# Patient Record
Sex: Male | Born: 1950 | Race: White | Hispanic: No | Marital: Married | Smoking: Never smoker
Health system: Southern US, Community
[De-identification: ages and names within clinical notes are randomized; demographics above are authoritative.]

## PROBLEM LIST (undated history)

## (undated) DIAGNOSIS — G4700 Insomnia, unspecified: Secondary | ICD-10-CM

## (undated) DIAGNOSIS — F419 Anxiety disorder, unspecified: Secondary | ICD-10-CM

## (undated) DIAGNOSIS — F329 Major depressive disorder, single episode, unspecified: Secondary | ICD-10-CM

## (undated) DIAGNOSIS — D0359 Melanoma in situ of other part of trunk: Secondary | ICD-10-CM

## (undated) DIAGNOSIS — I10 Essential (primary) hypertension: Secondary | ICD-10-CM

## (undated) DIAGNOSIS — D099 Carcinoma in situ, unspecified: Secondary | ICD-10-CM

## (undated) DIAGNOSIS — E222 Syndrome of inappropriate secretion of antidiuretic hormone: Secondary | ICD-10-CM

## (undated) DIAGNOSIS — E871 Hypo-osmolality and hyponatremia: Secondary | ICD-10-CM

## (undated) DIAGNOSIS — F32A Depression, unspecified: Secondary | ICD-10-CM

## (undated) HISTORY — DX: Depression, unspecified: F32.A

## (undated) HISTORY — DX: Syndrome of inappropriate secretion of antidiuretic hormone: E22.2

## (undated) HISTORY — PX: MOHS SURGERY: SUR867

## (undated) HISTORY — DX: Anxiety disorder, unspecified: F41.9

## (undated) HISTORY — DX: Hypo-osmolality and hyponatremia: E87.1

## (undated) HISTORY — DX: Essential (primary) hypertension: I10

## (undated) HISTORY — DX: Carcinoma in situ, unspecified: D09.9

## (undated) HISTORY — DX: Melanoma in situ of other part of trunk: D03.59

## (undated) HISTORY — PX: SHOULDER SURGERY: SHX246

## (undated) HISTORY — DX: Major depressive disorder, single episode, unspecified: F32.9

## (undated) HISTORY — DX: Insomnia, unspecified: G47.00

---

## 2004-08-05 ENCOUNTER — Ambulatory Visit (HOSPITAL_COMMUNITY): Admission: RE | Admit: 2004-08-05 | Discharge: 2004-08-05 | Payer: Self-pay | Admitting: Gastroenterology

## 2004-08-05 ENCOUNTER — Encounter (INDEPENDENT_AMBULATORY_CARE_PROVIDER_SITE_OTHER): Payer: Self-pay | Admitting: Specialist

## 2006-09-04 ENCOUNTER — Ambulatory Visit: Payer: Self-pay | Admitting: Internal Medicine

## 2008-09-08 ENCOUNTER — Telehealth (INDEPENDENT_AMBULATORY_CARE_PROVIDER_SITE_OTHER): Payer: Self-pay | Admitting: *Deleted

## 2008-09-08 ENCOUNTER — Ambulatory Visit: Payer: Self-pay | Admitting: Internal Medicine

## 2008-09-08 DIAGNOSIS — G4700 Insomnia, unspecified: Secondary | ICD-10-CM | POA: Insufficient documentation

## 2008-10-29 ENCOUNTER — Telehealth (INDEPENDENT_AMBULATORY_CARE_PROVIDER_SITE_OTHER): Payer: Self-pay | Admitting: *Deleted

## 2008-12-14 ENCOUNTER — Telehealth (INDEPENDENT_AMBULATORY_CARE_PROVIDER_SITE_OTHER): Payer: Self-pay | Admitting: *Deleted

## 2008-12-30 ENCOUNTER — Ambulatory Visit: Payer: Self-pay | Admitting: Internal Medicine

## 2008-12-30 DIAGNOSIS — L0292 Furuncle, unspecified: Secondary | ICD-10-CM | POA: Insufficient documentation

## 2008-12-30 DIAGNOSIS — L0293 Carbuncle, unspecified: Secondary | ICD-10-CM

## 2009-01-04 ENCOUNTER — Ambulatory Visit: Payer: Self-pay | Admitting: Internal Medicine

## 2009-01-04 DIAGNOSIS — I10 Essential (primary) hypertension: Secondary | ICD-10-CM | POA: Insufficient documentation

## 2009-01-05 ENCOUNTER — Telehealth (INDEPENDENT_AMBULATORY_CARE_PROVIDER_SITE_OTHER): Payer: Self-pay | Admitting: *Deleted

## 2009-01-05 LAB — CONVERTED CEMR LAB
Basophils Absolute: 0 10*3/uL (ref 0.0–0.1)
CO2: 30 meq/L (ref 19–32)
Calcium: 9.5 mg/dL (ref 8.4–10.5)
GFR calc Af Amer: 111 mL/min
GFR calc non Af Amer: 92 mL/min
Hemoglobin: 13.3 g/dL (ref 13.0–17.0)
Lymphocytes Relative: 20.8 % (ref 12.0–46.0)
MCHC: 34.3 g/dL (ref 30.0–36.0)
Neutro Abs: 4.5 10*3/uL (ref 1.4–7.7)
RDW: 12.5 % (ref 11.5–14.6)
Sodium: 134 meq/L — ABNORMAL LOW (ref 135–145)
TSH: 1.04 microintl units/mL (ref 0.35–5.50)

## 2009-01-18 ENCOUNTER — Encounter: Payer: Self-pay | Admitting: Internal Medicine

## 2009-01-29 ENCOUNTER — Encounter (INDEPENDENT_AMBULATORY_CARE_PROVIDER_SITE_OTHER): Payer: Self-pay | Admitting: *Deleted

## 2009-01-29 ENCOUNTER — Ambulatory Visit: Payer: Self-pay | Admitting: Internal Medicine

## 2009-01-30 LAB — CONVERTED CEMR LAB
BUN: 13 mg/dL (ref 6–23)
CO2: 31 meq/L (ref 19–32)
Chloride: 96 meq/L (ref 96–112)
Cholesterol: 181 mg/dL (ref 0–200)
Glucose, Bld: 107 mg/dL — ABNORMAL HIGH (ref 70–99)
PSA: 1.13 ng/mL (ref 0.10–4.00)
Potassium: 4.9 meq/L (ref 3.5–5.1)
VLDL: 10 mg/dL (ref 0–40)

## 2009-02-01 ENCOUNTER — Encounter (INDEPENDENT_AMBULATORY_CARE_PROVIDER_SITE_OTHER): Payer: Self-pay | Admitting: *Deleted

## 2009-02-03 ENCOUNTER — Telehealth: Payer: Self-pay | Admitting: Internal Medicine

## 2009-02-08 ENCOUNTER — Encounter (INDEPENDENT_AMBULATORY_CARE_PROVIDER_SITE_OTHER): Payer: Self-pay | Admitting: *Deleted

## 2009-02-24 ENCOUNTER — Telehealth (INDEPENDENT_AMBULATORY_CARE_PROVIDER_SITE_OTHER): Payer: Self-pay | Admitting: *Deleted

## 2009-03-29 ENCOUNTER — Telehealth (INDEPENDENT_AMBULATORY_CARE_PROVIDER_SITE_OTHER): Payer: Self-pay | Admitting: *Deleted

## 2009-04-01 ENCOUNTER — Ambulatory Visit: Payer: Self-pay | Admitting: Internal Medicine

## 2009-04-01 DIAGNOSIS — F411 Generalized anxiety disorder: Secondary | ICD-10-CM | POA: Insufficient documentation

## 2009-04-27 ENCOUNTER — Ambulatory Visit: Payer: Self-pay | Admitting: Internal Medicine

## 2009-05-05 ENCOUNTER — Telehealth: Payer: Self-pay | Admitting: Internal Medicine

## 2009-06-30 ENCOUNTER — Ambulatory Visit: Payer: Self-pay | Admitting: Internal Medicine

## 2009-08-27 ENCOUNTER — Encounter: Payer: Self-pay | Admitting: Internal Medicine

## 2009-09-06 ENCOUNTER — Telehealth (INDEPENDENT_AMBULATORY_CARE_PROVIDER_SITE_OTHER): Payer: Self-pay | Admitting: *Deleted

## 2009-09-07 ENCOUNTER — Encounter: Payer: Self-pay | Admitting: Internal Medicine

## 2009-12-23 ENCOUNTER — Telehealth (INDEPENDENT_AMBULATORY_CARE_PROVIDER_SITE_OTHER): Payer: Self-pay | Admitting: *Deleted

## 2009-12-24 ENCOUNTER — Encounter (INDEPENDENT_AMBULATORY_CARE_PROVIDER_SITE_OTHER): Payer: Self-pay | Admitting: *Deleted

## 2010-01-24 ENCOUNTER — Encounter (INDEPENDENT_AMBULATORY_CARE_PROVIDER_SITE_OTHER): Payer: Self-pay | Admitting: *Deleted

## 2010-02-11 ENCOUNTER — Ambulatory Visit: Payer: Self-pay | Admitting: Internal Medicine

## 2010-02-14 LAB — CONVERTED CEMR LAB
ALT: 21 units/L (ref 0–53)
Albumin: 4.1 g/dL (ref 3.5–5.2)
Alkaline Phosphatase: 51 units/L (ref 39–117)
Basophils Relative: 0.7 % (ref 0.0–3.0)
CO2: 32 meq/L (ref 19–32)
Chloride: 98 meq/L (ref 96–112)
Eosinophils Absolute: 0.1 10*3/uL (ref 0.0–0.7)
Eosinophils Relative: 2.5 % (ref 0.0–5.0)
HDL: 71.5 mg/dL (ref >39.00)
Hemoglobin: 12.6 g/dL — ABNORMAL LOW (ref 13.0–17.0)
Lymphocytes Relative: 34.1 % (ref 12.0–46.0)
MCHC: 33.6 g/dL (ref 30.0–36.0)
MCV: 95.5 fL (ref 78.0–100.0)
Monocytes Absolute: 0.5 10*3/uL (ref 0.1–1.0)
Neutro Abs: 2.5 10*3/uL (ref 1.4–7.7)
RBC: 3.93 M/uL — ABNORMAL LOW (ref 4.22–5.81)
Sodium: 135 meq/L (ref 135–145)
TSH: 1.46 microintl units/mL (ref 0.35–5.50)
Total Protein: 6.6 g/dL (ref 6.0–8.3)

## 2010-02-15 ENCOUNTER — Ambulatory Visit: Payer: Self-pay | Admitting: Internal Medicine

## 2010-02-22 ENCOUNTER — Ambulatory Visit: Payer: Self-pay | Admitting: Internal Medicine

## 2010-02-24 LAB — CONVERTED CEMR LAB
Ferritin: 82.1 ng/mL (ref 22.0–322.0)
Iron: 92 ug/dL (ref 42–165)
Vitamin B-12: 547 pg/mL (ref 211–911)

## 2010-03-02 ENCOUNTER — Telehealth: Payer: Self-pay | Admitting: Internal Medicine

## 2010-05-06 ENCOUNTER — Telehealth (INDEPENDENT_AMBULATORY_CARE_PROVIDER_SITE_OTHER): Payer: Self-pay | Admitting: *Deleted

## 2010-07-21 ENCOUNTER — Ambulatory Visit: Payer: Self-pay | Admitting: Sports Medicine

## 2010-07-21 DIAGNOSIS — M545 Low back pain, unspecified: Secondary | ICD-10-CM | POA: Insufficient documentation

## 2010-07-21 DIAGNOSIS — M25569 Pain in unspecified knee: Secondary | ICD-10-CM | POA: Insufficient documentation

## 2010-07-21 DIAGNOSIS — M7750 Other enthesopathy of unspecified foot: Secondary | ICD-10-CM | POA: Insufficient documentation

## 2010-07-21 DIAGNOSIS — M25579 Pain in unspecified ankle and joints of unspecified foot: Secondary | ICD-10-CM | POA: Insufficient documentation

## 2010-08-18 ENCOUNTER — Ambulatory Visit: Payer: Self-pay | Admitting: Sports Medicine

## 2010-08-18 DIAGNOSIS — M4120 Other idiopathic scoliosis, site unspecified: Secondary | ICD-10-CM | POA: Insufficient documentation

## 2010-10-05 ENCOUNTER — Telehealth: Payer: Self-pay | Admitting: Internal Medicine

## 2010-11-04 ENCOUNTER — Telehealth: Payer: Self-pay | Admitting: Internal Medicine

## 2010-12-21 NOTE — Progress Notes (Signed)
Summary: Refill  Phone Note Refill Request Message from:  Fax from Pharmacy on October 05, 2010 4:11 PM  Refills Requested: Medication #1:  AMBIEN 10 MG TABS 1 at bedtime as needed [facility] - fax [phone] --- tel [phone]  Next Appointment Scheduled: none Initial call taken by: Okey Regal Spring,  October 05, 2010 4:12 PM  Follow-up for Phone Call        #30, NR Follow-up by: Marga Melnick MD,  October 06, 2010 2:26 PM    Prescriptions: AMBIEN 10 MG TABS (ZOLPIDEM TARTRATE) 1 at bedtime as needed  #30 x 0   Entered by:   Lucious Groves CMA   Authorized by:   Marga Melnick MD   Signed by:   Lucious Groves CMA on 10/06/2010   Method used:   Telephoned to ...       [facility] [phone]* (retail)       [address]       [place], [place]  [address]       Ph: [phone]       Fax: [phone]   RxID:   [id number]

## 2010-12-21 NOTE — Letter (Signed)
Summary: Primary Care Appointment Letter  [facility] [place]  [address] [place], [place] [address]   Phone: [phone]  Fax: [phone]    01/24/2010 MRN: [id number]  Bobby [name] [address] [place], [place]  [address]  Dear [name],   Your Primary Care Physician Delphos E. Paz MD has indicated that:    ___X____it is time to schedule an appointment FOR CPX AND FASTING LABS PER LAST OFFICE VISIT W/ DR. PAZ    _______you missed your appointment on______ and need to call and          reschedule.    _______you need to have lab work done.    _______you need to schedule an appointment discuss lab or test results.    _______you need to call to reschedule your appointment that is                       scheduled on _________.     Please call our office as soon as possible. Our phone number is [phone]____. Our office is open 8a-5p, Monday through Friday.     Thank you,    [facility]uler

## 2010-12-21 NOTE — Assessment & Plan Note (Signed)
Summary: NP,R KNEE,BACK PAIN,MC   Vital Signs:  Patient profile:   60 year old male BP sitting:   158 / 98  Vitals Entered By: Lillia Pauls CMA (July 21, 2010 3:17 PM)  History of Present Illness: 1. Low back pain - Threw it out about 1 year ago and since then has had pain off and on. - Has a leg length discrepancy which is corrected with a lift - Pain is in the lower back - Pain currently rated a 2/10 - Has been doing strengthening exercises  ROS: denies pain radiating down the legs, denies saddle anesthesia or loss of bowel / bladder dysfunction  2. Right knee pain: - Off and on for the past 4 months - Located in the lateral aspect of his right knee - Only happens after he wears different shoes with an arch support - Denies any current pain  ROS: denies left knee pain or medial knee pain  3. Left ankle injury - Been there for a couple of weeks - Is getting better - Doesn't remember injuring it but just noticed it swelling and bruised    Current Medications (verified): 1)  Ambien 10 Mg Tabs (Zolpidem Tartrate) .Marland Kitchen.. 1 At Bedtime As Needed 2)  Carvedilol 6.25 Mg Tabs (Carvedilol) .Marland Kitchen.. 1 By Mouth Two Times A Day  Allergies: No Known Drug Allergies  Social History: Reviewed history from 02/11/2010 and no changes required. 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  Well appearing, no acute distress Msk:  Lumbar Back:  No swelling or deformity.  Full ROM.  Non tender to palpation  Right knee:  No swelling, redness, or warmth.  Full ROM.  Good stability.  Negative McMurrays.  Left ankle:  some dependent bruising near the heel.  No swelling, redness, or warmth.  Good stability.  Miminally TTP over deltoid ligament  Feet:  Transverse arches collapsed bilaterally.  Longitudinal archers also  collapsed.    Ankle valgus bilaterally.  No definite leg length discrepancy identified.  If anything his left leg may be longer than the right  Back: right angled moderate scoliosis  Core:  Weak leg abductors bilaterally    Impression & Recommendations:  Problem # 1:  BACK PAIN, LUMBAR (ICD-724.2) Assessment New Chronic lumbosacral sprain.  Likely related to scoliosis, incorrect use of a left heal lift, and weak abductors.  Conservative management.  D/C the use of the heel lift.  Recommended strengthening exercises for abductors and lower back.  Sent pt home with temporary inserts with metatarsal pads.  Will use these as a trial to see if he would benefit from custom orthotics that corrected transverse arches.  reck 1 month  Problem # 2:  KNEE PAIN, RIGHT (ICD-719.46) Assessment: New knee strain from hip / back rotation and abnormal gait.  Will work on strengthening exercises and metatarsal pad inserts.  monitor abduction strength  Problem # 3:  ANKLE PAIN, LEFT (ICD-719.47) Assessment: New Ankle sprain.  It is improving.  Continue conservative management.  Complete Medication List: 1)  Ambien 10 Mg Tabs (Zolpidem tartrate) .Marland Kitchen.. 1 at bedtime as needed 2)  Carvedilol 6.25 Mg Tabs (Carvedilol) .Marland Kitchen.. 1 by mouth two times a day  Other Orders: Sports Insoles [phone])

## 2010-12-21 NOTE — Assessment & Plan Note (Signed)
Summary: cpx//lch   Vital Signs:  Patient profile:   60 year old male Height:      72 inches Weight:      173.8 pounds BMI:     23.66 Pulse rate:   60 / minute BP sitting:   110 / 60  Vitals Entered By: Shary Decamp (February 11, 2010 9:34 AM) CC: cpx - fasting   History of Present Illness: CPX  Preventive Screening-Counseling & Management  Caffeine-Diet-Exercise     Caffeine use/day: 1     Times/week: 4      Drug Use:  no.    Allergies: No Known Drug Allergies  Past History:  Past Medical History: Reviewed history from 04/01/2009 and no changes required. 12-2008: ELEVATED BLOOD PRESSURE WITHOUT DIAGNOSIS OF HYPERTENSION   INSOMNIA  Anxiety  Past Surgical History: Reviewed history from 01/29/2009 and no changes required. shoulder surgery 1980s --R--  Family History: Reviewed history from 09/08/2008 and no changes required. CAD - no HTN - no DM - M (late onset) stroke - M (TIA - 60 y/o) colon Ca - no prostate Ca - no  Social History: Reviewed history from 01/29/2009 and no changes required. 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 CV:  Denies chest pain or discomfort and swelling of feet. Resp:  Denies cough and shortness of breath. GI:  Denies bloody stools, nausea, and vomiting. GU:  Denies hematuria, urinary frequency, and urinary hesitancy. Psych:  Denies anxiety and depression.  Physical Exam  General:  alert, well-developed, and well-nourished.   Neck:  no masses, no thyromegaly, and normal carotid upstroke.   Lungs:  normal respiratory effort, no intercostal retractions, no accessory muscle use, and normal breath sounds.   Heart:  normal rate, regular rhythm, and no murmur.   Abdomen:  soft, non-tender, no distention, no  masses, no guarding, and no rigidity.   Rectal:  No external abnormalities noted. Normal sphincter tone. No rectal masses or tenderness. Prostate:  Prostate gland firm and smooth, no enlargement, nodularity, tenderness, mass, asymmetry or induration. Extremities:  no pretibial edema bilaterally  Psych:  Cognition and judgment appear intact. Alert and cooperative with normal attention span and concentration. not anxious appearing and not depressed appearing.     Impression & Recommendations:  Problem # 1:  HEALTH SCREENING (ICD-V70.0)  Td 2010 had a Cscope aprox 2007, Dr Ewing Schlein  ---->  repeated Cscope 08-2009 ---->  next in 2015 cont healthy life style sees dentist and dermatology routunely     Orders: Venipuncture ([address]) TLB-BMP (Basic Metabolic Panel-BMET) (80048-METABOL) TLB-CBC Platelet - w/Differential (85025-CBCD) TLB-Hepatic/Liver Function Pnl (80076-HEPATIC) TLB-Lipid Panel (80061-LIPID) TLB-TSH (Thyroid Stimulating Hormone) (84443-TSH) TLB-PSA (Prostate Specific Antigen) (84153-PSA)  Complete Medication List: 1)  Ambien 10 Mg Tabs (Zolpidem tartrate) .Marland Kitchen.. 1 at bedtime as needed 2)  Carvedilol 6.25 Mg Tabs (Carvedilol) .Marland Kitchen.. 1 by mouth two times a day  Patient Instructions: 1)  Please schedule a follow-up appointment in 1 year 2)  Check your blood pressure 2 or 3 times a month. If it is more than 140/85 consistently,please let us know    Preventive Care Screening  Prior Values:    PSA:  1.13 (01/29/2009)    Colonoscopy:  hemorrhoids, divertic, polyps (tubular adenoma) - no high grade dysplasia (08/27/2009)    Last Tetanus  Booster:  Tdap (01/29/2009)    Risk Factors:  Drug use:  no Caffeine use:  1 drinks per day    Comments:  3x/wk Exercise:  yes    Times per week:  4

## 2010-12-21 NOTE — Assessment & Plan Note (Signed)
Summary: F/U [facility]   Vital Signs:  Patient profile:   60 year old male Pulse rate:   52 / minute BP sitting:   136 / 90  (left arm) CC: f/u back pain- 50% improved   CC:  f/u back pain- 50% improved.  History of Present Illness: Patient returns for follow up of low back pain which he feels is 50% improved.   Taking 1-2 aleve per day Doing therapeutic exercises daily. note we took lift out of shoe since his leg length diff was compensated by scoliosis  using sports insoles w MT pads foot and ankle pain less knee pain less has started back some walking and would like to go back to doing some weights  brings in old orthotics for me to review  Allergies: No Known Drug Allergies  Physical Exam  General:  Well-developed,well-nourished,in no acute distress; alert,appropriate and cooperative throughout examination Msk:  gait is well balanced with lift removed  head and shoulders level pelvis show elevation of hemipelvis w balance by back curve  good knee to chest SLR no pain with these  MT pads placed into dress shoes and these make them more comfotable   Impression & Recommendations:  Problem # 1:  BACK PAIN, LUMBAR (ICD-724.2)  His updated medication list for this problem includes:    Tramadol Hcl 50 Mg Tabs (Tramadol hcl) .Marland Kitchen... 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 1 at bedtime as needed 2)  Carvedilol 6.25 Mg Tabs (Carvedilol) .Marland Kitchen.. 1 by mouth two times a day 3)  Tramadol Hcl 50 Mg Tabs (Tramadol hcl) .Marland Kitchen.. 1 by mouth  qid as needed pain back  Patient Instructions: 1)  Upper body pick 3 simple bar exercises 2)  rotations  3)  lateral dips/ bends 4)  forward dips - RT elbow to left knee and vice versa 5)  Keep up 3 to 4 exercises for lower back that feel best 6)  as you move to weights try to lift with good back position or back support 7)  Don't correct the leg length difference as it affects the scoliiosis 8)  develop a walking program with use of Metatarsal pads 9)  see me as needed Prescriptions: TRAMADOL HCL 50 MG TABS (TRAMADOL HCL) 1 by mouth qid as needed pain back  #100 x 2   Entered and Authorized by:   Enid Baas MD   Signed by:   Enid Baas MD on 08/18/2010   Method used:   Electronically to        [facility] [phone]* (retail)       [address]       [place], [place]  [address]       Ph: [phone]       Fax: [phone]   RxID:   [id number]

## 2010-12-21 NOTE — Progress Notes (Signed)
Summary: NEEDS NOTE THAT SERTRALINE WAS DISCONTINUED  Phone Note Call from Patient Call back at [phone]   Caller: Patient Summary of Call: CAME IN TO LEAVE STACIA A NOTE---NEEDS BRIEF STATEMENT FOR THE INSURANCE COMPANY (LOOKING AT A HOUSE) THAT HE IS NOT TAKING SERTRALINE HCL (SINCE INITIAL 30 DAY PRESCRIPTION)  HE SAYS IT PROVED TO BE UNNECESSARY --WILL TAKE HANDWRITTEN NOTE TO STACIA IN PLASTIC SLEEVE  PLEASE CALL HIM WHEN NOTE IS READY FOR PICKUP Initial call taken by: Jerolyn Shin,  December 23, 2009 12:57 PM  Follow-up for Phone Call        letter written - pt aware ready for pick up  Follow-up by: Shary Decamp,  December 24, 2009 9:02 AM

## 2010-12-21 NOTE — Letter (Signed)
Summary: Generic Letter  [facility] [place]  [address] [place], [place] [address]   Phone: [phone]  Fax: [phone]    12/24/2009  Bobby [name] [address] [place], [place]  [address]   To Whom It May Concern:   Patient was prescribed Sertraline 03/2009.  He only took for a 30day period.  After that, it was unnecessary for pt to continue taking the medication.  He is NOT currently on Sertraline.    Sincerely,    Willow Ora, MD  Appended Document: Generic Letter pt never picked up letter

## 2010-12-21 NOTE — Progress Notes (Signed)
Summary: REFILL FOR CARVEDILOL  Phone Note Call from Patient Call back at [facility] Phone [phone]   Caller: Patient Summary of Call: NEEDS REFILL FOR CARVEDILOL 6.25----[facility] [address]  PATIENT WILL TAKE LAST PILL TODAY!!!! Initial call taken by: Jerolyn Shin,  May 06, 2010 10:40 AM  Follow-up for Phone Call        done.................Marland KitchenFelecia Deloach CMA  May 06, 2010 1:16 PM

## 2010-12-21 NOTE — Progress Notes (Signed)
Summary: REFILL  Phone Note Refill Request Message from:  Fax from Pharmacy on March 02, 2010 11:25 AM  Refills Requested: Medication #1:  AMBIEN 10 MG TABS 1 at bedtime as needed [facility] [phone]   Method Requested: Fax to Local Pharmacy Next Appointment Scheduled: NO APPT Initial call taken by: Barb Merino,  March 02, 2010 11:25 AM  Follow-up for Phone Call        cpx 02/15/10 last filled #30 with 3 refills on 10/04/10 [facility]  March 02, 2010 11:37 AM   Additional Follow-up for Phone Call Additional follow up Details #1::        ok 30 and 6RF Additional Follow-up by: [facility] E. Paz MD,  March 02, 2010 11:55 AM    Prescriptions: AMBIEN 10 MG TABS (ZOLPIDEM TARTRATE) 1 at bedtime as needed  #30 x 6   Entered by:   Shary Decamp   Authorized by:   Nolon Rod. Paz MD   Signed by:   Shary Decamp on 03/02/2010   Method used:   Printed then faxed to ...       [facility] [phone]* (retail)       [address]       [place], [place]  [address]       Ph: [phone]       Fax: [phone]   RxID:   [id number]

## 2010-12-22 NOTE — Progress Notes (Signed)
Summary: Zolpidem refill  Phone Note Refill Request Message from:  Fax from Pharmacy on November 04, 2010 12:01 PM  Refills Requested: Medication #1:  AMBIEN 10 MG TABS 1 at bedtime as needed   Last Refilled: [facility]06/2010 [id number]  [address] [place], [place]  phone [phone], fax [phone]   qty = 30  Next Appointment Scheduled: none Initial call taken by: Jerolyn Shin,  November 04, 2010 12:04 PM  Follow-up for Phone Call        last filled 10/06/10. Army Fossa CMA  November 04, 2010 1:37 PM   Additional Follow-up for Phone Call Additional follow up Details #1::        ok 30 and 6 RF Lashanda Storlie E. Kyndell Zeiser MD  November 04, 2010 2:05 PM     Prescriptions: AMBIEN 10 MG TABS (ZOLPIDEM TARTRATE) 1 at bedtime as needed  #30 x 6   Entered by:   Army Fossa CMA   Authorized by:   Nolon Rod. Lannis Lichtenwalner MD   Signed by:   Army Fossa CMA on 11/04/2010   Method used:   Printed then faxed to ...       [facility] [phone]* (retail)       [address]       [place], [place]  [address]       Ph: [phone]       Fax: [phone]   RxID:   [id number]

## 2011-02-02 ENCOUNTER — Other Ambulatory Visit: Payer: Self-pay | Admitting: Internal Medicine

## 2011-02-02 ENCOUNTER — Encounter (INDEPENDENT_AMBULATORY_CARE_PROVIDER_SITE_OTHER): Payer: 59 | Admitting: Internal Medicine

## 2011-02-02 ENCOUNTER — Encounter: Payer: Self-pay | Admitting: Internal Medicine

## 2011-02-02 DIAGNOSIS — Z0000 Encounter for general adult medical examination without abnormal findings: Secondary | ICD-10-CM

## 2011-02-02 LAB — HEPATIC FUNCTION PANEL
ALT: 23 U/L (ref 0–53)
Total Bilirubin: 0.9 mg/dL (ref 0.3–1.2)
Total Protein: 6.5 g/dL (ref 6.0–8.3)

## 2011-02-02 LAB — CBC WITH DIFFERENTIAL/PLATELET
Basophils Relative: 0.6 % (ref 0.0–3.0)
Eosinophils Absolute: 0.2 10*3/uL (ref 0.0–0.7)
Eosinophils Relative: 3 % (ref 0.0–5.0)
Hemoglobin: 13.1 g/dL (ref 13.0–17.0)
MCHC: 35 g/dL (ref 30.0–36.0)
MCV: 93.4 fl (ref 78.0–100.0)
Monocytes Absolute: 0.7 10*3/uL (ref 0.1–1.0)
Neutro Abs: 3.6 10*3/uL (ref 1.4–7.7)
Neutrophils Relative %: 56.2 % (ref 43.0–77.0)
RBC: 4 Mil/uL — ABNORMAL LOW (ref 4.22–5.81)
WBC: 6.5 10*3/uL (ref 4.5–10.5)

## 2011-02-02 LAB — LIPID PANEL
HDL: 76.2 mg/dL (ref >39.00)
LDL Cholesterol: 87 mg/dL (ref 0–99)
Total CHOL/HDL Ratio: 2
Triglycerides: 40 mg/dL (ref 0.0–149.0)

## 2011-02-02 LAB — BASIC METABOLIC PANEL
BUN: 23 mg/dL (ref 6–23)
Calcium: 9 mg/dL (ref 8.4–10.5)
Creatinine, Ser: 1 mg/dL (ref 0.4–1.5)
GFR: 80.52 mL/min (ref >60.00)
Glucose, Bld: 89 mg/dL (ref 70–99)
Potassium: 4.7 mEq/L (ref 3.5–5.1)

## 2011-02-02 LAB — TSH: TSH: 1.55 u[IU]/mL (ref 0.35–5.50)

## 2011-02-07 NOTE — Assessment & Plan Note (Signed)
Summary: cpe/fasting/insurance ok'd early cpe/kn   Vital Signs:  Patient profile:   60 year old male Height:      72 inches Weight:      173.25 pounds BMI:     23.58 Pulse rate:   62 / minute Pulse rhythm:   regular BP sitting:   122 / 86  (left arm) Cuff size:   large  Vitals Entered By: Army Fossa CMA (February 02, 2011 10:01 AM) CC: CPX, fasting  Comments no concerns [facility] [place]    History of Present Illness:  complete physical exam doing great  has some  back pain for which he saw a sports medicine   doing  self physical therapy which helps.  Review of systems  Ambien helps well with insomnia  ambulatory blood pressures are okay Denies chest pain or shortness of breath  no nausea, vomiting, diarrhea. No blood in the stools No anxiety  or depression No dysuria or gross hematuria. No difficulty urinating  Preventive Screening-Counseling & Management  Caffeine-Diet-Exercise     Type of exercise: minimum     Times/week: 0  Current Medications (verified): 1)  Ambien 10 Mg Tabs (Zolpidem Tartrate) .Marland Kitchen.. 1 At Bedtime As Needed 2)  Carvedilol 6.25 Mg Tabs (Carvedilol) .Marland Kitchen.. 1 By Mouth Two Times A Day  Allergies (verified): 1)  ! * Tramadol  Past History:  Past Medical History: Reviewed history from 04/01/2009 and no changes required. 12-2008: ELEVATED BLOOD PRESSURE WITHOUT DIAGNOSIS OF HYPERTENSION   INSOMNIA  Anxiety  Past Surgical History: Reviewed history from 01/29/2009 and no changes required. shoulder surgery 1980s --R--  Social History: "[name]" Married 2 children mom passed away 07/01/09Occupation:  independent  tobacco--never ETOH-- socially exercise-- still active , limited by back pain diet-- healthy  Physical Exam  General:  alert, well-developed, and well-nourished.   Neck:  no masses, no thyromegaly, and normal carotid upstroke.   Lungs:  normal respiratory effort, no intercostal retractions, no accessory muscle use, and normal  breath sounds.   Heart:  normal rate, regular rhythm, and no murmur.   Abdomen:  soft, non-tender, no distention, no masses, no guarding, and no rigidity.   Rectal:  No external abnormalities noted. Normal sphincter tone. No rectal masses or tenderness. Prostate:  Prostate gland firm and smooth, no enlargement, nodularity, tenderness, mass, asymmetry or induration. Extremities:  no pretibial edema bilaterally  Psych:  Cognition and judgment appear intact. Alert and cooperative with normal attention span and concentration.  not anxious appearing and not depressed appearing.     Impression & Recommendations:  Problem # 1:  HEALTH SCREENING (ICD-V70.0)  Td 2010 had a Cscope aprox 2007, Dr Ewing Schlein  ---->  repeated Cscope 08-2009 ---->  next in 2015 cont healthy life style sees dentist and dermatology routunely    Orders: Venipuncture ([address]) TLB-BMP (Basic Metabolic Panel-BMET) (80048-METABOL) TLB-CBC Platelet - w/Differential (85025-CBCD) TLB-Lipid Panel (80061-LIPID) TLB-TSH (Thyroid Stimulating Hormone) (84443-TSH) TLB-PSA (Prostate Specific Antigen) (84153-PSA) TLB-Hepatic/Liver Function Pnl (80076-HEPATIC) Specimen Handling ([address])  Complete Medication List: 1)  Ambien 10 Mg Tabs (Zolpidem tartrate) .Marland Kitchen.. 1 at bedtime as needed 2)  Carvedilol 6.25 Mg Tabs (Carvedilol) .Marland Kitchen.. 1 by mouth two times a day Prescriptions: AMBIEN 10 MG TABS (ZOLPIDEM TARTRATE) 1 at bedtime as needed  #30 x 6   Entered and Authorized by:   Elita Quick E. Lanyiah Brix MD   Signed by:   Nolon Rod. Ameri Cahoon MD on 02/02/2011   Method used:   Print then Give to Patient  RxID:   [id number] CARVEDILOL 6.25 MG TABS (CARVEDILOL) 1 by mouth two times a day  #180 Tablet x 3   Entered and Authorized by:   Nolon Rod. Haynes Giannotti MD   Signed by:   Nolon Rod. Siera Beyersdorf MD on 02/02/2011   Method used:   Electronically to        [facility] [phone]* (retail)       [address]       [place], [place]  [address]       Ph:  [phone]       Fax: [phone]   RxID:   [id number]    Orders Added: 1)  Venipuncture [[address]] 2)  TLB-BMP (Basic Metabolic Panel-BMET) [80048-METABOL] 3)  TLB-CBC Platelet - w/Differential [85025-CBCD] 4)  TLB-Lipid Panel [80061-LIPID] 5)  TLB-TSH (Thyroid Stimulating Hormone) [84443-TSH] 6)  TLB-PSA (Prostate Specific Antigen) [84153-PSA] 7)  TLB-Hepatic/Liver Function Pnl [80076-HEPATIC] 8)  Specimen Handling [99000] 9)  Est. Patient age 46-64 [27]     Risk Factors:  Alcohol use:  no Exercise:  yes    Times per week:  0    Type:  minimum

## 2011-04-07 NOTE — Op Note (Signed)
NAME:  LORENZ, DONLEY NO.:  [id number]   MEDICAL RECORD NO.:  [id number]                   PATIENT TYPE:  AMB   LOCATION:  ENDO                                 FACILITY[facility]  [facility]   PHYSICIAN:  Petra Kuba, M.D.                 DATE OF BIRTH:  25-Nov-1950   DATE OF PROCEDURE:  08/05/2004  DATE OF DISCHARGE:                                 OPERATIVE REPORT   PROCEDURE:  Colonoscopy.   INDICATIONS:  Screening.   Consent was signed after risks, benefits, methods, options thoroughly  discussed in the office.   MEDICINES USED:  Demerol 50, Versed 6.   PROCEDURE:  Rectal inspection is pertinent for external hemorrhoids, small.  Digital exam was negative.  The video pediatric adjustable colonoscope was  inserted and easily advanced around the colon to the cecum.  This did  require abdominal pressure but no position changes.  On insertion, a rectal  small pedunculated polyp was seen as well as left-sided diverticula but no  other abnormality.  The cecum was identified by the appendiceal orifice and  the ileocecal valve.  The scope was inserted shortways into the terminal  ileum, which was normal.  Photo documentation was obtained.  The scope was  slowly withdrawn.  The prep was adequate.  There was some liquid stool that  required washing and suctioning.  On slow withdrawal through the colon, the  right side was normal.  Specifically, the cecum, ascending, and transverse  were normal.  The scope was withdrawn around the left side of the colon,  scattered diverticula were seen.  No polypoid lesions were seen until we  withdrew back to the rectum and saw the polyp that was seen on insertion,  which was snared, electrocautery applied, and polyp was suctioned through  the scope and collected into the trap.  There was a nice, white coagulum  remaining without any obvious residual polypoid tissue.  Anorectal pull-  through and retroflexion confirmed some small  hemorrhoids.  The scope was  straightened and readvanced shortways up the left side of the colon.  Air  was suctioned, the scope removed.  The patient tolerated the procedure well.  There were no obvious immediate complications.   ENDOSCOPIC DIAGNOSIS:  1.  Internal/external hemorrhoids.  2.  Left-sided diverticula.  3.  Small rectal pedunculated polyps, snared.  4.  Otherwise within normal limits to the terminal ileum.   PLAN:  Await pathology to determine future colonic screening.  Happy to see  back p.r.n.                                               Petra Kuba, M.D.    MEM/MEDQ  D:  08/05/2004  T:  08/06/2004  Job:  [id number]

## 2011-06-08 ENCOUNTER — Other Ambulatory Visit: Payer: Self-pay | Admitting: Internal Medicine

## 2011-06-08 NOTE — Telephone Encounter (Signed)
Got 30 and 3 RF 01-2011, 2 months too early

## 2011-06-12 ENCOUNTER — Other Ambulatory Visit: Payer: Self-pay | Admitting: *Deleted

## 2011-06-12 NOTE — Telephone Encounter (Signed)
Patient got 30 tablets, 6 refills on 02-02-11. It is too early for RFs

## 2011-07-06 ENCOUNTER — Ambulatory Visit (INDEPENDENT_AMBULATORY_CARE_PROVIDER_SITE_OTHER): Payer: 59 | Admitting: Family Medicine

## 2011-07-06 VITALS — BP 138/87

## 2011-07-06 DIAGNOSIS — M25569 Pain in unspecified knee: Secondary | ICD-10-CM

## 2011-07-06 NOTE — Patient Instructions (Signed)
Your exam is reassuring that you did not injury your knee joint, meniscus, ligaments.  You also don't have signs of arthritis, bursitis of your knee. Your pain on exam is likely related to the stretches you were doing for your back - you have pain in your medial calf muscle. This is treated with exercises, stretches that I showed you (calf raises with toe in, straight, toe out or on step) - hold a stretch for 20-30 seconds to get maximum effect/lengthening. Tylenol or ibuprofen as needed for pain. Heat or ice 15 minutes at a time 3-4 times a day if needed (most people use heat for muscle spasms). Calf sleeve or ACE wrap is a consideration but I don't think you need this. Your pain in this area should resolve over the next month.

## 2011-07-07 ENCOUNTER — Encounter: Payer: Self-pay | Admitting: Family Medicine

## 2011-07-08 NOTE — Progress Notes (Signed)
  Subjective:    Patient ID: Bobby [name], male    DOB: 04/30/1951, 60 y.o.   MRN: [id number]  HPI 60 yo M here for right knee pain.  Patient denies known acute injury. Runs about 1-2 miles at a time Knee pain started about 2 years ago when he bought new shoes with overpronation protection - resolved on its own though occasionally has pain in different parts of his knee. This most recent time he noticed pain in posterolateral part of knee 2 weeks ago when he reinstituted stretches for his low back - specifically a FABERs stretch seemed to bring on pain here. Evolved to now only having an occasional sharp pain medial posterior knee that will shoot through medial calf.  Lasts seconds. No catching, locking, giving out of knee.  Past Medical History  Diagnosis Date  . Hypertension   . Insomnia     No current outpatient prescriptions on file prior to visit.    Past Surgical History  Procedure Date  . Shoulder surgery 1980s    right shoulder    Allergies  Allergen Reactions  . Tramadol     REACTION: hives    History   Social History  . Marital Status: Single    Spouse Name: N/A    Number of Children: N/A  . Years of Education: N/A   Occupational History  . Not on file.   Social History Main Topics  . Smoking status: Never Smoker   . Smokeless tobacco: Not on file  . Alcohol Use: Not on file  . Drug Use: Not on file  . Sexually Active: Not on file   Other Topics Concern  . Not on file   Social History Narrative  . No narrative on file    No family history on file.  BP 138/87  Review of Systems See HPI above.    Objective:   Physical Exam Gen: NAD  R knee: No gross deformity, ecchymoses, swelling. Mild TTP medial gastroc.  No joint line, pes, post patellar facet, other TTP about knee or lower leg. FROM. Negative ant/post drawers. Negative valgus/varus testing. Negative lachmanns. Negative mcmurrays, apleys, patellar apprehension, clarkes. NV  intact distally. Able to do Calf raise with minimal pain medial gastroc.    Assessment & Plan:  1. R knee pain - Exam reassuring.  Consistent with mild calf strain/spasms.  Start exercises, stretches as directed.  Tylenol/ibuprofen as needed.  ACE wrap or calf sleeve for compression if becomes severe enough.  Not currently limiting his activities though.  F/u prn.  Discussed red flags.

## 2011-07-08 NOTE — Assessment & Plan Note (Signed)
Exam reassuring.  Consistent with mild calf strain/spasms.  Start exercises, stretches as directed.  Tylenol/ibuprofen as needed.  ACE wrap or calf sleeve for compression if becomes severe enough.  Not currently limiting his activities though.  F/u prn.  Discussed red flags.

## 2011-09-08 ENCOUNTER — Other Ambulatory Visit: Payer: Self-pay | Admitting: Internal Medicine

## 2011-09-08 MED ORDER — ZOLPIDEM TARTRATE 10 MG PO TABS: 10.0000 mg | ORAL_TABLET | Freq: Every evening | ORAL | Status: DC | PRN

## 2011-09-08 NOTE — Telephone Encounter (Signed)
Ok 30, 6 RF 

## 2011-09-08 NOTE — Telephone Encounter (Signed)
Ambien request [last refill 02/02/11 #[id number] Last OV 02/02/11]

## 2011-09-08 NOTE — Telephone Encounter (Signed)
Done

## 2011-09-13 ENCOUNTER — Other Ambulatory Visit: Payer: Self-pay

## 2011-09-13 MED ORDER — ZOLPIDEM TARTRATE 10 MG PO TABS: 10.0000 mg | ORAL_TABLET | Freq: Every evening | ORAL | Status: DC | PRN

## 2011-09-13 NOTE — Telephone Encounter (Signed)
Last filled 07/25/11. Last OV 02/02/11

## 2011-09-13 NOTE — Telephone Encounter (Signed)
Ok 30, 5 RF 

## 2011-09-14 ENCOUNTER — Telehealth: Payer: Self-pay | Admitting: *Deleted

## 2011-09-14 NOTE — Telephone Encounter (Signed)
Zolpidem request by pharmacy. Deny-duplicate 09/13/11

## 2011-09-20 MED ORDER — ZOLPIDEM TARTRATE 10 MG PO TABS: 10.0000 mg | ORAL_TABLET | Freq: Every evening | ORAL | Status: DC | PRN

## 2011-09-20 NOTE — Telephone Encounter (Signed)
Pharmacy called and message left for refill of ambien 10mg  with 5 refills [facility]

## 2012-03-06 ENCOUNTER — Encounter: Payer: Self-pay | Admitting: Internal Medicine

## 2012-03-06 ENCOUNTER — Ambulatory Visit (INDEPENDENT_AMBULATORY_CARE_PROVIDER_SITE_OTHER): Payer: 59 | Admitting: Internal Medicine

## 2012-03-06 DIAGNOSIS — Z0000 Encounter for general adult medical examination without abnormal findings: Secondary | ICD-10-CM

## 2012-03-06 DIAGNOSIS — G4700 Insomnia, unspecified: Secondary | ICD-10-CM

## 2012-03-06 DIAGNOSIS — E871 Hypo-osmolality and hyponatremia: Secondary | ICD-10-CM

## 2012-03-06 DIAGNOSIS — R030 Elevated blood-pressure reading, without diagnosis of hypertension: Secondary | ICD-10-CM

## 2012-03-06 LAB — CBC WITH DIFFERENTIAL/PLATELET
Basophils Absolute: 0 10*3/uL (ref 0.0–0.1)
Eosinophils Absolute: 0.1 10*3/uL (ref 0.0–0.7)
HCT: 37.1 % — ABNORMAL LOW (ref 39.0–52.0)
Hemoglobin: 12.7 g/dL — ABNORMAL LOW (ref 13.0–17.0)
Lymphocytes Relative: 28.6 % (ref 12.0–46.0)
Lymphs Abs: 2.1 10*3/uL (ref 0.7–4.0)
MCHC: 34.3 g/dL (ref 30.0–36.0)
Monocytes Relative: 8.8 % (ref 3.0–12.0)
Neutro Abs: 4.4 10*3/uL (ref 1.4–7.7)
Platelets: 239 10*3/uL (ref 150.0–400.0)
RDW: 13.6 % (ref 11.5–14.6)

## 2012-03-06 LAB — COMPREHENSIVE METABOLIC PANEL
ALT: 22 U/L (ref 0–53)
AST: 28 U/L (ref 0–37)
CO2: 28 mEq/L (ref 19–32)
Calcium: 8.9 mg/dL (ref 8.4–10.5)
Chloride: 96 mEq/L (ref 96–112)
Creatinine, Ser: 0.9 mg/dL (ref 0.4–1.5)
GFR: 96.79 mL/min (ref >60.00)
Sodium: 131 mEq/L — ABNORMAL LOW (ref 135–145)
Total Protein: 6.8 g/dL (ref 6.0–8.3)

## 2012-03-06 LAB — PSA: PSA: 1.57 ng/mL (ref 0.10–4.00)

## 2012-03-06 LAB — LIPID PANEL: Cholesterol: 162 mg/dL (ref 0–200)

## 2012-03-06 MED ORDER — ZOLPIDEM TARTRATE 10 MG PO TABS: 10.0000 mg | ORAL_TABLET | Freq: Every evening | ORAL | Status: DC | PRN

## 2012-03-06 MED ORDER — CARVEDILOL 6.25 MG PO TABS: 6.2500 mg | ORAL_TABLET | Freq: Two times a day (BID) | ORAL | Status: DC

## 2012-03-06 NOTE — Assessment & Plan Note (Addendum)
On Coreg,good compliance, no side effects. No change.

## 2012-03-06 NOTE — Progress Notes (Signed)
  Subjective:    Patient ID: Bobby [name], male    DOB: Nov 23, 1950, 61 y.o.   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 No blood in the stools. No anxiety- depression No dysuria, gross hematuria or difficulty urinating. Good compliance with Coreg, ambulatory BP is normal. Does take Ambien from time to time, no apparent side effects.    Objective:   Physical Exam  General:  alert, well-developed, and well-nourished.   Neck:  no thyromegaly, and normal carotid upstroke.   Lungs:  normal respiratory effort, no intercostal retractions, no accessory muscle use, and normal breath sounds.   Heart:  normal rate, regular rhythm, and no murmur.   Abdomen:  soft, non-tender, no distention, no masses, no guarding, and no rigidity.   Rectal:  No external abnormalities noted. Normal sphincter tone. No rectal masses or tenderness. Prostate:  Prostate gland firm and smooth, no enlargement, nodularity, tenderness, mass, asymmetry or induration. Extremities:  no pretibial edema bilaterally  Psych:  Cognition and judgment appear intact. Alert and cooperative with normal attention span and concentration.  not anxious appearing and not depressed appearing.       Assessment & Plan:

## 2012-03-06 NOTE — Patient Instructions (Signed)
Check the  blood pressure 2 or 3 times a month, be sure it is less than 135/85. If it is consistently higher, let me know

## 2012-03-06 NOTE — Assessment & Plan Note (Signed)
Ambien when necessary. Refill

## 2012-03-06 NOTE — Assessment & Plan Note (Addendum)
Td 2010 had a Cscope aprox 2007, Dr Ewing Schlein  ---->  repeated Cscope 08-2009 ---->  next in 2015 cont healthy life style, encouraged to remain active Labs Also he reports onychomycosis, on exam he has a small area affected in the left great toenail. Options discussed, pros and cons discussed. He will let me know if he is interested from either topical or oral medication.

## 2012-03-12 ENCOUNTER — Encounter: Payer: Self-pay | Admitting: *Deleted

## 2012-03-12 NOTE — Progress Notes (Signed)
Addended by: Edwena Felty T on: 03/12/2012 10:09 AM   Modules accepted: Orders

## 2012-03-22 ENCOUNTER — Other Ambulatory Visit: Payer: Self-pay | Admitting: Internal Medicine

## 2012-03-22 NOTE — Telephone Encounter (Signed)
Refill done. Pt needed 90 day

## 2012-08-12 ENCOUNTER — Other Ambulatory Visit: Payer: Self-pay | Admitting: Internal Medicine

## 2012-08-12 MED ORDER — ZOLPIDEM TARTRATE 10 MG PO TABS: 10.0000 mg | ORAL_TABLET | Freq: Every evening | ORAL | Status: DC | PRN

## 2012-08-12 NOTE — Telephone Encounter (Signed)
Refill done.  

## 2012-08-12 NOTE — Telephone Encounter (Signed)
refill  Zolpidem Tartrate (Tab) 10 MG Take 1 tablet (10 mg total) by mouth at bedtime as needed for sleep. --last fill 8.13.13 Last ov 4.17.13 CPE

## 2012-08-12 NOTE — Telephone Encounter (Signed)
Ok to refill 

## 2012-08-12 NOTE — Telephone Encounter (Signed)
Ok 30, 5 RF 

## 2012-09-22 ENCOUNTER — Telehealth: Payer: Self-pay | Admitting: Internal Medicine

## 2012-09-22 NOTE — Telephone Encounter (Signed)
Please arrange labs: BMP dx hyponatremia

## 2012-09-23 NOTE — Telephone Encounter (Signed)
Called 1008am 11.4.13 LM to call & sch. Labs only

## 2012-09-25 NOTE — Telephone Encounter (Signed)
Lmovm

## 2012-09-26 ENCOUNTER — Encounter: Payer: Self-pay | Admitting: Internal Medicine

## 2012-09-26 NOTE — Telephone Encounter (Signed)
Mailed letter 09/26/12.

## 2012-11-06 ENCOUNTER — Other Ambulatory Visit (INDEPENDENT_AMBULATORY_CARE_PROVIDER_SITE_OTHER): Payer: 59

## 2012-11-06 DIAGNOSIS — E871 Hypo-osmolality and hyponatremia: Secondary | ICD-10-CM

## 2012-11-06 LAB — BASIC METABOLIC PANEL
Chloride: 98 mEq/L (ref 96–112)
Creatinine, Ser: 1 mg/dL (ref 0.4–1.5)
Potassium: 4.7 mEq/L (ref 3.5–5.1)

## 2012-11-11 ENCOUNTER — Encounter: Payer: Self-pay | Admitting: *Deleted

## 2013-02-10 ENCOUNTER — Encounter: Payer: Self-pay | Admitting: Internal Medicine

## 2013-02-18 ENCOUNTER — Telehealth: Payer: Self-pay | Admitting: Internal Medicine

## 2013-02-18 NOTE — Telephone Encounter (Signed)
done

## 2013-02-18 NOTE — Telephone Encounter (Signed)
Ok to refill? Last OV 4.17.13 Pt has a future appt 4.28.14 Last filled 9.23.13

## 2013-03-14 ENCOUNTER — Encounter: Payer: Self-pay | Admitting: Lab

## 2013-03-17 ENCOUNTER — Encounter: Payer: Self-pay | Admitting: Internal Medicine

## 2013-03-17 ENCOUNTER — Ambulatory Visit (INDEPENDENT_AMBULATORY_CARE_PROVIDER_SITE_OTHER): Payer: 59 | Admitting: Internal Medicine

## 2013-03-17 VITALS — BP 138/84 | HR 62 | Temp 98.1°F | Ht 71.0 in | Wt 172.0 lb

## 2013-03-17 DIAGNOSIS — I10 Essential (primary) hypertension: Secondary | ICD-10-CM

## 2013-03-17 DIAGNOSIS — Z0000 Encounter for general adult medical examination without abnormal findings: Secondary | ICD-10-CM

## 2013-03-17 DIAGNOSIS — G4700 Insomnia, unspecified: Secondary | ICD-10-CM

## 2013-03-17 DIAGNOSIS — Z23 Encounter for immunization: Secondary | ICD-10-CM

## 2013-03-17 NOTE — Assessment & Plan Note (Signed)
On Ambien as needed 

## 2013-03-17 NOTE — Assessment & Plan Note (Signed)
Continue low-dose of coreg

## 2013-03-17 NOTE — Patient Instructions (Signed)
Check the  blood pressure 2 or 3 times a week, be sure it is between 110/60 and 140/85. If it is consistently higher or lower, let me know  

## 2013-03-17 NOTE — Assessment & Plan Note (Addendum)
Td 2010 Shingles shot discussed, will provide it today had a Cscope aprox 2007, Dr Ewing Schlein  ---->  repeated Cscope 08-2009 ---->  next in 2015 cont healthy life style, encouraged to remain active Labs  prostate slt enlarged, no sx, PSAs have been stable---> rechecking a PSA today

## 2013-03-17 NOTE — Progress Notes (Signed)
  Subjective:    Patient ID: Bobby [name], male    DOB: [birth year], 62 y.o.   MRN: [id number]  HPI Complete physical exam  Past Medical History  Diagnosis Date  . Hypertension   . Insomnia   . Anxiety    Past Surgical History  Procedure Laterality Date  . Shoulder surgery  1980s    right shoulder   History   Social History  . Marital Status: Single    Spouse Name: N/A    Number of Children: 2  . Years of Education: N/A   Occupational History  . bussines owner     Social History Main Topics  . Smoking status: Never Smoker   . Smokeless tobacco: Never Used  . Alcohol Use: Yes     Comment: socially   . Drug Use: No  . Sexually Active: Not on file   Other Topics Concern  . Not on file   Social History Narrative   "Bobby [name]", Married, 2 children   mom passed away May 01, 2008  exercise-- still active, less than before, limited by back pain   diet-- healthy            Family History  Problem Relation Age of Onset  . Hypertension Mother   . Stroke Mother   . Colon cancer Neg Hx   . Prostate cancer Neg Hx   . CAD Neg Hx       Review of Systems  Respiratory: Negative for cough and shortness of breath.   Cardiovascular: Negative for chest pain and leg swelling.  Gastrointestinal: Negative for abdominal pain and blood in stool.  Genitourinary: Negative for dysuria, hematuria and difficulty urinating.  Psychiatric/Behavioral:       Some stress but no anxiety  Good compliance with BP medication, hardly ever take his BP because readings are always normal.      Objective:   Physical Exam BP 138/84  Pulse 62  Temp(Src) 98.1 F (36.7 C) (Oral)  Ht 5\' 11"  (1.803 m)  Wt 172 lb (78.019 kg)  BMI 24 kg/m2  SpO2 99%  General -- alert, well-developed, No distress Neck --no thyromegaly , normal carotid pulse Lungs -- normal respiratory effort, no intercostal retractions, no accessory muscle use, and normal breath sounds.   Heart-- normal rate, regular rhythm,  no murmur, and no gallop.   Abdomen--soft, non-tender, no distention, no masses, no HSM, no guarding, and no rigidity.   Extremities-- no pretibial edema bilaterally Rectal-- No external abnormalities noted. Normal sphincter tone. No rectal masses or tenderness. No stools found. Prostate:  Prostate gland firm and smooth, Mild enlargement noted without nodularity or tenderness. Neurologic-- alert & oriented X3 and strength normal in all extremities. Psych-- Cognition and judgment appear intact. Alert and cooperative with normal attention span and concentration.  not anxious appearing and not depressed appearing.      Assessment & Plan:

## 2013-03-18 LAB — LIPID PANEL
Cholesterol: 164 mg/dL (ref 0–200)
LDL Cholesterol: 64 mg/dL (ref 0–99)
Triglycerides: 54 mg/dL (ref 0.0–149.0)

## 2013-03-18 LAB — URINALYSIS
Nitrite: NEGATIVE
Specific Gravity, Urine: 1.01 (ref 1.000–1.030)
Total Protein, Urine: NEGATIVE
Urine Glucose: NEGATIVE
pH: 6 (ref 5.0–8.0)

## 2013-03-18 LAB — COMPREHENSIVE METABOLIC PANEL
ALT: 20 U/L (ref 0–53)
Albumin: 4.7 g/dL (ref 3.5–5.2)
Alkaline Phosphatase: 51 U/L (ref 39–117)
CO2: 28 mEq/L (ref 19–32)
Glucose, Bld: 86 mg/dL (ref 70–99)
Potassium: 4.3 mEq/L (ref 3.5–5.1)
Sodium: 127 mEq/L — ABNORMAL LOW (ref 135–145)
Total Protein: 7 g/dL (ref 6.0–8.3)

## 2013-03-18 LAB — CBC WITH DIFFERENTIAL/PLATELET
Basophils Absolute: 0 10*3/uL (ref 0.0–0.1)
Eosinophils Absolute: 0.1 10*3/uL (ref 0.0–0.7)
Eosinophils Relative: 1.9 % (ref 0.0–5.0)
MCV: 92.5 fl (ref 78.0–100.0)
Monocytes Absolute: 0.7 10*3/uL (ref 0.1–1.0)
Neutrophils Relative %: 60.7 % (ref 43.0–77.0)
Platelets: 248 10*3/uL (ref 150.0–400.0)
RDW: 13.2 % (ref 11.5–14.6)
WBC: 6.9 10*3/uL (ref 4.5–10.5)

## 2013-03-20 DIAGNOSIS — E871 Hypo-osmolality and hyponatremia: Secondary | ICD-10-CM

## 2013-03-20 HISTORY — DX: Hypo-osmolality and hyponatremia: E87.1

## 2013-03-24 ENCOUNTER — Other Ambulatory Visit: Payer: Self-pay | Admitting: Internal Medicine

## 2013-03-24 DIAGNOSIS — E871 Hypo-osmolality and hyponatremia: Secondary | ICD-10-CM

## 2013-03-25 ENCOUNTER — Other Ambulatory Visit: Payer: Self-pay | Admitting: Internal Medicine

## 2013-03-26 NOTE — Telephone Encounter (Signed)
Refill done.  

## 2013-04-07 ENCOUNTER — Encounter: Payer: Self-pay | Admitting: Internal Medicine

## 2013-04-08 ENCOUNTER — Telehealth: Payer: Self-pay | Admitting: *Deleted

## 2013-04-08 NOTE — Telephone Encounter (Signed)
Pt wife states that Pt was given copy of labs work by Shanda Bumps and would like to know what exactly she discuss with Pt about labs. Reviewed Pt chart did not see any detail notes as to what was discuss when Pt came in to office. So I then ask Pt wife what it is that i can help her with Pt wife states that she would like toget clarification on labs and would like to know if referral had been placed and if so which provider is Pt to see. Advise Pt wife that due to HIPPA we would need to get Pt permission to disclose his health info. Pt wife states that Pt is at work and is unable to speak with me. Pt wife then states what does he have to do walk in again advise her no we just need verification that it is ok to speak with her. Informed Pt wife to have Pt give [place] a call so that we can help to resolve any confusion he may have about labs and referral. Pt wife was very upset, called ended.

## 2013-04-11 ENCOUNTER — Ambulatory Visit: Payer: 59 | Admitting: Internal Medicine

## 2013-04-12 ENCOUNTER — Emergency Department (HOSPITAL_COMMUNITY): Payer: 59

## 2013-04-12 ENCOUNTER — Encounter (HOSPITAL_COMMUNITY): Payer: Self-pay | Admitting: Emergency Medicine

## 2013-04-12 ENCOUNTER — Inpatient Hospital Stay (HOSPITAL_COMMUNITY)
Admission: EM | Admit: 2013-04-12 | Discharge: 2013-04-18 | DRG: 643 | Disposition: A | Discharge status: Home / Self Care | Payer: 59 | Attending: Internal Medicine | Admitting: Internal Medicine

## 2013-04-12 DIAGNOSIS — M545 Low back pain, unspecified: Secondary | ICD-10-CM

## 2013-04-12 DIAGNOSIS — F411 Generalized anxiety disorder: Secondary | ICD-10-CM

## 2013-04-12 DIAGNOSIS — R413 Other amnesia: Secondary | ICD-10-CM | POA: Diagnosis present

## 2013-04-12 DIAGNOSIS — M7020 Olecranon bursitis, unspecified elbow: Secondary | ICD-10-CM | POA: Diagnosis present

## 2013-04-12 DIAGNOSIS — R4182 Altered mental status, unspecified: Secondary | ICD-10-CM

## 2013-04-12 DIAGNOSIS — M4120 Other idiopathic scoliosis, site unspecified: Secondary | ICD-10-CM

## 2013-04-12 DIAGNOSIS — D72829 Elevated white blood cell count, unspecified: Secondary | ICD-10-CM

## 2013-04-12 DIAGNOSIS — G9341 Metabolic encephalopathy: Secondary | ICD-10-CM | POA: Diagnosis present

## 2013-04-12 DIAGNOSIS — I10 Essential (primary) hypertension: Secondary | ICD-10-CM

## 2013-04-12 DIAGNOSIS — IMO0002 Reserved for concepts with insufficient information to code with codable children: Secondary | ICD-10-CM | POA: Diagnosis present

## 2013-04-12 DIAGNOSIS — G4700 Insomnia, unspecified: Secondary | ICD-10-CM

## 2013-04-12 DIAGNOSIS — G3184 Mild cognitive impairment, so stated: Secondary | ICD-10-CM | POA: Diagnosis present

## 2013-04-12 DIAGNOSIS — Z0000 Encounter for general adult medical examination without abnormal findings: Secondary | ICD-10-CM

## 2013-04-12 DIAGNOSIS — E871 Hypo-osmolality and hyponatremia: Secondary | ICD-10-CM

## 2013-04-12 DIAGNOSIS — F332 Major depressive disorder, recurrent severe without psychotic features: Secondary | ICD-10-CM

## 2013-04-12 DIAGNOSIS — R410 Disorientation, unspecified: Secondary | ICD-10-CM

## 2013-04-12 DIAGNOSIS — E222 Syndrome of inappropriate secretion of antidiuretic hormone: Secondary | ICD-10-CM | POA: Diagnosis present

## 2013-04-12 DIAGNOSIS — F430 Acute stress reaction: Secondary | ICD-10-CM | POA: Diagnosis present

## 2013-04-12 DIAGNOSIS — F1010 Alcohol abuse, uncomplicated: Secondary | ICD-10-CM

## 2013-04-12 DIAGNOSIS — F29 Unspecified psychosis not due to a substance or known physiological condition: Secondary | ICD-10-CM | POA: Diagnosis present

## 2013-04-12 DIAGNOSIS — E236 Other disorders of pituitary gland: Principal | ICD-10-CM | POA: Diagnosis present

## 2013-04-12 LAB — COMPREHENSIVE METABOLIC PANEL
Alkaline Phosphatase: 57 U/L (ref 39–117)
BUN: 6 mg/dL (ref 6–23)
Calcium: 9 mg/dL (ref 8.4–10.5)
GFR calc Af Amer: >90 mL/min (ref >90)
Glucose, Bld: 111 mg/dL — ABNORMAL HIGH (ref 70–99)
Total Protein: 7 g/dL (ref 6.0–8.3)

## 2013-04-12 LAB — ETHANOL: Alcohol, Ethyl (B): 92 mg/dL — ABNORMAL HIGH (ref 0–11)

## 2013-04-12 MED ORDER — VITAMIN B-1 100 MG PO TABS
100.0000 mg | ORAL_TABLET | Freq: Once | ORAL | Status: AC
  Administered 2013-04-13: 100 mg via ORAL
  Filled 2013-04-12: qty 1

## 2013-04-12 MED ORDER — FOLIC ACID 1 MG PO TABS
1.0000 mg | ORAL_TABLET | Freq: Once | ORAL | Status: AC
  Administered 2013-04-13: 1 mg via ORAL
  Filled 2013-04-12: qty 1

## 2013-04-12 MED ORDER — SODIUM CHLORIDE 0.9 % IV BOLUS (SEPSIS)
1000.0000 mL | Freq: Once | INTRAVENOUS | Status: AC
  Administered 2013-04-13: 1000 mL via INTRAVENOUS
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
## 2013-04-13 ENCOUNTER — Inpatient Hospital Stay (HOSPITAL_COMMUNITY): Payer: 59

## 2013-04-13 DIAGNOSIS — G3184 Mild cognitive impairment, so stated: Secondary | ICD-10-CM | POA: Diagnosis present

## 2013-04-13 DIAGNOSIS — D72829 Elevated white blood cell count, unspecified: Secondary | ICD-10-CM | POA: Diagnosis present

## 2013-04-13 DIAGNOSIS — I10 Essential (primary) hypertension: Secondary | ICD-10-CM

## 2013-04-13 DIAGNOSIS — F29 Unspecified psychosis not due to a substance or known physiological condition: Secondary | ICD-10-CM

## 2013-04-13 DIAGNOSIS — R413 Other amnesia: Secondary | ICD-10-CM | POA: Diagnosis present

## 2013-04-13 DIAGNOSIS — E222 Syndrome of inappropriate secretion of antidiuretic hormone: Secondary | ICD-10-CM | POA: Diagnosis present

## 2013-04-13 DIAGNOSIS — E871 Hypo-osmolality and hyponatremia: Secondary | ICD-10-CM

## 2013-04-13 DIAGNOSIS — F1010 Alcohol abuse, uncomplicated: Secondary | ICD-10-CM | POA: Diagnosis present

## 2013-04-13 LAB — BASIC METABOLIC PANEL
CO2: 21 mEq/L (ref 19–32)
Calcium: 8.6 mg/dL (ref 8.4–10.5)
Calcium: 9.1 mg/dL (ref 8.4–10.5)
Chloride: 86 mEq/L — ABNORMAL LOW (ref 96–112)
Creatinine, Ser: 0.82 mg/dL (ref 0.50–1.35)
GFR calc Af Amer: >90 mL/min (ref >90)
GFR calc Af Amer: >90 mL/min (ref >90)
GFR calc Af Amer: >90 mL/min (ref >90)
GFR calc non Af Amer: >90 mL/min (ref >90)
GFR calc non Af Amer: 88 mL/min — ABNORMAL LOW (ref >90)
Potassium: 4.7 mEq/L (ref 3.5–5.1)
Potassium: 4.5 mEq/L (ref 3.5–5.1)
Sodium: 117 mEq/L — CL (ref 135–145)
Sodium: 120 mEq/L — ABNORMAL LOW (ref 135–145)

## 2013-04-13 LAB — RAPID URINE DRUG SCREEN, HOSP PERFORMED
Amphetamines: NOT DETECTED
Barbiturates: NOT DETECTED
Benzodiazepines: NOT DETECTED
Cocaine: NOT DETECTED
Tetrahydrocannabinol: NOT DETECTED

## 2013-04-13 LAB — CBC
HCT: 34.4 % — ABNORMAL LOW (ref 39.0–52.0)
HCT: 35.4 % — ABNORMAL LOW (ref 39.0–52.0)
Hemoglobin: 12.9 g/dL — ABNORMAL LOW (ref 13.0–17.0)
Hemoglobin: 13.3 g/dL (ref 13.0–17.0)
MCH: 31.9 pg (ref 26.0–34.0)
MCHC: 37.5 g/dL — ABNORMAL HIGH (ref 30.0–36.0)
MCV: 84.9 fL (ref 78.0–100.0)
WBC: 13.3 10*3/uL — ABNORMAL HIGH (ref 4.0–10.5)

## 2013-04-13 LAB — URINALYSIS, ROUTINE W REFLEX MICROSCOPIC
Bilirubin Urine: NEGATIVE
Glucose, UA: NEGATIVE mg/dL
Ketones, ur: NEGATIVE mg/dL
pH: 7 (ref 5.0–8.0)

## 2013-04-13 LAB — TSH: TSH: 1.001 u[IU]/mL (ref 0.350–4.500)

## 2013-04-13 LAB — SODIUM, URINE, RANDOM: Sodium, Ur: 85 mEq/L

## 2013-04-13 LAB — OSMOLALITY: Osmolality: 267 mOsm/kg — ABNORMAL LOW (ref 275–300)

## 2013-04-13 LAB — MRSA PCR SCREENING: MRSA by PCR: NEGATIVE

## 2013-04-13 MED ORDER — LORAZEPAM 2 MG/ML IJ SOLN: 1.0000 mg | Freq: Four times a day (QID) | INTRAMUSCULAR | Status: AC | PRN

## 2013-04-13 MED ORDER — LORAZEPAM 1 MG PO TABS
1.0000 mg | ORAL_TABLET | Freq: Four times a day (QID) | ORAL | Status: AC | PRN
  Administered 2013-04-14 (×2): 1 mg via ORAL
  Filled 2013-04-13 (×2): qty 1

## 2013-04-13 MED ORDER — SODIUM CHLORIDE 0.9 % IV SOLN: INTRAVENOUS | Status: DC

## 2013-04-13 MED ORDER — DOXYCYCLINE HYCLATE 100 MG PO TABS
100.0000 mg | ORAL_TABLET | Freq: Two times a day (BID) | ORAL | Status: DC
  Administered 2013-04-13 – 2013-04-14 (×4): 100 mg via ORAL
  Filled 2013-04-13 (×5): qty 1

## 2013-04-13 MED ORDER — ALUM & MAG HYDROXIDE-SIMETH 200-200-20 MG/5ML PO SUSP: 30.0000 mL | Freq: Four times a day (QID) | ORAL | Status: DC | PRN

## 2013-04-13 MED ORDER — ZOLPIDEM TARTRATE 5 MG PO TABS
10.0000 mg | ORAL_TABLET | Freq: Once | ORAL | Status: AC
  Administered 2013-04-13: 5 mg via ORAL
  Filled 2013-04-13: qty 2

## 2013-04-13 MED ORDER — SODIUM CHLORIDE 0.9 % IV SOLN
INTRAVENOUS | Status: DC
  Administered 2013-04-13: 04:00:00 via INTRAVENOUS

## 2013-04-13 MED ORDER — ADULT MULTIVITAMIN W/MINERALS CH
1.0000 | ORAL_TABLET | Freq: Every day | ORAL | Status: DC
  Administered 2013-04-13 – 2013-04-18 (×6): 1 via ORAL
  Filled 2013-04-13 (×6): qty 1

## 2013-04-13 MED ORDER — ZOLPIDEM TARTRATE 5 MG PO TABS
5.0000 mg | ORAL_TABLET | Freq: Every evening | ORAL | Status: DC | PRN
  Administered 2013-04-13 – 2013-04-15 (×3): 5 mg via ORAL
  Filled 2013-04-13 (×4): qty 1

## 2013-04-13 MED ORDER — ACETAMINOPHEN 650 MG RE SUPP: 650.0000 mg | Freq: Four times a day (QID) | RECTAL | Status: DC | PRN

## 2013-04-13 MED ORDER — OXYCODONE HCL 5 MG PO TABS: 5.0000 mg | ORAL_TABLET | ORAL | Status: DC | PRN

## 2013-04-13 MED ORDER — ACETAMINOPHEN 325 MG PO TABS
650.0000 mg | ORAL_TABLET | Freq: Four times a day (QID) | ORAL | Status: DC | PRN
  Administered 2013-04-15 – 2013-04-16 (×2): 650 mg via ORAL
  Filled 2013-04-13 (×2): qty 2

## 2013-04-13 MED ORDER — ONDANSETRON HCL 4 MG/2ML IJ SOLN: 4.0000 mg | Freq: Four times a day (QID) | INTRAMUSCULAR | Status: DC | PRN

## 2013-04-13 MED ORDER — HYDROMORPHONE HCL PF 1 MG/ML IJ SOLN: 0.5000 mg | INTRAMUSCULAR | Status: DC | PRN

## 2013-04-13 MED ORDER — FOLIC ACID 1 MG PO TABS
1.0000 mg | ORAL_TABLET | Freq: Every day | ORAL | Status: DC
  Administered 2013-04-13 – 2013-04-18 (×6): 1 mg via ORAL
  Filled 2013-04-13 (×6): qty 1

## 2013-04-13 MED ORDER — SODIUM CHLORIDE 3 % IV SOLN
INTRAVENOUS | Status: DC
  Administered 2013-04-13: 21:00:00 via INTRAVENOUS
  Filled 2013-04-13 (×2): qty 500

## 2013-04-13 MED ORDER — SODIUM CHLORIDE 0.9 % IV SOLN: Freq: Once | INTRAVENOUS | Status: DC

## 2013-04-13 MED ORDER — CARVEDILOL 6.25 MG PO TABS
6.2500 mg | ORAL_TABLET | Freq: Two times a day (BID) | ORAL | Status: DC
  Administered 2013-04-13 – 2013-04-18 (×10): 6.25 mg via ORAL
  Filled 2013-04-13 (×15): qty 1

## 2013-04-13 MED ORDER — VITAMIN B-1 100 MG PO TABS
100.0000 mg | ORAL_TABLET | Freq: Every day | ORAL | Status: DC
  Administered 2013-04-13 – 2013-04-18 (×6): 100 mg via ORAL
  Filled 2013-04-13 (×6): qty 1

## 2013-04-13 MED ORDER — ENOXAPARIN SODIUM 40 MG/0.4ML ~~LOC~~ SOLN
40.0000 mg | SUBCUTANEOUS | Status: DC
  Administered 2013-04-13 – 2013-04-17 (×5): 40 mg via SUBCUTANEOUS
  Filled 2013-04-13 (×6): qty 0.4

## 2013-04-13 MED ORDER — ONDANSETRON HCL 4 MG PO TABS: 4.0000 mg | ORAL_TABLET | Freq: Four times a day (QID) | ORAL | Status: DC | PRN

## 2013-04-13 MED ORDER — THIAMINE HCL 100 MG/ML IJ SOLN
100.0000 mg | Freq: Every day | INTRAMUSCULAR | Status: DC
  Filled 2013-04-13 (×5): qty 1

## 2013-04-13 MED ORDER — ASPIRIN EC 325 MG PO TBEC
325.0000 mg | DELAYED_RELEASE_TABLET | Freq: Four times a day (QID) | ORAL | Status: DC | PRN
  Administered 2013-04-13 (×2): 325 mg via ORAL
  Filled 2013-04-13 (×2): qty 1
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
## 2013-04-13 NOTE — Progress Notes (Signed)
Critical Value: Na 117; Dr.Singh paged and made aware, new orders received; pt made aware of fluid restriction

## 2013-04-13 NOTE — Progress Notes (Signed)
Pt received bed on 2600; Report called to RN receiving pt; pt taken to 2616 via wheelchair, pt remained in stable condition
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
## 2013-04-13 NOTE — Progress Notes (Signed)
[facility]                                                                                Patient Demographics  Bobby [name], is a 62 y.o. male, DOB - [birth year], ZOX:[id number], WJX:[id number]  Admit date - 04/12/2013  Admitting Physician Ron Parker, MD  Outpatient Primary MD for the patient is Willow Ora, MD  LOS - 1   Chief Complaint  Patient presents with  . Stress        Assessment & Plan    1.Hyponatremia I agree that this is most likely SIADH versus alcohol abuse, patient and family not forthcoming about his alcohol intake, he is had chronically low sodium for several months, last being 125 one month ago, with normal saline his sodium levels have dropped, his urine sodium is greater than 70, urine and serum osmolality are pending, for now I will put him on fluid restriction and to request nephrology to monitor.Will check chest x-ray.   2. Ongoing confusion for months I doubt this is due to Hyponatremia, TSH is stable,I question if this is due to alcohol abuse,We'll check B12 and folate,Agree with MRI MRA.   3.Leukocytosis nonspecific, could be due to left elbow infection, continue doxycycline and monitor, will check baseline chest x-ray 2 view.   4.Alcohol use question if he abuses, does not give a straight answer, continue CIWA protocol, folic acid and thiamine.     Code Status: Full  Family Communication: -Wife and Family friend  Disposition Plan: Home   Procedures CT head, MRI MRA brain   Consults  renal   DVT Prophylaxis  Lovenox   Lab Results  Component Value Date   PLT 254 04/13/2013    Medications  Scheduled Meds: . carvedilol  6.25 mg Oral BID WC  . doxycycline  100 mg Oral Q12H  . enoxaparin (LOVENOX) injection  40 mg Subcutaneous Q24H  . folic acid  1 mg Oral Daily  . multivitamin with minerals  1 tablet Oral Daily  . thiamine  100 mg Oral Daily   Or  . thiamine  100 mg Intravenous Daily   Continuous Infusions:   PRN Meds:.acetaminophen, acetaminophen, alum & mag hydroxide-simeth, aspirin EC, HYDROmorphone (DILAUDID) injection, LORazepam, LORazepam, ondansetron (ZOFRAN) IV, ondansetron, oxyCODONE, zolpidem  Antibiotics     Anti-infectives   Start     Dose/Rate Route Frequency Ordered Stop   04/13/13 0315  doxycycline (VIBRA-TABS) tablet 100 mg     100 mg Oral Every 12 hours 04/13/13 0304         Time Spent in minutes   45   SINGH,PRASHANT K M.D on 04/13/2013 at 10:51 AM  Between 7am to 7pm - Pager - [phone]  After 7pm go to [email or web address] - password [email or web address]  And look for the night coverage person covering for me after hours  [facility] Office  [phone]    Subjective:   Bobby [name] today has, No headache, No chest pain, No abdominal pain - No Nausea, No new weakness tingling or numbness, No Cough - SOB.    Objective:   Filed Vitals:   04/13/13 0008 04/13/13 0315 04/13/13 0400 04/13/13 0806  BP: 147/90  153/70 171/93 134/75  Pulse: 70 75 72 71  Temp: 97.9 F (36.6 C)  98.1 F (36.7 C) 97.8 F (36.6 C)  TempSrc: Oral  Oral Oral  Resp: 15  18 18   Height:   5\' 11"  (1.803 m)   Weight:   81.149 kg (178 lb 14.4 oz)   SpO2: 98% 100% 100% 98%    Wt Readings from Last 3 Encounters:  04/13/13 81.149 kg (178 lb 14.4 oz)  03/17/13 78.019 kg (172 lb)  03/06/12 77.111 kg (170 lb)     Intake/Output Summary (Last 24 hours) at 04/13/13 1051 Last data filed at 04/13/13 0900  Gross per 24 hour  Intake    480 ml  Output      0 ml  Net    480 ml    Exam Awake Alert, Oriented X 3, No new F.N deficits, Normal affect,Gets somewhat confused at times. Gonzalez.AT,PERRAL Supple Neck,No JVD, No cervical lymphadenopathy appriciated.  Symmetrical Chest wall movement, Good air movement bilaterally, CTAB RRR,No Gallops,Rubs or new Murmurs, No Parasternal Heave +ve B.Sounds, Abd Soft, Non tender, No organomegaly appriciated, No rebound - guarding or rigidity. No Cyanosis,  Clubbing or edema, No new Rash or bruise, Right olecranon cellulitis is stable.   Data Review   Micro Results Recent Results (from the past 240 hour(s))  MRSA PCR SCREENING     Status: None   Collection Time    04/13/13  4:22 AM      Result Value Range Status   MRSA by PCR NEGATIVE  NEGATIVE Final   Comment:            The GeneXpert MRSA Assay (FDA     approved for NASAL specimens     only), is one component of a     comprehensive MRSA colonization     surveillance program. It is not     intended to diagnose MRSA     infection nor to guide or     monitor treatment for     MRSA infections.    Radiology Reports Ct Head Wo Contrast  04/13/2013   *RADIOLOGY REPORT*  Clinical Data: Insomnia, confusion and mind racing.  CT HEAD WITHOUT CONTRAST  Technique:  Contiguous axial images were obtained from the base of the skull through the vertex without contrast.  Comparison: None.  Findings: There is no evidence of acute infarction, mass lesion, or intra- or extra-axial hemorrhage on CT.  Mild white matter hypoattenuation along the left external capsule likely reflects chronic ischemic change.  The posterior fossa, including the cerebellum, brainstem and fourth ventricle, is within normal limits.  The third and lateral ventricles are unremarkable in appearance.  The cerebral hemispheres are symmetric in appearance, with normal gray-white differentiation.  No mass effect or midline shift is seen.  There is no evidence of fracture; visualized osseous structures are unremarkable in appearance.  The visualized portions of the orbits are within normal limits.  The paranasal sinuses and mastoid air cells are well-aerated.  No significant soft tissue abnormalities are seen.  IMPRESSION:  1.  No acute intracranial pathology seen on CT. 2.  Likely mild chronic ischemic change at the left external capsule.   Original Report Authenticated By: Tonia Ghent, M.D.    CBC  Recent Labs Lab 04/12/13 2308  04/13/13 0555  WBC 14.5* 13.3*  HGB 12.9* 13.3  HCT 34.4* 35.4*  PLT 248 254  MCV 84.9 85.9  MCH 31.9 32.3  MCHC 37.5* 37.6*  RDW 12.1 12.4  Chemistries   Recent Labs Lab 04/12/13 2308 04/13/13 0555  NA 120* 119*  K 4.5 4.7  CL 82* 86*  CO2 26 20  GLUCOSE 111* 93  BUN 6 7  CREATININE 0.83 0.71  CALCIUM 9.0 8.8  AST 27  --   ALT 21  --   ALKPHOS 57  --   BILITOT 0.3  --    ------------------------------------------------------------------------------------------------------------------ estimated creatinine clearance is 99.4 ml/min (by C-G formula based on Cr of 0.71). ------------------------------------------------------------------------------------------------------------------ No results found for this basename: HGBA1C,  in the last 72 hours ------------------------------------------------------------------------------------------------------------------ No results found for this basename: CHOL, HDL, LDLCALC, TRIG, CHOLHDL, LDLDIRECT,  in the last 72 hours ------------------------------------------------------------------------------------------------------------------  Recent Labs  04/13/13 0050  TSH 1.001   ------------------------------------------------------------------------------------------------------------------ No results found for this basename: VITAMINB12, FOLATE, FERRITIN, TIBC, IRON, RETICCTPCT,  in the last 72 hours  Coagulation profile No results found for this basename: INR, PROTIME,  in the last 168 hours  No results found for this basename: DDIMER,  in the last 72 hours  Cardiac Enzymes No results found for this basename: CK, CKMB, TROPONINI, MYOGLOBIN,  in the last 168 hours ------------------------------------------------------------------------------------------------------------------ No components found with this basename: POCBNP,

## 2013-04-14 ENCOUNTER — Inpatient Hospital Stay (HOSPITAL_COMMUNITY): Payer: 59

## 2013-04-14 LAB — VITAMIN B12: Vitamin B-12: 419 pg/mL (ref 211–911)

## 2013-04-14 LAB — BASIC METABOLIC PANEL
Calcium: 8.6 mg/dL (ref 8.4–10.5)
GFR calc Af Amer: >90 mL/min (ref >90)
GFR calc non Af Amer: >90 mL/min (ref >90)
Glucose, Bld: 130 mg/dL — ABNORMAL HIGH (ref 70–99)
Potassium: 4 mEq/L (ref 3.5–5.1)
Sodium: 125 mEq/L — ABNORMAL LOW (ref 135–145)

## 2013-04-14 LAB — SODIUM: Sodium: 123 mEq/L — ABNORMAL LOW (ref 135–145)

## 2013-04-14 MED ORDER — SULFAMETHOXAZOLE-TMP DS 800-160 MG PO TABS
1.0000 | ORAL_TABLET | Freq: Three times a day (TID) | ORAL | Status: DC
  Filled 2013-04-14 (×3): qty 1

## 2013-04-14 MED ORDER — VANCOMYCIN HCL IN DEXTROSE 1-5 GM/200ML-% IV SOLN
1000.0000 mg | Freq: Two times a day (BID) | INTRAVENOUS | Status: DC
  Administered 2013-04-14 – 2013-04-17 (×6): 1000 mg via INTRAVENOUS
  Filled 2013-04-14 (×8): qty 200

## 2013-04-14 MED ORDER — FUROSEMIDE 20 MG PO TABS
20.0000 mg | ORAL_TABLET | Freq: Every day | ORAL | Status: AC
  Administered 2013-04-14: 20 mg via ORAL
  Filled 2013-04-14: qty 1

## 2013-04-14 MED ORDER — SULFAMETHOXAZOLE-TMP DS 800-160 MG PO TABS
1.0000 | ORAL_TABLET | Freq: Two times a day (BID) | ORAL | Status: DC
  Administered 2013-04-14 (×2): 1 via ORAL
  Filled 2013-04-14 (×3): qty 1

## 2013-04-14 NOTE — Progress Notes (Addendum)
[facility]                                                                                Patient Demographics  Bobby [name], is a 62 y.o. male, DOB - 1951/07/24, ZOX:[id number], WJX:[id number]  Admit date - 04/12/2013  Admitting Physician Ron Parker, MD  Outpatient Primary MD for the patient is Willow Ora, MD  LOS - 2   Chief Complaint  Patient presents with  . 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     2. Ongoing confusion for months I doubt this is due to Hyponatremia, TSH is stable,I question if this is due to alcohol abuse,We'll check B12 and folate, stable MRI MRA.    3.Leukocytosis nonspecific, could be due to left non-per site is, since he got worse on doxycycline he has been switched to Bactrim at a higher dose as suggested by ID physician Dr. Ninetta Lights, x-ray of the left elbow is stable, also input appreciated.     4.Alcohol use question if he abuses, does not give a straight answer, continue CIWA protocol, folic acid and thiamine.     Code Status: Full  Family Communication: -Wife and Family friend  Disposition Plan: Home   Procedures CT head, MRI MRA brain, left elbow x-ray   Consults  renal, orthopedics   DVT Prophylaxis  Lovenox   Lab Results  Component Value Date   PLT 254 04/13/2013    Medications  Scheduled Meds: . carvedilol  6.25 mg Oral BID WC  . enoxaparin (LOVENOX) injection  40 mg Subcutaneous Q24H  . folic acid  1 mg Oral Daily  .  multivitamin with minerals  1 tablet Oral Daily  . sulfamethoxazole-trimethoprim  1 tablet Oral Q8H  . thiamine  100 mg Oral Daily   Or  . thiamine  100 mg Intravenous Daily   Continuous Infusions:  PRN Meds:.acetaminophen, acetaminophen, alum & mag hydroxide-simeth, aspirin EC, LORazepam, LORazepam, ondansetron (ZOFRAN) IV, ondansetron, zolpidem  Antibiotics     Anti-infectives   Start     Dose/Rate Route Frequency Ordered Stop   04/14/13 1400  sulfamethoxazole-trimethoprim (BACTRIM DS) 800-160 MG per tablet 1 tablet     1 tablet Oral 3 times per day 04/14/13 1000     04/14/13 0330  sulfamethoxazole-trimethoprim (BACTRIM DS) 800-160 MG per tablet 1 tablet  Status:  Discontinued     1 tablet Oral Every 12 hours 04/14/13 0327 04/14/13 1000   04/13/13 0315  doxycycline (VIBRA-TABS) tablet 100 mg  Status:  Discontinued     100 mg Oral Every 12 hours 04/13/13 0304 04/14/13 1000       Time Spent in minutes   45  Leroy Sea M.D on 04/14/2013 at 10:01 AM  Between 7am to 7pm - Pager - [phone]  After 7pm go to [email or web address] - password [email or web address]  And look for the night coverage person covering for me after hours  [facility] Office  [phone]    Subjective:   Bobby [name] today has, No headache, No chest pain, No abdominal pain - No Nausea, No new weakness tingling or numbness, No Cough - SOB.    Objective:   Filed Vitals:   04/13/13 2108 04/14/13 0005 04/14/13 0400 04/14/13 0731  BP: 146/71 131/75 123/65 143/66  Pulse: 76 67 62 70  Temp:  99.1 F (37.3 C)  98.6 F (37 C)  TempSrc:  Oral  Oral  Resp: 23 14  18   Height:      Weight:      SpO2: 100% 100%  97%    Wt Readings from Last 3 Encounters:  04/13/13 81.149 kg (178 lb 14.4 oz)  03/17/13 78.019 kg (172 lb)  03/06/12 77.111 kg (170 lb)     Intake/Output Summary (Last 24 hours) at 04/14/13 1001 Last data filed at 04/14/13 0900  Gross per 24 hour  Intake   1080 ml  Output    900 ml   Net    180 ml    Exam Awake Alert, Oriented X 3, No new F.N deficits, Normal affect, confusion has improved since yesterday . [place].AT,PERRAL Supple Neck,No JVD, No cervical lymphadenopathy appriciated.  Symmetrical Chest wall movement, Good air movement bilaterally, CTAB RRR,No Gallops,Rubs or new Murmurs, No Parasternal Heave +ve B.Sounds, Abd Soft, Non tender, No organomegaly appriciated, No rebound - guarding or rigidity. No Cyanosis, Clubbing or edema, No new Rash or bruise, left olecranon cellulitis is stable.   Data Review   Micro Results Recent Results (from the past 240 hour(s))  MRSA PCR SCREENING     Status: None   Collection Time    04/13/13  4:22 AM      Result Value Range Status   MRSA by PCR NEGATIVE  NEGATIVE Final   Comment:            The GeneXpert MRSA Assay (FDA     approved for NASAL specimens     only), is one component of a     comprehensive MRSA colonization     surveillance program. It is not     intended to diagnose MRSA     infection nor to guide or     monitor treatment for     MRSA infections.    Radiology Reports Ct Head Wo Contrast  04/13/2013   *RADIOLOGY REPORT*  Clinical Data: Insomnia, confusion and mind racing.  CT HEAD WITHOUT CONTRAST  Technique:  Contiguous axial images were obtained from the base of the skull through the vertex without contrast.  Comparison: None.  Findings: There is no evidence of acute infarction, mass lesion, or intra- or extra-axial hemorrhage on CT.  Mild white matter hypoattenuation along the left external capsule likely reflects chronic ischemic change.  The posterior fossa, including the cerebellum, brainstem and fourth ventricle, is within normal limits.  The third and lateral ventricles are unremarkable in appearance.  The cerebral hemispheres are symmetric in appearance, with normal gray-white differentiation.  No mass effect or midline shift is seen.  There is no evidence of fracture; visualized osseous structures  are unremarkable in appearance.  The visualized portions of the orbits are within normal limits.  The paranasal sinuses and mastoid air cells are  well-aerated.  No significant soft tissue abnormalities are seen.  IMPRESSION:  1.  No acute intracranial pathology seen on CT. 2.  Likely mild chronic ischemic change at the left external capsule.   Original Report Authenticated By: Tonia Ghent, M.D.    CBC  Recent Labs Lab 04/12/13 2308 04/13/13 0555  WBC 14.5* 13.3*  HGB 12.9* 13.3  HCT 34.4* 35.4*  PLT 248 254  MCV 84.9 85.9  MCH 31.9 32.3  MCHC 37.5* 37.6*  RDW 12.1 12.4    Chemistries   Recent Labs Lab 04/12/13 2308 04/13/13 0555 04/13/13 1024 04/13/13 1619 04/14/13 0348  NA 120* 119* 117* 120* 125*  K 4.5 4.7 4.3 4.5 4.0  CL 82* 86* 85* 85* 93*  CO2 26 20 21 23 22   GLUCOSE 111* 93 127* 98 130*  BUN 6 7 10 11 11   CREATININE 0.83 0.71 0.82 0.89 0.79  CALCIUM 9.0 8.8 8.6 9.1 8.6  AST 27  --   --   --   --   ALT 21  --   --   --   --   ALKPHOS 57  --   --   --   --   BILITOT 0.3  --   --   --   --    ------------------------------------------------------------------------------------------------------------------ estimated creatinine clearance is 99.4 ml/min (by C-G formula based on Cr of 0.79). ------------------------------------------------------------------------------------------------------------------ No results found for this basename: HGBA1C,  in the last 72 hours ------------------------------------------------------------------------------------------------------------------ No results found for this basename: CHOL, HDL, LDLCALC, TRIG, CHOLHDL, LDLDIRECT,  in the last 72 hours ------------------------------------------------------------------------------------------------------------------  Recent Labs  04/13/13 0050  TSH 1.001   ------------------------------------------------------------------------------------------------------------------  Recent  Labs  04/13/13 1619  VITAMINB12 419    Coagulation profile No results found for this basename: INR, PROTIME,  in the last 168 hours  No results found for this basename: DDIMER,  in the last 72 hours  Cardiac Enzymes No results found for this basename: CK, CKMB, TROPONINI, MYOGLOBIN,  in the last 168 hours ------------------------------------------------------------------------------------------------------------------ No components found with this basename: POCBNP,

## 2013-04-14 NOTE — Progress Notes (Signed)
ANTIBIOTIC CONSULT NOTE - INITIAL  Pharmacy Consult for vancomyin Indication: bursitis  Allergies  Allergen Reactions  . Tramadol     REACTION: hives    Patient Measurements: Height: 5\' 11"  (180.3 cm) Weight: 178 lb 14.4 oz (81.149 kg) IBW/kg (Calculated) : 75.3  Vital Signs: Temp: 97.9 F (36.6 C) (05/26 1136) Temp src: Oral (05/26 1136) BP: 115/66 mmHg (05/26 1136) Pulse Rate: 68 (05/26 1136) Intake/Output from previous day: 05/25 0701 - 05/26 0700 In: 1520 [P.O.:1080; I.V.:440] Out: 900 [Urine:900] Intake/Output from this shift: Total I/O In: 520 [P.O.:480; I.V.:40] Out: -   Labs:  Recent Labs  04/12/13 2308 04/13/13 0555 04/13/13 1024 04/13/13 1619 04/14/13 0348  WBC 14.5* 13.3*  --   --   --   HGB 12.9* 13.3  --   --   --   PLT 248 254  --   --   --   CREATININE 0.83 0.71 0.82 0.89 0.79   Estimated Creatinine Clearance: 99.4 ml/min (by C-G formula based on Cr of 0.79).  Microbiology: Recent Results (from the past 720 hour(s))  MRSA PCR SCREENING     Status: None   Collection Time    04/13/13  4:22 AM      Result Value Range Status   MRSA by PCR NEGATIVE  NEGATIVE Final   Comment:            The GeneXpert MRSA Assay (FDA     approved for NASAL specimens     only), is one component of a     comprehensive MRSA colonization     surveillance program. It is not     intended to diagnose MRSA     infection nor to guide or     monitor treatment for     MRSA infections.    Medical History: Past Medical History  Diagnosis Date  . Hypertension   . Insomnia   . Anxiety    Assessment: 28 YOM who was on Bactrim PTA after accident for scrapes on arms- also had nodules drained. Was on doxy + Bactrim, but pt still complaining of pain and increased redness at the site. To change to IV vancomycin to see if this improves. SCr 0.79, CrCl ~148mL/min.  Goal of Therapy:  Vancomycin trough level 10-15 mcg/ml  Plan:  1. Start vancomycin 1000mg  IV q12 2.  Follow renal function and clinical status 3. Trough at stead state if indicated  Koraline Phillipson D. Rhenda [place], PharmD Clinical Pharmacist Pager: [phone] 04/14/2013 1:39 PM

## 2013-04-14 NOTE — Consult Note (Signed)
Reason for Consult:  Left elbow pain Referring Physician: Dr. Esaw [name] is an 62 y.o. male.  HPI:  62 y/o RHD male without significant PMH c/o left elbow pain for the last few weeks much worse Friday with swelling, redness and pain.  He went to an urgent care, and his olecranon bursa was drained.  He was started on Bactrim and reports improvement over the last few days.  He is admitted due to hyponatremia.  He denies f/c/n/v.  He says the elbow is feeling much better with decreased swelling, tenderness and redness since Friday.  He works with his elbows on a desk a lot and reports that both elbows swell some.  He's never had any surgery or injury to his elbows prior to this episode.  He's currently on doxycycline and bactrim.  Past Medical History  Diagnosis Date  . Hypertension   . Insomnia   . Anxiety     Past Surgical History  Procedure Laterality Date  . Shoulder surgery  1980s    right shoulder    Family History  Problem Relation Age of Onset  . Hypertension Mother   . Stroke Mother   . Colon cancer Neg Hx   . Prostate cancer Neg Hx   . CAD Neg Hx     Social History:  reports that he has never smoked. He has never used smokeless tobacco. He reports that  drinks alcohol. He reports that he does not use illicit drugs.  Allergies:  Allergies  Allergen Reactions  . Tramadol     REACTION: hives    Medications: I have reviewed the patient's current medications.  Results for orders placed during the hospital encounter of 04/12/13 (from the past 48 hour(s))  CBC     Status: Abnormal   Collection Time    04/12/13 11:08 PM      Result Value Range   WBC 14.5 (*) 4.0 - 10.5 K/uL   RBC 4.05 (*) 4.22 - 5.81 MIL/uL   Hemoglobin 12.9 (*) 13.0 - 17.0 g/dL   HCT 40.9 (*) 81.1 - 91.4 %   MCV 84.9  78.0 - 100.0 fL   MCH 31.9  26.0 - 34.0 pg   MCHC 37.5 (*) 30.0 - 36.0 g/dL   Comment: RULED OUT INTERFERING SUBSTANCES   RDW 12.1  11.5 - 15.5 %   Platelets 248  150  - 400 K/uL  COMPREHENSIVE METABOLIC PANEL     Status: Abnormal   Collection Time    04/12/13 11:08 PM      Result Value Range   Sodium 120 (*) 135 - 145 mEq/L   Potassium 4.5  3.5 - 5.1 mEq/L   Chloride 82 (*) 96 - 112 mEq/L   CO2 26  19 - 32 mEq/L   Glucose, Bld 111 (*) 70 - 99 mg/dL   BUN 6  6 - 23 mg/dL   Creatinine, Ser 7.82  0.50 - 1.35 mg/dL   Calcium 9.0  8.4 - 95.6 mg/dL   Total Protein 7.0  6.0 - 8.3 g/dL   Albumin 3.6  3.5 - 5.2 g/dL   AST 27  0 - 37 U/L   ALT 21  0 - 53 U/L   Alkaline Phosphatase 57  39 - 117 U/L   Total Bilirubin 0.3  0.3 - 1.2 mg/dL   GFR calc non Af Amer >90  >90 mL/min   GFR calc Af Amer >90  >90 mL/min   Comment:  The eGFR has been calculated     using the CKD EPI equation.     This calculation has not been     validated in all clinical     situations.     eGFR's persistently     <90 mL/min signify     possible Chronic Kidney Disease.  Bobby [name]     Status: Abnormal   Collection Time    04/12/13 11:08 PM      Result Value Range   Alcohol, Ethyl (B) 92 (*) 0 - 11 mg/dL   Comment:            LOWEST DETECTABLE LIMIT FOR     SERUM ALCOHOL IS 11 mg/dL     FOR MEDICAL PURPOSES ONLY  URINALYSIS, ROUTINE W REFLEX MICROSCOPIC     Status: Abnormal   Collection Time    04/12/13 11:38 PM      Result Value Range   Color, Urine YELLOW  YELLOW   APPearance CLEAR  CLEAR   Specific Gravity, Urine 1.008  1.005 - 1.030   pH 7.0  5.0 - 8.0   Glucose, UA NEGATIVE  NEGATIVE mg/dL   Hgb urine dipstick SMALL (*) NEGATIVE   Bilirubin Urine NEGATIVE  NEGATIVE   Ketones, ur NEGATIVE  NEGATIVE mg/dL   Protein, ur NEGATIVE  NEGATIVE mg/dL   Urobilinogen, UA 1.0  0.0 - 1.0 mg/dL   Nitrite NEGATIVE  NEGATIVE   Leukocytes, UA NEGATIVE  NEGATIVE  URINE MICROSCOPIC-ADD ON     Status: None   Collection Time    04/12/13 11:38 PM      Result Value Range   WBC, UA 0-2  <3 WBC/hpf   RBC / HPF 3-6  <3 RBC/hpf   Bacteria, UA RARE  RARE  URINE RAPID DRUG  SCREEN (HOSP PERFORMED)     Status: None   Collection Time    04/12/13 11:39 PM      Result Value Range   Opiates NONE DETECTED  NONE DETECTED   Cocaine NONE DETECTED  NONE DETECTED   Benzodiazepines NONE DETECTED  NONE DETECTED   Amphetamines NONE DETECTED  NONE DETECTED   Tetrahydrocannabinol NONE DETECTED  NONE DETECTED   Barbiturates NONE DETECTED  NONE DETECTED   Comment:            DRUG SCREEN FOR MEDICAL PURPOSES     ONLY.  IF CONFIRMATION IS NEEDED     FOR ANY PURPOSE, NOTIFY LAB     WITHIN 5 DAYS.                LOWEST DETECTABLE LIMITS     FOR URINE DRUG SCREEN     Drug Class       Cutoff (ng/mL)     Amphetamine      1000     Barbiturate      200     Benzodiazepine   200     Tricyclics       300     Opiates          300     Cocaine          300     THC              50  AMMONIA     Status: Abnormal   Collection Time    04/12/13 11:58 PM      Result Value Range   Ammonia <10 (*) 11 - 60 umol/L  OSMOLALITY  Status: Abnormal   Collection Time    04/13/13 12:50 AM      Result Value Range   Osmolality 267 (*) 275 - 300 mOsm/kg  TSH     Status: None   Collection Time    04/13/13 12:50 AM      Result Value Range   TSH 1.001  0.350 - 4.500 uIU/mL  CORTISOL     Status: None   Collection Time    04/13/13 12:50 AM      Result Value Range   Cortisol, Plasma 16.9     Comment: (NOTE)     AM:  4.3 - 22.4 ug/dL     PM:  3.1 - 16.1 ug/dL  MRSA PCR SCREENING     Status: None   Collection Time    04/13/13  4:22 AM      Result Value Range   MRSA by PCR NEGATIVE  NEGATIVE   Comment:            The GeneXpert MRSA Assay (FDA     approved for NASAL specimens     only), is one component of a     comprehensive MRSA colonization     surveillance program. It is not     intended to diagnose MRSA     infection nor to guide or     monitor treatment for     MRSA infections.  BASIC METABOLIC PANEL     Status: Abnormal   Collection Time    04/13/13  5:55 AM      Result  Value Range   Sodium 119 (*) 135 - 145 mEq/L   Comment: CRITICAL RESULT CALLED TO, READ BACK BY AND VERIFIED WITH:     Bobby [name] [id number] Bobby [name]   Potassium 4.7  3.5 - 5.1 mEq/L   Chloride 86 (*) 96 - 112 mEq/L   CO2 20  19 - 32 mEq/L   Glucose, Bld 93  70 - 99 mg/dL   BUN 7  6 - 23 mg/dL   Creatinine, Ser 1.19  0.50 - 1.35 mg/dL   Calcium 8.8  8.4 - 14.7 mg/dL   GFR calc non Af Amer >90  >90 mL/min   GFR calc Af Amer >90  >90 mL/min   Comment:            The eGFR has been calculated     using the CKD EPI equation.     This calculation has not been     validated in all clinical     situations.     eGFR's persistently     <90 mL/min signify     possible Chronic Kidney Disease.  CBC     Status: Abnormal   Collection Time    04/13/13  5:55 AM      Result Value Range   WBC 13.3 (*) 4.0 - 10.5 K/uL   RBC 4.12 (*) 4.22 - 5.81 MIL/uL   Hemoglobin 13.3  13.0 - 17.0 g/dL   HCT 82.9 (*) 56.2 - 13.0 %   MCV 85.9  78.0 - 100.0 fL   MCH 32.3  26.0 - 34.0 pg   MCHC 37.6 (*) 30.0 - 36.0 g/dL   Comment: RULED OUT INTERFERING SUBSTANCES   RDW 12.4  11.5 - 15.5 %   Platelets 254  150 - 400 K/uL  OSMOLALITY, URINE     Status: Abnormal   Collection Time    04/13/13  6:52 AM      Result Value  Range   Osmolality, Ur 276 (*) 390 - 1090 mOsm/kg  SODIUM, URINE, RANDOM     Status: None   Collection Time    04/13/13  6:53 AM      Result Value Range   Sodium, Ur 85    BASIC METABOLIC PANEL     Status: Abnormal   Collection Time    04/13/13 10:24 AM      Result Value Range   Sodium 117 (*) 135 - 145 mEq/L   Comment: CRITICAL RESULT CALLED TO, READ BACK BY AND VERIFIED WITH:     ARMSTRONG C,RN 1117 04/13/13 SCALES H   Potassium 4.3  3.5 - 5.1 mEq/L   Chloride 85 (*) 96 - 112 mEq/L   CO2 21  19 - 32 mEq/L   Glucose, Bld 127 (*) 70 - 99 mg/dL   BUN 10  6 - 23 mg/dL   Creatinine, Ser 5.78  0.50 - 1.35 mg/dL   Calcium 8.6  8.4 - 46.9 mg/dL   GFR calc non Af Amer >90  >90 mL/min   GFR  calc Af Amer >90  >90 mL/min   Comment:            The eGFR has been calculated     using the CKD EPI equation.     This calculation has not been     validated in all clinical     situations.     eGFR's persistently     <90 mL/min signify     possible Chronic Kidney Disease.  BASIC METABOLIC PANEL     Status: Abnormal   Collection Time    04/13/13  4:19 PM      Result Value Range   Sodium 120 (*) 135 - 145 mEq/L   Potassium 4.5  3.5 - 5.1 mEq/L   Chloride 85 (*) 96 - 112 mEq/L   CO2 23  19 - 32 mEq/L   Glucose, Bld 98  70 - 99 mg/dL   BUN 11  6 - 23 mg/dL   Creatinine, Ser 6.29  0.50 - 1.35 mg/dL   Calcium 9.1  8.4 - 52.8 mg/dL   GFR calc non Af Amer 88 (*) >90 mL/min   GFR calc Af Amer >90  >90 mL/min   Comment:            The eGFR has been calculated     using the CKD EPI equation.     This calculation has not been     validated in all clinical     situations.     eGFR's persistently     <90 mL/min signify     possible Chronic Kidney Disease.  VITAMIN B12     Status: None   Collection Time    04/13/13  4:19 PM      Result Value Range   Vitamin B-12 419  211 - 911 pg/mL  BASIC METABOLIC PANEL     Status: Abnormal   Collection Time    04/14/13  3:48 AM      Result Value Range   Sodium 125 (*) 135 - 145 mEq/L   Potassium 4.0  3.5 - 5.1 mEq/L   Chloride 93 (*) 96 - 112 mEq/L   CO2 22  19 - 32 mEq/L   Glucose, Bld 130 (*) 70 - 99 mg/dL   BUN 11  6 - 23 mg/dL   Creatinine, Ser 4.13  0.50 - 1.35 mg/dL   Calcium 8.6  8.4 - 24.4  mg/dL   GFR calc non Af Amer >90  >90 mL/min   GFR calc Af Amer >90  >90 mL/min   Comment:            The eGFR has been calculated     using the CKD EPI equation.     This calculation has not been     validated in all clinical     situations.     eGFR's persistently     <90 mL/min signify     possible Chronic Kidney Disease.    Dg Chest 2 View  04/13/2013   *RADIOLOGY REPORT*  Clinical Data: Cough, confusion  CHEST - 2 VIEW   Comparison: None.  Findings: Normal mediastinum and cardiac silhouette.  Normal pulmonary  vasculature.  No evidence of effusion, infiltrate, or pneumothorax.  No acute bony abnormality.  Internal fixation of right shoulder  IMPRESSION: No acute cardiopulmonary process.   Original Report Authenticated By: Genevive Bi, M.D.   Dg Elbow 2 Views Left  04/14/2013   *RADIOLOGY REPORT*  Clinical Data: Left-sided elbow pain and swelling, limited range of motion  LEFT ELBOW - 2 VIEW  Comparison: None.  Findings:  No displaced fracture or elbow joint effusion.  There is soft tissue swelling about the olecranon process of the elbow.  No subcutaneous emphysema radiopaque foreign body.  IMPRESSION:  Soft tissue swelling about the olecranon process of the elbow without associated displaced fracture or elbow joint effusion.   Original Report Authenticated By: Tacey Ruiz, MD   Ct Head Wo Contrast  04/13/2013   *RADIOLOGY REPORT*  Clinical Data: Insomnia, confusion and mind racing.  CT HEAD WITHOUT CONTRAST  Technique:  Contiguous axial images were obtained from the base of the skull through the vertex without contrast.  Comparison: None.  Findings: There is no evidence of acute infarction, mass lesion, or intra- or extra-axial hemorrhage on CT.  Mild white matter hypoattenuation along the left external capsule likely reflects chronic ischemic change.  The posterior fossa, including the cerebellum, brainstem and fourth ventricle, is within normal limits.  The third and lateral ventricles are unremarkable in appearance.  The cerebral hemispheres are symmetric in appearance, with normal gray-white differentiation.  No mass effect or midline shift is seen.  There is no evidence of fracture; visualized osseous structures are unremarkable in appearance.  The visualized portions of the orbits are within normal limits.  The paranasal sinuses and mastoid air cells are well-aerated.  No significant soft tissue abnormalities are  seen.  IMPRESSION:  1.  No acute intracranial pathology seen on CT. 2.  Likely mild chronic ischemic change at the left external capsule.   Original Report Authenticated By: Tonia Ghent, M.D.   Mr [facility] Wo Contrast  04/13/2013   *RADIOLOGY REPORT*  Clinical Data:  Confusion  MRI HEAD WITHOUT CONTRAST MRA HEAD WITHOUT CONTRAST  Technique:  Multiplanar, multiecho pulse sequences of the brain and surrounding structures were obtained without intravenous contrast. Angiographic images of the head were obtained using MRA technique without contrast.  Comparison:  Head CT 04/12/2013  MRI HEAD  Findings:  Diffusion imaging does not show any acute or subacute infarction.  No brainstem or cerebellar abnormality.  The cerebral hemispheres are normal except for a few punctate foci of signal in the frontal lobe white matter, not likely clinical relevance.  No mass lesion, hemorrhage, hydrocephalus or extra-axial collection. No pituitary mass.  No inflammatory sinus disease.  IMPRESSION: Normal examination for a patient of this age.  No acute finding. Few white matter foci in the frontal lobes not likely of clinical relevance.  MRA HEAD  Findings: Both internal carotid arteries are widely patent into the brain.  The anterior and middle cerebral vessels are normal without proximal stenosis, aneurysm or vascular malformation.  Both vertebral arteries are patent to the basilar.  No basilar stenosis. Posterior circulation branch vessels appear normal.  IMPRESSION: Normal intracranial MR angiography of the large and medium-sized vessels.   Original Report Authenticated By: Paulina Fusi, M.D.   Mr Brain Wo Contrast  04/13/2013   *RADIOLOGY REPORT*  Clinical Data:  Confusion  MRI HEAD WITHOUT CONTRAST MRA HEAD WITHOUT CONTRAST  Technique:  Multiplanar, multiecho pulse sequences of the brain and surrounding structures were obtained without intravenous contrast. Angiographic images of the head were obtained using MRA technique  without contrast.  Comparison:  Head CT 04/12/2013  MRI HEAD  Findings:  Diffusion imaging does not show any acute or subacute infarction.  No brainstem or cerebellar abnormality.  The cerebral hemispheres are normal except for a few punctate foci of signal in the frontal lobe white matter, not likely clinical relevance.  No mass lesion, hemorrhage, hydrocephalus or extra-axial collection. No pituitary mass.  No inflammatory sinus disease.  IMPRESSION: Normal examination for a patient of this age.  No acute finding. Few white matter foci in the frontal lobes not likely of clinical relevance.  MRA HEAD  Findings: Both internal carotid arteries are widely patent into the brain.  The anterior and middle cerebral vessels are normal without proximal stenosis, aneurysm or vascular malformation.  Both vertebral arteries are patent to the basilar.  No basilar stenosis. Posterior circulation branch vessels appear normal.  IMPRESSION: Normal intracranial MR angiography of the large and medium-sized vessels.   Original Report Authenticated By: Paulina Fusi, M.D.    ROS:  As above and o/w neg. PE:  Blood pressure 143/66, pulse 70, temperature 98.6 F (37 C), temperature source Oral, resp. rate 18, height 5\' 11"  (1.803 m), weight 81.149 kg (178 lb 14.4 oz), SpO2 97.00%. wn wd male in nad.  A and O.  Mood and affect normal.  EOMI.  Resp unlabored.  L elbow with healing incision on the olecranon.  Small amoutn of erythema aroudn the incision.  No drainage.  Skin o/w intact.  Olecranon bursa is slightly swollen.  Full ROM at the lbow.  No lymphadenopathy.  2+ radial and ulnar pulses.  5/5 strength at biceps and triceps.  Sens to LT intact in radial, median and ulnar n dist.  Forearm slightly swollen.  R elbow with slightly swollen olecranon bursa but no erythema.  Normal R elbow exam otherwise.  Assessment/Plan: L elbow olecranon bursitis and cellulitis - based on his history and PE today, I believe he's responding  appropriately to the oral abx.  I'd recommend continuing both abx for the next 10 days.  I explained the nature of olecranon bursitis in detail tot he patient and his wife  He needs to keep the pressure off his elbow and allow the bursa to settle down.  I'll plan to see him back in the office in about 10 days.    R elbow olecranon bursitis - pressure relief recommended to allow bursitis to resolve.  They understand the plan and agree.  Toni Arthurs 04/14/2013, 9:31 AM

## 2013-04-14 NOTE — Progress Notes (Signed)
[facility]  Subjective:  Awake, alert, wife at bedside.She says he has taken lots of NSAIDs PTA (ibuprofen or naproxen)--this can also cause low [Na].  On 1.2  L fluid restriction and  hypertonic saline has been d/c'd    Objective: Vital signs in last 24 hours: Blood pressure 143/66, pulse 70, temperature 98.6 F (37 C), temperature source Oral, resp. rate 18, height 5\' 11"  (1.803 m), weight 81.149 kg (178 lb 14.4 oz), SpO2 97.00%.    PHYSICAL EXAM General--as above Chest--clear Heart--no rub Abd--nontender Extr--no edema  Lab Results:   Recent Labs Lab 04/13/13 1024 04/13/13 1619 04/14/13 0348  NA 117* 120* 125*  K 4.3 4.5 4.0  CL 85* 85* 93*  CO2 21 23 22   BUN 10 11 11   CREATININE 0.82 0.89 0.79  GLUCOSE 127* 98 130*  CALCIUM 8.6 9.1 8.6     Recent Labs  04/12/13 2308 04/13/13 0555  WBC 14.5* 13.3*  HGB 12.9* 13.3  HCT 34.4* 35.4*  PLT 248 254     I have reviewed the patient's current medications.  Scheduled: . carvedilol  6.25 mg Oral BID WC  . doxycycline  100 mg Oral Q12H  . enoxaparin (LOVENOX) injection  40 mg Subcutaneous Q24H  . folic acid  1 mg Oral Daily  . multivitamin with minerals  1 tablet Oral Daily  . sulfamethoxazole-trimethoprim  1 tablet Oral Q12H  . thiamine  100 mg Oral Daily   Or  . thiamine  100 mg Intravenous Daily   Continuous: . sodium chloride (hypertonic) 40 mL/hr at 04/13/13 2040    Assessment/Plan: 1. Hyponatremia- looks euvolemic, no hx of liver / kidney / heart failure; thyroid and adrenal testing wnl. Urine osm is somewhat elevated at 261, but not  High.  CXR and CT head show no mass.  Will d/c hypertonic saline and Rx furosemide 20 mg po x1 today 2. AMS-MS better today by my observation  And according to wife.  Need to watch for dt's 3. Hx ETOH abuse-see above 4. HTN- takes coreg at home 5. Infected olecranon bursa on L--on trim-sulfa for now.       LOS: 2 days   Rosamond Andress  F 04/14/2013,8:29 AM   .labalb

## 2013-04-14 NOTE — ED Provider Notes (Signed)
Medical screening examination/treatment/procedure(s) were conducted as a shared visit with non-physician practitioner(s) and myself.  I personally evaluated the patient during the encounter  Altered metal status.  This may be secondary to hyponatremia versus or acute encephalopathy.  Thiamine, folic acid, multivitamin given in emergency department.  Patient be admitted for ongoing workup.  He'll likely need MRI scanning and ongoing evaluation after sodium repletion.   Lyanne Co, MD 04/14/13 [phone]

## 2013-04-15 ENCOUNTER — Inpatient Hospital Stay (HOSPITAL_COMMUNITY): Payer: 59

## 2013-04-15 DIAGNOSIS — F1010 Alcohol abuse, uncomplicated: Secondary | ICD-10-CM

## 2013-04-15 DIAGNOSIS — I10 Essential (primary) hypertension: Secondary | ICD-10-CM

## 2013-04-15 DIAGNOSIS — R4182 Altered mental status, unspecified: Secondary | ICD-10-CM

## 2013-04-15 LAB — BASIC METABOLIC PANEL
BUN: 14 mg/dL (ref 6–23)
CO2: 24 mEq/L (ref 19–32)
CO2: 23 mEq/L (ref 19–32)
Calcium: 8.9 mg/dL (ref 8.4–10.5)
Chloride: 85 mEq/L — ABNORMAL LOW (ref 96–112)
Creatinine, Ser: 0.79 mg/dL (ref 0.50–1.35)
Creatinine, Ser: 0.85 mg/dL (ref 0.50–1.35)
GFR calc non Af Amer: >90 mL/min (ref >90)
Glucose, Bld: 96 mg/dL (ref 70–99)
Sodium: 124 mEq/L — ABNORMAL LOW (ref 135–145)

## 2013-04-15 LAB — CBC
MCH: 31.7 pg (ref 26.0–34.0)
MCV: 86.6 fL (ref 78.0–100.0)
Platelets: 288 10*3/uL (ref 150–400)
RBC: 3.88 MIL/uL — ABNORMAL LOW (ref 4.22–5.81)
RDW: 12.4 % (ref 11.5–15.5)
WBC: 8.6 10*3/uL (ref 4.0–10.5)

## 2013-04-15 MED ORDER — FUROSEMIDE 40 MG PO TABS
40.0000 mg | ORAL_TABLET | Freq: Every day | ORAL | Status: AC
  Administered 2013-04-15: 40 mg via ORAL
  Filled 2013-04-15: qty 1

## 2013-04-15 NOTE — Consult Note (Signed)
Reason for Consult:AMS Referring Physician: Thedore Mins  CC: Confusion  HPI: Bobby [name] is an 62 y.o. male who reports that about a month ago he began to notice difficulties with his short term memory.  It is about that time the he also had a loss of business at work.  There was stress related to this.  His wife reports that he was staying at work for excessively long hours and when she would come home he would stare off into space and note be able to repeat things back to her.  Throughout this time there has been a trend that he is better in the mornings and worse later in the day.  Both the patient and the wife report that they do not feel that it was the memory problems that caused the difficulties at work.    Past Medical History  Diagnosis Date  . Hypertension   . Insomnia   . Anxiety     Past Surgical History  Procedure Laterality Date  . Shoulder surgery  1980s    right shoulder    Family History  Problem Relation Age of Onset  . Hypertension Mother   . Stroke Mother   . Colon cancer Neg Hx   . Prostate cancer Neg Hx   . CAD Neg Hx     -  Brother with psychiatric disease - ? Schizophrenia   -  Father with alcoholism  Social History:  reports that he has never smoked. He has never used smokeless tobacco. He reports that  drinks alcohol. He reports that he does not use illicit drugs.  Allergies  Allergen Reactions  . Tramadol     REACTION: hives    Medications:  I have reviewed the patient's current medications. Prior to Admission:  Prescriptions prior to admission  Medication Sig Dispense Refill  . carvedilol (COREG) 6.25 MG tablet Take 6.25 mg by mouth 2 (two) times daily with a meal.      . sulfamethoxazole-trimethoprim (BACTRIM DS) 800-160 MG per tablet Take 2 tablets by mouth 2 (two) times daily.      Marland Kitchen zolpidem (AMBIEN) 10 MG tablet Take 5 mg by mouth at bedtime as needed for sleep.        Scheduled: . carvedilol  6.25 mg Oral BID WC  . enoxaparin  (LOVENOX) injection  40 mg Subcutaneous Q24H  . folic acid  1 mg Oral Daily  . multivitamin with minerals  1 tablet Oral Daily  . thiamine  100 mg Oral Daily   Or  . thiamine  100 mg Intravenous Daily  . vancomycin  1,000 mg Intravenous Q12H    ROS: History obtained from the patient  General ROS: negative for - chills, fatigue, fever, night sweats, weight gain or weight loss Psychological ROS: memory difficulties, increased stressors Ophthalmic ROS: negative for - blurry vision, double vision, eye pain or loss of vision ENT ROS: negative for - epistaxis, nasal discharge, oral lesions, sore throat, tinnitus or vertigo Allergy and Immunology ROS: negative for - hives or itchy/watery eyes Hematological and Lymphatic ROS: negative for - bleeding problems, bruising or swollen lymph nodes Endocrine ROS: negative for - galactorrhea, hair pattern changes, polydipsia/polyuria or temperature intolerance Respiratory ROS: negative for - cough, hemoptysis, shortness of breath or wheezing Cardiovascular ROS: negative for - chest pain, dyspnea on exertion, edema or irregular heartbeat Gastrointestinal ROS: negative for - abdominal pain, diarrhea, hematemesis, nausea/vomiting or stool incontinence Genito-Urinary ROS: negative for - dysuria, hematuria, incontinence or urinary frequency/urgency Musculoskeletal  ROS: joint swelling Neurological ROS: as noted in HPI Dermatological ROS: skin lesions  Physical Examination: Blood pressure 133/81, pulse 74, temperature 97.6 F (36.4 C), temperature source Oral, resp. rate 14, height 5\' 11"  (1.803 m), weight 81.149 kg (178 lb 14.4 oz), SpO2 100.00%.  Neurologic Examination Mental Status: Alert, oriented, but notable memory deficits noted.  Speech fluent without evidence of aphasia.  Able to follow 3 step commands without difficulty. Cranial Nerves: II: Discs flat bilaterally; Visual fields grossly normal, pupils equal, round, reactive to light and  accommodation III,IV, VI: ptosis not present, extra-ocular motions intact bilaterally V,VII: smile symmetric, facial light touch sensation normal bilaterally VIII: hearing normal bilaterally IX,X: gag reflex present XI: bilateral shoulder shrug XII: midline tongue extension Motor: Right : Upper extremity   5/5    Left:     Upper extremity   5/5  Lower extremity   5/5     Lower extremity   5/5 Tone and bulk:normal tone throughout; no atrophy noted Sensory: Pinprick and light touch intact throughout, bilaterally Deep Tendon Reflexes: 2+ and symmetric with absent ankle jerks bilaterally Plantars: Right: downgoing   Left: downgoing Cerebellar: normal finger-to-nose and normal heel-to-shin test Gait: Unable to test CV: pulses palpable throughout   Laboratory Studies:   Basic Metabolic Panel:  Recent Labs Lab 04/12/13 2308 04/13/13 0555 04/13/13 1024 04/13/13 1619 04/14/13 0348 04/14/13 0925 04/14/13 1532  NA 120* 119* 117* 120* 125* 127* 123*  K 4.5 4.7 4.3 4.5 4.0  --   --   CL 82* 86* 85* 85* 93*  --   --   CO2 26 20 21 23 22   --   --   GLUCOSE 111* 93 127* 98 130*  --   --   BUN 6 7 10 11 11   --   --   CREATININE 0.83 0.71 0.82 0.89 0.79  --   --   CALCIUM 9.0 8.8 8.6 9.1 8.6  --   --     Liver Function Tests:  Recent Labs Lab 04/12/13 2308  AST 27  ALT 21  ALKPHOS 57  BILITOT 0.3  PROT 7.0  ALBUMIN 3.6   No results found for this basename: LIPASE, AMYLASE,  in the last 168 hours  Recent Labs Lab 04/12/13 2358  AMMONIA <10*    CBC:  Recent Labs Lab 04/12/13 2308 04/13/13 0555  WBC 14.5* 13.3*  HGB 12.9* 13.3  HCT 34.4* 35.4*  MCV 84.9 85.9  PLT 248 254    Cardiac Enzymes: No results found for this basename: CKTOTAL, CKMB, CKMBINDEX, TROPONINI,  in the last 168 hours  BNP: No components found with this basename: POCBNP,   CBG: No results found for this basename: GLUCAP,  in the last 168 hours  Microbiology: Results for orders placed  during the hospital encounter of 04/12/13  MRSA PCR SCREENING     Status: None   Collection Time    04/13/13  4:22 AM      Result Value Range Status   MRSA by PCR NEGATIVE  NEGATIVE Final   Comment:            The GeneXpert MRSA Assay (FDA     approved for NASAL specimens     only), is one component of a     comprehensive MRSA colonization     surveillance program. It is not     intended to diagnose MRSA     infection nor to guide or     monitor treatment  for     MRSA infections.    Coagulation Studies: No results found for this basename: LABPROT, INR,  in the last 72 hours  Urinalysis:  Recent Labs Lab 04/12/13 2338  COLORURINE YELLOW  LABSPEC 1.008  PHURINE 7.0  GLUCOSEU NEGATIVE  HGBUR SMALL*  BILIRUBINUR NEGATIVE  KETONESUR NEGATIVE  PROTEINUR NEGATIVE  UROBILINOGEN 1.0  NITRITE NEGATIVE  LEUKOCYTESUR NEGATIVE    Lipid Panel:     Component Value Date/Time   CHOL 164 03/17/2013 1500   TRIG 54.0 03/17/2013 1500   HDL 89.50 03/17/2013 1500   CHOLHDL 2 03/17/2013 1500   VLDL 10.8 03/17/2013 1500   LDLCALC 64 03/17/2013 1500    HgbA1C:  No results found for this basename: HGBA1C    Urine Drug Screen:     Component Value Date/Time   LABOPIA NONE DETECTED 04/12/2013 2339   COCAINSCRNUR NONE DETECTED 04/12/2013 2339   LABBENZ NONE DETECTED 04/12/2013 2339   AMPHETMU NONE DETECTED 04/12/2013 2339   THCU NONE DETECTED 04/12/2013 2339   LABBARB NONE DETECTED 04/12/2013 2339    Alcohol Level:  Recent Labs Lab 04/12/13 2308  ETH 92*    Imaging: Dg Chest 2 View  04/13/2013   *RADIOLOGY REPORT*  Clinical Data: Cough, confusion  CHEST - 2 VIEW  Comparison: None.  Findings: Normal mediastinum and cardiac silhouette.  Normal pulmonary  vasculature.  No evidence of effusion, infiltrate, or pneumothorax.  No acute bony abnormality.  Internal fixation of right shoulder  IMPRESSION: No acute cardiopulmonary process.   Original Report Authenticated By: Genevive Bi, M.D.    Dg Elbow 2 Views Left  04/14/2013   *RADIOLOGY REPORT*  Clinical Data: Left-sided elbow pain and swelling, limited range of motion  LEFT ELBOW - 2 VIEW  Comparison: None.  Findings:  No displaced fracture or elbow joint effusion.  There is soft tissue swelling about the olecranon process of the elbow.  No subcutaneous emphysema radiopaque foreign body.  IMPRESSION:  Soft tissue swelling about the olecranon process of the elbow without associated displaced fracture or elbow joint effusion.   Original Report Authenticated By: Tacey Ruiz, MD   Mr [facility] Head Wo Contrast  04/13/2013   *RADIOLOGY REPORT*  Clinical Data:  Confusion  MRI HEAD WITHOUT CONTRAST MRA HEAD WITHOUT CONTRAST  Technique:  Multiplanar, multiecho pulse sequences of the brain and surrounding structures were obtained without intravenous contrast. Angiographic images of the head were obtained using MRA technique without contrast.  Comparison:  Head CT 04/12/2013  MRI HEAD  Findings:  Diffusion imaging does not show any acute or subacute infarction.  No brainstem or cerebellar abnormality.  The cerebral hemispheres are normal except for a few punctate foci of signal in the frontal lobe white matter, not likely clinical relevance.  No mass lesion, hemorrhage, hydrocephalus or extra-axial collection. No pituitary mass.  No inflammatory sinus disease.  IMPRESSION: Normal examination for a patient of this age.  No acute finding. Few white matter foci in the frontal lobes not likely of clinical relevance.  MRA HEAD  Findings: Both internal carotid arteries are widely patent into the brain.  The anterior and middle cerebral vessels are normal without proximal stenosis, aneurysm or vascular malformation.  Both vertebral arteries are patent to the basilar.  No basilar stenosis. Posterior circulation branch vessels appear normal.  IMPRESSION: Normal intracranial MR angiography of the large and medium-sized vessels.   Original Report Authenticated By: Paulina Fusi, M.D.   Mr Brain Wo Contrast  04/13/2013   *  RADIOLOGY REPORT*  Clinical Data:  Confusion  MRI HEAD WITHOUT CONTRAST MRA HEAD WITHOUT CONTRAST  Technique:  Multiplanar, multiecho pulse sequences of the brain and surrounding structures were obtained without intravenous contrast. Angiographic images of the head were obtained using MRA technique without contrast.  Comparison:  Head CT 04/12/2013  MRI HEAD  Findings:  Diffusion imaging does not show any acute or subacute infarction.  No brainstem or cerebellar abnormality.  The cerebral hemispheres are normal except for a few punctate foci of signal in the frontal lobe white matter, not likely clinical relevance.  No mass lesion, hemorrhage, hydrocephalus or extra-axial collection. No pituitary mass.  No inflammatory sinus disease.  IMPRESSION: Normal examination for a patient of this age.  No acute finding. Few white matter foci in the frontal lobes not likely of clinical relevance.  MRA HEAD  Findings: Both internal carotid arteries are widely patent into the brain.  The anterior and middle cerebral vessels are normal without proximal stenosis, aneurysm or vascular malformation.  Both vertebral arteries are patent to the basilar.  No basilar stenosis. Posterior circulation branch vessels appear normal.  IMPRESSION: Normal intracranial MR angiography of the large and medium-sized vessels.   Original Report Authenticated By: Paulina Fusi, M.D.     Assessment/Plan: 62 year old male with cognitive complaints.  There are many confounding issues.  The patient has quite a few stressors.  He is flat and may be exhibiting a depression leading to cognitive issues.  Further complicating the situation though is the coexisting infection and hyponatremia which has been present for about the same amount of time as the cognitive issues and continue.  This may very well be causing a metabolic encephalopathy.  CT of the head and MRI of the brain have been reviewed and show  no significant abnormalities.  Lastly, the patient had a ETOH level of 92 at admission.  This may be contributing as well.  It would not be unreasonable to suspect that the cognitive issues are multifactorial and that all of the above issues contribute.  I discussed this with the patient and his wife at length.  Because there are likely contributing factors, such as ETOH, although I expect the patient will improve as his metabolic issues improve, his cognitive issues may lag behind the metabolic ones in improving.  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  No neurological conditions seem present that would be the etiology of the SIADH.  There has been no head trauma, no mass lesions, etc.   At this point, continuing to address the patient's metabolic issues seems most prudent.  Would also rule out other possible etiologies.  Recommendations: 1.  EEG 2.  Heavy metal screen, copper, RPR,   Thana Farr, MD [facility] [phone] 04/15/2013, 12:35 AM

## 2013-04-15 NOTE — Progress Notes (Signed)
EEG completed.

## 2013-04-15 NOTE — Progress Notes (Signed)
NEURO HOSPITALIST PROGRESS NOTE   SUBJECTIVE:                                                                                                                        Offers no neurological complains. EEG is normal. RPR and B12 also normal.   OBJECTIVE:                                                                                                                           Vital signs in last 24 hours: Temp:  [97.4 F (36.3 C)-98.3 F (36.8 C)] 98.2 F (36.8 C) (05/27 1538) Pulse Rate:  [65-75] 75 (05/27 1538) Resp:  [14-21] 21 (05/27 1538) BP: (105-154)/(65-88) 154/88 mmHg (05/27 1538) SpO2:  [98 %-100 %] 100 % (05/27 1538) Weight:  [76.5 kg (168 lb 10.4 oz)] 76.5 kg (168 lb 10.4 oz) (05/27 0859)  Intake/Output from previous day: 05/26 0701 - 05/27 0700 In: 1400 [P.O.:960; I.V.:40; IV Piggyback:400] Out: 1690 [Urine:1690] Intake/Output this shift: Total I/O In: 1280 [P.O.:1080; IV Piggyback:200] Out: -  Nutritional status: General  Past Medical History  Diagnosis Date  . Hypertension   . Insomnia   . Anxiety     Neurologic Exam:    Lab Results: Lab Results  Component Value Date/Time   CHOL 164 03/17/2013  3:00 PM   Lipid Panel No results found for this basename: CHOL, TRIG, HDL, CHOLHDL, VLDL, LDLCALC,  in the last 72 hours  Studies/Results: Dg Elbow 2 Views Left  04/14/2013   *RADIOLOGY REPORT*  Clinical Data: Left-sided elbow pain and swelling, limited range of motion  LEFT ELBOW - 2 VIEW  Comparison: None.  Findings:  No displaced fracture or elbow joint effusion.  There is soft tissue swelling about the olecranon process of the elbow.  No subcutaneous emphysema radiopaque foreign body.  IMPRESSION:  Soft tissue swelling about the olecranon process of the elbow without associated displaced fracture or elbow joint effusion.   Original Report Authenticated By: Tacey Ruiz, MD    MEDICATIONS  I have reviewed the patient's current medications.  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 Neuro-imaging unrevealing so far. Don't feel strongly to look to his spinal fluid at this moment, as there is not history of systemic malignancy to make me think about a paraneoplastic cognitive process. Clinical course and neurological manifestations not compatible with prion disease. I will suggest getting outpatient neurology follow up in order to pursue full neuropsychological testing and perhaps metabolic brain imaging.   Wyatt Portela, MD [facility] [phone]  04/15/2013, 6:30 PM

## 2013-04-15 NOTE — Progress Notes (Signed)
[facility]                                                                                Patient Demographics  Bobby [name], is a 62 y.o. male, DOB - 1951-02-15, AVW:[id number], [id number]:[id number]  Admit date - 04/12/2013  Admitting Physician Ron Parker, MD  Outpatient Primary MD for the patient is Willow Ora, MD  LOS - 3   Chief Complaint  Patient presents with  . 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 He had head CT scan, MRI of the brain which were stable, he was seen by nephrology, orthopedics and neurology. 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      1.Hyponatremia I agree that this is most likely SIADH versus alcohol abuse, patient and family not forthcoming about his alcohol intake, he is had chronically low sodium for several months, last being 125 one month ago, with normal saline his sodium levels had dropped, his urine and sodium osmolality suggests SIADH he was treated with hypertonic saline for a few hours per renal, hypertonic saline has been stopped as sodium had improved, low-dose Lasix x1 is given on 04/14/2013, note fluid restriction was removed by nephrology on 04/14/2013, sodium is slightly lower  than what it was, will defer further management to nephrology on this issue.     2. Ongoing confusion for weeks likely due to metabolic encephalopathy along with delirium during early morning and night hours - this is multifactorial due to combination of hyponatremia, personal stress, left elbow infection, alcohol consumption. I doubt this is due to Hyponatremia alone as he has had low sodium for a while, B12, TSH are stable, MRI of the brain stable, neuro has been called and they have pursued further workup including EEG, RPR, ceruplasmin level, neuro is following appreciate their input.     3.Leukocytosis nonspecific, could be due to left elbow, resolved on antibiotics.     4.Alcohol use question if he abuses, does not give a straight answer, continue CIWA protocol, folic acid and thiamine.    5. Olecranon bursitis with some cellulitis, was getting worse on Bactrim and then on doxycycline, improving on IV vancomycin, orthopedics following, x-ray of left elbow stable. Note his left elbow was drained as outpatient a few days her to admission.     Code Status: Full  Family Communication: -Wife and Family friend  Disposition Plan: Home  Procedures CT head, MRI MRA brain, left elbow x-ray, EEG ordered   Consults  renal, orthopedics, Neuro   DVT Prophylaxis  Lovenox   Lab Results  Component Value Date   PLT 288 04/15/2013    Medications  Scheduled Meds: . carvedilol  6.25 mg Oral BID WC  . enoxaparin (LOVENOX) injection  40 mg Subcutaneous Q24H  . folic acid  1 mg Oral Daily  . multivitamin with minerals  1 tablet Oral Daily  . thiamine  100 mg Oral Daily   Or  . thiamine  100 mg Intravenous Daily  . vancomycin  1,000 mg Intravenous Q12H   Continuous Infusions:  PRN Meds:.acetaminophen, acetaminophen, alum & mag hydroxide-simeth, aspirin EC, LORazepam, LORazepam, ondansetron (ZOFRAN) IV, ondansetron, zolpidem  Antibiotics     Anti-infectives   Start      Dose/Rate Route Frequency Ordered Stop   04/14/13 1400  sulfamethoxazole-trimethoprim (BACTRIM DS) 800-160 MG per tablet 1 tablet  Status:  Discontinued     1 tablet Oral 3 times per day 04/14/13 1000 04/14/13 1317   04/14/13 1400  vancomycin (VANCOCIN) IVPB 1000 mg/200 mL premix     1,000 mg 200 mL/hr over 60 Minutes Intravenous Every 12 hours 04/14/13 1339     04/14/13 0330  sulfamethoxazole-trimethoprim (BACTRIM DS) 800-160 MG per tablet 1 tablet  Status:  Discontinued     1 tablet Oral Every 12 hours 04/14/13 0327 04/14/13 1000   04/13/13 0315  doxycycline (VIBRA-TABS) tablet 100 mg  Status:  Discontinued     100 mg Oral Every 12 hours 04/13/13 0304 04/14/13 1000       Time Spent in minutes   45   Tatumn Corbridge K M.D on 04/15/2013 at 8:09 AM  Between 7am to 7pm - Pager - [phone]  After 7pm go to [email or web address] - password [email or web address]  And look for the night coverage person covering for me after hours  [facility] Office  [phone]    Subjective:   [name] today has, No headache, No chest pain, No abdominal pain - No Nausea, No new weakness tingling or numbness, No Cough - SOB.    Objective:   Filed Vitals:   04/14/13 2007 04/15/13 0116 04/15/13 0239 04/15/13 0357  BP: 133/81  105/65 134/73  Pulse: 74  70 74  Temp: 97.6 F (36.4 C) 98.1 F (36.7 C)  98.3 F (36.8 C)  TempSrc: Oral Oral  Oral  Resp: 14   17  Height:      Weight:      SpO2: 100%   100%    Wt Readings from Last 3 Encounters:  04/13/13 81.149 kg (178 lb 14.4 oz)  03/17/13 78.019 kg (172 lb)  03/06/12 77.111 kg (170 lb)     Intake/Output Summary (Last 24 hours) at 04/15/13 0809 Last data filed at 04/15/13 0700  Gross per 24 hour  Intake   1240 ml  Output   1690 ml  Net   -450 ml    Exam Awake Alert, Oriented X 3, No new F.N deficits, Normal affect, confusion has improved since yesterday . [place].AT,PERRAL Supple Neck,No JVD, No cervical lymphadenopathy appriciated.   Symmetrical Chest wall movement, Good air movement bilaterally, CTAB RRR,No Gallops,Rubs or new Murmurs, No Parasternal Heave +ve B.Sounds, Abd Soft, Non tender, No organomegaly appriciated, No rebound - guarding or rigidity. No Cyanosis, Clubbing or edema, No new Rash or bruise, left olecranon cellulitis is stable.   Data Review   Micro Results Recent Results (from the  past 240 hour(s))  MRSA PCR SCREENING     Status: None   Collection Time    04/13/13  4:22 AM      Result Value Range Status   MRSA by PCR NEGATIVE  NEGATIVE Final   Comment:            The GeneXpert MRSA Assay (FDA     approved for NASAL specimens     only), is one component of a     comprehensive MRSA colonization     surveillance program. It is not     intended to diagnose MRSA     infection nor to guide or     monitor treatment for     MRSA infections.    Radiology Reports Ct Head Wo Contrast  04/13/2013   *RADIOLOGY REPORT*  Clinical Data: Insomnia, confusion and mind racing.  CT HEAD WITHOUT CONTRAST  Technique:  Contiguous axial images were obtained from the base of the skull through the vertex without contrast.  Comparison: None.  Findings: There is no evidence of acute infarction, mass lesion, or intra- or extra-axial hemorrhage on CT.  Mild white matter hypoattenuation along the left external capsule likely reflects chronic ischemic change.  The posterior fossa, including the cerebellum, brainstem and fourth ventricle, is within normal limits.  The third and lateral ventricles are unremarkable in appearance.  The cerebral hemispheres are symmetric in appearance, with normal gray-white differentiation.  No mass effect or midline shift is seen.  There is no evidence of fracture; visualized osseous structures are unremarkable in appearance.  The visualized portions of the orbits are within normal limits.  The paranasal sinuses and mastoid air cells are well-aerated.  No significant soft tissue abnormalities are  seen.  IMPRESSION:  1.  No acute intracranial pathology seen on CT. 2.  Likely mild chronic ischemic change at the left external capsule.   Original Report Authenticated By: Tonia Ghent, M.D.    [facility]  Recent Labs Lab 04/12/13 2308 04/13/13 0555 04/15/13 0354  WBC 14.5* 13.3* 8.6  HGB 12.9* 13.3 12.3*  HCT 34.4* 35.4* 33.6*  PLT 248 254 288  MCV 84.9 85.9 86.6  MCH 31.9 32.3 31.7  MCHC 37.5* 37.6* 36.6*  RDW 12.1 12.4 12.4    Chemistries   Recent Labs Lab 04/12/13 2308 04/13/13 0555 04/13/13 1024 04/13/13 1619 04/14/13 0348 04/14/13 0925 04/14/13 1532 04/15/13 0354  NA 120* 119* 117* 120* 125* 127* 123* 124*  K 4.5 4.7 4.3 4.5 4.0  --   --  4.4  CL 82* 86* 85* 85* 93*  --   --  90*  CO2 26 20 21 23 22   --   --  24  GLUCOSE 111* 93 127* 98 130*  --   --  95  BUN 6 7 10 11 11   --   --  14  CREATININE 0.83 0.71 0.82 0.89 0.79  --   --  0.79  CALCIUM 9.0 8.8 8.6 9.1 8.6  --   --  8.9  AST 27  --   --   --   --   --   --   --   ALT 21  --   --   --   --   --   --   --   ALKPHOS 57  --   --   --   --   --   --   --   BILITOT 0.3  --   --   --   --   --   --   --    ------------------------------------------------------------------------------------------------------------------  estimated creatinine clearance is 99.4 ml/min (by C-G formula based on Cr of 0.79). ------------------------------------------------------------------------------------------------------------------ No results found for this basename: HGBA1C,  in the last 72 hours ------------------------------------------------------------------------------------------------------------------ No results found for this basename: CHOL, HDL, LDLCALC, TRIG, CHOLHDL, LDLDIRECT,  in the last 72 hours ------------------------------------------------------------------------------------------------------------------  Recent Labs  04/13/13 0050  TSH 1.001    ------------------------------------------------------------------------------------------------------------------  Recent Labs  04/13/13 1619  VITAMINB12 419    Coagulation profile No results found for this basename: INR, PROTIME,  in the last 168 hours  No results found for this basename: DDIMER,  in the last 72 hours  Cardiac Enzymes No results found for this basename: CK, CKMB, TROPONINI, MYOGLOBIN,  in the last 168 hours ------------------------------------------------------------------------------------------------------------------ No components found with this basename: POCBNP,

## 2013-04-15 NOTE — Progress Notes (Addendum)
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  He got 20 mg furosemide po yesterday Several reports of hyponatremia due to trim-sulfa (off now)   Objective: Vital signs in last 24 hours: Blood pressure 144/86, pulse 71, temperature 97.7 Bobby [name] (36.5 C), temperature source Oral, resp. rate 19, height 5\' 11"  (1.803 m), weight 81.149 kg (178 lb 14.4 oz), SpO2 98.00%.    PHYSICAL EXAM General--as above Chest--clear Heart--no rub Abd--nontender Extr--no edema  Lab Results:   Recent Labs Lab 04/13/13 1619 04/14/13 0348 04/14/13 0925 04/14/13 1532 04/15/13 0354  NA 120* 125* 127* 123* 124*  K 4.5 4.0  --   --  4.4  CL 85* 93*  --   --  90*  CO2 23 22  --   --  24  BUN 11 11  --   --  14  CREATININE 0.89 0.79  --   --  0.79  GLUCOSE 98 130*  --   --  95  CALCIUM 9.1 8.6  --   --  8.9     Recent Labs  04/13/13 0555 04/15/13 0354  WBC 13.3* 8.6  HGB 13.3 12.3*  HCT 35.4* 33.6*  PLT 254 288     I have reviewed the patient's current medications. Scheduled: . carvedilol  6.25 mg Oral BID WC  . enoxaparin (LOVENOX) injection  40 mg Subcutaneous Q24H  . folic acid  1 mg Oral Daily  . multivitamin with minerals  1 tablet Oral Daily  . thiamine  100 mg Oral Daily   Or  . thiamine  100 mg Intravenous Daily  . vancomycin  1,000 mg Intravenous Q12H    Assessment/Plan:  1. Hyponatremia- looks euvolemic, no hx of liver / kidney / heart failure; thyroid and adrenal testing wnl. Urine osm elevated but not very  high. CXR and CT head show no mass. Will rx furosemide 40 mg po x1 today 2. AMS-MS better today by my observation and according to wife. Need to watch for dt's.  Seen yesterday by Dr. Wonda Cheng 3. Hx ETOH abuse-see above 4. HTN- takes coreg at home 5. Infected olecranon bursa on L--seen by Dr. Charlane Ferretti  vanco now (off trim-sulfa).   I suspect part of the low Na is from trim-sulfa.  I've d/c'd fluid restriction because I don't think he'll follow it at home and I want to see how he does on ad lib fluids.  40 mg po furosemide today.            LOS: 3 days   Bobby Bobby [name] 04/15/2013,8:46 AM   .labalb

## 2013-04-15 NOTE — Procedures (Signed)
EEG report.  Brief clinical history: 62 years old with cognitive dysfunction and confusion.   Technique: this is a 17 channel routine scalp EEG performed at the bedside with bipolar and monopolar montages arranged in accordance to the international 10/20 system of electrode placement. One channel was dedicated to EKG recording.  The study was performed during wakefulness and drowsiness. Intermittent photic stimulation was utilized as activating procedure.  Description:In the wakeful state, the best background consisted of a medium amplitude, posterior dominant, well sustained, symmetric and reactive 9 Hz rhythm. Drowsiness demonstrated dropout of the alpha rhythm.s. Intermittent photic stimulation did induce a normal driving response.  No focal or generalized epileptiform discharges noted.  No pathologic areas of slowing seen.  EKG showed sinus rhythm.  Impression: this is a normal awake and drowsy EEG. Please, be aware that a normal EEG does not exclude the possibility of epilepsy.  Clinical correlation is advised.  Wyatt Portela, MD

## 2013-04-15 NOTE — Progress Notes (Signed)
Utilization Review Completed. 04/15/2013  

## 2013-04-15 NOTE — Progress Notes (Signed)
Subjective: Pt says left elbow is feeling better.  Started on vanc today.  Doxy and bactrim discontinued.  No n/v/f/c.   Objective: Vital signs in last 24 hours: Temp:  [97.4 F (36.3 C)-98.3 F (36.8 C)] 98.2 F (36.8 C) (05/27 1538) Pulse Rate:  [65-75] 75 (05/27 1538) Resp:  [14-21] 21 (05/27 1538) BP: (105-154)/(65-88) 154/88 mmHg (05/27 1538) SpO2:  [98 %-100 %] 100 % (05/27 1538) Weight:  [76.5 kg (168 lb 10.4 oz)] 76.5 kg (168 lb 10.4 oz) (05/27 0859)  Intake/Output from previous day: 05/26 0701 - 05/27 0700 In: 1400 [P.O.:960; I.V.:40; IV Piggyback:400] Out: 1690 [Urine:1690] Intake/Output this shift: Total I/O In: 1280 [P.O.:1080; IV Piggyback:200] Out: -    Recent Labs  04/12/13 2308 04/13/13 0555 04/15/13 0354  HGB 12.9* 13.3 12.3*    Recent Labs  04/13/13 0555 04/15/13 0354  WBC 13.3* 8.6  RBC 4.12* 3.88*  HCT 35.4* 33.6*  PLT 254 288    Recent Labs  04/15/13 0354 04/15/13 1557  NA 124* 122*  K 4.4 4.2  CL 90* 85*  CO2 24 23  BUN 14 14  CREATININE 0.79 0.85  GLUCOSE 95 96  CALCIUM 8.9 9.3   No results found for this basename: LABPT, INR,  in the last 72 hours  L elbow olecranon bursa with decreased swelling.  Still with some localized erythema.  Full ROM at left elbow.  L forearm swelling resolved.  Skin healthy and intact.  2+ radial and ulnar pulses.  Assessment/Plan: L olecranon bursitis / left elbow cellulitis - continue IV vanc and consider changing to clinda PO for discharge.  I'll check in again tomorrow.   Toni Arthurs 04/15/2013, 4:55 PM

## 2013-04-16 ENCOUNTER — Inpatient Hospital Stay (HOSPITAL_COMMUNITY): Payer: 59

## 2013-04-16 DIAGNOSIS — F322 Major depressive disorder, single episode, severe without psychotic features: Secondary | ICD-10-CM

## 2013-04-16 DIAGNOSIS — E871 Hypo-osmolality and hyponatremia: Secondary | ICD-10-CM

## 2013-04-16 DIAGNOSIS — F29 Unspecified psychosis not due to a substance or known physiological condition: Secondary | ICD-10-CM

## 2013-04-16 DIAGNOSIS — F411 Generalized anxiety disorder: Secondary | ICD-10-CM

## 2013-04-16 DIAGNOSIS — I10 Essential (primary) hypertension: Secondary | ICD-10-CM

## 2013-04-16 DIAGNOSIS — D72829 Elevated white blood cell count, unspecified: Secondary | ICD-10-CM

## 2013-04-16 DIAGNOSIS — F1010 Alcohol abuse, uncomplicated: Secondary | ICD-10-CM

## 2013-04-16 LAB — BASIC METABOLIC PANEL
CO2: 29 mEq/L (ref 19–32)
Calcium: 9.1 mg/dL (ref 8.4–10.5)
Creatinine, Ser: 0.85 mg/dL (ref 0.50–1.35)
Glucose, Bld: 105 mg/dL — ABNORMAL HIGH (ref 70–99)

## 2013-04-16 MED ORDER — LORAZEPAM 2 MG/ML IJ SOLN
INTRAMUSCULAR | Status: AC
  Administered 2013-04-16: 1 mg via INTRAVENOUS
  Filled 2013-04-16: qty 1

## 2013-04-16 MED ORDER — IOHEXOL 300 MG/ML  SOLN
25.0000 mL | INTRAMUSCULAR | Status: AC
  Administered 2013-04-16 (×2): 25 mL via ORAL

## 2013-04-16 MED ORDER — IOHEXOL 300 MG/ML  SOLN
100.0000 mL | Freq: Once | INTRAMUSCULAR | Status: AC | PRN
  Administered 2013-04-16: 80 mL via INTRAVENOUS

## 2013-04-16 MED ORDER — ESCITALOPRAM OXALATE 10 MG PO TABS
10.0000 mg | ORAL_TABLET | Freq: Every day | ORAL | Status: DC
  Administered 2013-04-16 – 2013-04-17 (×2): 10 mg via ORAL
  Filled 2013-04-16 (×5): qty 1

## 2013-04-16 MED ORDER — LORAZEPAM 2 MG/ML IJ SOLN: 1.0000 mg | Freq: Once | INTRAMUSCULAR | Status: AC

## 2013-04-16 MED ORDER — DEMECLOCYCLINE HCL 150 MG PO TABS
300.0000 mg | ORAL_TABLET | Freq: Two times a day (BID) | ORAL | Status: DC
  Administered 2013-04-16 – 2013-04-18 (×5): 300 mg via ORAL
  Filled 2013-04-16 (×7): qty 2

## 2013-04-16 MED ORDER — CLONAZEPAM 0.5 MG PO TABS
0.5000 mg | ORAL_TABLET | Freq: Two times a day (BID) | ORAL | Status: DC
  Administered 2013-04-16 – 2013-04-18 (×4): 0.5 mg via ORAL
  Filled 2013-04-16 (×4): qty 1

## 2013-04-16 MED ORDER — CLONIDINE HCL 0.1 MG PO TABS
0.1000 mg | ORAL_TABLET | Freq: Two times a day (BID) | ORAL | Status: DC
  Filled 2013-04-16 (×2): qty 1

## 2013-04-16 MED ORDER — HYDRALAZINE HCL 20 MG/ML IJ SOLN
10.0000 mg | Freq: Four times a day (QID) | INTRAMUSCULAR | Status: DC | PRN
  Administered 2013-04-16: 10 mg via INTRAVENOUS
  Filled 2013-04-16: qty 1

## 2013-04-16 NOTE — Progress Notes (Signed)
[facility]                                                                                Patient Demographics  Bobby [name], is a 62 y.o. male, DOB - 11-Nov-1951, WJX:[id number], OZH:[id number]  Admit date - 04/12/2013  Admitting Physician Ron Parker, MD  Outpatient Primary MD for the patient is Willow Ora, MD  LOS - 4   Chief Complaint  Patient presents with  . 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 He had head CT scan, MRI of the brain which were stable, he was seen by nephrology, orthopedics and neurology. 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      1.Hyponatremia I agree that this is most likely SIADH versus alcohol abuse, patient and family not forthcoming about his alcohol intake, he is had chronically low sodium for several months, last being 125 one month ago, with normal saline his sodium levels had dropped, his urine and sodium osmolality suggests SIADH he was treated with hypertonic saline for a few hours per renal, hypertonic saline has been stopped as sodium had improved, low-dose Lasix x1 is given on 04/14/2013, note fluid restriction was removed by nephrology on 04/14/2013, demeclocycline added per  Psych and CT with contrast ordered per Dr Scherrie Gerlach request for SIADH workup, will defer further management to nephrology on this issue.     2. Ongoing confusion for weeks likely due to metabolic encephalopathy along with delirium during early morning and night hours - this is multifactorial due to combination of hyponatremia, personal stress, left elbow infection, alcohol consumption. I doubt this is due to Hyponatremia alone as he has had low sodium for a while, B12, TSH, Ceruplasmin  are stable, EEG and  MRI of the brain stable, neuro has been following and recommend further workup outpt, Psych called today to R/O stress vs depression component.Marland Kitchen     3.Leukocytosis nonspecific, could be due to left elbow, resolved on antibiotics.     4.Alcohol use question if he abuses, does not give a straight answer, continue CIWA protocol, folic acid and thiamine.    5. Olecranon bursitis with some cellulitis, was getting worse on Bactrim and then on doxycycline, improving on IV vancomycin, orthopedics following, x-ray of left elbow stable. Note his left elbow was drained as outpatient a few days her to admission.    6. HTN - BP high ,  clonidine scheduled and PRN Hydralazine added, ? Early DTs vs agitation.    7.Depression per wife - psych called.     Code Status: Full  Family Communication: -Wife and Family friend  Disposition Plan: Home   Procedures CT head, MRI MRA brain, left elbow x-ray, EEG ordered, CT Abd-Pelvis   Consults  renal, orthopedics, Neuro, Psych   DVT Prophylaxis  Lovenox   Lab Results  Component Value Date   PLT 288 04/15/2013    Medications  Scheduled Meds: . carvedilol  6.25 mg Oral BID WC  . cloNIDine  0.1 mg Oral BID  . demeclocycline  300 mg Oral Q12H  . enoxaparin (LOVENOX) injection  40 mg Subcutaneous Q24H  . folic acid  1 mg Oral Daily  . LORazepam  1 mg Intravenous Once  . multivitamin with minerals  1 tablet Oral Daily  . thiamine  100 mg  Oral Daily   Or  . thiamine  100 mg Intravenous Daily  . vancomycin  1,000 mg Intravenous Q12H   Continuous Infusions:  PRN Meds:.acetaminophen, acetaminophen, alum & mag hydroxide-simeth, aspirin EC, hydrALAZINE, ondansetron (ZOFRAN) IV, ondansetron, zolpidem  Antibiotics     Anti-infectives   Start     Dose/Rate Route Frequency Ordered Stop   04/16/13 1100  demeclocycline (DECLOMYCIN) tablet 300 mg     300 mg Oral Every 12 hours 04/16/13 1018     04/14/13 1400  sulfamethoxazole-trimethoprim (BACTRIM DS) 800-160 MG per tablet 1 tablet  Status:  Discontinued     1 tablet Oral 3 times per day 04/14/13 1000 04/14/13 1317   04/14/13 1400  vancomycin (VANCOCIN) IVPB 1000 mg/200 mL premix     1,000 mg 200 mL/hr over 60 Minutes Intravenous Every 12 hours 04/14/13 1339     04/14/13 0330  sulfamethoxazole-trimethoprim (BACTRIM DS) 800-160 MG per tablet 1 tablet  Status:  Discontinued     1 tablet Oral Every 12 hours 04/14/13 0327 04/14/13 1000   04/13/13 0315  doxycycline (VIBRA-TABS) tablet 100 mg  Status:  Discontinued     100 mg Oral Every 12 hours 04/13/13 0304 04/14/13 1000       Time Spent in minutes   45   SINGH,PRASHANT K M.D on 04/16/2013 at 1:50 PM  Between 7am to 7pm - Pager - [phone]  After 7pm go to [email or web address] - password [email or web address]  And look for the night coverage person covering for me after hours  [facility] Office  [phone]    Subjective:   Bobby [name] today has, No headache, No chest pain, No abdominal pain - No Nausea, No new weakness tingling or numbness, No Cough - SOB.  Per wife confusion better, but slightly sad.  Objective:   Filed Vitals:   04/16/13 1136 04/16/13 1204 04/16/13 1209 04/16/13 1222  BP: 160/93 165/92 162/87 162/87  Pulse: 75   70  Temp: 97.9 F (36.6 C)     TempSrc: Oral     Resp: 20 12  18   Height:      Weight:      SpO2: 100%       Wt Readings from Last 3 Encounters:  04/16/13 76.4 kg (168 lb 6.9 oz)   03/17/13 78.019 kg (172 lb)  03/06/12 77.111 kg (170 lb)     Intake/Output Summary (Last 24 hours) at 04/16/13 1350 Last data filed at 04/16/13 0756  Gross per 24 hour  Intake    360 ml  Output   1525 ml  Net  -1165  ml    Exam Awake Alert, Oriented X 3, No new F.N deficits, flat affect, confusion has improved since yesterday . [place].AT,PERRAL Supple Neck,No JVD, No cervical lymphadenopathy appriciated.  Symmetrical Chest wall movement, Good air movement bilaterally, CTAB RRR,No Gallops,Rubs or new Murmurs, No Parasternal Heave +ve B.Sounds, Abd Soft, Non tender, No organomegaly appriciated, No rebound - guarding or rigidity. No Cyanosis, Clubbing or edema, No new Rash or bruise, left olecranon cellulitis is stable.   Data Review   Micro Results Recent Results (from the past 240 hour(s))  MRSA PCR SCREENING     Status: None   Collection Time    04/13/13  4:22 AM      Result Value Range Status   MRSA by PCR NEGATIVE  NEGATIVE Final   Comment:            The GeneXpert MRSA Assay (FDA     approved for NASAL specimens     only), is one component of a     comprehensive MRSA colonization     surveillance program. It is not     intended to diagnose MRSA     infection nor to guide or     monitor treatment for     MRSA infections.    Radiology Reports Ct Head Wo Contrast  04/13/2013   *RADIOLOGY REPORT*  Clinical Data: Insomnia, confusion and mind racing.  CT HEAD WITHOUT CONTRAST  Technique:  Contiguous axial images were obtained from the base of the skull through the vertex without contrast.  Comparison: None.  Findings: There is no evidence of acute infarction, mass lesion, or intra- or extra-axial hemorrhage on CT.  Mild white matter hypoattenuation along the left external capsule likely reflects chronic ischemic change.  The posterior fossa, including the cerebellum, brainstem and fourth ventricle, is within normal limits.  The third and lateral ventricles are unremarkable in  appearance.  The cerebral hemispheres are symmetric in appearance, with normal gray-white differentiation.  No mass effect or midline shift is seen.  There is no evidence of fracture; visualized osseous structures are unremarkable in appearance.  The visualized portions of the orbits are within normal limits.  The paranasal sinuses and mastoid air cells are well-aerated.  No significant soft tissue abnormalities are seen.  IMPRESSION:  1.  No acute intracranial pathology seen on CT. 2.  Likely mild chronic ischemic change at the left external capsule.   Original Report Authenticated By: Tonia Ghent, M.D.    [facility]  Recent Labs Lab 04/12/13 2308 04/13/13 0555 04/15/13 0354  WBC 14.5* 13.3* 8.6  HGB 12.9* 13.3 12.3*  HCT 34.4* 35.4* 33.6*  PLT 248 254 288  MCV 84.9 85.9 86.6  MCH 31.9 32.3 31.7  MCHC 37.5* 37.6* 36.6*  RDW 12.1 12.4 12.4    Chemistries   Recent Labs Lab 04/12/13 2308  04/13/13 1619 04/14/13 0348 04/14/13 0925 04/14/13 1532 04/15/13 0354 04/15/13 1557 04/16/13 0420  NA 120*  < > 120* 125* 127* 123* 124* 122* 123*  K 4.5  < > 4.5 4.0  --   --  4.4 4.2 4.5  CL 82*  < > 85* 93*  --   --  90* 85* 85*  CO2 26  < > 23 22  --   --  24 23 29   GLUCOSE 111*  < > 98 130*  --   --  95 96 105*  BUN 6  < > 11 11  --   --  14 14 16  CREATININE 0.83  < > 0.89 0.79  --   --  0.79 0.85 0.85  CALCIUM 9.0  < > 9.1 8.6  --   --  8.9 9.3 9.1  AST 27  --   --   --   --   --   --   --   --   ALT 21  --   --   --   --   --   --   --   --   ALKPHOS 57  --   --   --   --   --   --   --   --   BILITOT 0.3  --   --   --   --   --   --   --   --   < > = values in this interval not displayed. ------------------------------------------------------------------------------------------------------------------ estimated creatinine clearance is 93.5 ml/min (by C-G formula based on Cr of  0.85). ------------------------------------------------------------------------------------------------------------------ No results found for this basename: HGBA1C,  in the last 72 hours ------------------------------------------------------------------------------------------------------------------ No results found for this basename: CHOL, HDL, LDLCALC, TRIG, CHOLHDL, LDLDIRECT,  in the last 72 hours ------------------------------------------------------------------------------------------------------------------ No results found for this basename: TSH, T4TOTAL, FREET3, T3FREE, THYROIDAB,  in the last 72 hours ------------------------------------------------------------------------------------------------------------------  Recent Labs  04/13/13 1619  VITAMINB12 419   Lab Results  Component Value Date   TSH 1.001 04/13/2013     Coagulation profile No results found for this basename: INR, PROTIME,  in the last 168 hours  No results found for this basename: DDIMER,  in the last 72 hours  Cardiac Enzymes No results found for this basename: CK, CKMB, TROPONINI, MYOGLOBIN,  in the last 168 hours ------------------------------------------------------------------------------------------------------------------ No components found with this basename: POCBNP,

## 2013-04-16 NOTE — Progress Notes (Addendum)
1520:  Post Ativan administration and prior to Clonidine; pt BP reduced to systolic 129-146, diastolic 41-80.  Pt resting without complaints.  Nurse contacted MD; instructed nurse to not administer Clonidine as ordered and to d/c order previously written for this pt.      1424: Nurse called MD to inform that post PRN Hydralazine administration; pt remains extremely hypertensive.  BP ranging from 186/101 - 191/104.  Pt is tearful and admits to being stressed, with wife at bedside.  MD instructed nurse to order BID Clonidine with 1mg  of Ativan to be administered immediately.  Nurse comforted both wife and pt and encourage wife not to communicate issues that would cause pt stress.  Nurse will carry out orders as instructed and will continue to monitor pt.

## 2013-04-16 NOTE — Progress Notes (Signed)
Patient has F/U appt at 10 AM with Dr. Terrace Arabia 05/14/13 at [facility] [address] [place], [place] [address]--[phone]

## 2013-04-16 NOTE — Progress Notes (Signed)
Pt transported to CT with wife at bedside.  Pt was calm, oriented, comfortable prior to transport without questions or concerns.  Vital signs prior to transport are documented in epic
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
## 2013-04-16 NOTE — Progress Notes (Signed)
Subjective: No acute changes, continues to be improved compared to admission.   Exam: Filed Vitals:   04/16/13 0755  BP: 156/89  Pulse: 67  Temp: 98.4 F (36.9 C)  Resp: 18   Gen: In bed, NAD MS: Awake, Alert, able to spell world backwards. Oriented to person, year, adn place. Interactive and appropriate.  ZO:XWRU, VFF Motor: MAEW   Impression: 62 yo M with a history of hyponatremia presenting with worsening confusion and Na down to 120. He has a history of hyponatremia, but not typically that low. I suspect tha this confusion was multifactorial and with such improvement, do not feel strongly about further workup at this time. If he were to have worsening with more normal sodiums, then further workup may need to be pursued.   When asked about previous episodes, it sounds as if there was one other during a time of stress  Recommendations: 1) Follow up with neurology as outpatient.  2) Consider neuropsychiatric(psychometric) testing if symptoms persist.  3) Neurology will sign off at this time, please call with any further questions, we will arrange follow up.   Ritta Slot, MD [facility] [phone]  If 7pm- 7am, please page neurology on call at [phone].

## 2013-04-16 NOTE — Progress Notes (Signed)
Nurse called MD to inform of pt rising BP trend, systolic >160.  MD acknowledged nurse concerns regarding BP and instructed nurse to place order for Hydralazine 10mg  q 6 PRN for SBP > 150.  Nurse will place order as instructed, administer when neccessary and will continue to monitor.

## 2013-04-16 NOTE — Progress Notes (Signed)
Subjective: Pt denies any pain at the left elbow.  He says the swelling has improved as well.  No n/v/f/c.  Neuro has signed off.  Renal fxn seems to have stabilized as well.  Currently on Vanc and demeclocycline.   Objective: Vital signs in last 24 hours: Temp:  [97.7 F (36.5 C)-98.4 F (36.9 C)] 97.9 F (36.6 C) (05/28 1136) Pulse Rate:  [67-84] 70 (05/28 1222) Resp:  [12-21] 18 (05/28 1222) BP: (127-165)/(67-94) 162/87 mmHg (05/28 1222) SpO2:  [99 %-100 %] 100 % (05/28 1136) Weight:  [76.4 kg (168 lb 6.9 oz)] 76.4 kg (168 lb 6.9 oz) (05/28 0014)  Intake/Output from previous day: 05/27 0701 - 05/28 0700 In: 1640 [P.O.:1440; IV Piggyback:200] Out: 775 [Urine:775] Intake/Output this shift: Total I/O In: -  Out: 750 [Urine:750]   Recent Labs  04/15/13 0354  HGB 12.3*    Recent Labs  04/15/13 0354  WBC 8.6  RBC 3.88*  HCT 33.6*  PLT 288    Recent Labs  04/15/13 1557 04/16/13 0420  NA 122* 123*  K 4.2 4.5  CL 85* 85*  CO2 23 29  BUN 14 16  CREATININE 0.85 0.85  GLUCOSE 96 105*  CALCIUM 9.3 9.1   No results found for this basename: LABPT, INR,  in the last 72 hours  PE:  L elbow with swollen olecranon bursa.  Slight erythema at the bursa but no cellulitis at the forearm.  sensory function normal.  motor function normal at L UE.  NTTP at bursa.  No significant warmth.  No drainage.  Assessment/Plan: Continue IV vanc as an inpt.  Would consider switching to orals only for a day prior to d/c to see if he has any recurrence while on monotherapy.  Keep pressure to a left elbow to a minimum.  Encouraged to keep it propped on a pillow.  I'd keep him on oral abx for a two week course (12 more days).   I'll plan to see him back in the office in two weeks.  D/c info in epic.  I'll sign off now.  Please call if there are any questions.   [phone].   Toni Arthurs 04/16/2013, 12:58 PM

## 2013-04-16 NOTE — Progress Notes (Signed)
[facility]  Subjective:  Awake, alert, wife at bedside     Objective: Vital signs in last 24 hours: Blood pressure 156/89, pulse 67, temperature 98.4 F (36.9 C), temperature source Oral, resp. rate 18, height 5\' 11"  (1.803 m), weight 76.4 kg (168 lb 6.9 oz), SpO2 99.00%.    PHYSICAL EXAM General--as above  Chest--clear Heart--no rub Abd--nontender Extr--no edema  Lab Results:   Recent Labs Lab 04/15/13 0354 04/15/13 1557 04/16/13 0420  NA 124* 122* 123*  K 4.4 4.2 4.5  CL 90* 85* 85*  CO2 24 23 29   BUN 14 14 16   CREATININE 0.79 0.85 0.85  GLUCOSE 95 96 105*  CALCIUM 8.9 9.3 9.1     Recent Labs  04/15/13 0354  WBC 8.6  HGB 12.3*  HCT 33.6*  PLT 288     I have reviewed the patient's current medications. Scheduled: . carvedilol  6.25 mg Oral BID WC  . enoxaparin (LOVENOX) injection  40 mg Subcutaneous Q24H  . folic acid  1 mg Oral Daily  . multivitamin with minerals  1 tablet Oral Daily  . thiamine  100 mg Oral Daily   Or  . thiamine  100 mg Intravenous Daily  . vancomycin  1,000 mg Intravenous Q12H   Assessment/Plan: 1. Hyponatremia- looks euvolemic, no hx of liver / kidney / heart failure; thyroid and adrenal testing wnl. Urine osm elevated but not very high. CXR and CT head show no mass. Will begin demeclocycline 300 BID.  I'd also suggests CT abd and pelvis with contrast + PSA 2. AMS-MS better today by my observation and according to wife. Need to watch for dt's. Seen by neuro-they plan outpatiernt f/u 3. Hx ETOH abuse-see above 4. HTN- takes coreg at home and here 5. Infected olecranon bursa on L--seen by Dr. Charlane Ferretti vanco now (off trim-sulfa).     .    LOS: 4 days   Mackenzi Krogh F 04/16/2013,10:07 AM   .labalb

## 2013-04-17 LAB — HEAVY METALS, BLOOD: Arsenic: <3 mcg/L (ref <23)

## 2013-04-17 LAB — BASIC METABOLIC PANEL
CO2: 29 mEq/L (ref 19–32)
Calcium: 9.1 mg/dL (ref 8.4–10.5)
Creatinine, Ser: 0.75 mg/dL (ref 0.50–1.35)
Glucose, Bld: 107 mg/dL — ABNORMAL HIGH (ref 70–99)

## 2013-04-17 MED ORDER — CLINDAMYCIN HCL 300 MG PO CAPS
300.0000 mg | ORAL_CAPSULE | Freq: Four times a day (QID) | ORAL | Status: DC
  Administered 2013-04-17 – 2013-04-18 (×6): 300 mg via ORAL
  Filled 2013-04-17 (×9): qty 1

## 2013-04-17 NOTE — Clinical Social Work Psych Note (Signed)
Psych CSW received consult.  Psych CSW gave pt outpatient resources and reviewed with pt per psychiatry.  Pt acknowledged understanding.  Psych CSW signing off.  Please re-consult as necessary.  Vickii Penna, LCSWA [phone]  Clinical Social Work

## 2013-04-17 NOTE — Progress Notes (Signed)
[facility]                                                                                Patient Demographics  Bobby [name], is a 62 y.o. male, DOB - 1951/01/25, RUE:[id number], JYN:[id number]  Admit date - 04/12/2013  Admitting Physician Ron Parker, MD  Outpatient Primary MD for the patient is Willow Ora, MD  LOS - 5   Chief Complaint  Patient presents with  . 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 He had head CT scan, MRI of the brain which were stable, he was seen by nephrology, orthopedics and neurology. 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 Also psych has been called upon wife's request as is some element of work-related stress and depression involved.      1.Hyponatremia I agree that this is most likely SIADH versus alcohol abuse, patient and family not forthcoming about his alcohol intake, he is had chronically low sodium for several months, last being 125 one month ago, with normal saline his sodium levels had dropped, his urine and sodium osmolality suggests SIADH he was treated with hypertonic saline for a few hours per renal, hypertonic saline has been stopped as sodium had improved, low-dose Lasix x1  is given on 04/14/2013, note fluid restriction was removed by nephrology on 04/14/2013, demeclocycline added per Psych and CT with contrast ordered per Dr Scherrie Gerlach request for SIADH workup, will defer further management to nephrology on this issue. Discussed with Dr. Caryn Section again on 04/17/2013, sodium is slightly improved, plan is to continue present care, if sodium is around 128-129 tomorrow he could be discharged.     2. Ongoing confusion for weeks likely due to metabolic encephalopathy along with delirium during early morning and night hours - this is multifactorial due to combination of hyponatremia, personal stress, left elbow infection, alcohol consumption. I doubt this is due to Hyponatremia alone as he has had low sodium for a while, B12, TSH, Ceruplasmin  are stable, EEG and  MRI of the brain stable, neuro has been following and recommend further workup outpt.     3.Leukocytosis nonspecific, could be due to left elbow, resolved on antibiotics.     4.Alcohol use question if he abuses, does not give a straight answer, continue CIWA protocol, folic acid and thiamine.    5. Olecranon bursitis with some cellulitis, was getting worse on Bactrim and  then on doxycycline, improving on IV vancomycin, orthopedics following, x-ray of left elbow stable. Note his left elbow was drained as outpatient a few days her to admission.    6. HTN - BP stable on scheduled Coreg along with when necessary meds as needed.    7.Depression per wife - psych called appreciate their input, he's been placed on Lexapro along with Klonopin.     Code Status: Full  Family Communication: -Wife and Family friend  Disposition Plan: Home   Procedures CT head, MRI MRA brain, left elbow x-ray, EEG ordered, CT Abd-Pelvis   Consults  renal, orthopedics, Neuro, Psych   DVT Prophylaxis  Lovenox   Lab Results  Component Value Date   PLT 288 04/15/2013    Medications  Scheduled Meds: . carvedilol  6.25 mg  Oral BID WC  . clindamycin  300 mg Oral Q6H  . clonazePAM  0.5 mg Oral BID  . demeclocycline  300 mg Oral Q12H  . enoxaparin (LOVENOX) injection  40 mg Subcutaneous Q24H  . escitalopram  10 mg Oral QHS  . folic acid  1 mg Oral Daily  . multivitamin with minerals  1 tablet Oral Daily  . thiamine  100 mg Oral Daily   Or  . thiamine  100 mg Intravenous Daily   Continuous Infusions:  PRN Meds:.acetaminophen, acetaminophen, alum & mag hydroxide-simeth, aspirin EC, hydrALAZINE, ondansetron (ZOFRAN) IV, ondansetron, zolpidem  Antibiotics     Anti-infectives   Start     Dose/Rate Route Frequency Ordered Stop   04/17/13 0845  clindamycin (CLEOCIN) capsule 300 mg     300 mg Oral 4 times per day 04/17/13 0817     04/16/13 1100  demeclocycline (DECLOMYCIN) tablet 300 mg     300 mg Oral Every 12 hours 04/16/13 1018     04/14/13 1400  sulfamethoxazole-trimethoprim (BACTRIM DS) 800-160 MG per tablet 1 tablet  Status:  Discontinued     1 tablet Oral 3 times per day 04/14/13 1000 04/14/13 1317   04/14/13 1400  vancomycin (VANCOCIN) IVPB 1000 mg/200 mL premix  Status:  Discontinued     1,000 mg 200 mL/hr over 60 Minutes Intravenous Every 12 hours 04/14/13 1339 04/17/13 0817   04/14/13 0330  sulfamethoxazole-trimethoprim (BACTRIM DS) 800-160 MG per tablet 1 tablet  Status:  Discontinued     1 tablet Oral Every 12 hours 04/14/13 0327 04/14/13 1000   04/13/13 0315  doxycycline (VIBRA-TABS) tablet 100 mg  Status:  Discontinued     100 mg Oral Every 12 hours 04/13/13 0304 04/14/13 1000       Time Spent in minutes   45   SINGH,PRASHANT K M.D on 04/17/2013 at 9:49 AM  Between 7am to 7pm - Pager - [phone]  After 7pm go to [email or web address] - password [email or web address]  And look for the night coverage person covering for me after hours  [facility] Office  [phone]    Subjective:   Bobby [name] today has, No headache, No chest pain, No abdominal pain - No Nausea, No new weakness  tingling or numbness, No Cough - SOB.  Per wife confusion better, but slightly sad.  Objective:   Filed Vitals:   04/16/13 1800 04/16/13 1944 04/16/13 2326 04/17/13 0350  BP: 122/54 137/72 114/66 137/73  Pulse: 88 84 77 80  Temp:  98.2 F (36.8 C) 98 F (36.7 C) 97.9 F (36.6 C)  TempSrc:  Oral Oral Axillary  Resp: 14 17 19 16   Height:  Weight:      SpO2:   98%     Wt Readings from Last 3 Encounters:  04/16/13 76.4 kg (168 lb 6.9 oz)  03/17/13 78.019 kg (172 lb)  03/06/12 77.111 kg (170 lb)     Intake/Output Summary (Last 24 hours) at 04/17/13 0949 Last data filed at 04/17/13 0002  Gross per 24 hour  Intake    240 ml  Output   2200 ml  Net  -1960 ml    Exam Awake Alert, Oriented X 3, No new F.N deficits, flat affect, confusion has improved since yesterday . [place].AT,PERRAL Supple Neck,No JVD, No cervical lymphadenopathy appriciated.  Symmetrical Chest wall movement, Good air movement bilaterally, CTAB RRR,No Gallops,Rubs or new Murmurs, No Parasternal Heave +ve B.Sounds, Abd Soft, Non tender, No organomegaly appriciated, No rebound - guarding or rigidity. No Cyanosis, Clubbing or edema, No new Rash or bruise, left olecranon cellulitis is stable.   Data Review   Micro Results Recent Results (from the past 240 hour(s))  MRSA PCR SCREENING     Status: None   Collection Time    04/13/13  4:22 AM      Result Value Range Status   MRSA by PCR NEGATIVE  NEGATIVE Final   Comment:            The GeneXpert MRSA Assay (FDA     approved for NASAL specimens     only), is one component of a     comprehensive MRSA colonization     surveillance program. It is not     intended to diagnose MRSA     infection nor to guide or     monitor treatment for     MRSA infections.    Radiology Reports Ct Head Wo Contrast  04/13/2013   *RADIOLOGY REPORT*  Clinical Data: Insomnia, confusion and mind racing.  CT HEAD WITHOUT CONTRAST  Technique:  Contiguous axial images were  obtained from the base of the skull through the vertex without contrast.  Comparison: None.  Findings: There is no evidence of acute infarction, mass lesion, or intra- or extra-axial hemorrhage on CT.  Mild white matter hypoattenuation along the left external capsule likely reflects chronic ischemic change.  The posterior fossa, including the cerebellum, brainstem and fourth ventricle, is within normal limits.  The third and lateral ventricles are unremarkable in appearance.  The cerebral hemispheres are symmetric in appearance, with normal gray-white differentiation.  No mass effect or midline shift is seen.  There is no evidence of fracture; visualized osseous structures are unremarkable in appearance.  The visualized portions of the orbits are within normal limits.  The paranasal sinuses and mastoid air cells are well-aerated.  No significant soft tissue abnormalities are seen.  IMPRESSION:  1.  No acute intracranial pathology seen on CT. 2.  Likely mild chronic ischemic change at the left external capsule.   Original Report Authenticated By: Tonia Ghent, M.D.    [facility]  Recent Labs Lab 04/12/13 2308 04/13/13 0555 04/15/13 0354  WBC 14.5* 13.3* 8.6  HGB 12.9* 13.3 12.3*  HCT 34.4* 35.4* 33.6*  PLT 248 254 288  MCV 84.9 85.9 86.6  MCH 31.9 32.3 31.7  MCHC 37.5* 37.6* 36.6*  RDW 12.1 12.4 12.4    Chemistries   Recent Labs Lab 04/12/13 2308  04/14/13 0348  04/14/13 1532 04/15/13 0354 04/15/13 1557 04/16/13 0420 04/17/13 0415  NA 120*  < > 125*  < > 123* 124* 122* 123* 125*  K 4.5  < >  4.0  --   --  4.4 4.2 4.5 4.2  CL 82*  < > 93*  --   --  90* 85* 85* 87*  CO2 26  < > 22  --   --  24 23 29 29   GLUCOSE 111*  < > 130*  --   --  95 96 105* 107*  BUN 6  < > 11  --   --  14 14 16 14   CREATININE 0.83  < > 0.79  --   --  0.79 0.85 0.85 0.75  CALCIUM 9.0  < > 8.6  --   --  8.9 9.3 9.1 9.1  AST 27  --   --   --   --   --   --   --   --   ALT 21  --   --   --   --   --   --   --   --    ALKPHOS 57  --   --   --   --   --   --   --   --   BILITOT 0.3  --   --   --   --   --   --   --   --   < > = values in this interval not displayed. ------------------------------------------------------------------------------------------------------------------ estimated creatinine clearance is 99.4 ml/min (by C-G formula based on Cr of 0.75). ------------------------------------------------------------------------------------------------------------------ No results found for this basename: HGBA1C,  in the last 72 hours ------------------------------------------------------------------------------------------------------------------ No results found for this basename: CHOL, HDL, LDLCALC, TRIG, CHOLHDL, LDLDIRECT,  in the last 72 hours ------------------------------------------------------------------------------------------------------------------ No results found for this basename: TSH, T4TOTAL, FREET3, T3FREE, THYROIDAB,  in the last 72 hours ------------------------------------------------------------------------------------------------------------------ No results found for this basename: VITAMINB12, FOLATE, FERRITIN, TIBC, IRON, RETICCTPCT,  in the last 72 hours Lab Results  Component Value Date   TSH 1.001 04/13/2013     Coagulation profile No results found for this basename: INR, PROTIME,  in the last 168 hours  No results found for this basename: DDIMER,  in the last 72 hours  Cardiac Enzymes No results found for this basename: CK, CKMB, TROPONINI, MYOGLOBIN,  in the last 168 hours ------------------------------------------------------------------------------------------------------------------ No components found with this basename: POCBNP,

## 2013-04-17 NOTE — Progress Notes (Signed)
Subjective: Pt denies any pain at the left elbow.  No f/c/n/v.  Psych and renal notes reviewed.  Per Dr. Scherrie Gerlach note, the tetracycline currently prescribed is for the patient's sodium rather than to treat his cellulitis.  Objective: Vital signs in last 24 hours: Temp:  [97.9 F (36.6 C)-98.4 F (36.9 C)] 97.9 F (36.6 C) (05/29 0350) Pulse Rate:  [68-88] 80 (05/29 0350) Resp:  [12-21] 16 (05/29 0350) BP: (114-191)/(41-104) 137/73 mmHg (05/29 0350) SpO2:  [96 %-100 %] 98 % (05/28 2326)  Intake/Output from previous day: 05/28 0701 - 05/29 0700 In: 240 [P.O.:240] Out: 2950 [Urine:2950] Intake/Output this shift:     Recent Labs  04/15/13 0354  HGB 12.3*    Recent Labs  04/15/13 0354  WBC 8.6  RBC 3.88*  HCT 33.6*  PLT 288    Recent Labs  04/16/13 0420 04/17/13 0415  NA 123* 125*  K 4.5 4.2  CL 85* 87*  CO2 29 29  BUN 16 14  CREATININE 0.85 0.75  GLUCOSE 105* 107*  CALCIUM 9.1 9.1   No results found for this basename: LABPT, INR,  in the last 72 hours  PE:  L elbow without evident cellulitis.  Full ROM.  5/5 strength at biceps and triceps.  Fluctuance at the olecranon is stable and non tender.  No drainage.  Assessment/Plan: L olecranon bursitis / forearm cellulitis - consider adding clindamycin orally for discharge and d/c vanc.  Avoid pressure on the elbow.  F/u with me in the office.   Toni Arthurs 04/17/2013, 10:21 AM

## 2013-04-17 NOTE — Consult Note (Signed)
Reason for Consult: depression and medication management Referring Physician: [name] is an 62 y.o. male.  HPI: Patient was seen for psychiatric followup. Patient's friend was at bedside and reportedly patient to go wife went outside to fix the car. Patient has responded posture due to the medication without adverse effects. Patient reported the medication was helping him to calm himself down and has a better mood and affect. Patient is willing to participate chemical dependency intensive outpatient program when he was medically stable and released from the hospital. Reportedly psychiatry social services provided outpatient resources required for him.  Mental Status Examination: Patient appeared as per his stated age, awake, alert and oriented x4. He has good eye contact and decreased psychomotor activity. Patient has less anxious and worried mood and his affect was bright and full. He has normal rate, rhythm, and  Low volume of speech. His thought process is linear and goal directed. Patient has denied suicidal, homicidal ideations, intentions or plans. Patient has no evidence of auditory or visual hallucinations, delusions, and paranoia. Patient has fair  to poor insight judgment and impulse control.  Past Medical History  Diagnosis Date  . Hypertension   . Insomnia   . Anxiety     Past Surgical History  Procedure Laterality Date  . Shoulder surgery  1980s    right shoulder    Family History  Problem Relation Age of Onset  . Hypertension Mother   . Stroke Mother   . Colon cancer Neg Hx   . Prostate cancer Neg Hx   . CAD Neg Hx     Social History:  reports that he has never smoked. He has never used smokeless tobacco. He reports that  drinks alcohol. He reports that he does not use illicit drugs.  Allergies:  Allergies  Allergen Reactions  . Tramadol     REACTION: hives    Medications: I have reviewed the patient's current medications.  Results for  orders placed during the hospital encounter of 04/12/13 (from the past 48 hour(s))  BASIC METABOLIC PANEL     Status: Abnormal   Collection Time    04/16/13  4:20 AM      Result Value Range   Sodium 123 (*) 135 - 145 mEq/L   Potassium 4.5  3.5 - 5.1 mEq/L   Chloride 85 (*) 96 - 112 mEq/L   CO2 29  19 - 32 mEq/L   Glucose, Bld 105 (*) 70 - 99 mg/dL   BUN 16  6 - 23 mg/dL   Creatinine, Ser 6.21  0.50 - 1.35 mg/dL   Calcium 9.1  8.4 - 30.8 mg/dL   GFR calc non Af Amer >90  >90 mL/min   GFR calc Af Amer >90  >90 mL/min   Comment:            The eGFR has been calculated     using the CKD EPI equation.     This calculation has not been     validated in all clinical     situations.     eGFR's persistently     <90 mL/min signify     possible Chronic Kidney Disease.  PSA     Status: None   Collection Time    04/16/13 11:00 AM      Result Value Range   PSA 1.12  <=4.00 ng/mL   Comment: (NOTE)     Test Methodology: ECLIA PSA (Electrochemiluminescence Immunoassay)     For PSA  values from 2.5-4.0, particularly in younger men <60 years     old, the AUA and NCCN suggest testing for % Free PSA (3515) and     evaluation of the rate of increase in PSA (PSA velocity).  BASIC METABOLIC PANEL     Status: Abnormal   Collection Time    04/17/13  4:15 AM      Result Value Range   Sodium 125 (*) 135 - 145 mEq/L   Potassium 4.2  3.5 - 5.1 mEq/L   Chloride 87 (*) 96 - 112 mEq/L   CO2 29  19 - 32 mEq/L   Glucose, Bld 107 (*) 70 - 99 mg/dL   BUN 14  6 - 23 mg/dL   Creatinine, Ser 1.61  0.50 - 1.35 mg/dL   Calcium 9.1  8.4 - 09.6 mg/dL   GFR calc non Af Amer >90  >90 mL/min   GFR calc Af Amer >90  >90 mL/min   Comment:            The eGFR has been calculated     using the CKD EPI equation.     This calculation has not been     validated in all clinical     situations.     eGFR's persistently     <90 mL/min signify     possible Chronic Kidney Disease.    Ct Abdomen Pelvis W  Contrast  04/16/2013   *RADIOLOGY REPORT*  Clinical Data: Hyponatremia.  CT ABDOMEN AND PELVIS WITH CONTRAST  Technique:  Multidetector CT imaging of the abdomen and pelvis was performed following the standard protocol during bolus administration of intravenous contrast.  Contrast: 80mL OMNIPAQUE IOHEXOL 300 MG/ML  SOLN  Comparison: None.  Findings: There are a multitude of cysts present throughout the liver.  Most of these are sub centimeter in diameter and are difficult to fully characterize.  There is a more dominant cyst near the gallbladder fossa measuring 4.5 cm and demonstrating benign characteristics.  No definite solid masses are identified in the liver.  There is no evidence of biliary ductal dilatation.  The gallbladder is relatively contracted.  The pancreas, spleen, adrenal glands and kidneys are within normal limits.  No enlarged lymph nodes are seen.   Calcified atherosclerotic plaque of the abdominal aorta and iliac arteries present without evidence of aneurysmal disease.  Bowel loops are unremarkable by CT and show no evidence of inflammation, obstruction or obvious lesion.  Diverticulosis present of the sigmoid colon.  No free fluid or abnormal fluid collections are identified.  No hernias are seen.  The bladder is distended and unremarkable in appearance.  Degenerative changes are present in the lumbar spine with vacuum disc at L4-5 and L5-S1. There is some associated endplate sclerosis at the L5-S1 level.  No focal bony lesions are identified.  IMPRESSION:  1.  No evidence of occult malignancy. 2.  Multitude of small cyst throughout the liver with one dominant 4.5 cm cyst and multiple sub centimeter cysts throughout all segments.  These are likely benign based on appearance with some of the smaller cystic area is too small to characterize by current CT.   Original Report Authenticated By: Irish Lack, M.D.    Positive for anorexia, anxiety, bad mood, depression and excessive alcohol  consumption Blood pressure 139/80, pulse 75, temperature 97.9 F (36.6 C), temperature source Oral, resp. rate 16, height 5\' 11"  (1.803 m), weight 168 lb 6.9 oz (76.4 kg), SpO2 100.00%.   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 Patient does not meet criteria for acute psychiatric hospitalization 2. Patient will be referred to the chemical dependency intensive outpatient program 3. Continue Lexapro 10 mg daily at bedtime 4. Continue clonazepam 0 .5 mg twice daily for anxiety 5. Referred to the psych social worker for discussion regarding chemical dependency intensive outpatient programs 6. Appreciate psychiatric consultation and follow as needed  Gavrielle Streck,JANARDHAHA R. 04/17/2013, 4:49 PM

## 2013-04-17 NOTE — Progress Notes (Signed)
[facility]  Subjective:  Awake, alert, wife says mental status is 95-98% of usual Psych and ortho have left recommendations No GI problems with first doses of demeclocycline for low [Na] CT abd and pelvis--no mass ("multitude of cysts throughout the liver....no evidence of occult malignancy." PSA -wnl   Objective: Vital signs in last 24 hours: Blood pressure 137/73, pulse 80, temperature 97.9 F (36.6 C), temperature source Axillary, resp. rate 16, height 5\' 11"  (1.803 m), weight 76.4 kg (168 lb 6.9 oz), SpO2 98.00%.    PHYSICAL EXAM General--as above  Chest--clear  Heart--no rub  Abd--nontender  Extr--no edema   Lab Results:   Recent Labs Lab 04/15/13 1557 04/16/13 0420 04/17/13 0415  NA 122* 123* 125*  K 4.2 4.5 4.2  CL 85* 85* 87*  CO2 23 29 29   BUN 14 16 14   CREATININE 0.85 0.85 0.75  GLUCOSE 96 105* 107*  CALCIUM 9.3 9.1 9.1     Recent Labs  04/15/13 0354  WBC 8.6  HGB 12.3*  HCT 33.6*  PLT 288     I have reviewed the patient's current medications. Scheduled: . carvedilol  6.25 mg Oral BID WC  . clonazePAM  0.5 mg Oral BID  . demeclocycline  300 mg Oral Q12H  . enoxaparin (LOVENOX) injection  40 mg Subcutaneous Q24H  . escitalopram  10 mg Oral QHS  . folic acid  1 mg Oral Daily  . multivitamin with minerals  1 tablet Oral Daily  . thiamine  100 mg Oral Daily   Or  . thiamine  100 mg Intravenous Daily  . vancomycin  1,000 mg Intravenous Q12H     Assessment/Plan: 1.  Hyponatremia- looks euvolemic, no hx of liver / kidney / heart failure; thyroid and adrenal testing wnl. Urine osm was 276 when serum  [Na] was 119. CXR, CT abd and pelvis, and CT head show no mass.On demeclocycline 300 BID. Ok with me to transfer out of ICU.  If [Na] higher tomorrow ok for d/c and f/u with Dr. Drue Novel 2. AMS-MS better today by my observation and according to wife. Need to watch for dt's. Seen by psych and neuro-they plan outpatiernt f/u 3. Hx ETOH  abuse-see above 4. HTN- takes coreg at home and here 5. Infected olecranon bursa on L--seen by Dr. Charlane Ferretti vanco--he suggests switch tp po antibiotics (demeclocycline is for low [Na]).         LOS: 5 days   Bobby [name] F 04/17/2013,8:07 AM   .labalb

## 2013-04-17 NOTE — Progress Notes (Signed)
Pt transferred from 2600 with confusion/hyponatremia. Pt alert/oriented X3, ambulatory, independent, no pain. Regular diet. Non tele. Oriented to room and call bell system. Left elbow bursitis (open to air), no other skin issues. Will continue to monitor. [place], [employer]

## 2013-04-18 LAB — BASIC METABOLIC PANEL
BUN: 19 mg/dL (ref 6–23)
CO2: 27 mEq/L (ref 19–32)
GFR calc non Af Amer: >90 mL/min (ref >90)
Glucose, Bld: 103 mg/dL — ABNORMAL HIGH (ref 70–99)
Potassium: 4.4 mEq/L (ref 3.5–5.1)

## 2013-04-18 MED ORDER — DEMECLOCYCLINE HCL 150 MG PO TABS: 300.0000 mg | ORAL_TABLET | Freq: Two times a day (BID) | ORAL | Status: DC

## 2013-04-18 MED ORDER — CLINDAMYCIN HCL 300 MG PO CAPS: 300.0000 mg | ORAL_CAPSULE | Freq: Four times a day (QID) | ORAL | Status: DC

## 2013-04-18 MED ORDER — ESCITALOPRAM OXALATE 10 MG PO TABS: 10.0000 mg | ORAL_TABLET | Freq: Every day | ORAL | Status: DC

## 2013-04-18 MED ORDER — ADULT MULTIVITAMIN W/MINERALS CH: 1.0000 | ORAL_TABLET | Freq: Every day | ORAL | Status: AC

## 2013-04-18 MED ORDER — CLONAZEPAM 0.5 MG PO TABS: 0.5000 mg | ORAL_TABLET | Freq: Two times a day (BID) | ORAL | Status: DC

## 2013-04-18 MED ORDER — FOLIC ACID 1 MG PO TABS: 1.0000 mg | ORAL_TABLET | Freq: Every day | ORAL | Status: DC

## 2013-04-18 MED ORDER — THIAMINE HCL 100 MG PO TABS: 100.0000 mg | ORAL_TABLET | Freq: Every day | ORAL | Status: DC

## 2013-04-18 NOTE — Discharge Summary (Signed)
Physician Discharge Summary  Bobby [name]:[id number] DOB: 11-May-1951 DOA: 04/12/2013  PCP: Willow Ora, MD  Admit date: 04/12/2013 Discharge date: 04/18/2013  Time spent: Greater than 30 minutes  Recommendations for Outpatient Follow-up:  1. Dr. Willow Ora, PCP in 5 days with repeat labs (BMP) 2. Dr. Levert Feinstein, Neurology on 05/14/13 at 10 am 3. Dr. Toni Arthurs, Orthopedics in 2 weeks. 4. Dr. Stan Head, GI for evaluation and follow up of Liver cysts. 5. Dr. Zetta Bills, Nephrology as needed for low sodium.  Discharge Diagnoses:  Principal Problem:   Hyponatremia Active Problems:   Essential hypertension, benign   Confusion   Leukocytosis   Alcohol abuse, daily use   Discharge Condition: Improved & Stable  Diet recommendation: Heart Healthy.  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  He has history of low sodium chronically but worse at 127 in April 2014. In the ED, he had Na 120. He had scraped his arms, developed fluid filled blisters on his right elbow which were drained at an Urgent Care Center and he had been placed on Bactrim.  Hospital Course:  1. 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 However the most recent one prior to this admission was 127. 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 His sodium level dropped after normal saline. Nephrology was consulted. He has no history of liver/kidney/heart failure. He also took lots of NSAID's prior to admission and Bactrim which could also cause low sodium.  Patient was moved to Step Down unit and  treated briefly with hypertonic saline.  Once his sodium improved to 125, hypertonic saline was DCed. He was given couple of low dose Lasix. Urine Osm was not very high. CXR & CT Head showed no mass.  He was then started on PO Demeclocycline. CT Abdomen showed no mass & PSA was normal. Nephrology recommended that if sodium continued to improve at OP FU in 1 week, reduce Demeclocycline to 150mg  bid for a week, then 150mg  daily and then stop. Sodium was 128 at discharge 2. Confusion/Cognitive impairment: This was probably multifactorial- due to metabolic encephalopathy from hyponatremia, infection, alcohol abuse and possible psychiatric. CT & MRI head showed no significant abnormalities. B12, RPR, TSH, Ceruplasmin are stable, EEG was normal. Neurology consulted. He was treated for underlying causes and on day of discharge, according to his wife, his confusion had resolved. He might have some cognitive impairment which could be related to alcohol abuse. Neurology did not think that spinal tap was indicated at this time. They did however recommended OP Neurology FU to pursue full neuropsychological testing and perhaps metabolic brain imaging. 3. Leukocytosis nonspecific, could be due to left elbow, resolved on antibiotics.  4. Alcohol Abuse: Evasive regarding extent of use. No overt DT's in hospital. Counseled regarding abstinence- he & spouse verbalized understanding.   5. Left Olecranon bursitis with some cellulitis:  was getting worse on Bactrim and then on doxycycline. He was started on IV vancomycin.  Orthopedics was consulted. X-ray of left elbow stable. Of note his left elbow was drained as  outpatient. He was then switched to PO Clindamycin to complete total 2 week course of Abx. He is to follow up with Orthopedics in 2 weeks. 6. HTN - BP stable on scheduled Coreg.  7. Major Depressive disorder and Anxiety disorder: Psychiatry was consulted. He did not meet criteria for acute Psychiatric hospitalization. He  was referred to [facility]OP. Lexapro & Clonazepam were started based on Psychiatrist recommendation.   Procedures:  None  Consultations:  Orthopedics  Psychiatry  Nephrology  Neurology  Discharge Exam:  Complaints: Denies any specific complaints. Just anxious of returning home and work.  Filed Vitals:   04/17/13 1632 04/17/13 1921 04/18/13 0500 04/18/13 0504  BP: 139/80 121/76  137/79  Pulse:  78  84  Temp: 97.9 F (36.6 C) 98.8 F (37.1 C)  98.6 F (37 C)  TempSrc: Oral Oral  Oral  Resp:  16  18  Height:  6' (1.829 m)    Weight:  73 kg (160 lb 15 oz) 73.029 kg (161 lb)   SpO2:  100%  100%     General exam: Comfortable. Sitting in bed talking to spouse and friend.  Respiratory system: Clear. No increased work of breathing.  Cardiovascular system: S1 and S2 heard, RRR. No JVD, murmurs or pedal edema.  Gastrointestinal system: Abdomen is nondistended, soft and nontender. Normal bowel sounds heard.  Central nervous system: Alert and oriented. No focal neurological deficits.  Extremities: Symmetric 5 x 5 power. Left elbow shows mildly swollen and erythematous olecranon bursa but no warmth or tenderness. Cellulitis of rest or forearm has resolved.  Psychiatry: does not appear anxious. Affect pleasant and appropriate.    Discharge Instructions      Discharge Orders   Future Appointments Provider Department Dept Phone   05/14/2013 10:30 AM Levert Feinstein, MD [facility] [phone]   Future Orders Complete By Expires     Call MD for:  redness, tenderness, or signs of infection (pain, swelling, redness, odor or green/yellow discharge around incision site)  As directed     Call MD for:  severe uncontrolled pain  As directed     Call MD for:  temperature >100.4  As directed     Call MD for:  As directed     Comments:      Confusion.    Diet - low sodium heart healthy  As directed     Increase activity slowly  As directed         Medication List     STOP taking these medications       sulfamethoxazole-trimethoprim 800-160 MG per tablet  Commonly known as:  BACTRIM DS      TAKE these medications       carvedilol 6.25 MG tablet  Commonly known as:  COREG  Take 6.25 mg by mouth 2 (two) times daily with a meal.     clindamycin 300 MG capsule  Commonly known as:  CLEOCIN  Take 1 capsule (300 mg total) by mouth every 6 (six) hours.     clonazePAM 0.5 MG tablet  Commonly known as:  KLONOPIN  Take 1 tablet (0.5 mg total) by mouth 2 (two) times daily.     demeclocycline 150 MG tablet  Commonly known as:  DECLOMYCIN  Take 2 tablets (300 mg total) by mouth every 12 (twelve) hours.     escitalopram 10 MG tablet  Commonly known as:  LEXAPRO  Take 1 tablet (10 mg total) by mouth at bedtime.     folic  acid 1 MG tablet  Commonly known as:  FOLVITE  Take 1 tablet (1 mg total) by mouth daily.     multivitamin with minerals Tabs  Take 1 tablet by mouth daily.     thiamine 100 MG tablet  Take 1 tablet (100 mg total) by mouth daily.     zolpidem 10 MG tablet  Commonly known as:  AMBIEN  Take 5 mg by mouth at bedtime as needed for sleep.       Follow-up Information   Follow up with Levert Feinstein, MD On 05/14/2013. (at 10 AM)    Contact information:   [address] [place] [address] [phone]       Follow up with Toni Arthurs, MD. Schedule an appointment as soon as possible for a visit in 2 weeks.   Contact information:   [address] [place] [address] [phone]       Follow up with Dagoberto Ligas., MD. Schedule an appointment as soon as possible for a visit in 1 week. (Low sodium)    Contact information:   [address] [facility] [place] [address] [phone]       Follow up with Stan Head, MD. Schedule an appointment as soon as possible for a visit in 1 week. (Liver cyst)    Contact information:   [address] [place] [address] [phone]        Follow up with Willow Ora, MD. Schedule an appointment as soon as possible for a visit in 5 days. (To be seen with repeat labs (BMP))    Contact information:   [address] [facility] [address] [place] [address] [phone]        The results of significant diagnostics from this hospitalization (including imaging, microbiology, ancillary and laboratory) are listed below for reference.    Significant Diagnostic Studies: Dg Chest 2 View  04/13/2013   *RADIOLOGY REPORT*  Clinical Data: Cough, confusion  CHEST - 2 VIEW  Comparison: None.  Findings: Normal mediastinum and cardiac silhouette.  Normal pulmonary  vasculature.  No evidence of effusion, infiltrate, or pneumothorax.  No acute bony abnormality.  Internal fixation of right shoulder  IMPRESSION: No acute cardiopulmonary process.   Original Report Authenticated By: Genevive Bi, M.D.   Dg Elbow 2 Views Left  04/14/2013   *RADIOLOGY REPORT*  Clinical Data: Left-sided elbow pain and swelling, limited range of motion  LEFT ELBOW - 2 VIEW  Comparison: None.  Findings:  No displaced fracture or elbow joint effusion.  There is soft tissue swelling about the olecranon process of the elbow.  No subcutaneous emphysema radiopaque foreign body.  IMPRESSION:  Soft tissue swelling about the olecranon process of the elbow without associated displaced fracture or elbow joint effusion.   Original Report Authenticated By: Tacey Ruiz, MD   Ct Head Wo Contrast  04/13/2013   *RADIOLOGY REPORT*  Clinical Data: Insomnia, confusion and mind racing.  CT HEAD WITHOUT CONTRAST  Technique:  Contiguous axial images were obtained from the base of the skull through the vertex without contrast.  Comparison: None.  Findings: There is no evidence of acute infarction, mass lesion, or intra- or extra-axial hemorrhage on CT.  Mild white matter hypoattenuation along the left external capsule likely reflects chronic ischemic change.  The posterior fossa, including  the cerebellum, brainstem and fourth ventricle, is within normal limits.  The third and lateral ventricles are unremarkable in appearance.  The cerebral hemispheres are  symmetric in appearance, with normal gray-white differentiation.  No mass effect or midline shift is seen.  There is no evidence of fracture; visualized osseous structures are unremarkable in appearance.  The visualized portions of the orbits are within normal limits.  The paranasal sinuses and mastoid air cells are well-aerated.  No significant soft tissue abnormalities are seen.  IMPRESSION:  1.  No acute intracranial pathology seen on CT. 2.  Likely mild chronic ischemic change at the left external capsule.   Original Report Authenticated By: Tonia Ghent, M.D.   Mr [facility] Wo Contrast  04/13/2013   *RADIOLOGY REPORT*  Clinical Data:  Confusion  MRI HEAD WITHOUT CONTRAST MRA HEAD WITHOUT CONTRAST  Technique:  Multiplanar, multiecho pulse sequences of the brain and surrounding structures were obtained without intravenous contrast. Angiographic images of the head were obtained using MRA technique without contrast.  Comparison:  Head CT 04/12/2013  MRI HEAD  Findings:  Diffusion imaging does not show any acute or subacute infarction.  No brainstem or cerebellar abnormality.  The cerebral hemispheres are normal except for a few punctate foci of signal in the frontal lobe white matter, not likely clinical relevance.  No mass lesion, hemorrhage, hydrocephalus or extra-axial collection. No pituitary mass.  No inflammatory sinus disease.  IMPRESSION: Normal examination for a patient of this age.  No acute finding. Few white matter foci in the frontal lobes not likely of clinical relevance.  MRA HEAD  Findings: Both internal carotid arteries are widely patent into the brain.  The anterior and middle cerebral vessels are normal without proximal stenosis, aneurysm or vascular malformation.  Both vertebral arteries are patent to the basilar.  No basilar  stenosis. Posterior circulation branch vessels appear normal.  IMPRESSION: Normal intracranial MR angiography of the large and medium-sized vessels.   Original Report Authenticated By: Paulina Fusi, M.D.   Mr Brain Wo Contrast  04/13/2013   *RADIOLOGY REPORT*  Clinical Data:  Confusion  MRI HEAD WITHOUT CONTRAST MRA HEAD WITHOUT CONTRAST  Technique:  Multiplanar, multiecho pulse sequences of the brain and surrounding structures were obtained without intravenous contrast. Angiographic images of the head were obtained using MRA technique without contrast.  Comparison:  Head CT 04/12/2013  MRI HEAD  Findings:  Diffusion imaging does not show any acute or subacute infarction.  No brainstem or cerebellar abnormality.  The cerebral hemispheres are normal except for a few punctate foci of signal in the frontal lobe white matter, not likely clinical relevance.  No mass lesion, hemorrhage, hydrocephalus or extra-axial collection. No pituitary mass.  No inflammatory sinus disease.  IMPRESSION: Normal examination for a patient of this age.  No acute finding. Few white matter foci in the frontal lobes not likely of clinical relevance.  MRA HEAD  Findings: Both internal carotid arteries are widely patent into the brain.  The anterior and middle cerebral vessels are normal without proximal stenosis, aneurysm or vascular malformation.  Both vertebral arteries are patent to the basilar.  No basilar stenosis. Posterior circulation branch vessels appear normal.  IMPRESSION: Normal intracranial MR angiography of the large and medium-sized vessels.   Original Report Authenticated By: Paulina Fusi, M.D.   Ct Abdomen Pelvis W Contrast  04/16/2013   *RADIOLOGY REPORT*  Clinical Data: Hyponatremia.  CT ABDOMEN AND PELVIS WITH CONTRAST  Technique:  Multidetector CT imaging of the abdomen and pelvis was performed following the standard protocol during bolus administration of intravenous contrast.  Contrast: 80mL OMNIPAQUE IOHEXOL 300  MG/ML  SOLN  Comparison: None.  Findings: There are a multitude of cysts present throughout the liver.  Most of these are sub centimeter in diameter and are difficult to fully characterize.  There is a more dominant cyst near the gallbladder fossa measuring 4.5 cm and demonstrating benign characteristics.  No definite solid masses are identified in the liver.  There is no evidence of biliary ductal dilatation.  The gallbladder is relatively contracted.  The pancreas, spleen, adrenal glands and kidneys are within normal limits.  No enlarged lymph nodes are seen.   Calcified atherosclerotic plaque of the abdominal aorta and iliac arteries present without evidence of aneurysmal disease.  Bowel loops are unremarkable by CT and show no evidence of inflammation, obstruction or obvious lesion.  Diverticulosis present of the sigmoid colon.  No free fluid or abnormal fluid collections are identified.  No hernias are seen.  The bladder is distended and unremarkable in appearance.  Degenerative changes are present in the lumbar spine with vacuum disc at L4-5 and L5-S1. There is some associated endplate sclerosis at the L5-S1 level.  No focal bony lesions are identified.  IMPRESSION:  1.  No evidence of occult malignancy. 2.  Multitude of small cyst throughout the liver with one dominant 4.5 cm cyst and multiple sub centimeter cysts throughout all segments.  These are likely benign based on appearance with some of the smaller cystic area is too small to characterize by current CT.   Original Report Authenticated By: Irish Lack, M.D.    Microbiology: Recent Results (from the past 240 hour(s))  MRSA PCR SCREENING     Status: None   Collection Time    04/13/13  4:22 AM      Result Value Range Status   MRSA by PCR NEGATIVE  NEGATIVE Final   Comment:            The GeneXpert MRSA Assay (FDA     approved for NASAL specimens     only), is one component of a     comprehensive MRSA colonization     surveillance  program. It is not     intended to diagnose MRSA     infection nor to guide or     monitor treatment for     MRSA infections.     Labs: Basic Metabolic Panel:  Recent Labs Lab 04/15/13 0354 04/15/13 1557 04/16/13 0420 04/17/13 0415 04/18/13 0430  NA 124* 122* 123* 125* 128*  K 4.4 4.2 4.5 4.2 4.4  CL 90* 85* 85* 87* 91*  CO2 24 23 29 29 27   GLUCOSE 95 96 105* 107* 103*  BUN 14 14 16 14 19   CREATININE 0.79 0.85 0.85 0.75 0.78  CALCIUM 8.9 9.3 9.1 9.1 9.2   Liver Function Tests:  Recent Labs Lab 04/12/13 2308  AST 27  ALT 21  ALKPHOS 57  BILITOT 0.3  PROT 7.0  ALBUMIN 3.6   No results found for this basename: LIPASE, AMYLASE,  in the last 168 hours  Recent Labs Lab 04/12/13 2358  AMMONIA <10*   CBC:  Recent Labs Lab 04/12/13 2308 04/13/13 0555 04/15/13 0354  WBC 14.5* 13.3* 8.6  HGB 12.9* 13.3 12.3*  HCT 34.4* 35.4* 33.6*  MCV 84.9 85.9 86.6  PLT 248 254 288   Cardiac Enzymes: No results found for this basename: CKTOTAL, CKMB, CKMBINDEX, TROPONINI,  in the last 168 hours BNP: BNP (last 3 results) No results found for this basename: PROBNP,  in the last 8760 hours CBG: No results found for this  basename: GLUCAP,  in the last 168 hours  Additional labs:  Ceruloplasmin: 36  B 12: 419  Serum Osm: 267  TSH: 1.001  Plasma Cortisol: 16.9  PSA: 1.12  RPR: Non Reactive.  Urine Na: 85.  Urine Osm: 276  Arsenic < 3, Lead < 2, Mercury 6  UDS: Negative.  Blood Alcohol Level: 92   Signed:  Allan Bacigalupi  [facility] 04/18/2013, 12:51 PM

## 2013-04-18 NOTE — Progress Notes (Signed)
NURSING PROGRESS NOTE  Bobby [name] [id number] Discharge Data: 04/18/2013 3:45 PM Attending Provider: Elease Etienne, MD JYN:WGNF Drue Novel, MD     [name] to be D/C'd Home   per MD order.    All IV's discontinued with no bleeding noted.  All belongings returned to patient for patient to take home.   Last Vital Signs:  Blood pressure 137/79, pulse 84, temperature 98.6 F (37 C), temperature source Oral, resp. rate 18, height 6' (1.829 m), weight 73.029 kg (161 lb), SpO2 100.00%.  Discharge Medication List   Medication List    STOP taking these medications       sulfamethoxazole-trimethoprim 800-160 MG per tablet  Commonly known as:  BACTRIM DS      TAKE these medications       carvedilol 6.25 MG tablet  Commonly known as:  COREG  Take 6.25 mg by mouth 2 (two) times daily with a meal.     clindamycin 300 MG capsule  Commonly known as:  CLEOCIN  Take 1 capsule (300 mg total) by mouth every 6 (six) hours.     clonazePAM 0.5 MG tablet  Commonly known as:  KLONOPIN  Take 1 tablet (0.5 mg total) by mouth 2 (two) times daily.     demeclocycline 150 MG tablet  Commonly known as:  DECLOMYCIN  Take 2 tablets (300 mg total) by mouth every 12 (twelve) hours.     escitalopram 10 MG tablet  Commonly known as:  LEXAPRO  Take 1 tablet (10 mg total) by mouth at bedtime.     folic acid 1 MG tablet  Commonly known as:  FOLVITE  Take 1 tablet (1 mg total) by mouth daily.     multivitamin with minerals Tabs  Take 1 tablet by mouth daily.     thiamine 100 MG tablet  Take 1 tablet (100 mg total) by mouth daily.     zolpidem 10 MG tablet  Commonly known as:  AMBIEN  Take 5 mg by mouth at bedtime as needed for sleep.        Madelin Rear, MSN, RN, [employer]

## 2013-04-18 NOTE — Clinical Social Work Psych Assess (Signed)
Clinical Social Work Department CLINICAL SOCIAL WORK PSYCHIATRY SERVICE LINE ASSESSMENT 04/18/2013  Patient:  Bobby [name]  Account:  [id number]  Admit Date:  04/12/2013  Clinical Social Worker:  Read Drivers  Date/Time:  04/18/2013 02:30 PM Referred by:  Physician  Date referred:  04/18/2013 Reason for Referral  Substance Abuse  Behavioral Health Issues   Presenting Symptoms/Problems (In the person's/family's own words):   Pt is requesting treatment/ resources for outpatient ETOH abuse as well as psychiatry referrals.   Abuse/Neglect/Trauma History (check all that apply)  Denies history   Abuse/Neglect/Trauma Comments:   none reported or charted   Psychiatric History (check all that apply)  Denies history   Psychiatric medications:  zolpidem (AMBIEN) tablet 5 mg, 5 mg, Oral, QHS PRN, Ron Parker, MD, 5 mg at 04/15/13 2301; clonazePAM (KLONOPIN) tablet 0.5 mg, 0.5 mg, Oral, BID   Current Mental Health Hospitalizations/Previous Mental Health History:   none reported or charted   Current provider:   none reported or charted   Place and Date:   none reported or charted   Current Medications:   Current facility-administered medications:acetaminophen (TYLENOL) suppository 650 mg, 650 mg, Rectal, Q6H PRN, Ron Parker, MD;  acetaminophen (TYLENOL) tablet 650 mg, 650 mg, Oral, Q6H PRN, Ron Parker, MD, 650 mg at 04/16/13 0407;  alum & mag hydroxide-simeth (MAALOX/MYLANTA) 200-200-20 MG/5ML suspension 30 mL, 30 mL, Oral, Q6H PRN, Ron Parker, MD  aspirin EC tablet 325 mg, 325 mg, Oral, Q6H PRN, Calvert Cantor, MD, 325 mg at 04/13/13 2118;  carvedilol (COREG) tablet 6.25 mg, 6.25 mg, Oral, BID WC, Ron Parker, MD, 6.25 mg at 04/18/13 1035;  clindamycin (CLEOCIN) capsule 300 mg, 300 mg, Oral, Q6H, Leroy Sea, MD, 300 mg at 04/18/13 1230;  clonazePAM (KLONOPIN) tablet 0.5 mg, 0.5 mg, Oral, BID, Nehemiah Settle, MD, 0.5 mg at  04/18/13 1036  demeclocycline (DECLOMYCIN) tablet 300 mg, 300 mg, Oral, Q12H, Zada Girt, MD, 300 mg at 04/18/13 1036; enoxaparin (LOVENOX) injection 40 mg, 40 mg, Subcutaneous, Q24H, Ron Parker, MD, 40 mg at 04/17/13 1025; escitalopram (LEXAPRO) tablet 10 mg, 10 mg, Oral, QHS, Nehemiah Settle, MD, 10 mg at 04/17/13 2312; folic acid (FOLVITE) tablet 1 mg, 1 mg, Oral, Daily, Ron Parker, MD, 1 mg at 04/18/13 1036  hydrALAZINE (APRESOLINE) injection 10 mg, 10 mg, Intravenous, Q6H PRN, Leroy Sea, MD, 10 mg at 04/16/13 1222;  multivitamin with minerals tablet 1 tablet, 1 tablet, Oral, Daily, Ron Parker, MD, 1 tablet at 04/18/13 1036;  ondansetron (ZOFRAN) injection 4 mg, 4 mg, Intravenous, Q6H PRN, Ron Parker, MD;  ondansetron (ZOFRAN) tablet 4 mg, 4 mg, Oral, Q6H PRN, Ron Parker, MD  thiamine (VITAMIN B-1) tablet 100 mg, 100 mg, Oral, Daily, Ron Parker, MD, 100 mg at 04/18/13 1036;  zolpidem (AMBIEN) tablet 5 mg, 5 mg, Oral, QHS PRN, Ron Parker, MD, 5 mg at 04/15/13 2301   Previous Impatient Admission/Date/Reason:   Emotional Health / Current Symptoms    Suicide/Self Harm  None reported   Suicide attempt in the past:   none reported or charted   Other harmful behavior:   none reported or charted   Psychotic/Dissociative Symptoms  None reported   Other Psychotic/Dissociative Symptoms:   none reported or charted    Attention/Behavioral Symptoms  Within Normal Limits   Other Attention / Behavioral Symptoms:   none reported or charted    Cognitive Impairment  Orientation - Time  Orientation - Place  Orientation - Self  Orientation - Situation  Poor/Impaired Decision-Making   Other Cognitive Impairment:   none reported or noted    Mood and Adjustment  Mood Congruent    Stress, Anxiety, Trauma, Any Recent Loss/Stressor  None reported   Anxiety (frequency):   none reported or charted   Phobia  (specify):   none reported or charted   Compulsive behavior (specify):   none reported or charted   Obsessive behavior (specify):   none reported or charted   Other:   none reported or charted   Substance Abuse/Use  None   SBIRT completed (please refer for detailed history):  N  Self-reported substance use:   none reported or charted  UDS - nothing detected   Urinary Drug Screen Completed:  Y Alcohol level:   BAL 92 upon d/c    Environmental/Housing/Living Arrangement  Stable housing   Who is in the home:   wife, Bobby [name]   Emergency contact:  Bobby [name], wife, [phone]   Financial  Stable Income  Private Insurance   Patient's Strengths and Goals (patient's own words):   Pt is compliant with medical advice and recommendations. pt has supportive wife and private insurance.  Pt acknowledges that he needs help with ETOH abuse.   Clinical Social Worker's Interpretive Summary:   Psych CSW assessed pt at bedside.  Pt wife, Bobby [name] sitting on side of bed.  Psych CSW obtained verbal consent from pt to speak in front of wife.  Pt and pt wife very cooperative and paticipatory during assessment. Pt reports that he has issues with drinking.  He drinks daily and wants to stop. He reports his father being an alcoholic and he not wanting the same path for his life.  Psych CSW provided Chemical Dependency/ETOH dependency/tx outpatient referrals.  Psych CSW not only handed the referrals to the pt, but reviewed them intently.  Psych CSW also advised pt to go online or to call his insurance company [facility]) to obtain a psychiatric physician listing ensuring the psychiatrist he chose to see was in network with his insurance company.  No other psych needs identified.   Disposition:  Psych Clinical Social Worker signing off  Vickii Penna, [place] [phone]  Clinical Social Work

## 2013-04-18 NOTE — Evaluation (Signed)
Physical Therapy Evaluation Patient Details Name: Bobby [name] MRN: [id number] DOB: 1951-02-03 Today's Date: 04/18/2013 Time: 5784-6962 PT Time Calculation (min): 12 min  PT Assessment / Plan / Recommendation Clinical Impression  Pt is I with all mobility with transfers, gait, and stairs.  Reviewed back exercises with pt who has history of back pain.    PT Assessment  Patent does not need any further PT services    Follow Up Recommendations  No PT follow up    Does the patient have the potential to tolerate intense rehabilitation      Barriers to Discharge        Equipment Recommendations  None recommended by PT    Recommendations for Other Services     Frequency      Precautions / Restrictions     Pertinent Vitals/Pain No pain      Mobility  Ambulation/Gait Ambulation/Gait Assistance: 7: Independent Assistive device: None Ambulation/Gait Assistance Details: Challenged gait with head turns and head up and down with no LOB. Demonstrated good balance in room in tight spaces. Gait Pattern: Within Functional Limits Stairs: Yes Stairs Assistance: 6: Modified independent (Device/Increase time) Stair Management Technique: No rails Number of Stairs: 12    Exercises     PT Diagnosis:    PT Problem List:   PT Treatment Interventions:     PT Goals    Visit Information  Last PT Received On: 04/18/13 Assistance Needed: +1    Subjective Data  Subjective: "My back hurts a little bit, cause I haven't been doing my back exercises the last few days."  Wife present.  Pt dressed in street clothes. Patient Stated Goal: Go home   Prior Functioning  Home Living Lives With: Spouse Type of Home: House Home Layout: Two level Prior Function Level of Independence: Independent Able to Take Stairs?: Yes Vocation: Full time employment Communication Communication: No difficulties    Cognition  Cognition Arousal/Alertness: Awake/alert Behavior During Therapy: WFL for  tasks assessed/performed Overall Cognitive Status: Within Functional Limits for tasks assessed    Extremity/Trunk Assessment Right Lower Extremity Assessment RLE ROM/Strength/Tone: WFL for tasks assessed Left Lower Extremity Assessment LLE ROM/Strength/Tone: WFL for tasks assessed   Balance Balance Balance Assessed: Yes Dynamic Standing Balance Dynamic Standing - Balance Support: No upper extremity supported Dynamic Standing - Level of Assistance: 7: Independent  End of Session PT - End of Session Activity Tolerance: Patient tolerated treatment well Patient left: with family/visitor present Nurse Communication: Mobility status  GP     Bobby [name] 04/18/2013, 11:50 AM

## 2013-04-18 NOTE — Progress Notes (Signed)
[name]  Subjective:  Awake, alert, wonders whether he can go home   Objective: Vital signs in last 24 hours: Blood pressure 137/79, pulse 84, temperature 98.6 F (37 C), temperature source Oral, resp. rate 18, height 6' (1.829 m), weight 73.029 kg (161 lb), SpO2 100.00%.    PHYSICAL EXAM General--as above  Chest--clear  Heart--no rub  Abd--nontender  Extr--no edema   Lab Results:   Recent Labs Lab 04/16/13 0420 04/17/13 0415 04/18/13 0430  NA 123* 125* 128*  K 4.5 4.2 4.4  CL 85* 87* 91*  CO2 29 29 27   BUN 16 14 19   CREATININE 0.85 0.75 0.78  GLUCOSE 105* 107* 103*  CALCIUM 9.1 9.1 9.2    No results found for this basename: WBC, HGB, HCT, PLT,  in the last 72 hours   I have reviewed the patient's current medications. Scheduled: . carvedilol  6.25 mg Oral BID WC  . clindamycin  300 mg Oral Q6H  . clonazePAM  0.5 mg Oral BID  . demeclocycline  300 mg Oral Q12H  . enoxaparin (LOVENOX) injection  40 mg Subcutaneous Q24H  . escitalopram  10 mg Oral QHS  . folic acid  1 mg Oral Daily  . multivitamin with minerals  1 tablet Oral Daily  . thiamine  100 mg Oral Daily    Assessment/Plan: 1. Hyponatremia- looks euvolemic, no hx of liver / kidney / heart failure; thyroid and adrenal testing wnl. Urine osm was 276 when serum [Na] was 119. CXR, CT abd and pelvis, and CT head show no mass.On demeclocycline 300 BID. Na getting better--Ok with me to d/c today.  He should  f/u with Dr. Drue Novel next week. If Na better next week cut demeclocycline to 150 BID, in another week to 150/d and in another week ok to d/c.  I told him to stop drinking alcohol.  Wife says they may get bicycles as substitute! 2. AMS-MS better today by my observation and according to wife.No dt's. Seen by psych and neuro-they plan outpatient f/u 3. Hx ETOH abuse-see above 4. HTN- takes coreg at home and here 5. Infected olecranon bursa on L--seen by Dr. Charlane Ferretti vanco--he suggests  switch tp po antibiotics (demeclocycline is for low [Na]).     LOS: 6 days   [name] F 04/18/2013,1:20 PM   .labalb

## 2013-04-18 NOTE — Care Management Note (Signed)
    Page 1 of 1   04/18/2013     12:24:44 PM   CARE MANAGEMENT NOTE 04/18/2013  Patient:  Bobby [name], Bobby [name]   Account Number:  [id number]  Date Initiated:  04/15/2013  Documentation initiated by:  Alvira Philips Assessment:   62 yr-old male adm with dx of hyponatremia; lives with spouse     Action/Plan:   pt eval- pt has not pt needs   Anticipated DC Date:  04/18/2013   Anticipated DC Plan:  HOME/SELF CARE      DC Planning Services  CM consult      Choice offered to / List presented to:             Status of service:  Completed, signed off Medicare Important Message given?   (If response is "NO", the following Medicare IM given date fields will be blank) Date Medicare IM given:   Date Additional Medicare IM given:    Discharge Disposition:  HOME/SELF CARE  Per UR Regulation:  Reviewed for med. necessity/level of care/duration of stay  If discussed at Long Length of Stay Meetings, dates discussed:    Comments:  PCP:  Dr Willow Ora  04/18/13 12:16 Letha Cape RN, BSN [phone] patient lives with spouse, pta indep.  Per physical therapy patient has no pt needs.  Patient has medication coverage and transportation at discharge.  Physch CSW gave patient resources for outpt follow up per her note.

## 2013-04-22 ENCOUNTER — Telehealth: Payer: Self-pay | Admitting: Internal Medicine

## 2013-04-23 ENCOUNTER — Encounter: Payer: Self-pay | Admitting: Internal Medicine

## 2013-04-23 ENCOUNTER — Telehealth: Payer: Self-pay | Admitting: *Deleted

## 2013-04-23 ENCOUNTER — Ambulatory Visit (INDEPENDENT_AMBULATORY_CARE_PROVIDER_SITE_OTHER): Payer: 59 | Admitting: Internal Medicine

## 2013-04-23 VITALS — BP 126/82 | HR 69 | Wt 165.0 lb

## 2013-04-23 DIAGNOSIS — F29 Unspecified psychosis not due to a substance or known physiological condition: Secondary | ICD-10-CM

## 2013-04-23 DIAGNOSIS — R410 Disorientation, unspecified: Secondary | ICD-10-CM

## 2013-04-23 DIAGNOSIS — F411 Generalized anxiety disorder: Secondary | ICD-10-CM

## 2013-04-23 DIAGNOSIS — M7022 Olecranon bursitis, left elbow: Secondary | ICD-10-CM

## 2013-04-23 DIAGNOSIS — M7020 Olecranon bursitis, unspecified elbow: Secondary | ICD-10-CM | POA: Insufficient documentation

## 2013-04-23 DIAGNOSIS — F1010 Alcohol abuse, uncomplicated: Secondary | ICD-10-CM

## 2013-04-23 DIAGNOSIS — E871 Hypo-osmolality and hyponatremia: Secondary | ICD-10-CM

## 2013-04-23 MED ORDER — DEMECLOCYCLINE HCL 150 MG PO TABS: 300.0000 mg | ORAL_TABLET | Freq: Two times a day (BID) | ORAL | Status: DC

## 2013-04-23 MED ORDER — CLONAZEPAM 0.5 MG PO TABS: 0.2500 mg | ORAL_TABLET | Freq: Two times a day (BID) | ORAL | Status: DC | PRN

## 2013-04-23 NOTE — Assessment & Plan Note (Signed)
  Mental status changes, Hospital workup negative, wife questions the need for a followup w/  neurology, they were told by the last neurologist that no followup was needed. They will cancel the outpatient followup.

## 2013-04-23 NOTE — Assessment & Plan Note (Signed)
Very vague  on his description of alcohol intake. Recommend complete abstinence

## 2013-04-23 NOTE — Telephone Encounter (Signed)
Ok to drink to thirst

## 2013-04-23 NOTE — Telephone Encounter (Signed)
After pt & his wife finished with the OV today they had another question. They wanted to know if the pt should be on any fluid restrictions due to his sodium. Please advise.

## 2013-04-23 NOTE — Patient Instructions (Addendum)
Next visit in one month Please see a counselor; you can make an appointment with Raynelle Fanning

## 2013-04-23 NOTE — Assessment & Plan Note (Signed)
Hyponatremia, Check a BMP. Sodium was significantly low in the hospital, will refer to nephrology ( Dr Caryn Section) for outpatient checkup. Further advise w/ results. RF demeclocycline

## 2013-04-23 NOTE — Assessment & Plan Note (Signed)
Anxiety-depression  Better on Lexapro 10 mg, declined increase dose. Taking clonazepam half tablet twice a day very seldom. He uses Ambien at night. Wife is concerned about clonazepam, I explained that there is indeed a potential for abuse but is ok to take a low dose as needed, new prescription provided. Also recommend to see a counselor. See instructions.

## 2013-04-23 NOTE — Progress Notes (Signed)
  Subjective:    Patient ID: Bobby [name], male    DOB: 16-Oct-1951, 62 y.o.   MRN: [id number]  HPI Hospital followup, here with his wife, admitted from 04/12/2013 to 04/18/2013. She was seen with the following problems: Severe hyponatremia, nephrology was consulted, workup included a r/o malignancy with a chest x-ray, PSA, CT head and  abdomen. All x-rays were okay. Etiology  was not completely clear but possibly due to alcohol intake, bactrim use and SIADH. Was discharged on demeclocycline  Mental status changes, felt to be multifactorial including hyponatremia, stress, alcohol and infection.  Depression, psychiatry was consulted. ++ stress , work related  Alcohol use, apparently is drinking more than what he is letting us know, no overt DTs .  He had L olecranon bursitis prior to admission, was taking Bactrim, the consulted  orthopedic surgery, on abx x 2 weeks     Past Medical History  Diagnosis Date  . Hypertension   . Insomnia   . Anxiety and depression   . Hyponatremia 03-2013    admited    Past Surgical History  Procedure Laterality Date  . Shoulder surgery  1980s    right shoulder   History   Social History  . Marital Status: Single    Spouse Name: N/A    Number of Children: 2  . Years of Education: N/A   Occupational History  . bussines owner     Social History Main Topics  . Smoking status: Never Smoker   . Smokeless tobacco: Never Used  . Alcohol Use: Yes     Comment: socially , abuse? heavy?  . Drug Use: No  . Sexually Active: Not on file   Other Topics Concern  . Not on file   Social History Narrative   "[name]", Married, 2 children   mom passed away 24-May-2008                   Review of Systems Since he left the hospital, he is doing well. Mental status is basically back to normal according to the wife. Depression anxiety improve with Lexapro and sporadic use of clonazepam. Not suicidal. Has completely stopped alcohol since he left the  hospital, I asked him specifically how much was drinking, his response was vague " I' drink more often when I'm stressed". He's been  taken antibiotics for olecranon bursitis, he gradually improved but is not better in the last 4 days.    Objective:   Physical Exam BP 126/82  Pulse 69  Wt 165 lb (74.844 kg)  BMI 22.37 kg/m2  SpO2 97%  General -- alert, well-developed, nad .    Lungs -- normal respiratory effort, no intercostal retractions, no accessory muscle use, and normal breath sounds.   Heart-- normal rate, regular rhythm, no murmur, and no gallop.   Extremities-- no pretibial edema bilaterally. L olecranon swollen, no tender-warm, slt fluctuant, no d/c noted.  Neurologic-- alert & oriented X3 and strength normal in all extremities. Psych-- Cognition and judgment appear intact. Alert and cooperative with normal attention span and concentration.  not anxious appearing and not depressed appearing.       Assessment & Plan:

## 2013-04-23 NOTE — Assessment & Plan Note (Signed)
Olecranon  Bursitis, refer  to orthopedic surgery Dr Dagoberto Ligas . Cont po abx

## 2013-04-24 LAB — BASIC METABOLIC PANEL
CO2: 26 mEq/L (ref 19–32)
Chloride: 92 mEq/L — ABNORMAL LOW (ref 96–112)
Creatinine, Ser: 0.9 mg/dL (ref 0.4–1.5)
Sodium: 126 mEq/L — ABNORMAL LOW (ref 135–145)

## 2013-04-24 NOTE — Telephone Encounter (Signed)
Discussed with pt's wife. 

## 2013-04-29 ENCOUNTER — Telehealth: Payer: Self-pay | Admitting: Internal Medicine

## 2013-04-29 NOTE — Telephone Encounter (Signed)
Patient needs clear ence for left olecranon bursitis surgery, discussed anesthesia, they recommend Na+ to be at least 128.  Advise patient: Come back to the office today for a BMP, if sodium Is still, we'll ask Dr. Caryn Section on further advise

## 2013-04-29 NOTE — Telephone Encounter (Signed)
lmovm for pt to return call.  

## 2013-04-30 NOTE — Telephone Encounter (Signed)
lmovm for pt to return call.  

## 2013-05-01 ENCOUNTER — Other Ambulatory Visit (INDEPENDENT_AMBULATORY_CARE_PROVIDER_SITE_OTHER): Payer: 59

## 2013-05-01 ENCOUNTER — Telehealth: Payer: Self-pay | Admitting: *Deleted

## 2013-05-01 DIAGNOSIS — Z789 Other specified health status: Secondary | ICD-10-CM

## 2013-05-01 DIAGNOSIS — IMO0001 Reserved for inherently not codable concepts without codable children: Secondary | ICD-10-CM

## 2013-05-01 LAB — BASIC METABOLIC PANEL
BUN: 17 mg/dL (ref 6–23)
CO2: 29 mEq/L (ref 19–32)
Chloride: 95 mEq/L — ABNORMAL LOW (ref 96–112)
GFR: 103.42 mL/min (ref >60.00)
Glucose, Bld: 77 mg/dL (ref 70–99)
Potassium: 4.6 mEq/L (ref 3.5–5.1)

## 2013-05-01 NOTE — Telephone Encounter (Signed)
Patient here for lab appt and daughter provided office with copy of [facility]-POA paperwork.

## 2013-05-02 NOTE — Telephone Encounter (Signed)
Pt came in 6.12.14 to have labs completed.

## 2013-05-05 ENCOUNTER — Other Ambulatory Visit: Payer: 59

## 2013-05-12 ENCOUNTER — Telehealth: Payer: Self-pay | Admitting: *Deleted

## 2013-05-12 ENCOUNTER — Other Ambulatory Visit (INDEPENDENT_AMBULATORY_CARE_PROVIDER_SITE_OTHER): Payer: 59

## 2013-05-12 DIAGNOSIS — E870 Hyperosmolality and hypernatremia: Secondary | ICD-10-CM

## 2013-05-12 DIAGNOSIS — E871 Hypo-osmolality and hyponatremia: Secondary | ICD-10-CM

## 2013-05-12 LAB — BASIC METABOLIC PANEL
BUN: 18 mg/dL (ref 6–23)
CO2: 27 mEq/L (ref 19–32)
Calcium: 8.8 mg/dL (ref 8.4–10.5)
Chloride: 98 mEq/L (ref 96–112)
Creatinine, Ser: 1 mg/dL (ref 0.4–1.5)
Glucose, Bld: 97 mg/dL (ref 70–99)

## 2013-05-12 MED ORDER — ESCITALOPRAM OXALATE 20 MG PO TABS: 10.0000 mg | ORAL_TABLET | Freq: Every day | ORAL | Status: DC

## 2013-05-12 MED ORDER — THIAMINE HCL 100 MG PO TABS: 100.0000 mg | ORAL_TABLET | Freq: Every day | ORAL | Status: DC

## 2013-05-12 MED ORDER — FOLIC ACID 1 MG PO TABS: 1.0000 mg | ORAL_TABLET | Freq: Every day | ORAL | Status: DC

## 2013-05-12 NOTE — Telephone Encounter (Signed)
Refill request for lexapro 20mg  1/2 tab daily, folic acid 1mg  tab daily, thiamine 100mg  1 tab daily.  Refill done.

## 2013-05-14 ENCOUNTER — Ambulatory Visit: Payer: Self-pay | Admitting: Neurology

## 2013-05-14 ENCOUNTER — Telehealth: Payer: Self-pay | Admitting: Internal Medicine

## 2013-05-14 NOTE — Telephone Encounter (Signed)
Discussed results with pt.

## 2013-05-14 NOTE — Telephone Encounter (Signed)
Patient is calling back about his lab results.

## 2013-05-15 ENCOUNTER — Other Ambulatory Visit: Payer: Self-pay | Admitting: Internal Medicine

## 2013-05-15 NOTE — Telephone Encounter (Signed)
Ok to refill? Last OV 6.4.14

## 2013-05-16 NOTE — Telephone Encounter (Signed)
Spoke to pharmacy & pt still has refills left on file. Refill request sent in error.  

## 2013-05-26 ENCOUNTER — Encounter: Payer: Self-pay | Admitting: Internal Medicine

## 2013-05-26 ENCOUNTER — Ambulatory Visit (INDEPENDENT_AMBULATORY_CARE_PROVIDER_SITE_OTHER): Payer: 59 | Admitting: Internal Medicine

## 2013-05-26 VITALS — BP 135/88 | HR 66 | Temp 97.6°F | Wt 168.6 lb

## 2013-05-26 DIAGNOSIS — F411 Generalized anxiety disorder: Secondary | ICD-10-CM

## 2013-05-26 DIAGNOSIS — M7022 Olecranon bursitis, left elbow: Secondary | ICD-10-CM

## 2013-05-26 DIAGNOSIS — E871 Hypo-osmolality and hyponatremia: Secondary | ICD-10-CM

## 2013-05-26 DIAGNOSIS — I10 Essential (primary) hypertension: Secondary | ICD-10-CM

## 2013-05-26 DIAGNOSIS — F1010 Alcohol abuse, uncomplicated: Secondary | ICD-10-CM

## 2013-05-26 DIAGNOSIS — M7020 Olecranon bursitis, unspecified elbow: Secondary | ICD-10-CM

## 2013-05-26 MED ORDER — CLONAZEPAM 0.5 MG PO TABS: 0.2500 mg | ORAL_TABLET | Freq: Three times a day (TID) | ORAL | Status: DC | PRN

## 2013-05-26 MED ORDER — DEMECLOCYCLINE HCL 150 MG PO TABS: 300.0000 mg | ORAL_TABLET | Freq: Two times a day (BID) | ORAL | Status: DC

## 2013-05-26 NOTE — Progress Notes (Signed)
  Subjective:    Patient ID: Bobby [name], male    DOB: [birth year], 62 y.o.   MRN: [id number]  HPI ROV, here w/ his wife Anxiety, still having stress and work, on Lexapro and clonazepam  half tablet 3 times a day. Alcohol intake: Has not drink any alcohol since the last office visit except for 2 glasses of wine. Hyponatremia, nephrology has not seen the patient yet. Bursitis, saw orthopedic surgery, he was doing better, was recommended to followup when necessary. Surgery was not done.  Past Medical History  Diagnosis Date  . Hypertension   . Insomnia   . Anxiety and depression   . Hyponatremia 03-2013    admited    Past Surgical History  Procedure Laterality Date  . Shoulder surgery  1980s    right shoulder    Review of Systems Good medication compliance with all meds. No mental status changes. Ambulatory BPs 120/70.     Objective:   Physical Exam  General -- alert, well-developed, NAD .   Lungs -- normal respiratory effort, no intercostal retractions, no accessory muscle use, and normal breath sounds.   Heart-- normal rate, regular rhythm, no murmur, and no gallop.   Extremities--left elbow, previously seen bursitis Has significantly decreased in size and is not read, tender, warm Extremities-- no pretibial edema bilaterally  Psych-- Cognition and judgment appear intact. Alert and cooperative with normal attention span and concentration.  not anxious appearing and not depressed appearing.        Assessment & Plan:

## 2013-05-26 NOTE — Assessment & Plan Note (Signed)
Still under a lot of stress, on Lexapro 10 Milligrams daily and clonazepam half tablet 3 times a day. At this point, symptoms are relatively well controlled, wife reports he is handling the stress better. Plan: no change

## 2013-05-26 NOTE — Assessment & Plan Note (Signed)
Saw orthopedic surgery, was doing better, surgery was not performed. At this point he's improved, recommend to call if symptoms resurface.

## 2013-05-26 NOTE — Assessment & Plan Note (Signed)
Well-controlled, no change 

## 2013-05-26 NOTE — Patient Instructions (Addendum)
Next visit in 2 months  

## 2013-05-26 NOTE — Assessment & Plan Note (Signed)
Has not drink any alcohol for the last month except for 2 glasses of wine

## 2013-05-26 NOTE — Assessment & Plan Note (Signed)
Slightly better but not resolved. Labs Nephrology eval pending. RF meds

## 2013-05-27 LAB — BASIC METABOLIC PANEL
BUN: 15 mg/dL (ref 6–23)
Calcium: 9.2 mg/dL (ref 8.4–10.5)
Creatinine, Ser: 0.9 mg/dL (ref 0.4–1.5)
GFR: 88 mL/min (ref >60.00)
Glucose, Bld: 112 mg/dL — ABNORMAL HIGH (ref 70–99)

## 2013-05-28 ENCOUNTER — Telehealth: Payer: Self-pay | Admitting: *Deleted

## 2013-05-28 DIAGNOSIS — E871 Hypo-osmolality and hyponatremia: Secondary | ICD-10-CM

## 2013-05-28 NOTE — Telephone Encounter (Signed)
Discussed with patient, lab orders entered. Pt. To call office to schedule appointment in 4 weeks for lab work.

## 2013-05-28 NOTE — Telephone Encounter (Signed)
Message copied by Shirlee More I on Wed May 28, 2013  8:33 AM ------      Message from: PAZ, Elita Quick E      Created: Tue May 27, 2013  2:09 PM       Advise patient, sodium is a stable at 131 .      Continue with same medications and come back in 4 weeks for repeat a BMP DX hyponatremia. ------

## 2013-06-05 NOTE — Telephone Encounter (Signed)
Error

## 2013-06-10 ENCOUNTER — Other Ambulatory Visit: Payer: 59

## 2013-06-19 ENCOUNTER — Other Ambulatory Visit (INDEPENDENT_AMBULATORY_CARE_PROVIDER_SITE_OTHER): Payer: 59

## 2013-06-19 DIAGNOSIS — E871 Hypo-osmolality and hyponatremia: Secondary | ICD-10-CM

## 2013-06-19 LAB — BASIC METABOLIC PANEL
Calcium: 8.9 mg/dL (ref 8.4–10.5)
Creatinine, Ser: 1 mg/dL (ref 0.4–1.5)
GFR: 80.84 mL/min (ref >60.00)
Sodium: 134 mEq/L — ABNORMAL LOW (ref 135–145)

## 2013-06-23 ENCOUNTER — Telehealth: Payer: Self-pay | Admitting: *Deleted

## 2013-06-23 DIAGNOSIS — E871 Hypo-osmolality and hyponatremia: Secondary | ICD-10-CM

## 2013-06-23 NOTE — Telephone Encounter (Signed)
Message copied by Shirlee More I on Mon Jun 23, 2013  9:59 AM ------      Message from: Willow Ora E      Created: Thu Jun 19, 2013  3:04 PM       Advise patient:      Sodium increased to 134, almost normal.      Plan:       Continue with same meds      Check a BMP in one month (unless he sees nephrology, then nephrology to decide what to do next) ------

## 2013-06-23 NOTE — Telephone Encounter (Signed)
Discussed with patient, verbalized understanding. Orders placed and apt. Scheduled.

## 2013-07-01 ENCOUNTER — Telehealth: Payer: Self-pay | Admitting: *Deleted

## 2013-07-01 MED ORDER — CLONAZEPAM 0.5 MG PO TABS: 0.2500 mg | ORAL_TABLET | Freq: Three times a day (TID) | ORAL | Status: DC | PRN

## 2013-07-01 NOTE — Telephone Encounter (Signed)
Okay #60 and one refill 

## 2013-07-01 NOTE — Telephone Encounter (Signed)
Patient is requesting refill on Klonopin. Date of last OV was 05/26/13 and last refill was also 05/26/13 for #60 no additional refills. Agreement in place and pt is low risk on UDS. Okay to refill?

## 2013-07-02 NOTE — Telephone Encounter (Signed)
rx faxed to pharmacy

## 2013-07-07 ENCOUNTER — Ambulatory Visit: Payer: 59 | Admitting: Internal Medicine

## 2013-07-15 ENCOUNTER — Ambulatory Visit: Payer: 59 | Admitting: Nurse Practitioner

## 2013-07-16 ENCOUNTER — Telehealth: Payer: Self-pay | Admitting: *Deleted

## 2013-07-16 DIAGNOSIS — E871 Hypo-osmolality and hyponatremia: Secondary | ICD-10-CM

## 2013-07-17 NOTE — Telephone Encounter (Signed)
erorr

## 2013-07-22 ENCOUNTER — Encounter: Payer: Self-pay | Admitting: Internal Medicine

## 2013-07-22 ENCOUNTER — Telehealth: Payer: Self-pay | Admitting: *Deleted

## 2013-07-22 ENCOUNTER — Telehealth: Payer: Self-pay | Admitting: Internal Medicine

## 2013-07-22 DIAGNOSIS — E871 Hypo-osmolality and hyponatremia: Secondary | ICD-10-CM

## 2013-07-22 NOTE — Telephone Encounter (Signed)
Ok with me, thanks.

## 2013-07-22 NOTE — Telephone Encounter (Signed)
Patients wife came to the office with multiple complaints, including attempting to contact our office and was "on hold for 52 minutes before actually speaking with a real person." She also received a letter with a no show fee of $50.00, which she is also asking be resolved as they have never missed an appt.  I advised that this would be waived.   Patients wife also asked that we do additional labs requested by the nephrologist (serum and urine osmolality), orders have been entered. Patient has lab appt on 07/31/13, will discuss results at OV with Dr.Paz on 08/01/13

## 2013-07-22 NOTE — Telephone Encounter (Signed)
Follow up to Sandra's documentation 07/22/2013.  Emailed request to [employer] to remove the $50 No Show fee from 04/11/13.

## 2013-07-28 ENCOUNTER — Other Ambulatory Visit: Payer: 59

## 2013-07-31 ENCOUNTER — Other Ambulatory Visit (INDEPENDENT_AMBULATORY_CARE_PROVIDER_SITE_OTHER): Payer: BC Managed Care – PPO

## 2013-07-31 DIAGNOSIS — E871 Hypo-osmolality and hyponatremia: Secondary | ICD-10-CM

## 2013-07-31 LAB — BASIC METABOLIC PANEL
BUN: 16 mg/dL (ref 6–23)
Chloride: 97 mEq/L (ref 96–112)
Creatinine, Ser: 0.9 mg/dL (ref 0.4–1.5)
GFR: 95.07 mL/min (ref >60.00)
Potassium: 4.5 mEq/L (ref 3.5–5.1)

## 2013-08-01 ENCOUNTER — Ambulatory Visit (INDEPENDENT_AMBULATORY_CARE_PROVIDER_SITE_OTHER): Payer: BC Managed Care – PPO | Admitting: Internal Medicine

## 2013-08-01 ENCOUNTER — Encounter: Payer: Self-pay | Admitting: Internal Medicine

## 2013-08-01 VITALS — BP 136/79 | HR 77 | Temp 98.3°F | Wt 176.4 lb

## 2013-08-01 DIAGNOSIS — F411 Generalized anxiety disorder: Secondary | ICD-10-CM

## 2013-08-01 DIAGNOSIS — E871 Hypo-osmolality and hyponatremia: Secondary | ICD-10-CM

## 2013-08-01 NOTE — Progress Notes (Signed)
  Subjective:    Patient ID: LAVONNE CASS, male    DOB: 1951-10-22, 62 y.o.   MRN: [id number]  HPI Here to discuss BMP.  Past Medical History  Diagnosis Date  . Hypertension   . Insomnia   . Anxiety and depression   . Hyponatremia 03-2013    admited    Past Surgical History  Procedure Laterality Date  . Shoulder surgery  1980s    right shoulder    Review of Systems In general feeling well, anxiety is well controlled. Since the last time he was here he had a "flareup" of elbow inflammation this time on the right side, he self started antibiotics and the inflamation went away quickly    Objective:   Physical Exam  General -- alert, well-developed, NAD.  Psych-- Cognition and judgment appear intact. Alert and cooperative with normal attention span and concentration. not anxious appearing and not depressed appearing.       Assessment & Plan:

## 2013-08-01 NOTE — Assessment & Plan Note (Addendum)
Labs : sodium 132. Have a followup with nephrology in few days. Recommend to stick with no excessive salt intake  and fluid restriction. Will do labs here before next nephrology visit for the pt's convenience.  Today , I spent more than 15 min with the patient, >50% of the time counseling

## 2013-08-01 NOTE — Patient Instructions (Signed)
Please come and get your labs before the next time you see your kidney doctor Followup with me in 4-5 months

## 2013-08-01 NOTE — Assessment & Plan Note (Addendum)
Symptoms well-controlled, he takes clonazepam as needed and Lexapro. UDS  low risk 02/2013. Wife concerned about Lexapro and hyponatremia, hyponatremia started before he started SSRIs, most SSRIs will cause some degree of hyponatremia. At this time I recommend to continue with SSRIs (benefit>>risk) Patient in agreement, will come back in 3 months for reassessment.

## 2013-08-02 ENCOUNTER — Encounter: Payer: Self-pay | Admitting: Internal Medicine

## 2013-08-04 ENCOUNTER — Telehealth: Payer: Self-pay | Admitting: *Deleted

## 2013-08-04 NOTE — Telephone Encounter (Signed)
Pt notified. Labs faxed to pts nephrologist. [facility]

## 2013-08-04 NOTE — Telephone Encounter (Signed)
Message copied by Eustace Quail on Mon Aug 04, 2013  9:06 AM ------      Message from: Willow Ora E      Created: Fri Aug 01, 2013 10:57 AM       Please fax results to the patient's nephrologist.      Also let patient know his sodium is still low ------

## 2013-08-16 ENCOUNTER — Other Ambulatory Visit: Payer: Self-pay | Admitting: Internal Medicine

## 2013-08-18 ENCOUNTER — Telehealth: Payer: Self-pay | Admitting: *Deleted

## 2013-08-18 DIAGNOSIS — G4700 Insomnia, unspecified: Secondary | ICD-10-CM

## 2013-08-18 MED ORDER — ZOLPIDEM TARTRATE 10 MG PO TABS: ORAL_TABLET | ORAL | Status: DC

## 2013-08-18 NOTE — Telephone Encounter (Signed)
Medication Refill: zolpidem (AMBIEN) 10 MG tablet Last OV: 08/01/2013 Last Refill: 05/15/13 (2 refills issued) Next UDS: 09/16/2013  Please advise.

## 2013-08-18 NOTE — Telephone Encounter (Signed)
Med refilled per Dr. Drue Novel

## 2013-08-18 NOTE — Telephone Encounter (Signed)
Ok 30 and 4 RF 

## 2013-09-10 ENCOUNTER — Other Ambulatory Visit (INDEPENDENT_AMBULATORY_CARE_PROVIDER_SITE_OTHER): Payer: BC Managed Care – PPO

## 2013-09-10 DIAGNOSIS — E871 Hypo-osmolality and hyponatremia: Secondary | ICD-10-CM

## 2013-09-10 LAB — BASIC METABOLIC PANEL
CO2: 29 mEq/L (ref 19–32)
Calcium: 8.8 mg/dL (ref 8.4–10.5)
GFR: 85.76 mL/min (ref >60.00)
Glucose, Bld: 87 mg/dL (ref 70–99)
Potassium: 5.1 mEq/L (ref 3.5–5.1)
Sodium: 134 mEq/L — ABNORMAL LOW (ref 135–145)

## 2013-10-21 ENCOUNTER — Telehealth: Payer: Self-pay | Admitting: *Deleted

## 2013-10-21 ENCOUNTER — Other Ambulatory Visit: Payer: Self-pay | Admitting: Internal Medicine

## 2013-10-21 MED ORDER — CLONAZEPAM 0.5 MG PO TABS: 0.2500 mg | ORAL_TABLET | Freq: Three times a day (TID) | ORAL | Status: DC | PRN

## 2013-10-21 NOTE — Telephone Encounter (Signed)
RF done Ask pt to get a UDS

## 2013-10-21 NOTE — Telephone Encounter (Signed)
Clonazepam 0.5 mg Take 0.5 tablets  by mouth 3 (three) times daily as needed for anxiety. Last refill: 07/01/13 #60, 1 refill Last OV: 08/01/13 UDS due, low risk

## 2013-10-22 ENCOUNTER — Telehealth: Payer: Self-pay | Admitting: *Deleted

## 2013-10-22 NOTE — Telephone Encounter (Signed)
error 

## 2013-11-05 ENCOUNTER — Telehealth: Payer: Self-pay | Admitting: Internal Medicine

## 2013-11-05 MED ORDER — ESCITALOPRAM OXALATE 10 MG PO TABS: 10.0000 mg | ORAL_TABLET | Freq: Every day | ORAL | Status: DC

## 2013-11-05 NOTE — Telephone Encounter (Signed)
rx refilled per protocol. DJR  

## 2013-11-05 NOTE — Telephone Encounter (Signed)
Patient wife called and requested a refill for escitalopram (LEXAPRO) 10 MG tablet  Pharmacy [id number] - [place], [place] - [address]

## 2013-11-07 ENCOUNTER — Other Ambulatory Visit: Payer: Self-pay | Admitting: Internal Medicine

## 2013-11-07 NOTE — Telephone Encounter (Signed)
rx refilled per protocol. DJr

## 2013-11-18 ENCOUNTER — Telehealth: Payer: Self-pay | Admitting: *Deleted

## 2013-11-18 ENCOUNTER — Other Ambulatory Visit: Payer: BC Managed Care – PPO

## 2013-11-18 ENCOUNTER — Other Ambulatory Visit: Payer: Self-pay | Admitting: Internal Medicine

## 2013-11-18 DIAGNOSIS — E871 Hypo-osmolality and hyponatremia: Secondary | ICD-10-CM

## 2013-11-18 NOTE — Telephone Encounter (Signed)
Reviewed patients chart, future orders for bmp already placed by Dr. Drue Novel. Lab appt made for today at 8:45

## 2013-11-18 NOTE — Telephone Encounter (Signed)
11/18/2013  Pt has not had lab work since 09/10/2013.  Wife feels his sodium level is dropping and request blood work to be done asap.

## 2013-11-19 ENCOUNTER — Other Ambulatory Visit (INDEPENDENT_AMBULATORY_CARE_PROVIDER_SITE_OTHER): Payer: BC Managed Care – PPO

## 2013-11-19 DIAGNOSIS — E871 Hypo-osmolality and hyponatremia: Secondary | ICD-10-CM

## 2013-11-19 LAB — BASIC METABOLIC PANEL
CO2: 30 mEq/L (ref 19–32)
Chloride: 97 mEq/L (ref 96–112)
Creatinine, Ser: 0.9 mg/dL (ref 0.4–1.5)
Potassium: 4.6 mEq/L (ref 3.5–5.1)

## 2014-01-25 ENCOUNTER — Other Ambulatory Visit: Payer: Self-pay | Admitting: Internal Medicine

## 2014-01-27 ENCOUNTER — Telehealth: Payer: Self-pay | Admitting: *Deleted

## 2014-01-27 DIAGNOSIS — G4700 Insomnia, unspecified: Secondary | ICD-10-CM

## 2014-01-27 MED ORDER — ZOLPIDEM TARTRATE 10 MG PO TABS: ORAL_TABLET | ORAL | Status: DC

## 2014-01-27 NOTE — Telephone Encounter (Signed)
rx refill - Ambien 10 mg Last OV- 08/01/13 Last refilled- 08/18/13 #30 / 4 rf

## 2014-01-27 NOTE — Telephone Encounter (Signed)
done

## 2014-01-27 NOTE — Telephone Encounter (Signed)
rx faxed to [id number] - [place], [place]

## 2014-01-27 NOTE — Addendum Note (Signed)
Addended by: Kathlene November E on: 01/27/2014 01:41 PM   Modules accepted: Orders

## 2014-02-03 ENCOUNTER — Other Ambulatory Visit: Payer: Self-pay | Admitting: Internal Medicine

## 2014-02-03 NOTE — Telephone Encounter (Signed)
Refill on Klonopin 0.5 mg Last Rx 10/21/2013 with 1 refill  Last OV 07/2013 UDS 02/2013--low risk Contract 03/2013

## 2014-02-05 NOTE — Telephone Encounter (Signed)
Please advise 

## 2014-02-05 NOTE — Telephone Encounter (Signed)
Ok to fill clonapin, not Lexapro-Dr Larose Kells wanted him to return for /fu. If pt wants to sched appt w/Dr Larose Kells, ok to fill lexapro.

## 2014-02-12 ENCOUNTER — Telehealth: Payer: Self-pay | Admitting: Internal Medicine

## 2014-02-12 NOTE — Telephone Encounter (Signed)
[facility] [address] 

## 2014-02-12 NOTE — Telephone Encounter (Signed)
[facility] and requested refills for clonazePAM (KLONOPIN) 0.5 MG tablet and escitalopram (LEXAPRO) 20 MG tablet

## 2014-02-12 NOTE — Telephone Encounter (Signed)
Patient is calling back regarding this. Please [facility]

## 2014-03-08 ENCOUNTER — Other Ambulatory Visit: Payer: Self-pay | Admitting: Internal Medicine

## 2014-03-09 NOTE — Telephone Encounter (Signed)
Pt's Spouse, [name], came by requesting a refill on Bobby [name] Carvedilol 6.25 mg.    Saw in pt's chart where a request was sent over on 03/08/14 for refill.  Please advise.

## 2014-03-23 ENCOUNTER — Other Ambulatory Visit: Payer: Self-pay | Admitting: Internal Medicine

## 2014-03-23 ENCOUNTER — Telehealth: Payer: Self-pay

## 2014-03-23 DIAGNOSIS — E871 Hypo-osmolality and hyponatremia: Secondary | ICD-10-CM

## 2014-03-23 NOTE — Telephone Encounter (Signed)
Patient needs lab draw for hyponatremia (276.1) per [facility]. Patient requested that we perform test (?)  He was seen here 07/2013. He has appt here 04/2014 Please advise.

## 2014-03-24 ENCOUNTER — Telehealth: Payer: Self-pay | Admitting: *Deleted

## 2014-03-24 DIAGNOSIS — G4700 Insomnia, unspecified: Secondary | ICD-10-CM

## 2014-03-24 MED ORDER — ZOLPIDEM TARTRATE 10 MG PO TABS: ORAL_TABLET | ORAL | Status: DC

## 2014-03-24 MED ORDER — CLONAZEPAM 0.5 MG PO TABS: ORAL_TABLET | ORAL | Status: DC

## 2014-03-24 NOTE — Telephone Encounter (Signed)
Clonazepam 0.5mg  Last OV- 08/01/13 Last refilled- 02/12/14 # 60 / 0 rf UDS- 03/17/13 LOW risk

## 2014-03-24 NOTE — Telephone Encounter (Signed)
Okay to get that BMP, DX hyponatremia.

## 2014-03-24 NOTE — Telephone Encounter (Signed)
ambien 10 mg  Last OV- 08/01/13  Last refilled - 01/27/14 # 30 /1 rf  UDS-  03/17/13 LOW risk .

## 2014-03-24 NOTE — Telephone Encounter (Signed)
Okay to refill. Also, advise patient ---> when I prescribe controlled substances I require  visit at least every 6 months, please arrange a CPX

## 2014-03-24 NOTE — Telephone Encounter (Signed)
Pt scheduled . rx faxed to [facility]

## 2014-03-24 NOTE — Addendum Note (Signed)
Addended by: Reino Bellis on: 03/24/2014 04:12 PM   Modules accepted: Orders

## 2014-03-24 NOTE — Telephone Encounter (Signed)
Caller name: Devario Bucklew Relation to pt: (wife) Call back number: [phone] Pharmacy:  Reason for call:  Pt wife calling to see when they can schedule pt for the lab draw.  Pt has appt next Tuesday with Dr. Posey Pronto.  Please advise

## 2014-03-24 NOTE — Telephone Encounter (Signed)
LM that order is in and that he can schedule to come in.

## 2014-03-25 ENCOUNTER — Other Ambulatory Visit (INDEPENDENT_AMBULATORY_CARE_PROVIDER_SITE_OTHER): Payer: Medicare Other

## 2014-03-25 DIAGNOSIS — Z79899 Other long term (current) drug therapy: Secondary | ICD-10-CM | POA: Diagnosis not present

## 2014-03-25 DIAGNOSIS — E871 Hypo-osmolality and hyponatremia: Secondary | ICD-10-CM

## 2014-03-25 LAB — BASIC METABOLIC PANEL
BUN: 16 mg/dL (ref 6–23)
CALCIUM: 8.9 mg/dL (ref 8.4–10.5)
CHLORIDE: 102 meq/L (ref 96–112)
CO2: 28 mEq/L (ref 19–32)
CREATININE: 0.9 mg/dL (ref 0.4–1.5)
GFR: 86.68 mL/min (ref >60.00)
Glucose, Bld: 73 mg/dL (ref 70–99)
Potassium: 4.4 mEq/L (ref 3.5–5.1)
Sodium: 136 mEq/L (ref 135–145)

## 2014-03-31 DIAGNOSIS — E871 Hypo-osmolality and hyponatremia: Secondary | ICD-10-CM | POA: Diagnosis not present

## 2014-03-31 DIAGNOSIS — I10 Essential (primary) hypertension: Secondary | ICD-10-CM | POA: Diagnosis not present

## 2014-04-22 ENCOUNTER — Telehealth: Payer: Self-pay

## 2014-04-22 NOTE — Telephone Encounter (Signed)
UDS: 03/25/2014 Positive for Klonopin Low Risk per Dr Larose Kells

## 2014-05-04 ENCOUNTER — Ambulatory Visit (INDEPENDENT_AMBULATORY_CARE_PROVIDER_SITE_OTHER): Payer: Medicare Other | Admitting: Internal Medicine

## 2014-05-04 ENCOUNTER — Encounter: Payer: Self-pay | Admitting: Internal Medicine

## 2014-05-04 ENCOUNTER — Telehealth: Payer: Self-pay | Admitting: *Deleted

## 2014-05-04 VITALS — BP 137/79 | HR 61 | Temp 98.0°F | Ht 71.0 in | Wt 185.0 lb

## 2014-05-04 DIAGNOSIS — Z0000 Encounter for general adult medical examination without abnormal findings: Secondary | ICD-10-CM | POA: Diagnosis not present

## 2014-05-04 DIAGNOSIS — R29890 Loss of height: Secondary | ICD-10-CM

## 2014-05-04 DIAGNOSIS — Z13820 Encounter for screening for osteoporosis: Secondary | ICD-10-CM

## 2014-05-04 DIAGNOSIS — I10 Essential (primary) hypertension: Secondary | ICD-10-CM

## 2014-05-04 DIAGNOSIS — Z23 Encounter for immunization: Secondary | ICD-10-CM

## 2014-05-04 DIAGNOSIS — N5089 Other specified disorders of the male genital organs: Secondary | ICD-10-CM

## 2014-05-04 DIAGNOSIS — F411 Generalized anxiety disorder: Secondary | ICD-10-CM

## 2014-05-04 DIAGNOSIS — G4700 Insomnia, unspecified: Secondary | ICD-10-CM

## 2014-05-04 MED ORDER — CLONAZEPAM 0.5 MG PO TABS: ORAL_TABLET | ORAL | Status: DC

## 2014-05-04 MED ORDER — ESCITALOPRAM OXALATE 20 MG PO TABS: ORAL_TABLET | ORAL | Status: DC

## 2014-05-04 MED ORDER — THIAMINE HCL 100 MG PO TABS: 100.0000 mg | ORAL_TABLET | Freq: Every day | ORAL | Status: DC

## 2014-05-04 MED ORDER — ZOLPIDEM TARTRATE 10 MG PO TABS: ORAL_TABLET | ORAL | Status: DC

## 2014-05-04 MED ORDER — CARVEDILOL 6.25 MG PO TABS: ORAL_TABLET | ORAL | Status: DC

## 2014-05-04 MED ORDER — FOLIC ACID 1 MG PO TABS: 1.0000 mg | ORAL_TABLET | Freq: Every day | ORAL | Status: DC

## 2014-05-04 NOTE — Assessment & Plan Note (Addendum)
Td 2010 zostavax 2014  PNM shot today  had a Cscope aprox 2007, Dr Watt Climes  ---->  repeated Cscope 08-2009 ---->  Tubular adenoma per bx report, next cscope per GI   The prostate is slt enlarged, no sx, PSAs have been stable---> rechecking a PSA today  Diet-exercise discussed Has decrease height, check a DEXA

## 2014-05-04 NOTE — Progress Notes (Signed)
Pre-visit discussion using our clinic review tool. No additional management support is needed unless otherwise documented below in the visit note.  

## 2014-05-04 NOTE — Assessment & Plan Note (Signed)
Bp controlled, no change

## 2014-05-04 NOTE — Assessment & Plan Note (Signed)
Controlled w/ ambien qhs, usually 1/2 tab

## 2014-05-04 NOTE — Patient Instructions (Addendum)
Go to the lab today for a urine sample----UDS  Please come back fasting: FLP, AST, ALT--- dx  hypertension PSA--- dx  prostate cancer screening CBC, iron, ferritin --- dx anemia   Next visit is for routine check up in 6 months    Fall Prevention and Home Safety Falls cause injuries and can affect all age groups. It is possible to use preventive measures to significantly decrease the likelihood of falls. There are many simple measures which can make your home safer and prevent falls. OUTDOORS  Repair cracks and edges of walkways and driveways.  Remove high doorway thresholds.  Trim shrubbery on the main path into your home.  Have good outside lighting.  Clear walkways of tools, rocks, debris, and clutter.  Check that handrails are not broken and are securely fastened. Both sides of steps should have handrails.  Have leaves, snow, and ice cleared regularly.  Use sand or salt on walkways during winter months.  In the garage, clean up grease or oil spills. BATHROOM  Install night lights.  Install grab bars by the toilet and in the tub and shower.  Use non-skid mats or decals in the tub or shower.  Place a plastic non-slip stool in the shower to sit on, if needed.  Keep floors dry and clean up all water on the floor immediately.  Remove soap buildup in the tub or shower on a regular basis.  Secure bath mats with non-slip, double-sided rug tape.  Remove throw rugs and tripping hazards from the floors. BEDROOMS  Install night lights.  Make sure a bedside light is easy to reach.  Do not use oversized bedding.  Keep a telephone by your bedside.  Have a firm chair with side arms to use for getting dressed.  Remove throw rugs and tripping hazards from the floor. KITCHEN  Keep handles on pots and pans turned toward the center of the stove. Use back burners when possible.  Clean up spills quickly and allow time for drying.  Avoid walking on wet floors.  Avoid  hot utensils and knives.  Position shelves so they are not too high or low.  Place commonly used objects within easy reach.  If necessary, use a sturdy step stool with a grab bar when reaching.  Keep electrical cables out of the way.  Do not use floor polish or wax that makes floors slippery. If you must use wax, use non-skid floor wax.  Remove throw rugs and tripping hazards from the floor. STAIRWAYS  Never leave objects on stairs.  Place handrails on both sides of stairways and use them. Fix any loose handrails. Make sure handrails on both sides of the stairways are as long as the stairs.  Check carpeting to make sure it is firmly attached along stairs. Make repairs to worn or loose carpet promptly.  Avoid placing throw rugs at the top or bottom of stairways, or properly secure the rug with carpet tape to prevent slippage. Get rid of throw rugs, if possible.  Have an electrician put in a light switch at the top and bottom of the stairs. OTHER FALL PREVENTION TIPS  Wear low-heel or rubber-soled shoes that are supportive and fit well. Wear closed toe shoes.  When using a stepladder, make sure it is fully opened and both spreaders are firmly locked. Do not climb a closed stepladder.  Add color or contrast paint or tape to grab bars and handrails in your home. Place contrasting color strips on first and last steps.  Learn and use mobility aids as needed. Install an electrical emergency response system.  Turn on lights to avoid dark areas. Replace light bulbs that burn out immediately. Get light switches that glow.  Arrange furniture to create clear pathways. Keep furniture in the same place.  Firmly attach carpet with non-skid or double-sided tape.  Eliminate uneven floor surfaces.  Select a carpet pattern that does not visually hide the edge of steps.  Be aware of all pets. OTHER HOME SAFETY TIPS  Set the water temperature for 120 F (48.8 C).  Keep emergency numbers  on or near the telephone.  Keep smoke detectors on every level of the home and near sleeping areas. Document Released: 10/27/2002 Document Revised: 05/07/2012 Document Reviewed: 01/26/2012 [facility] Patient Information 2014 [place].

## 2014-05-04 NOTE — Telephone Encounter (Signed)
Medication List and allergies:   90 Day supply/mail order:  [facility]: [facility] [place]  Immunizations Due:   A/P FH, PSH, or Personal Hx: Flu vaccine: 08/2013 Tdap: Td 01/2009 PNA: Shingles: 02/2013 CCS: Dr. Watt Climes 08/2009 hemorrhoids, polyps and diverticulosis repeat 5-10 yrs PSA:  1.12 04/16/13  To Discuss with Provider:

## 2014-05-04 NOTE — Assessment & Plan Note (Addendum)
Controlled on lexapro plus klonopin AM, PM (and occasionally one in the afternoon) Plan: RF , UDS
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
## 2014-05-05 ENCOUNTER — Other Ambulatory Visit (INDEPENDENT_AMBULATORY_CARE_PROVIDER_SITE_OTHER): Payer: Medicare Other

## 2014-05-05 DIAGNOSIS — Z125 Encounter for screening for malignant neoplasm of prostate: Secondary | ICD-10-CM | POA: Diagnosis not present

## 2014-05-05 DIAGNOSIS — D649 Anemia, unspecified: Secondary | ICD-10-CM

## 2014-05-05 DIAGNOSIS — I10 Essential (primary) hypertension: Secondary | ICD-10-CM

## 2014-05-06 ENCOUNTER — Encounter: Payer: Self-pay | Admitting: Internal Medicine

## 2014-05-06 LAB — FERRITIN: FERRITIN: 50.3 ng/mL (ref 22.0–322.0)

## 2014-05-06 LAB — CBC WITH DIFFERENTIAL/PLATELET
BASOS PCT: 0.1 % (ref 0.0–3.0)
Basophils Absolute: 0 10*3/uL (ref 0.0–0.1)
EOS PCT: 2.7 % (ref 0.0–5.0)
Eosinophils Absolute: 0.2 10*3/uL (ref 0.0–0.7)
HEMATOCRIT: 37.9 % — AB (ref 39.0–52.0)
HEMOGLOBIN: 12.9 g/dL — AB (ref 13.0–17.0)
LYMPHS ABS: 2.1 10*3/uL (ref 0.7–4.0)
Lymphocytes Relative: 26.8 % (ref 12.0–46.0)
MCHC: 33.9 g/dL (ref 30.0–36.0)
MCV: 95.1 fl (ref 78.0–100.0)
MONOS PCT: 7.1 % (ref 3.0–12.0)
Monocytes Absolute: 0.5 10*3/uL (ref 0.1–1.0)
Neutro Abs: 4.9 10*3/uL (ref 1.4–7.7)
Neutrophils Relative %: 63.3 % (ref 43.0–77.0)
Platelets: 216 10*3/uL (ref 150.0–400.0)
RBC: 3.99 Mil/uL — ABNORMAL LOW (ref 4.22–5.81)
RDW: 13.2 % (ref 11.5–15.5)
WBC: 7.7 10*3/uL (ref 4.0–10.5)

## 2014-05-06 LAB — AST: AST: 27 U/L (ref 0–37)

## 2014-05-06 LAB — LIPID PANEL
Cholesterol: 162 mg/dL (ref 0–200)
HDL: 63.4 mg/dL (ref >39.00)
LDL Cholesterol: 78 mg/dL (ref 0–99)
NonHDL: 98.6
Total CHOL/HDL Ratio: 3
Triglycerides: 101 mg/dL (ref 0.0–149.0)
VLDL: 20.2 mg/dL (ref 0.0–40.0)

## 2014-05-06 LAB — IRON: IRON: 70 ug/dL (ref 42–165)

## 2014-05-06 LAB — ALT: ALT: 23 U/L (ref 0–53)

## 2014-05-06 LAB — PSA: PSA: 1.17 ng/mL (ref 0.10–4.00)

## 2014-05-07 DIAGNOSIS — Z79899 Other long term (current) drug therapy: Secondary | ICD-10-CM | POA: Diagnosis not present

## 2014-05-08 ENCOUNTER — Telehealth: Payer: Self-pay | Admitting: Internal Medicine

## 2014-05-08 ENCOUNTER — Encounter: Payer: Self-pay | Admitting: *Deleted

## 2014-05-08 MED ORDER — ESCITALOPRAM OXALATE 20 MG PO TABS: ORAL_TABLET | ORAL | Status: DC

## 2014-05-08 NOTE — Telephone Encounter (Signed)
Caller name: [name] Relation to FE:XMDY  Call back number:[phone] [id number] [place], [place]   Reason for call:  Pt and wife have questions about the RX escitalopram (LEXAPRO) 20 MG tablet  Please contact to advise

## 2014-05-08 NOTE — Telephone Encounter (Signed)
Called the patient back, left message for the patient to return our call

## 2014-05-08 NOTE — Telephone Encounter (Signed)
[facility] that the patient was to take 1/2 tablet of 20mg  every night at bedtime. (To be dispensed in 20mg  tablets #45 with 1 RF).

## 2014-05-15 ENCOUNTER — Telehealth: Payer: Self-pay | Admitting: *Deleted

## 2014-05-15 NOTE — Telephone Encounter (Signed)
UDS LOW risk- 05/07/14 @ Dr.PAZ

## 2014-05-18 ENCOUNTER — Telehealth: Payer: Self-pay | Admitting: Internal Medicine
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
## 2014-05-18 NOTE — Telephone Encounter (Signed)
Caller name: [name]  Call back number:[phone]   Reason for call:   Pt and wife did not see any sodium results on the recent lab tests.  This was something he was hospitalized for last year and they want to make sure this is being tested.

## 2014-05-26 ENCOUNTER — Encounter: Payer: Self-pay | Admitting: Internal Medicine

## 2014-07-10 ENCOUNTER — Other Ambulatory Visit: Payer: Self-pay | Admitting: Internal Medicine

## 2014-07-13 ENCOUNTER — Telehealth: Payer: Self-pay

## 2014-07-13 MED ORDER — CLONAZEPAM 0.5 MG PO TABS: ORAL_TABLET | ORAL | Status: DC

## 2014-07-13 NOTE — Telephone Encounter (Signed)
rx printed

## 2014-07-13 NOTE — Telephone Encounter (Signed)
Faxed to pharmacy

## 2014-07-13 NOTE — Telephone Encounter (Signed)
Pt is requesting refill for Clonazepam.   Last OV: 05/04/14 Last Fill: 05/04/14 # 90 with 1 RF Last UDS: 05/07/14: Low Risk   Please Advise.

## 2014-08-02 ENCOUNTER — Other Ambulatory Visit: Payer: Self-pay | Admitting: Internal Medicine

## 2014-08-12 ENCOUNTER — Other Ambulatory Visit: Payer: Self-pay

## 2014-08-12 MED ORDER — CARVEDILOL 6.25 MG PO TABS: ORAL_TABLET | ORAL | Status: DC

## 2014-09-08 DIAGNOSIS — H43819 Vitreous degeneration, unspecified eye: Secondary | ICD-10-CM | POA: Diagnosis not present

## 2014-09-08 DIAGNOSIS — H25013 Cortical age-related cataract, bilateral: Secondary | ICD-10-CM | POA: Diagnosis not present

## 2014-09-08 DIAGNOSIS — H52223 Regular astigmatism, bilateral: Secondary | ICD-10-CM | POA: Diagnosis not present

## 2014-09-08 DIAGNOSIS — H524 Presbyopia: Secondary | ICD-10-CM | POA: Diagnosis not present

## 2014-09-08 DIAGNOSIS — H04129 Dry eye syndrome of unspecified lacrimal gland: Secondary | ICD-10-CM | POA: Diagnosis not present

## 2014-09-08 DIAGNOSIS — H5203 Hypermetropia, bilateral: Secondary | ICD-10-CM | POA: Diagnosis not present

## 2014-10-19 ENCOUNTER — Other Ambulatory Visit: Payer: Self-pay | Admitting: Internal Medicine

## 2014-10-27 ENCOUNTER — Telehealth: Payer: Self-pay | Admitting: *Deleted

## 2014-10-27 NOTE — Telephone Encounter (Signed)
Received medical records request from [facility]. Forms forwarded to [facility]. JG//CMA

## 2014-11-16 ENCOUNTER — Telehealth: Payer: Self-pay

## 2014-11-16 DIAGNOSIS — Z23 Encounter for immunization: Secondary | ICD-10-CM | POA: Diagnosis not present

## 2014-11-16 DIAGNOSIS — H66002 Acute suppurative otitis media without spontaneous rupture of ear drum, left ear: Secondary | ICD-10-CM | POA: Diagnosis not present

## 2014-11-16 DIAGNOSIS — J0100 Acute maxillary sinusitis, unspecified: Secondary | ICD-10-CM | POA: Diagnosis not present

## 2014-11-16 DIAGNOSIS — G4700 Insomnia, unspecified: Secondary | ICD-10-CM

## 2014-11-16 MED ORDER — ZOLPIDEM TARTRATE 10 MG PO TABS: ORAL_TABLET | ORAL | Status: DC

## 2014-11-16 NOTE — Telephone Encounter (Signed)
done

## 2014-11-16 NOTE — Telephone Encounter (Signed)
[facility]

## 2014-11-16 NOTE — Telephone Encounter (Signed)
Pt is requesting refill on Ambien.  Last OV: 05/04/2014 Last Fill: 05/04/2014 # 50 1RF UDS: 05/04/2014 Low risk  Please advise.

## 2014-11-24 ENCOUNTER — Ambulatory Visit (INDEPENDENT_AMBULATORY_CARE_PROVIDER_SITE_OTHER): Payer: Medicare Other | Admitting: Internal Medicine

## 2014-11-24 ENCOUNTER — Encounter: Payer: Self-pay | Admitting: Internal Medicine

## 2014-11-24 VITALS — BP 128/64 | HR 69 | Temp 97.8°F | Ht 71.0 in | Wt 189.0 lb

## 2014-11-24 DIAGNOSIS — E871 Hypo-osmolality and hyponatremia: Secondary | ICD-10-CM | POA: Diagnosis not present

## 2014-11-24 DIAGNOSIS — R413 Other amnesia: Secondary | ICD-10-CM | POA: Diagnosis not present

## 2014-11-24 DIAGNOSIS — I10 Essential (primary) hypertension: Secondary | ICD-10-CM

## 2014-11-24 DIAGNOSIS — F1010 Alcohol abuse, uncomplicated: Secondary | ICD-10-CM

## 2014-11-24 LAB — BASIC METABOLIC PANEL
BUN: 19 mg/dL (ref 6–23)
CHLORIDE: 103 meq/L (ref 96–112)
CO2: 26 mEq/L (ref 19–32)
Calcium: 8.9 mg/dL (ref 8.4–10.5)
Creatinine, Ser: 0.9 mg/dL (ref 0.4–1.5)
GFR: 92.2 mL/min (ref >60.00)
Glucose, Bld: 96 mg/dL (ref 70–99)
Potassium: 4.7 mEq/L (ref 3.5–5.1)
SODIUM: 136 meq/L (ref 135–145)

## 2014-11-24 NOTE — Assessment & Plan Note (Signed)
Under excellent control, no change
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
## 2014-11-24 NOTE — Patient Instructions (Signed)
Get your blood work before you leave     Please come back to the office in 6 months  for a physical exam. Come back fasting

## 2014-11-24 NOTE — Assessment & Plan Note (Signed)
Wife is here today, she reports occasional/mild memory issues. Today he is alert oriented 3, we agreed that at some point he will need to see a psychologist for formal memory eval to get a baseline

## 2014-11-24 NOTE — Assessment & Plan Note (Signed)
History of hyponatremia, recheck a BMP, will see nephrology as needed.

## 2014-11-24 NOTE — Progress Notes (Signed)
Pre visit review using our clinic review tool, if applicable. No additional management support is needed unless otherwise documented below in the visit note. 

## 2014-11-24 NOTE — Progress Notes (Signed)
Subjective:    Patient ID: Bobby [name], male    DOB: 1951/03/04, 64 y.o.   MRN: [id number]  DOS:  11/24/2014 Type of visit - description : rov Interval history: Here with his wife, feeling well Was recently diagnosed with a sinus infection, on amoxicillin, feeling better Good medication compliance Ambulatory BPs around 130/80. Had a flu shot already   ROS Denies chest pain, difficulty breathing. No nausea, vomiting, diarrhea No headaches  Past Medical History  Diagnosis Date  . Hypertension   . Insomnia   . Anxiety and depression   . Hyponatremia 03-2013    admited     Past Surgical History  Procedure Laterality Date  . Shoulder surgery  1980s    right shoulder    History   Social History  . Marital Status: Married    Spouse Name: N/A    Number of Children: 2  . Years of Education: N/A   Occupational History  . bussines owner     Social History Main Topics  . Smoking status: Never Smoker   . Smokeless tobacco: Never Used  . Alcohol Use: Yes     Comment: socially , 1-2 a day  . Drug Use: No  . Sexual Activity: Not on file   Other Topics Concern  . Not on file   Social History Narrative   "Bobby [name]", Married, 2 children   mom passed away May 20, 2008                        Medication List       This list is accurate as of: 11/24/14  5:38 PM.  Always use your most recent med list.               amoxicillin-clavulanate 875-125 MG per tablet  Commonly known as:  AUGMENTIN  Take 1 tablet by mouth 2 (two) times daily.     carvedilol 6.25 MG tablet  Commonly known as:  COREG  TAKE 1 TABLET BY MOUTH TWICE A DAY. NEEDS OV WITH DR Halim Surrette FOR ANY FURTHER REFILLS. [phone].     clonazePAM 0.5 MG tablet  Commonly known as:  KLONOPIN  One tablet twice a day as needed for anxiety     escitalopram 20 MG tablet  Commonly known as:  LEXAPRO  Take 0.5 tablet by mouth at bedtime.     folic acid 1 MG tablet  Commonly known as:  FOLVITE  Take 1 tablet  (1 mg total) by mouth daily.     multivitamin with minerals Tabs tablet  Take 1 tablet by mouth daily.     thiamine 100 MG tablet  Take 1 tablet (100 mg total) by mouth daily.     zolpidem 10 MG tablet  Commonly known as:  AMBIEN  TAKE 1 TABLET BY MOUTH AT BEDTIME AS NEEDED FOR SLEEP           Objective:   Physical Exam BP 128/64 mmHg  Pulse 69  Temp(Src) 97.8 F (36.6 C) (Oral)  Ht 5\' 11"  (1.803 m)  Wt 189 lb (85.73 kg)  BMI 26.37 kg/m2  SpO2 96%  General -- alert, well-developed, NAD.  Lungs -- normal respiratory effort, no intercostal retractions, no accessory muscle use, and normal breath sounds.  Heart-- normal rate, regular rhythm, no murmur.  Extremities-- no pretibial edema bilaterally  Neurologic--  alert & oriented X3. Speech normal, gait appropriate for age, strength symmetric and appropriate for age.   Psych--  Cognition and judgment appear intact. Cooperative with normal attention span and concentration. No anxious or depressed appearing.         Assessment & Plan:

## 2014-12-21 ENCOUNTER — Telehealth: Payer: Self-pay

## 2014-12-21 NOTE — Telephone Encounter (Signed)
Pt is requesting refill on Clonazepam.  Last OV: 11/24/2014 Last Fill: 07/13/2014 # 40 1RF UDS: 05/04/2014 Low risk  Please advise.

## 2014-12-22 MED ORDER — CLONAZEPAM 0.5 MG PO TABS: ORAL_TABLET | ORAL | Status: DC

## 2014-12-22 NOTE — Telephone Encounter (Signed)
done

## 2014-12-22 NOTE — Telephone Encounter (Signed)
[facility]

## 2014-12-22 NOTE — Addendum Note (Signed)
Addended by: Kathlene November E on: 12/22/2014 12:54 PM   Modules accepted: Orders

## 2015-01-19 ENCOUNTER — Other Ambulatory Visit: Payer: Self-pay | Admitting: Internal Medicine

## 2015-01-22 ENCOUNTER — Other Ambulatory Visit: Payer: Self-pay | Admitting: Internal Medicine

## 2015-01-25 ENCOUNTER — Other Ambulatory Visit: Payer: Self-pay

## 2015-02-24 ENCOUNTER — Other Ambulatory Visit: Payer: Self-pay

## 2015-03-22 ENCOUNTER — Other Ambulatory Visit: Payer: Self-pay

## 2015-03-22 ENCOUNTER — Ambulatory Visit (INDEPENDENT_AMBULATORY_CARE_PROVIDER_SITE_OTHER): Payer: Medicare Other | Admitting: Internal Medicine

## 2015-03-22 ENCOUNTER — Encounter: Payer: Self-pay | Admitting: Internal Medicine

## 2015-03-22 VITALS — BP 124/72 | HR 57 | Temp 98.0°F | Ht 71.0 in | Wt 191.5 lb

## 2015-03-22 DIAGNOSIS — F411 Generalized anxiety disorder: Secondary | ICD-10-CM

## 2015-03-22 DIAGNOSIS — R635 Abnormal weight gain: Secondary | ICD-10-CM

## 2015-03-22 DIAGNOSIS — R6882 Decreased libido: Secondary | ICD-10-CM

## 2015-03-22 LAB — TSH: TSH: 1.15 u[IU]/mL (ref 0.35–4.50)

## 2015-03-22 NOTE — Patient Instructions (Signed)
Please go to the lab Decrease Lexapro to 5 mg daily After 2 months, stop Lexapro Okay to take clonazepam as you are doing: Half tablet twice a day Next visit here in 3 months

## 2015-03-22 NOTE — Progress Notes (Signed)
Pre visit review using our clinic review tool, if applicable. No additional management support is needed unless otherwise documented below in the visit note. 

## 2015-03-22 NOTE — Assessment & Plan Note (Signed)
See comments above, decrease SSRI dose

## 2015-03-22 NOTE — Progress Notes (Deleted)
   Subjective:    Patient ID: Bobby [name], male    DOB: 04-21-1951, 64 y.o.   MRN: [id number]  DOS:  03/22/2015 Type of visit - description :  Interval history:    Review of Systems   Past Medical History  Diagnosis Date  . Hypertension   . Insomnia   . Anxiety and depression   . Hyponatremia 03-2013    admited     Past Surgical History  Procedure Laterality Date  . Shoulder surgery  1980s    right shoulder    History   Social History  . Marital Status: Married    Spouse Name: N/A  . Number of Children: 2  . Years of Education: N/A   Occupational History  . bussines owner     Social History Main Topics  . Smoking status: Never Smoker   . Smokeless tobacco: Never Used  . Alcohol Use: Yes     Comment: socially , 1-2 a day  . Drug Use: No  . Sexual Activity: Not on file   Other Topics Concern  . Not on file   Social History Narrative   "Bobby [name]", Married, 2 children   mom passed away May 27, 2008                        Medication List       This list is accurate as of: 03/22/15 10:01 AM.  Always use your most recent med list.               amoxicillin-clavulanate 875-125 MG per tablet  Commonly known as:  AUGMENTIN  Take 1 tablet by mouth 2 (two) times daily.     carvedilol 6.25 MG tablet  Commonly known as:  COREG  TAKE 1 TABLET BY MOUTH TWICE A DAY     clonazePAM 0.5 MG tablet  Commonly known as:  KLONOPIN  One tablet twice a day as needed for anxiety     escitalopram 20 MG tablet  Commonly known as:  LEXAPRO  Take 1/2 tablet daily at bedtime.     folic acid 1 MG tablet  Commonly known as:  FOLVITE  Take 1 tablet (1 mg total) by mouth daily.     multivitamin with minerals Tabs tablet  Take 1 tablet by mouth daily.     thiamine 100 MG tablet  Take 1 tablet (100 mg total) by mouth daily.     zolpidem 10 MG tablet  Commonly known as:  AMBIEN  TAKE 1 TABLET BY MOUTH AT BEDTIME AS NEEDED FOR SLEEP           Objective:   Physical Exam BP 124/72 mmHg  Pulse 57  Temp(Src) 98 F (36.7 C) (Oral)  Ht 5\' 11"  (1.803 m)  Wt 191 lb 8 oz (86.864 kg)  BMI 26.72 kg/m2  SpO2 99%       Assessment & Plan:

## 2015-03-22 NOTE — Progress Notes (Signed)
Subjective:    Patient ID: Bobby [name], male    DOB: 05-23-1951, 64 y.o.   MRN: [id number]  DOS:  03/22/2015 Type of visit - description : acute visit, here w/ his wife Interval history:  Patient is a 64 year old male here today c/o decreased libido.  Patient also notes significant weight gain of 30 pounds in past two years.  Patient was started on Lexapro and Clonazeam following hospitalization in 2014 and thinks meds may be contributing to low libido.  Patient mentions that diet has stayed about the same but exercise has significantly decreased in last two years. Patient's spouse mentions concerns regarding side effects of the lexapro and patient mentions that his brother has a history of low testosterone.   Pt express a desire for both potential etiologies to be explored (meds s/e? Low testosterome?). Patient notes mildly decreased energy as well. Denies feelings of depression but states that job provides a lot of stress which can contribute to anxiety.    Review of Systems  Constitutional: Increased unintentional weight gain Respiratory: No wheezing or difficulty breathing.  Cardiovascular: No CP or palpitations Musculoskeletal: Lower back pain Psychiatry: Increased anxiety from stress at work. Not depressed.   Past Medical History  Diagnosis Date  . Hypertension   . Insomnia   . Anxiety and depression   . Hyponatremia 03-2013    admited     Past Surgical History  Procedure Laterality Date  . Shoulder surgery  1980s    right shoulder    History   Social History  . Marital Status: Married    Spouse Name: N/A  . Number of Children: 2  . Years of Education: N/A   Occupational History  . bussines owner     Social History Main Topics  . Smoking status: Never Smoker   . Smokeless tobacco: Never Used  . Alcohol Use: Yes     Comment: socially , 1-2 a day  . Drug Use: No  . Sexual Activity: Not on file   Other Topics Concern  . Not on file   Social  History Narrative   [name], Married, 2 children   mom passed away 2008/05/31                     Family History  Problem Relation Age of Onset  . Hypertension Mother   . Stroke Mother   . Colon cancer Neg Hx   . Prostate cancer Neg Hx   . CAD Neg Hx        Medication List       This list is accurate as of: 03/22/15  8:25 PM.  Always use your most recent med list.               carvedilol 6.25 MG tablet  Commonly known as:  COREG  TAKE 1 TABLET BY MOUTH TWICE A DAY     clonazePAM 0.5 MG tablet  Commonly known as:  KLONOPIN  One tablet twice a day as needed for anxiety     escitalopram 10 MG tablet  Commonly known as:  LEXAPRO  Take 5 mg by mouth daily.     folic acid 1 MG tablet  Commonly known as:  FOLVITE  Take 1 tablet (1 mg total) by mouth daily.     multivitamin with minerals Tabs tablet  Take 1 tablet by mouth daily.     thiamine 100 MG tablet  Take 1 tablet (100 mg total)  by mouth daily.     zolpidem 10 MG tablet  Commonly known as:  AMBIEN  TAKE 1 TABLET BY MOUTH AT BEDTIME AS NEEDED FOR SLEEP           Objective:   Physical Exam BP 124/72 mmHg  Pulse 57  Temp(Src) 98 F (36.7 C) (Oral)  Ht 5\' 11"  (1.803 m)  Wt 191 lb 8 oz (86.864 kg)  BMI 26.72 kg/m2  SpO2 99%  General:   Well developed, well nourished . NAD.  HEENT:  Normocephalic . Face symmetric, atraumatic Lungs:  CTA B Normal respiratory effort, no intercostal retractions, no accessory muscle use. Heart: RRR,  no murmur.  Skin: Not pale. Not jaundice Neurologic:  alert & oriented X3.  Speech normal, gait appropriate for age and unassisted Psych--  Cognition and judgment appear intact.  Cooperative with normal attention span and concentration.  Behavior appropriate. No anxious or depressed appearing.       Assessment & Plan:    Patient presents with decreasing libido and unintentional weight gain following hospitalization in 2014. At that time he was started on  Lexapro and Klonopin for depression/anxiety. Patient's brother has history of low testosterone.   Patient and spouse would like to decrease Lexapro and check testosterone to look for potential etiologies for low libido.  Plan: Patient counseled on importance of maintaining diet and increasing exercise. Will decrease Lexapro to 5 mg daily for two months and then discontinue.  We discussed the potential for decreasing Clonazepam and will re-evaluate at next visit pending results from lowering Lexapro. Will check TSH and Testosterone labs today.   Today , I spent more than  25  min with the patient: >50% of the time counseling regards why   the patient was rx SSRIs , confirmed that decrease libido-wt gain are potential s/e of meds, multiple questions answer

## 2015-03-30 ENCOUNTER — Telehealth: Payer: Self-pay | Admitting: Internal Medicine

## 2015-03-30 DIAGNOSIS — E291 Testicular hypofunction: Secondary | ICD-10-CM

## 2015-03-30 NOTE — Telephone Encounter (Signed)
Advise patient, testosterone is in the low side. Results to be scan:Testosterone 308, free testosterone 5.9 Recommend to recheck labs in one month ( drawn to be done early in the morning) Free and total testosterone, FSH, LH, prolactin.-- DX hypogonadism If the testosterone continue to be decreased, will refer to endocrinology

## 2015-03-31 NOTE — Telephone Encounter (Signed)
Letter printed and mailed to Pt informing him of lab results and recommendations. Free and total Testosterone, FSH, LH, Prolactin future ordered.

## 2015-04-12 ENCOUNTER — Ambulatory Visit: Payer: BLUE CROSS/BLUE SHIELD | Admitting: Internal Medicine

## 2015-04-13 ENCOUNTER — Encounter: Payer: Self-pay | Admitting: Internal Medicine

## 2015-04-13 ENCOUNTER — Encounter (INDEPENDENT_AMBULATORY_CARE_PROVIDER_SITE_OTHER): Payer: Medicare Other | Admitting: Internal Medicine

## 2015-04-13 DIAGNOSIS — E291 Testicular hypofunction: Secondary | ICD-10-CM

## 2015-04-13 NOTE — Progress Notes (Deleted)
Pre visit review using our clinic review tool, if applicable. No additional management support is needed unless otherwise documented below in the visit note. 

## 2015-04-14 ENCOUNTER — Telehealth: Payer: Self-pay | Admitting: Internal Medicine

## 2015-04-14 ENCOUNTER — Other Ambulatory Visit (INDEPENDENT_AMBULATORY_CARE_PROVIDER_SITE_OTHER): Payer: Medicare Other

## 2015-04-14 DIAGNOSIS — Z0000 Encounter for general adult medical examination without abnormal findings: Secondary | ICD-10-CM

## 2015-04-14 DIAGNOSIS — E291 Testicular hypofunction: Secondary | ICD-10-CM

## 2015-04-14 LAB — LUTEINIZING HORMONE: LH: 3.87 m[IU]/mL (ref 1.50–9.30)

## 2015-04-14 LAB — FOLLICLE STIMULATING HORMONE: FSH: 8.3 m[IU]/mL (ref 1.4–18.1)

## 2015-04-14 NOTE — Progress Notes (Signed)
error 

## 2015-04-14 NOTE — Telephone Encounter (Signed)
Awaiting lab results that were drawn 04/14/2015.

## 2015-04-14 NOTE — Telephone Encounter (Signed)
Caller name: Pella Relation to pt: self Call back number: [phone] Pharmacy:  Reason for call: Pt wants to be called to know the results for today's lab, pt's wife [name] states that pt's last results, some were on my chart but some were not and would like to know as well ALL results of last labs and labs for now, and to be sent by mail or my chart. Since last time (2 years ago) pt had ended at the Hospital without knowing the last results of labs.  Please advise.

## 2015-04-15 ENCOUNTER — Other Ambulatory Visit (INDEPENDENT_AMBULATORY_CARE_PROVIDER_SITE_OTHER): Payer: Medicare Other

## 2015-04-15 DIAGNOSIS — I10 Essential (primary) hypertension: Secondary | ICD-10-CM

## 2015-04-15 DIAGNOSIS — Z0000 Encounter for general adult medical examination without abnormal findings: Secondary | ICD-10-CM | POA: Diagnosis not present

## 2015-04-15 LAB — COMPREHENSIVE METABOLIC PANEL
ALBUMIN: 4.2 g/dL (ref 3.5–5.2)
ALT: 25 U/L (ref 0–53)
AST: 28 U/L (ref 0–37)
Alkaline Phosphatase: 66 U/L (ref 39–117)
BUN: 20 mg/dL (ref 6–23)
CO2: 25 meq/L (ref 19–32)
CREATININE: 0.9 mg/dL (ref 0.40–1.50)
Calcium: 9.1 mg/dL (ref 8.4–10.5)
Chloride: 101 mEq/L (ref 96–112)
GFR: 89.73 mL/min (ref >60.00)
Glucose, Bld: 93 mg/dL (ref 70–99)
Potassium: 4.6 mEq/L (ref 3.5–5.1)
SODIUM: 133 meq/L — AB (ref 135–145)
Total Bilirubin: 0.5 mg/dL (ref 0.2–1.2)
Total Protein: 6.7 g/dL (ref 6.0–8.3)

## 2015-04-15 LAB — CBC
HEMATOCRIT: 38.7 % — AB (ref 39.0–52.0)
HEMOGLOBIN: 13.3 g/dL (ref 13.0–17.0)
MCHC: 34.3 g/dL (ref 30.0–36.0)
MCV: 96.2 fl (ref 78.0–100.0)
Platelets: 189 10*3/uL (ref 150.0–400.0)
RBC: 4.02 Mil/uL — ABNORMAL LOW (ref 4.22–5.81)
RDW: 13.3 % (ref 11.5–15.5)
WBC: 6.8 10*3/uL (ref 4.0–10.5)

## 2015-04-15 LAB — PROLACTIN: PROLACTIN: 7.8 ng/mL (ref 2.1–17.1)

## 2015-04-15 NOTE — Addendum Note (Signed)
Addended by: Peggyann Shoals on: 04/15/2015 09:22 AM   Modules accepted: Orders

## 2015-04-16 ENCOUNTER — Other Ambulatory Visit: Payer: Medicare Other

## 2015-04-16 DIAGNOSIS — Z0000 Encounter for general adult medical examination without abnormal findings: Secondary | ICD-10-CM

## 2015-04-20 ENCOUNTER — Other Ambulatory Visit (INDEPENDENT_AMBULATORY_CARE_PROVIDER_SITE_OTHER): Payer: Medicare Other

## 2015-04-20 DIAGNOSIS — I10 Essential (primary) hypertension: Secondary | ICD-10-CM | POA: Diagnosis not present

## 2015-04-20 DIAGNOSIS — R937 Abnormal findings on diagnostic imaging of other parts of musculoskeletal system: Secondary | ICD-10-CM

## 2015-04-20 LAB — LIPID PANEL
CHOLESTEROL: 172 mg/dL (ref 0–200)
HDL: 64.2 mg/dL (ref >39.00)
LDL Cholesterol: 86 mg/dL (ref 0–99)
NonHDL: 107.8
Total CHOL/HDL Ratio: 3
Triglycerides: 111 mg/dL (ref 0.0–149.0)
VLDL: 22.2 mg/dL (ref 0.0–40.0)

## 2015-04-21 ENCOUNTER — Telehealth: Payer: Self-pay | Admitting: Internal Medicine

## 2015-04-21 DIAGNOSIS — R7989 Other specified abnormal findings of blood chemistry: Secondary | ICD-10-CM

## 2015-04-21 NOTE — Telephone Encounter (Signed)
LMOM on home number informing Pt to return call regarding lab results.

## 2015-04-21 NOTE — Telephone Encounter (Signed)
CMP showed a sodium of 133. CBC is okay. Testosterone 307 slightly low(received results via fax, will scan the results) FSH, LH and prolactin normal. Advise patient,  testosterone slightly low, recommend to recheck in few months or arrange a endocrinology referral. Sodium only slightly low, recheck on return to the office in 3 months

## 2015-04-21 NOTE — Telephone Encounter (Signed)
LMOM at home number for Pt to return call regarding lab results.

## 2015-04-22 ENCOUNTER — Telehealth: Payer: Self-pay | Admitting: Internal Medicine

## 2015-04-22 NOTE — Telephone Encounter (Signed)
Spoke with Pt, informed him of lab results. Informed him of low sodium level at 133, informed him that Dr. Larose Kells would recheck in several months. Informed him Testosterone is still low at 307, but informed him FSH, LH and prolactin is normal. Informed him we can either monitor testosterone over the next several months or place a referral to endocrinology. Pt would like a referral to endocrinology. Informed him that Endo would call and schedule appt at his convenience. Also, informed Pt that I believe lab results are on MyChart for him to view. Pt stated he believes that they are. Pt verbalized understanding of lab results.

## 2015-04-22 NOTE — Telephone Encounter (Signed)
Pre Visit letter sent  °

## 2015-04-22 NOTE — Telephone Encounter (Signed)
Pt returning your call. Best # [phone].

## 2015-04-30 NOTE — Telephone Encounter (Signed)
Addendum, I receive later on a fax with free a testosterone of 6.8 which is in the low side of normal

## 2015-05-04 ENCOUNTER — Other Ambulatory Visit: Payer: Self-pay | Admitting: Internal Medicine

## 2015-05-04 NOTE — Telephone Encounter (Signed)
Rx faxed to [facility].  

## 2015-05-04 NOTE — Telephone Encounter (Signed)
Ok 90 and 2

## 2015-05-04 NOTE — Telephone Encounter (Signed)
Pt is requesting refill on Clonazepam.  Last OV: 03/22/2015 Last Fill: 12/22/2014 #90 1RF UDS: 05/07/2014 Low risk  Please advise.

## 2015-05-04 NOTE — Telephone Encounter (Signed)
Rx printed, awaiting MD signature.  

## 2015-05-06 ENCOUNTER — Telehealth: Payer: Self-pay | Admitting: Internal Medicine

## 2015-05-06 NOTE — Telephone Encounter (Signed)
Rx printed, signed by MD and faxed to [facility].

## 2015-05-06 NOTE — Telephone Encounter (Signed)
Ok 90 and 1

## 2015-05-06 NOTE — Telephone Encounter (Signed)
Pt is requesting refill on Ambien.  Last OV: 03/22/2015  Last Fill: 11/16/2014 #90 1RF   Please advise.

## 2015-05-10 NOTE — Telephone Encounter (Signed)
Patient states that we are going to have to call Medicare and [place].  He says that insurance states that this needs to be coded as medically necessary. [place] [phone]

## 2015-05-12 ENCOUNTER — Telehealth: Payer: Self-pay

## 2015-05-12 NOTE — Telephone Encounter (Signed)
Left message for pt to return the call.

## 2015-05-13 ENCOUNTER — Ambulatory Visit (HOSPITAL_BASED_OUTPATIENT_CLINIC_OR_DEPARTMENT_OTHER)
Admission: RE | Admit: 2015-05-13 | Discharge: 2015-05-13 | Disposition: A | Discharge status: Home / Self Care | Payer: Medicare Other | Source: Ambulatory Visit | Attending: Internal Medicine | Admitting: Internal Medicine

## 2015-05-13 ENCOUNTER — Ambulatory Visit (INDEPENDENT_AMBULATORY_CARE_PROVIDER_SITE_OTHER): Payer: Medicare Other | Admitting: Internal Medicine

## 2015-05-13 ENCOUNTER — Encounter: Payer: Self-pay | Admitting: Internal Medicine

## 2015-05-13 DIAGNOSIS — Z13820 Encounter for screening for osteoporosis: Secondary | ICD-10-CM

## 2015-05-13 DIAGNOSIS — Z0000 Encounter for general adult medical examination without abnormal findings: Secondary | ICD-10-CM | POA: Diagnosis not present

## 2015-05-13 DIAGNOSIS — I10 Essential (primary) hypertension: Secondary | ICD-10-CM

## 2015-05-13 DIAGNOSIS — M8589 Other specified disorders of bone density and structure, multiple sites: Secondary | ICD-10-CM | POA: Diagnosis not present

## 2015-05-13 DIAGNOSIS — F1010 Alcohol abuse, uncomplicated: Secondary | ICD-10-CM

## 2015-05-13 DIAGNOSIS — Z23 Encounter for immunization: Secondary | ICD-10-CM

## 2015-05-13 DIAGNOSIS — M810 Age-related osteoporosis without current pathological fracture: Secondary | ICD-10-CM | POA: Diagnosis not present

## 2015-05-13 DIAGNOSIS — E291 Testicular hypofunction: Secondary | ICD-10-CM | POA: Diagnosis not present

## 2015-05-13 DIAGNOSIS — F411 Generalized anxiety disorder: Secondary | ICD-10-CM | POA: Diagnosis not present

## 2015-05-13 MED ORDER — THIAMINE HCL 100 MG PO TABS: 100.0000 mg | ORAL_TABLET | Freq: Every day | ORAL | Status: DC

## 2015-05-13 MED ORDER — ESCITALOPRAM OXALATE 10 MG PO TABS: 5.0000 mg | ORAL_TABLET | Freq: Every day | ORAL | Status: DC

## 2015-05-13 MED ORDER — CARVEDILOL 6.25 MG PO TABS: 6.2500 mg | ORAL_TABLET | Freq: Two times a day (BID) | ORAL | Status: DC

## 2015-05-13 MED ORDER — FOLIC ACID 1 MG PO TABS: 1.0000 mg | ORAL_TABLET | Freq: Every day | ORAL | Status: DC

## 2015-05-13 NOTE — Telephone Encounter (Signed)
Left msg to pt to contact ins/[employer] for medical necessity form.

## 2015-05-13 NOTE — Assessment & Plan Note (Addendum)
Reason BMP satisfactory, continue carvedilol EKG today sinus bradycardia, unable to upload the old EKG.

## 2015-05-13 NOTE — Progress Notes (Signed)
Subjective:    Patient ID: Bobby [name], male    DOB: September 16, 1951, 64 y.o.   MRN: [id number]  DOS:  05/13/2015 Type of visit - description :   Here for Medicare AWV: 1. Risk factors based on Past M, S, F history: reviewed 2. Physical Activities:  Bike riding   3. Depression/mood: neg screening, on meds    4. Hearing:  No problemss noted or reported   5. ADL's:  Works FT, independent   6. Fall Risk: had a fall while fishing, hit his knee , discussed prevention 7. home Safety: does feel safe at home   8. Height, weight, & visual acuity: see VS, sees eye doctor q year 9. Counseling: provided 10. Labs ordered based on risk factors: if needed   11. Referral Coordination: if needed 12. Care Plan, see assessment and plan  , written instructions provided  13. Cognitive Assessment: cognition and motor skills wnl 14. Care team updated ; to see endocrinology soon 15. End of life care discussed , has a healthcare POA  In addition, today we discussed the following: Hypertension, on carvedilol, reports no side effects Anxiety, on a reduced dose of Lexapro, symptoms well-controlled Recently diagnosed with hypogonadism, endocrinology visit pending Insomnia well-controlled    Review of Systems Constitutional: No fever. No chills. Unable to lose weight despite exercising regularly. No unusual sweats  HEENT: No dental problems, no ear discharge, no facial swelling, no voice changes. No eye discharge, no eye  redness , no  intolerance to light   Respiratory: No wheezing , no  difficulty breathing. No cough , no mucus production  Cardiovascular: No CP, no leg swelling , no  Palpitations  GI: no nausea, no vomiting, no diarrhea , no  abdominal pain.  No blood in the stools. No dysphagia, no odynophagia    Endocrine: No polyphagia, no polyuria , no polydipsia  GU: No dysuria, gross hematuria, difficulty urinating. No urinary urgency, no frequency.  Musculoskeletal:  Slipped and fell  while fishing, injured  the knee, still hurting but improving  Skin: No change in the color of the skin, palor , no  Rash  Allergic, immunologic: No environmental allergies , no  food allergies  Neurological: No dizziness no  syncope. No headaches. No diplopia, no slurred, no slurred speech, no motor deficits, no facial  Numbness  Hematological: No enlarged lymph nodes, no easy bruising , no unusual bleedings  Psychiatry: No suicidal ideas, no hallucinations, no beavior problems, no confusion.  No unusual/severe anxiety, no depression    Past Medical History  Diagnosis Date  . Hypertension   . Insomnia   . Anxiety and depression   . Hyponatremia 03-2013    admited     Past Surgical History  Procedure Laterality Date  . Shoulder surgery  1980s    right shoulder    History   Social History  . Marital Status: Married    Spouse Name: N/A  . Number of Children: 2  . Years of Education: N/A   Occupational History  . bussines owner     Social History Main Topics  . Smoking status: Never Smoker   . Smokeless tobacco: Never Used  . Alcohol Use: 0.0 oz/week    0 Standard drinks or equivalent per week     Comment: socially , 2 a day  . Drug Use: No  . Sexual Activity: Not on file   Other Topics Concern  . Not on file   Social History Narrative   "  Bobby [name]", Married, 2 children   mom passed away 05-23-2008                     Family History  Problem Relation Age of Onset  . Hypertension Mother   . Stroke Mother   . Colon cancer Neg Hx   . Prostate cancer Neg Hx   . CAD Neg Hx       Medication List       This list is accurate as of: 05/13/15  2:44 PM.  Always use your most recent med list.               carvedilol 6.25 MG tablet  Commonly known as:  COREG  Take 1 tablet (6.25 mg total) by mouth 2 (two) times daily.     clonazePAM 0.5 MG tablet  Commonly known as:  KLONOPIN  Take 1 tablet (0.5 mg total) by mouth 2 (two) times daily as needed for  anxiety.     escitalopram 10 MG tablet  Commonly known as:  LEXAPRO  Take 0.5 tablets (5 mg total) by mouth daily.     folic acid 1 MG tablet  Commonly known as:  FOLVITE  Take 1 tablet (1 mg total) by mouth daily.     multivitamin with minerals Tabs tablet  Take 1 tablet by mouth daily.     thiamine 100 MG tablet  Take 1 tablet (100 mg total) by mouth daily.     zolpidem 10 MG tablet  Commonly known as:  AMBIEN  Take 1 tablet (10 mg total) by mouth at bedtime as needed for sleep.           Objective:   Physical Exam BP 120/78 mmHg  Pulse 65  Temp(Src) 97.7 F (36.5 C) (Oral)  Ht 5\' 11"  (1.803 m)  Wt 190 lb 8 oz (86.41 kg)  BMI 26.58 kg/m2  SpO2 98% General:   Well developed, well nourished . NAD.  HEENT:  Normocephalic . Face symmetric, atraumatic. No thyromegaly, carotid pulses normal Lungs:  CTA B Normal respiratory effort, no intercostal retractions, no accessory muscle use. Heart: RRR,  no murmur.  no pretibial edema bilaterally  Abdomen:  Not distended, soft, non-tender. No rebound or rigidity. No mass,organomegaly Skin: Not pale. Not jaundice Neurologic:  alert & oriented X3.  Speech normal, gait appropriate for age and unassisted Psych--  Cognition and judgment appear intact.  Cooperative with normal attention span and concentration.  Behavior appropriate. No anxious or depressed appearing.       Assessment & Plan:   order DEX

## 2015-05-13 NOTE — Assessment & Plan Note (Addendum)
Td 2010 zostavax 2014  PNM shot --2015 prevnar-- today  had a Cscope aprox 2007, Dr Watt Climes  ---->  repeated Cscope 08-2009 ---->  Tubular adenoma per bx report------>  refer to GI   H/o slightly enlarged prostate, last DRE 2015, last PSA 2015 within normal, recheck next year   Diet-exercise discussed  DEXA was rec but never done

## 2015-05-13 NOTE — Telephone Encounter (Signed)
[employer] states that form was faxed this morning to [phone].

## 2015-05-13 NOTE — Assessment & Plan Note (Signed)
Slightly decreased free testosterone, complaining of low libido. Reports his brother has a history of low testosterone. Endocrinology eval pending

## 2015-05-13 NOTE — Telephone Encounter (Signed)
Will be on the look out for paperwork

## 2015-05-13 NOTE — Addendum Note (Signed)
Addended by: Kathlene November E on: 05/13/2015 02:48 PM   Modules accepted: Level of Service, SmartSet

## 2015-05-13 NOTE — Telephone Encounter (Signed)
Paperwork placed in Dr. Ethel Rana red folder for completion.

## 2015-05-13 NOTE — Assessment & Plan Note (Signed)
See note from last month, we are decreasing Lexapro dose to see if that helps with low libido, currently taking Lexapro 5 mg, libido has not changed. In the meantime, we found that he has a slightly decreased testosterone, endocrinology assessment pending. Plan: Continue Lexapro 5 mg, stop Lexapro next month

## 2015-05-13 NOTE — Telephone Encounter (Signed)
Pt will need to call [employer], Medicare and/or [employer] and have them fax [place] a form. They should have a form that Dr. Larose Kells can complete for medical necessity.

## 2015-05-13 NOTE — Assessment & Plan Note (Signed)
Reports two  glasses of wine daily

## 2015-05-13 NOTE — Telephone Encounter (Signed)
Unable to reach pre visit.  

## 2015-05-13 NOTE — Telephone Encounter (Signed)
Received paperwork from [employer].

## 2015-05-13 NOTE — Patient Instructions (Signed)
Get your blood work before you leave   Stop by the first floor and get the XR   Calorie counting:   MYFITNESSPAL     Fall Prevention and Home Safety Falls cause injuries and can affect all age groups. It is possible to use preventive measures to significantly decrease the likelihood of falls. There are many simple measures which can make your home safer and prevent falls. OUTDOORS  Repair cracks and edges of walkways and driveways.  Remove high doorway thresholds.  Trim shrubbery on the main path into your home.  Have good outside lighting.  Clear walkways of tools, rocks, debris, and clutter.  Check that handrails are not broken and are securely fastened. Both sides of steps should have handrails.  Have leaves, snow, and ice cleared regularly.  Use sand or salt on walkways during winter months.  In the garage, clean up grease or oil spills. BATHROOM  Install night lights.  Install grab bars by the toilet and in the tub and shower.  Use non-skid mats or decals in the tub or shower.  Place a plastic non-slip stool in the shower to sit on, if needed.  Keep floors dry and clean up all water on the floor immediately.  Remove soap buildup in the tub or shower on a regular basis.  Secure bath mats with non-slip, double-sided rug tape.  Remove throw rugs and tripping hazards from the floors. BEDROOMS  Install night lights.  Make sure a bedside light is easy to reach.  Do not use oversized bedding.  Keep a telephone by your bedside.  Have a firm chair with side arms to use for getting dressed.  Remove throw rugs and tripping hazards from the floor. KITCHEN  Keep handles on pots and pans turned toward the center of the stove. Use back burners when possible.  Clean up spills quickly and allow time for drying.  Avoid walking on wet floors.  Avoid hot utensils and knives.  Position shelves so they are not too high or low.  Place commonly used objects within  easy reach.  If necessary, use a sturdy step stool with a grab bar when reaching.  Keep electrical cables out of the way.  Do not use floor polish or wax that makes floors slippery. If you must use wax, use non-skid floor wax.  Remove throw rugs and tripping hazards from the floor. STAIRWAYS  Never leave objects on stairs.  Place handrails on both sides of stairways and use them. Fix any loose handrails. Make sure handrails on both sides of the stairways are as long as the stairs.  Check carpeting to make sure it is firmly attached along stairs. Make repairs to worn or loose carpet promptly.  Avoid placing throw rugs at the top or bottom of stairways, or properly secure the rug with carpet tape to prevent slippage. Get rid of throw rugs, if possible.  Have an electrician put in a light switch at the top and bottom of the stairs. OTHER FALL PREVENTION TIPS  Wear low-heel or rubber-soled shoes that are supportive and fit well. Wear closed toe shoes.  When using a stepladder, make sure it is fully opened and both spreaders are firmly locked. Do not climb a closed stepladder.  Add color or contrast paint or tape to grab bars and handrails in your home. Place contrasting color strips on first and last steps.  Learn and use mobility aids as needed. Install an electrical emergency response system.  Turn on lights  to avoid dark areas. Replace light bulbs that burn out immediately. Get light switches that glow.  Arrange furniture to create clear pathways. Keep furniture in the same place.  Firmly attach carpet with non-skid or double-sided tape.  Eliminate uneven floor surfaces.  Select a carpet pattern that does not visually hide the edge of steps.  Be aware of all pets. OTHER HOME SAFETY TIPS  Set the water temperature for 120 F (48.8 C).  Keep emergency numbers on or near the telephone.  Keep smoke detectors on every level of the home and near sleeping areas. Document  Released: 10/27/2002 Document Revised: 05/07/2012 Document Reviewed: 01/26/2012 [facility] Patient Information 2015 [place], [place]. This information is not intended to replace advice given to you by your health care provider. Make sure you discuss any questions you have with your health care provider.   Preventive Care for Adults Ages 64 and over  Blood pressure check.** / Every 1 to 2 years.  Lipid and cholesterol check.**/ Every 5 years beginning at age 57.  Lung cancer screening. / Every year if you are aged 11-80 years and have a 30-pack-year history of smoking and currently smoke or have quit within the past 15 years. Yearly screening is stopped once you have quit smoking for at least 15 years or develop a health problem that would prevent you from having lung cancer treatment.  Fecal occult blood test (FOBT) of stool. / Every year beginning at age 86 and continuing until age 22. You may not have to do this test if you get a colonoscopy every 10 years.  Flexible sigmoidoscopy** or colonoscopy.** / Every 5 years for a flexible sigmoidoscopy or every 10 years for a colonoscopy beginning at age 7 and continuing until age 60.  Hepatitis C blood test.** / For all people born from [birth year] through 1965 and any individual with known risks for hepatitis C.  Abdominal aortic aneurysm (AAA) screening.** / A one-time screening for ages 6 to 74 years who are current or former smokers.  Skin self-exam. / Monthly.  Influenza vaccine. / Every year.  Tetanus, diphtheria, and acellular pertussis (Tdap/Td) vaccine.** / 1 dose of Td every 10 years.  Varicella vaccine.** / Consult your health care provider.  Zoster vaccine.** / 1 dose for adults aged 61 years or older.  Pneumococcal 13-valent conjugate (PCV13) vaccine.** / Consult your health care provider.  Pneumococcal polysaccharide (PPSV23) vaccine.** / 1 dose for all adults aged 25 years and older.  Meningococcal vaccine.** / Consult your  health care provider.  Hepatitis A vaccine.** / Consult your health care provider.  Hepatitis B vaccine.** / Consult your health care provider.  Haemophilus influenzae type b (Hib) vaccine.** / Consult your health care provider. **Family history and personal history of risk and conditions may change your health care provider's recommendations. Document Released: 01/02/2002 Document Revised: 11/11/2013 Document Reviewed: 04/03/2011 [facility] Patient Information 2015 [place], [place]. This information is not intended to replace advice given to you by your health care provider. Make sure you discuss any questions you have with your health care provider.

## 2015-05-13 NOTE — Progress Notes (Signed)
Pre visit review using our clinic review tool, if applicable. No additional management support is needed unless otherwise documented below in the visit note. 

## 2015-05-13 NOTE — Telephone Encounter (Signed)
Pt wants to make sure that we've called to get medical necessity for his Ambien to be covered. Please notify pt [phone].

## 2015-05-14 ENCOUNTER — Telehealth: Payer: Self-pay | Admitting: *Deleted

## 2015-05-14 NOTE — Telephone Encounter (Signed)
Received completed form from Dr. Larose Kells, faxed back to [place] at [phone]. Received fax confirmation on 05/14/2015 at 0953. Form sent for scanning into Pt's chart.

## 2015-05-14 NOTE — Telephone Encounter (Signed)
Quantity limit PA initiated for Ambien. Awaiting determination.

## 2015-05-17 NOTE — Telephone Encounter (Signed)
PA approved.

## 2015-05-26 ENCOUNTER — Ambulatory Visit (INDEPENDENT_AMBULATORY_CARE_PROVIDER_SITE_OTHER): Payer: Medicare Other | Admitting: Endocrinology

## 2015-05-26 ENCOUNTER — Encounter: Payer: Self-pay | Admitting: Endocrinology

## 2015-05-26 VITALS — BP 130/74 | HR 66 | Temp 98.0°F | Resp 16 | Ht 71.0 in | Wt 192.4 lb

## 2015-05-26 DIAGNOSIS — R6882 Decreased libido: Secondary | ICD-10-CM

## 2015-05-26 DIAGNOSIS — E291 Testicular hypofunction: Secondary | ICD-10-CM | POA: Diagnosis not present

## 2015-05-26 MED ORDER — TESTOSTERONE 20.25 MG/ACT (1.62%) TD GEL: TRANSDERMAL | Status: DC

## 2015-05-26 NOTE — Progress Notes (Signed)
Patient ID: BERTIL BRICKEY, male   DOB: 03-01-1951, 64 y.o.   MRN: [id number]          Chief complaint: Low testosterone  History of Present Illness  For the last year or so he has had significantly decreased sex drive. His wife thinks that he may have some decreased motivation also but does not complain of fatigue, lack of stamina or mood disturbance Since his brother who is 6 years younger was diagnosed with hypogonadism he was interested in getting his testosterone checked also and this was done in 5/16 by his PCP    There is no history of the following: Hot flushes, sweats, breast enlargement, long term steroid use, history of testicular injury mumps in childhood. No history of osteopenia or low impact fracture  Prior lab results show baselinetestosterone level of  No results found for: TESTOSTERONE  Prolactin level: 7.8 LH level: 3.9       Medication List       This list is accurate as of: 05/26/15  9:28 PM.  Always use your most recent med list.               carvedilol 6.25 MG tablet  Commonly known as:  COREG  Take 1 tablet (6.25 mg total) by mouth 2 (two) times daily.     clonazePAM 0.5 MG tablet  Commonly known as:  KLONOPIN  Take 1 tablet (0.5 mg total) by mouth 2 (two) times daily as needed for anxiety.     escitalopram 10 MG tablet  Commonly known as:  LEXAPRO  Take 0.5 tablets (5 mg total) by mouth daily.     folic acid 1 MG tablet  Commonly known as:  FOLVITE  Take 1 tablet (1 mg total) by mouth daily.     multivitamin with minerals Tabs tablet  Take 1 tablet by mouth daily.     Testosterone 20.25 MG/ACT (1.62%) Gel  Commonly known as:  ANDROGEL PUMP  1 pump on each shoulder daily     thiamine 100 MG tablet  Take 1 tablet (100 mg total) by mouth daily.     zolpidem 10 MG tablet  Commonly known as:  AMBIEN  Take 1 tablet (10 mg total) by mouth at bedtime as needed for sleep.        Allergies:  Allergies  Allergen  Reactions  . Tramadol     REACTION: hives    Past Medical History  Diagnosis Date  . Hypertension   . Insomnia   . Anxiety and depression   . Hyponatremia 03-2013    admited     Past Surgical History  Procedure Laterality Date  . Shoulder surgery  1980s    right shoulder    Family History  Problem Relation Age of Onset  . Hypertension Mother   . Stroke Mother   . Diabetes Mother   . Colon cancer Neg Hx   . Prostate cancer Neg Hx   . CAD Neg Hx     Social History:  reports that he has never smoked. He has never used smokeless tobacco. He reports that he drinks alcohol. He reports that he does not use illicit drugs.  Review of Systems  Eyes: Negative for blurred vision.  Neurological: Negative for dizziness and headaches.  Endo/Heme/Allergies: Negative for polydipsia.  Psychiatric/Behavioral: Negative for depression.       Was started on Lexapro 2 years ago when he was admitted for hyponatremia, does not think he was significantly depressed and  his Celexa is being tapered off, he feels stable with his mood   He has had a weight gain over the last 2 years of about  20 lbs but this is relatively stable now  Wt Readings from Last 3 Encounters:  05/26/15 192 lb 6.4 oz (87.272 kg)  05/13/15 190 lb 8 oz (86.41 kg)  03/22/15 191 lb 8 oz (86.864 kg)    No history of unusual headaches  No history of blurred vision including peripheral vision History of hypertension: Mild and treated with low-dose Coreg No history of abnormal fasting glucose or diabetes  He does not complain of daytime somnolence although he does have history of some snoring and his wife thinks that at times at night he may be either gasping very transiently with breathing Has not had any known sleep apnea Takes Ambien for occasional early insomnia  No history of thyroid disease: Thyroid levels have been normal  Lab Results  Component Value Date   TSH 1.15 03/22/2015     General Examination:   BP  130/74 mmHg  Pulse 66  Temp(Src) 98 F (36.7 C)  Resp 16  Ht 5\' 11"  (1.803 m)  Wt 192 lb 6.4 oz (87.272 kg)  BMI 26.85 kg/m2  SpO2 94%  GENERAL APPEARANCE averagely built and nourished,  mild abdominal obesity SKIN:normal, no rash or pigmentation.  HEENT:Oral mucosa normal. Normal oropharyngeal opening.  EYES:normal external appearance of eyes, Fundus exam shows normal optic discs  NECK:no lymphadenopathy, no thyromegaly.  CHEST: Gynecomastia absent LUNGS:clear to auscultation bilaterally, no wheezes, rhonchi, rales.   HEART:normal S1 And S2, no S3, S4, murmur or click.  ABDOMEN:no hepatosplenomegaly, no masses palpated, soft and not tender.   MALE GENITOURINARY:left testicle 3-3.5 cm and right testicle 3 cm.Both testicles are relatively soft and phallus normal  MUSCULOSKELETALNo enlargement or deformity of joints.  EXTREMITIES:no clubbing, no edema.  NEUROLOGIC EXAM: Biceps reflexes normal bilaterally.   Assessment/ Plan:   Hypogonadotropic hypogonadism  Although he is minimally symptomatic with only complaint of decreased libido he does have decreased testosterone levels; the second level of free testosterone is low normal compared to the first. Exam shows mild atrophic change in the testicles With LH level being normal and prolactin also in the normal range he likely has hypogonadotropic hypogonadism with no other evidence of pituitary gland disease  He will be given a trial of testosterone supplementation to see if his symptoms improve significantly Although he does not complain of fatigue he does have a history of depression and some decrease in motivation which may also improve  Discussed various options for testosterone supplementation including transdermal gel or liquid preparations, Androderm patch, testosterone injections and clomiphene.  Discussed pros and cons of various treatments For long-term  treatment a topical gel would be the most ideal treatment and recommended according to practice guidelines  He is agreeable to trying AndroGel 1.62% and this will be prescribed with one pump on each arm daily Discussed in detail the application process, timing of application, safety, effects and monitoring of treatment with periodic testosterone levels.  Brochure and co-pay card for AndroGel given  He will have repeat testosterone level in 6 weeks and follow-up for clinical evaluation also  Sigismund Cross 05/26/2015, 9:28 PM

## 2015-05-28 ENCOUNTER — Telehealth: Payer: Self-pay | Admitting: Endocrinology

## 2015-05-28 NOTE — Telephone Encounter (Signed)
Pt needs PA on androgel # [phone] Please make this expedited with them

## 2015-05-28 NOTE — Telephone Encounter (Signed)
PA sent today

## 2015-06-01 ENCOUNTER — Other Ambulatory Visit: Payer: Self-pay | Admitting: *Deleted

## 2015-06-01 MED ORDER — TESTOSTERONE 30 MG/ACT TD SOLN: TRANSDERMAL | Status: DC

## 2015-06-24 ENCOUNTER — Telehealth: Payer: Self-pay | Admitting: Endocrinology

## 2015-06-24 NOTE — Telephone Encounter (Signed)
Please give them the status of the androgel rx PA

## 2015-06-24 NOTE — Telephone Encounter (Signed)
We have sent several prescriptions to try and get them approved, all were denied.  I did call the patients insurance company today who said that this patient does not have an rx drug coverage plan and no matter what we send in it will not be approved.  Message left on patients vm informing him of this.

## 2015-06-25 ENCOUNTER — Telehealth: Payer: Self-pay | Admitting: Endocrinology

## 2015-06-25 NOTE — Telephone Encounter (Signed)
[employer] is the prescription coverage RX BIN: [id number]; ID # [id number]

## 2015-09-10 DIAGNOSIS — Z23 Encounter for immunization: Secondary | ICD-10-CM | POA: Diagnosis not present

## 2015-09-28 ENCOUNTER — Ambulatory Visit (INDEPENDENT_AMBULATORY_CARE_PROVIDER_SITE_OTHER): Payer: Medicare Other | Admitting: Internal Medicine

## 2015-09-28 ENCOUNTER — Encounter: Payer: Self-pay | Admitting: Internal Medicine

## 2015-09-28 VITALS — BP 124/72 | HR 65 | Temp 98.2°F | Ht 71.0 in | Wt 191.0 lb

## 2015-09-28 DIAGNOSIS — Z09 Encounter for follow-up examination after completed treatment for conditions other than malignant neoplasm: Secondary | ICD-10-CM | POA: Insufficient documentation

## 2015-09-28 DIAGNOSIS — J069 Acute upper respiratory infection, unspecified: Secondary | ICD-10-CM | POA: Diagnosis not present

## 2015-09-28 NOTE — Progress Notes (Signed)
Pre visit review using our clinic review tool, if applicable. No additional management support is needed unless otherwise documented below in the visit note. 

## 2015-09-28 NOTE — Assessment & Plan Note (Signed)
URI, mild bronchitis?. Recommend conservative treatment, see instructions. If not better in 2 or 3 days patient will call, I will recommend antibiotics as he has mild chest congestion.

## 2015-09-28 NOTE — Progress Notes (Signed)
Subjective:    Patient ID: Bobby [name], male    DOB: 03-07-1951, 64 y.o.   MRN: [id number]  DOS:  09/28/2015 Type of visit - description : acute Interval history: Sx started around 4 days ago when he started to feel tired. 3 days ago developed dry cough and chest congestion. Taking DayQuil and NyQuil with good response of his symptoms. Patient is concerned because his coworkers have the same symptoms and wife diagnosed with pneumonia 2 weeks ago, she is feeling better now.   Review of Systems Subjective fever and chills with the onset of symptoms only. No chest pain or difficulty breathing No nausea, vomiting, diarrhea. Mild myalgias the first day of symptoms. No sputum production. + RN,  not uncommon for this time  of the year, thinks is allergies  Past Medical History  Diagnosis Date  . Hypertension   . Insomnia   . Anxiety and depression   . Hyponatremia 03-2013    admited     Past Surgical History  Procedure Laterality Date  . Shoulder surgery  1980s    right shoulder    Social History   Social History  . Marital Status: Married    Spouse Name: N/A  . Number of Children: 2  . Years of Education: N/A   Occupational History  . bussines owner     Social History Main Topics  . Smoking status: Never Smoker   . Smokeless tobacco: Never Used  . Alcohol Use: 0.0 oz/week    0 Standard drinks or equivalent per week     Comment: socially , 2 a day  . Drug Use: No  . Sexual Activity: Not on file   Other Topics Concern  . Not on file   Social History Narrative   "Bobby [name]", Married, 2 children   mom passed away 2008-05-07                        Medication List       This list is accurate as of: 09/28/15  5:53 PM.  Always use your most recent med list.               carvedilol 6.25 MG tablet  Commonly known as:  COREG  Take 1 tablet (6.25 mg total) by mouth 2 (two) times daily.     clonazePAM 0.5 MG tablet  Commonly known as:  KLONOPIN  Take 1  tablet (0.5 mg total) by mouth 2 (two) times daily as needed for anxiety.     escitalopram 10 MG tablet  Commonly known as:  LEXAPRO  Take 0.5 tablets (5 mg total) by mouth daily.     folic acid 1 MG tablet  Commonly known as:  FOLVITE  Take 1 tablet (1 mg total) by mouth daily.     multivitamin with minerals Tabs tablet  Take 1 tablet by mouth daily.     Testosterone 30 MG/ACT Soln  Commonly known as:  AXIRON  Use on application under each arm daily     thiamine 100 MG tablet  Take 1 tablet (100 mg total) by mouth daily.     zolpidem 10 MG tablet  Commonly known as:  AMBIEN  Take 1 tablet (10 mg total) by mouth at bedtime as needed for sleep.           Objective:   Physical Exam BP 124/72 mmHg  Pulse 65  Temp(Src) 98.2 F (36.8 C) (Oral)  Ht 5'  11" (1.803 m)  Wt 191 lb (86.637 kg)  BMI 26.65 kg/m2  SpO2 95% General:   Well developed, well nourished . NAD.  HEENT:  Normocephalic . Face symmetric, atraumatic. TMs normal. Nose is slightly congested, sinuses no TTP. Throat symmetric. Lungs:  CTA B except for very few rhonchi that clear with cough. Normal respiratory effort, no intercostal retractions, no accessory muscle use. Heart: RRR,  no murmur.  No pretibial edema bilaterally  Skin: Not pale. Not jaundice Neurologic:  alert & oriented X3.  Speech normal, gait appropriate for age and unassisted Psych--  Cognition and judgment appear intact.  Cooperative with normal attention span and concentration.  Behavior appropriate. No anxious or depressed appearing.      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 Insomnia Hyponatremia DX 2014, admitted  Plan: URI, mild bronchitis?. Recommend conservative treatment, see instructions. If not better in 2 or 3 days patient will call, I will recommend antibiotics as he has mild chest  congestion.

## 2015-09-28 NOTE — Patient Instructions (Signed)
Rest, fluids , tylenol  For cough: Take DayQuil or NyQuil as needed until better  For nasal congestion Use OTC   Flonase : 2 nasal sprays on each side of the nose daily until you feel better   Call if not gradually better over the next  few days    Call anytime if the symptoms are severe

## 2015-09-29 DIAGNOSIS — R05 Cough: Secondary | ICD-10-CM | POA: Diagnosis not present

## 2015-09-29 DIAGNOSIS — J0140 Acute pansinusitis, unspecified: Secondary | ICD-10-CM | POA: Diagnosis not present

## 2015-10-01 DIAGNOSIS — Z86010 Personal history of colonic polyps: Secondary | ICD-10-CM | POA: Diagnosis not present

## 2015-10-01 DIAGNOSIS — K5730 Diverticulosis of large intestine without perforation or abscess without bleeding: Secondary | ICD-10-CM | POA: Diagnosis not present

## 2015-10-01 DIAGNOSIS — Z09 Encounter for follow-up examination after completed treatment for conditions other than malignant neoplasm: Secondary | ICD-10-CM | POA: Diagnosis not present

## 2015-10-01 LAB — HM COLONOSCOPY

## 2015-10-04 ENCOUNTER — Encounter: Payer: Self-pay | Admitting: Internal Medicine

## 2015-10-04 ENCOUNTER — Ambulatory Visit (INDEPENDENT_AMBULATORY_CARE_PROVIDER_SITE_OTHER): Payer: Medicare Other | Admitting: Internal Medicine

## 2015-10-04 VITALS — BP 128/78 | HR 66 | Temp 97.5°F | Ht 71.0 in | Wt 193.2 lb

## 2015-10-04 DIAGNOSIS — R05 Cough: Secondary | ICD-10-CM | POA: Diagnosis not present

## 2015-10-04 DIAGNOSIS — Z09 Encounter for follow-up examination after completed treatment for conditions other than malignant neoplasm: Secondary | ICD-10-CM | POA: Diagnosis not present

## 2015-10-04 DIAGNOSIS — R059 Cough, unspecified: Secondary | ICD-10-CM

## 2015-10-04 MED ORDER — AZITHROMYCIN 250 MG PO TABS: ORAL_TABLET | ORAL | Status: DC

## 2015-10-04 MED ORDER — HYDROCODONE-HOMATROPINE 5-1.5 MG/5ML PO SYRP
5.0000 mL | ORAL_SOLUTION | Freq: Every evening | ORAL | Status: DC | PRN
Start: 2015-10-04 — End: 2015-11-11

## 2015-10-04 NOTE — Progress Notes (Signed)
Subjective:    Patient ID: Bobby [name], male    DOB: 12/02/1950, 64 y.o.   MRN: [id number]  DOS:  10/04/2015 Type of visit - description : Acute visit Interval history: After I saw him last few days ago, he did not get better, went to urgent care, prescribed Augmentin. Despite the treatment, he continue with cough, worse at night, mostly dry. He continue taking OTCs. Incidentally, had a colonoscopy 3 days ago, reportedly normal   Review of Systems  Denies fever chills No chest pain or difficulty breathing No wheezing Past Medical History  Diagnosis Date  . Hypertension   . Insomnia   . Anxiety and depression   . Hyponatremia 03-2013    admited     Past Surgical History  Procedure Laterality Date  . Shoulder surgery  1980s    right shoulder    Social History   Social History  . Marital Status: Married    Spouse Name: N/A  . Number of Children: 2  . Years of Education: N/A   Occupational History  . bussines owner     Social History Main Topics  . Smoking status: Never Smoker   . Smokeless tobacco: Never Used  . Alcohol Use: 0.0 oz/week    0 Standard drinks or equivalent per week     Comment: socially , 2 a day  . Drug Use: No  . Sexual Activity: Not on file   Other Topics Concern  . Not on file   Social History Narrative   [name], Married, 2 children   mom passed away 05/11/08                        Medication List       This list is accurate as of: 10/04/15  9:00 PM.  Always use your most recent med list.               azithromycin 250 MG tablet  Commonly known as:  ZITHROMAX Z-PAK  2 tabs a day the first day, then 1 tab a day x 4 days     carvedilol 6.25 MG tablet  Commonly known as:  COREG  Take 1 tablet (6.25 mg total) by mouth 2 (two) times daily.     clonazePAM 0.5 MG tablet  Commonly known as:  KLONOPIN  Take 1 tablet (0.5 mg total) by mouth 2 (two) times daily as needed for anxiety.     escitalopram 10 MG tablet    Commonly known as:  LEXAPRO  Take 0.5 tablets (5 mg total) by mouth daily.     folic acid 1 MG tablet  Commonly known as:  FOLVITE  Take 1 tablet (1 mg total) by mouth daily.     HYDROcodone-homatropine 5-1.5 MG/5ML syrup  Commonly known as:  HYCODAN  Take 5 mLs by mouth at bedtime as needed for cough.     multivitamin with minerals Tabs tablet  Take 1 tablet by mouth daily.     Testosterone 30 MG/ACT Soln  Commonly known as:  AXIRON  Use on application under each arm daily     thiamine 100 MG tablet  Take 1 tablet (100 mg total) by mouth daily.     zolpidem 10 MG tablet  Commonly known as:  AMBIEN  Take 1 tablet (10 mg total) by mouth at bedtime as needed for sleep.           Objective:   Physical Exam BP  128/78 mmHg  Pulse 66  Temp(Src) 97.5 F (36.4 C) (Oral)  Ht 5\' 11"  (1.803 m)  Wt 193 lb 4 oz (87.658 kg)  BMI 26.96 kg/m2  SpO2 96% General:   Well developed, well nourished . NAD.  HEENT:  Normocephalic . Face symmetric, atraumatic. TMs normal, throat no red, nose congested, sinuses not TTP Lungs:  CTA B few rhonchi with cough otherwise normal  Normal respiratory effort, no intercostal retractions, no accessory muscle use. Heart: RRR,  no murmur.  No pretibial edema bilaterally  Skin: Not pale. Not jaundice Neurologic:  alert & oriented X3.  Speech normal, gait appropriate for age and unassisted Psych--  Cognition and judgment appear intact.  Cooperative with normal attention span and concentration.  Behavior appropriate. No anxious or depressed appearing.      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 Insomnia Hyponatremia DX 2014, admitted  Plan Cough: Persisting cough, atypical bronchitis? Discontinue augmentin, start a Z-Pak, symptom control  w/ Mucinex, Flonase and hydrocodone. (Allergic to tramadol but  can take  codeine without problems):

## 2015-10-04 NOTE — Patient Instructions (Signed)
Rest, fluids , tylenol  For cough: Take Mucinex DM twice a day as needed until better  For nasal congestion Use OTC Nasocort or Flonase : 2 nasal sprays on each side of the nose daily until you feel better  For persisting cough at night, take hydrocodone.  Stop your current antibiotic and start taking Zithromax   Call if not gradually better over the next  10 days  Call anytime if the symptoms are severe  '

## 2015-10-04 NOTE — Assessment & Plan Note (Signed)
Cough: Persisting cough, atypical bronchitis? Discontinue augmentin, start a Z-Pak, symptom control  w/ Mucinex, Flonase and hydrocodone. (Allergic to tramadol but  can take codeine without problems):

## 2015-10-04 NOTE — Progress Notes (Signed)
Pre visit review using our clinic review tool, if applicable. No additional management support is needed unless otherwise documented below in the visit note. 

## 2015-10-06 ENCOUNTER — Other Ambulatory Visit: Payer: Self-pay | Admitting: Internal Medicine

## 2015-10-06 NOTE — Telephone Encounter (Signed)
#  60 and 2 refills

## 2015-10-06 NOTE — Telephone Encounter (Signed)
Rx faxed to [facility].  

## 2015-10-06 NOTE — Telephone Encounter (Signed)
Pt is requesting refill on Clonazepam.  Last OV: 10/04/2015 Last Fill: 05/04/2015 #90 and 2RF Pt can take 1 tablet bid PRN UDS: 05/07/2014 Low risk  Needs UDS and contract at next OV.  Please advise.

## 2015-10-06 NOTE — Telephone Encounter (Signed)
Rx printed, awaiting MD signature.  

## 2015-10-12 ENCOUNTER — Encounter: Payer: Self-pay | Admitting: Internal Medicine

## 2015-10-25 ENCOUNTER — Telehealth: Payer: Self-pay | Admitting: Endocrinology

## 2015-10-25 ENCOUNTER — Other Ambulatory Visit: Payer: Self-pay | Admitting: *Deleted

## 2015-10-25 DIAGNOSIS — E291 Testicular hypofunction: Secondary | ICD-10-CM

## 2015-10-25 NOTE — Telephone Encounter (Signed)
Patient is coming to our office 12.12.16 and would need lab orders put in [facility] for lab appointment at [place]   Please notify F[place] when complete so she can call the patient    Thank you

## 2015-10-25 NOTE — Telephone Encounter (Signed)
Total testosterone

## 2015-10-25 NOTE — Telephone Encounter (Signed)
Ordered

## 2015-10-25 NOTE — Telephone Encounter (Signed)
Please advise on what labs are needed?

## 2015-10-26 ENCOUNTER — Other Ambulatory Visit (INDEPENDENT_AMBULATORY_CARE_PROVIDER_SITE_OTHER): Payer: Medicare Other

## 2015-10-26 DIAGNOSIS — E291 Testicular hypofunction: Secondary | ICD-10-CM | POA: Diagnosis not present

## 2015-10-27 LAB — TESTOSTERONE: Testosterone: 492 ng/dL (ref 300–890)

## 2015-11-01 ENCOUNTER — Encounter: Payer: Self-pay | Admitting: Endocrinology

## 2015-11-01 ENCOUNTER — Ambulatory Visit (INDEPENDENT_AMBULATORY_CARE_PROVIDER_SITE_OTHER): Payer: Medicare Other | Admitting: Endocrinology

## 2015-11-01 VITALS — BP 128/74 | HR 71 | Temp 98.3°F | Resp 14 | Ht 71.0 in | Wt 191.8 lb

## 2015-11-01 DIAGNOSIS — E291 Testicular hypofunction: Secondary | ICD-10-CM

## 2015-11-01 NOTE — Patient Instructions (Signed)
Same doses

## 2015-11-01 NOTE — Progress Notes (Signed)
Patient ID: Bobby [name], male   DOB: November 09, 1951, 64 y.o.   MRN: [id number]          Chief complaint: Low testosterone, follow-up  History of Present Illness  The patient was found to have hypogonadism when he was having symptoms of decreased libido, decreased motivation but no significant fatigue.  He was also concerned because of family History of hypogonadism and his brother The exam showed left testicle 3-3.5 cm and right testicle 3 cm.Both testicles are relatively soft   Prior lab results show baselinetestosterone level of 307, done twice in 5/16 Free testosterone was slightly low on the first test but low normal at 6.9 on the second test  He was given the trial of testosterone supplementation starting in 7/16 with Axiron which is the preferred drug on his plan He is applying one pump under each arm daily but has not been seen in follow-up since then  Currently he says he feels less tired and also his libido as well as motivation level is improved  Lab Results  Component Value Date   TESTOSTERONE 492 10/26/2015    Previous labs: Prolactin level: 7.8 LH level: 3.9       Medication List       This list is accurate as of: 11/01/15 11:59 PM.  Always use your most recent med list.               carvedilol 6.25 MG tablet  Commonly known as:  COREG  Take 1 tablet (6.25 mg total) by mouth 2 (two) times daily.     clonazePAM 0.5 MG tablet  Commonly known as:  KLONOPIN  Take 1 tablet (0.5 mg total) by mouth 2 (two) times daily as needed for anxiety.     escitalopram 10 MG tablet  Commonly known as:  LEXAPRO  Take 0.5 tablets (5 mg total) by mouth daily.     folic acid 1 MG tablet  Commonly known as:  FOLVITE  Take 1 tablet (1 mg total) by mouth daily.     HYDROcodone-homatropine 5-1.5 MG/5ML syrup  Commonly known as:  HYCODAN  Take 5 mLs by mouth at bedtime as needed for cough.     multivitamin with minerals Tabs tablet  Take 1 tablet by mouth  daily.     Testosterone 30 MG/ACT Soln  Commonly known as:  AXIRON  Use on application under each arm daily     thiamine 100 MG tablet  Take 1 tablet (100 mg total) by mouth daily.     zolpidem 10 MG tablet  Commonly known as:  AMBIEN  Take 1 tablet (10 mg total) by mouth at bedtime as needed for sleep.        Allergies:  Allergies  Allergen Reactions  . Tramadol     REACTION: hives    Past Medical History  Diagnosis Date  . Hypertension   . Insomnia   . Anxiety and depression   . Hyponatremia 03-2013    admited     Past Surgical History  Procedure Laterality Date  . Shoulder surgery  1980s    right shoulder    Family History  Problem Relation Age of Onset  . Hypertension Mother   . Stroke Mother   . Diabetes Mother   . Colon cancer Neg Hx   . Prostate cancer Neg Hx   . CAD Neg Hx     Social History:  reports that he has never smoked. He has never used smokeless tobacco.  He reports that he drinks alcohol. He reports that he does not use illicit drugs.  Review of Systems   He has had a weight gain over the last 2 years of about  20 lbs but this is relatively stable now  Wt Readings from Last 3 Encounters:  11/01/15 191 lb 12.8 oz (87 kg)  10/04/15 193 lb 4 oz (87.658 kg)  09/28/15 191 lb (86.637 kg)    General Examination:   BP 128/74 mmHg  Pulse 71  Temp(Src) 98.3 F (36.8 C)  Resp 14  Ht 5\' 11"  (1.803 m)  Wt 191 lb 12.8 oz (87 kg)  BMI 26.76 kg/m2  SpO2 96%   Assessment/ Plan:   Hypogonadotropic hypogonadism  Although he had only mild symptoms at diagnosis he had relatively low levels of testosterone Subjectively he feels improved with more motivation and energy as well as improved libido with testosterone supplementation and his level is 490 with using Axiron, one pump under each arm daily He has no side effects and his last hemoglobin was normal   He wants to continue supplementation Discussed that he will need to be periodically  monitored by his PCP for prostatic status Follow-up in 6 months Also given the option of continuing follow-up with PCP  [facility] 11/02/2015, 9:21 AM

## 2015-11-10 ENCOUNTER — Other Ambulatory Visit: Payer: Self-pay | Admitting: Internal Medicine

## 2015-11-11 ENCOUNTER — Ambulatory Visit (INDEPENDENT_AMBULATORY_CARE_PROVIDER_SITE_OTHER): Payer: Medicare Other | Admitting: Internal Medicine

## 2015-11-11 ENCOUNTER — Telehealth: Payer: Self-pay | Admitting: Internal Medicine

## 2015-11-11 ENCOUNTER — Encounter: Payer: Self-pay | Admitting: Internal Medicine

## 2015-11-11 ENCOUNTER — Ambulatory Visit (HOSPITAL_BASED_OUTPATIENT_CLINIC_OR_DEPARTMENT_OTHER)
Admission: RE | Admit: 2015-11-11 | Discharge: 2015-11-11 | Disposition: A | Discharge status: Home / Self Care | Payer: Medicare Other | Source: Ambulatory Visit | Attending: Internal Medicine | Admitting: Internal Medicine

## 2015-11-11 VITALS — BP 128/80 | HR 71 | Temp 97.7°F | Ht 72.0 in | Wt 191.1 lb

## 2015-11-11 DIAGNOSIS — R059 Cough, unspecified: Secondary | ICD-10-CM

## 2015-11-11 DIAGNOSIS — R05 Cough: Secondary | ICD-10-CM

## 2015-11-11 DIAGNOSIS — J189 Pneumonia, unspecified organism: Secondary | ICD-10-CM | POA: Insufficient documentation

## 2015-11-11 DIAGNOSIS — Z09 Encounter for follow-up examination after completed treatment for conditions other than malignant neoplasm: Secondary | ICD-10-CM

## 2015-11-11 MED ORDER — MOXIFLOXACIN HCL 400 MG PO TABS: 400.0000 mg | ORAL_TABLET | Freq: Every day | ORAL | Status: DC

## 2015-11-11 MED ORDER — ZOLPIDEM TARTRATE 10 MG PO TABS: 10.0000 mg | ORAL_TABLET | Freq: Every evening | ORAL | Status: DC | PRN

## 2015-11-11 MED ORDER — CLONAZEPAM 0.5 MG PO TABS: 0.5000 mg | ORAL_TABLET | Freq: Two times a day (BID) | ORAL | Status: DC | PRN

## 2015-11-11 MED ORDER — BUDESONIDE-FORMOTEROL FUMARATE 160-4.5 MCG/ACT IN AERO: 2.0000 | INHALATION_SPRAY | Freq: Two times a day (BID) | RESPIRATORY_TRACT | Status: DC

## 2015-11-11 MED ORDER — THIAMINE HCL 100 MG PO TABS: 100.0000 mg | ORAL_TABLET | Freq: Every day | ORAL | Status: DC

## 2015-11-11 MED ORDER — PANTOPRAZOLE SODIUM 40 MG PO TBEC: 40.0000 mg | DELAYED_RELEASE_TABLET | Freq: Every day | ORAL | Status: DC

## 2015-11-11 MED ORDER — CARVEDILOL 6.25 MG PO TABS: 6.2500 mg | ORAL_TABLET | Freq: Two times a day (BID) | ORAL | Status: DC

## 2015-11-11 MED ORDER — FOLIC ACID 1 MG PO TABS: 1.0000 mg | ORAL_TABLET | Freq: Every day | ORAL | Status: AC

## 2015-11-11 MED ORDER — DOXYCYCLINE HYCLATE 100 MG PO TABS: 100.0000 mg | ORAL_TABLET | Freq: Two times a day (BID) | ORAL | Status: DC

## 2015-11-11 NOTE — Patient Instructions (Signed)
Before you leave the office:   [place] Schedule a routine office visit or check up to be done in  2 months  No  fasting  We can also    After you leave the office:  Stop by the first floor and get the XR    Pantoprazole 1 tab a day before breakfast  symbicort 2 puffs twice a day

## 2015-11-11 NOTE — Telephone Encounter (Signed)
Spoke with the patient wife, multiple questions answered

## 2015-11-11 NOTE — Telephone Encounter (Signed)
Call the pharmacy, cancel Avelox, I Sent a Prescription for Doxycycline

## 2015-11-11 NOTE — Telephone Encounter (Signed)
Called pharmacy canceled Avelox.

## 2015-11-11 NOTE — Telephone Encounter (Signed)
Pt wife called back after pt spoke to Dr. Larose Kells about having pneumonia. She has concerns/questions and would like to talk with Dr. Larose Kells. Ph# [phone].

## 2015-11-11 NOTE — Telephone Encounter (Signed)
Spoke with the patient wife, alternatives are:  Avelox out of pocket, levofloxacin (will need a BMP) or doxycycline. She elected doxycycline. Will send a prescription

## 2015-11-11 NOTE — Telephone Encounter (Signed)
Levofloxacin is suggested by pharmacy

## 2015-11-11 NOTE — Telephone Encounter (Signed)
Caller name: Self   Can be reached: [phone]  Pharmacy:  [id number] - [place], [place] [address] [phone] (Phone) [phone] (Fax)         Reason for call: Need a different rx called in for AVELOX) 400 MG tablet because insurance will not pay for this one.

## 2015-11-11 NOTE — Progress Notes (Signed)
Pre visit review using our clinic review tool, if applicable. No additional management support is needed unless otherwise documented below in the visit note. 

## 2015-11-11 NOTE — Progress Notes (Signed)
Subjective:    Patient ID: Bobby [name], male    DOB: Aug 05, 1951, 64 y.o.   MRN: [id number]  DOS:  11/11/2015 Type of visit - description : Follow-up, here with his wife Interval history:  Anxiety depression: off  Lexapro, feeling well Hypogonadism: On HRT, slightly more energetic. Cough, ongoing problem. Minimal sputum production   Review of Systems  No fever, chills When asked, admits to wheezing. No postnasal dripping or classic heartburn  Past Medical History  Diagnosis Date  . Hypertension   . Insomnia   . Anxiety and depression   . Hyponatremia 03-2013    admited     Past Surgical History  Procedure Laterality Date  . Shoulder surgery  1980s    right shoulder    Social History   Social History  . Marital Status: Married    Spouse Name: N/A  . Number of Children: 2  . Years of Education: N/A   Occupational History  . bussines owner     Social History Main Topics  . Smoking status: Never Smoker   . Smokeless tobacco: Never Used  . Alcohol Use: 0.0 oz/week    0 Standard drinks or equivalent per week     Comment: socially , 2 a day  . Drug Use: No  . Sexual Activity: Not on file   Other Topics Concern  . Not on file   Social History Narrative   "Bobby [name]", Married, 2 children   mom passed away May 16, 2008                        Medication List       This list is accurate as of: 11/11/15 11:59 PM.  Always use your most recent med list.               budesonide-formoterol 160-4.5 MCG/ACT inhaler  Commonly known as:  SYMBICORT  Inhale 2 puffs into the lungs 2 (two) times daily.     carvedilol 6.25 MG tablet  Commonly known as:  COREG  Take 1 tablet (6.25 mg total) by mouth 2 (two) times daily.     clonazePAM 0.5 MG tablet  Commonly known as:  KLONOPIN  Take 1 tablet (0.5 mg total) by mouth 2 (two) times daily as needed for anxiety.     doxycycline 100 MG tablet  Commonly known as:  VIBRA-TABS  Take 1 tablet (100 mg total) by  mouth 2 (two) times daily.     folic acid 1 MG tablet  Commonly known as:  FOLVITE  Take 1 tablet (1 mg total) by mouth daily.     multivitamin with minerals Tabs tablet  Take 1 tablet by mouth daily.     pantoprazole 40 MG tablet  Commonly known as:  PROTONIX  Take 1 tablet (40 mg total) by mouth daily.     Testosterone 30 MG/ACT Soln  Commonly known as:  AXIRON  Use on application under each arm daily     thiamine 100 MG tablet  Take 1 tablet (100 mg total) by mouth daily.     zolpidem 10 MG tablet  Commonly known as:  AMBIEN  Take 1 tablet (10 mg total) by mouth at bedtime as needed for sleep.           Objective:   Physical Exam BP 128/80 mmHg  Pulse 71  Temp(Src) 97.7 F (36.5 C) (Oral)  Ht 6' (1.829 m)  Wt 191 lb 2 oz [id number]  kg)  BMI 25.92 kg/m2  SpO2 97% General:   Well developed, well nourished . NAD.  HEENT:  Normocephalic . Face symmetric, atraumatic Lungs:  B rhonchi that clear with cough, mild wheezing bilaterally Normal respiratory effort, no intercostal retractions, no accessory muscle use. Heart: RRR,  no murmur.  No pretibial edema bilaterally  Skin: Not pale. Not jaundice Neurologic:  alert & oriented X3.  Speech normal, gait appropriate for age and unassisted Psych--  Cognition and judgment appear intact.  Cooperative with normal attention span and concentration.  Behavior appropriate. No anxious or depressed appearing.      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 Insomnia Hyponatremia DX 2014, admitted  PLAN: Cough (likely reactive airway disease):  Having cough for several weeks, after episode of bronchitis, S/P Z-Pak, Augmentin. Now  wheezing. Likely has reactive airway disease, no history of asthma- tobacco abuse.  Plan: Chest x-ray, Symbicort, pantoprazole as GERD may be a trigger. Reasons behind my  recommendation discuss with the patient and his wife, multiple questions asked and answered to the best of my ability; I'm not sure if she got completely satisfied with my response (" I don't know how you can tell he has asthma in 1 minute", I explained that I think he has reactive airway disease, I'm not ready to say he has asthma), pt declined a pulmonary referral. Addendum: Chest x-ray show pneumonia, recommend to add Avelox otherwise plan is the same  I discussed the finding with the patient and his wife extensively. In addition, due to insurance constraints, had to change avelox to doxycycline for 10 days. See phone notes. Anxiety depression: Off Lexapro, well-controlled Hypogonadism: On HRT, recommend to see Dr. Dwyane Dee Insomnia: Refill medicines, they seem to be working well. RTC 2 months to assess releasing  Today, I spent more than 42   min with the patient: >50% of the time counseling regards reactive airway disease, new diagnosis of pneumonia, alternatives to Avelox and  coordinating his care

## 2015-11-11 NOTE — Telephone Encounter (Signed)
Requesting:  Zolpidem Contract  None UDS  None Last OV   10/04/2015 Last Refill   #90 with 1 refills on 05/06/2015  Please Advise

## 2015-11-12 NOTE — Assessment & Plan Note (Signed)
Cough (likely reactive airway disease):  Having cough for several weeks, after episode of bronchitis, S/P Z-Pak, Augmentin. Now  wheezing. Likely has reactive airway disease, no history of asthma- tobacco abuse.  Plan: Chest x-ray, Symbicort, pantoprazole as GERD may be a trigger. Reasons behind my recommendation discuss with the patient and his wife, multiple questions asked and answered to the best of my ability; I'm not sure if she got completely satisfied with my response (" I don't know how you can tell he has asthma in 1 minute", I explained that I think he has reactive airway disease, I'm not ready to say he has asthma), pt declined a pulmonary referral. Addendum: Chest x-ray show pneumonia, recommend to add Avelox otherwise plan is the same  I discussed the finding with the patient and his wife extensively. In addition, due to insurance constraints, had to change avelox to doxycycline for 10 days. See phone notes. Anxiety depression: Off Lexapro, well-controlled Hypogonadism: On HRT, recommend to see Dr. Dwyane Dee Insomnia: Refill medicines, they seem to be working well. RTC 2 months to assess releasing

## 2015-11-21 DIAGNOSIS — E222 Syndrome of inappropriate secretion of antidiuretic hormone: Secondary | ICD-10-CM

## 2015-11-21 HISTORY — DX: Syndrome of inappropriate secretion of antidiuretic hormone: E22.2

## 2015-12-07 ENCOUNTER — Encounter: Payer: Self-pay | Admitting: Internal Medicine

## 2015-12-07 ENCOUNTER — Ambulatory Visit (HOSPITAL_BASED_OUTPATIENT_CLINIC_OR_DEPARTMENT_OTHER)
Admission: RE | Admit: 2015-12-07 | Discharge: 2015-12-07 | Disposition: A | Discharge status: Home / Self Care | Payer: Medicare Other | Source: Ambulatory Visit | Attending: Internal Medicine | Admitting: Internal Medicine

## 2015-12-07 ENCOUNTER — Ambulatory Visit (INDEPENDENT_AMBULATORY_CARE_PROVIDER_SITE_OTHER): Payer: Medicare Other | Admitting: Internal Medicine

## 2015-12-07 VITALS — BP 128/76 | HR 59 | Temp 97.7°F | Ht 72.0 in | Wt 185.1 lb

## 2015-12-07 DIAGNOSIS — J189 Pneumonia, unspecified organism: Secondary | ICD-10-CM | POA: Insufficient documentation

## 2015-12-07 DIAGNOSIS — R05 Cough: Secondary | ICD-10-CM

## 2015-12-07 DIAGNOSIS — R062 Wheezing: Secondary | ICD-10-CM | POA: Diagnosis not present

## 2015-12-07 DIAGNOSIS — R059 Cough, unspecified: Secondary | ICD-10-CM

## 2015-12-07 LAB — BASIC METABOLIC PANEL
BUN: 14 mg/dL (ref 6–23)
CALCIUM: 8.8 mg/dL (ref 8.4–10.5)
CO2: 28 mEq/L (ref 19–32)
Chloride: 94 mEq/L — ABNORMAL LOW (ref 96–112)
Creatinine, Ser: 0.89 mg/dL (ref 0.40–1.50)
GFR: 90.71 mL/min (ref >60.00)
Glucose, Bld: 91 mg/dL (ref 70–99)
Potassium: 4.5 mEq/L (ref 3.5–5.1)
Sodium: 128 mEq/L — ABNORMAL LOW (ref 135–145)

## 2015-12-07 LAB — CBC WITH DIFFERENTIAL/PLATELET
BASOS PCT: 0.3 % (ref 0.0–3.0)
Basophils Absolute: 0 10*3/uL (ref 0.0–0.1)
EOS ABS: 0.2 10*3/uL (ref 0.0–0.7)
Eosinophils Relative: 2 % (ref 0.0–5.0)
HEMATOCRIT: 42.3 % (ref 39.0–52.0)
Hemoglobin: 14.3 g/dL (ref 13.0–17.0)
LYMPHS ABS: 3.2 10*3/uL (ref 0.7–4.0)
LYMPHS PCT: 39.3 % (ref 12.0–46.0)
MCHC: 33.7 g/dL (ref 30.0–36.0)
MCV: 93.6 fl (ref 78.0–100.0)
Monocytes Absolute: 0.7 10*3/uL (ref 0.1–1.0)
Monocytes Relative: 8.9 % (ref 3.0–12.0)
Neutro Abs: 4.1 10*3/uL (ref 1.4–7.7)
Neutrophils Relative %: 49.5 % (ref 43.0–77.0)
PLATELETS: 238 10*3/uL (ref 150.0–400.0)
RBC: 4.53 Mil/uL (ref 4.22–5.81)
RDW: 13.9 % (ref 11.5–15.5)
WBC: 8.2 10*3/uL (ref 4.0–10.5)

## 2015-12-07 MED ORDER — ALBUTEROL SULFATE HFA 108 (90 BASE) MCG/ACT IN AERS: 2.0000 | INHALATION_SPRAY | Freq: Four times a day (QID) | RESPIRATORY_TRACT | Status: DC | PRN

## 2015-12-07 MED ORDER — PREDNISONE 10 MG PO TABS: ORAL_TABLET | ORAL | Status: DC

## 2015-12-07 MED ORDER — BUDESONIDE-FORMOTEROL FUMARATE 160-4.5 MCG/ACT IN AERO: 2.0000 | INHALATION_SPRAY | Freq: Two times a day (BID) | RESPIRATORY_TRACT | Status: DC

## 2015-12-07 NOTE — Progress Notes (Signed)
Pre visit review using our clinic review tool, if applicable. No additional management support is needed unless otherwise documented below in the visit note. 

## 2015-12-07 NOTE — Patient Instructions (Signed)
BEFORE YOU LEAVE THE OFFICE: GO TO THE LAB  Get the blood work    GO TO THE FRONT DESK schedule a routine office visit or check up to be done in  3 months       AFTER YOU LEAVE THE OFFICE: Stop by the first floor and get the XR   Continue with Symbicort Use albuterol as needed for cough and wheezing Prednisone for a few days

## 2015-12-07 NOTE — Progress Notes (Signed)
Subjective:    Patient ID: Bobby [name], male    DOB: 03/17/1951, 65 y.o.   MRN: [id number]  DOS:  12/07/2015 Type of visit - description : Acute, not feeling better Interval history: Was treated for pneumonia last month, finish antibiotics, he felt 90% better but in the last few days has return with occasional clear/white sputum. Also he hears large airway congestion, not necessarily wheezing.   Review of Systems Denies fever chills Mild sinus congestion for 2 days. No difficulty breathing No GERD symptoms No hemoptysis.  Past Medical History  Diagnosis Date  . Hypertension   . Insomnia   . Anxiety and depression   . Hyponatremia 03-2013    admited     Past Surgical History  Procedure Laterality Date  . Shoulder surgery  1980s    right shoulder    Social History   Social History  . Marital Status: Married    Spouse Name: N/A  . Number of Children: 2  . Years of Education: N/A   Occupational History  . bussines owner     Social History Main Topics  . Smoking status: Never Smoker   . Smokeless tobacco: Never Used  . Alcohol Use: 0.0 oz/week    0 Standard drinks or equivalent per week     Comment: socially , 2 a day  . Drug Use: No  . Sexual Activity: Not on file   Other Topics Concern  . Not on file   Social History Narrative   "Bobby [name]", Married, 2 children   mom passed away 04/25/08                        Medication List       This list is accurate as of: 12/07/15 11:59 PM.  Always use your most recent med list.               albuterol 108 (90 Base) MCG/ACT inhaler  Commonly known as:  VENTOLIN HFA  Inhale 2 puffs into the lungs every 6 (six) hours as needed for wheezing or shortness of breath.     budesonide-formoterol 160-4.5 MCG/ACT inhaler  Commonly known as:  SYMBICORT  Inhale 2 puffs into the lungs 2 (two) times daily.     carvedilol 6.25 MG tablet  Commonly known as:  COREG  Take 1 tablet (6.25 mg total) by mouth 2 (two)  times daily.     clonazePAM 0.5 MG tablet  Commonly known as:  KLONOPIN  Take 1 tablet (0.5 mg total) by mouth 2 (two) times daily as needed for anxiety.     folic acid 1 MG tablet  Commonly known as:  FOLVITE  Take 1 tablet (1 mg total) by mouth daily.     multivitamin with minerals Tabs tablet  Take 1 tablet by mouth daily.     pantoprazole 40 MG tablet  Commonly known as:  PROTONIX  Take 1 tablet (40 mg total) by mouth daily.     predniSONE 10 MG tablet  Commonly known as:  DELTASONE  4 tablets x 2 days, 3 tabs x 2 days, 2 tabs x 2 days, 1 tab x 2 days     Testosterone 30 MG/ACT Soln  Commonly known as:  AXIRON  Use on application under each arm daily     thiamine 100 MG tablet  Take 1 tablet (100 mg total) by mouth daily.     zolpidem 10 MG tablet  Commonly known  as:  AMBIEN  Take 1 tablet (10 mg total) by mouth at bedtime as needed for sleep.           Objective:   Physical Exam BP 128/76 mmHg  Pulse 59  Temp(Src) 97.7 F (36.5 C) (Oral)  Ht 6' (1.829 m)  Wt 185 lb 2 oz (83.972 kg)  BMI 25.10 kg/m2  SpO2 95% General:   Well developed, well nourished . NAD.  HEENT:  Normocephalic . Face symmetric, atraumatic. TMs normal, nose not congested, sinuses not TTP Lungs:  Rhonchi bilaterally, worse with cough, prolonged expiratory time. Normal respiratory effort, no intercostal retractions, no accessory muscle use. Heart: RRR,  no murmur.  No pretibial edema bilaterally  Skin: Not pale. Not jaundice Neurologic:  alert & oriented X3.  Speech normal, gait appropriate for age and unassisted Psych--  Cognition and judgment appear intact.  Cooperative with normal attention span and concentration.  Behavior appropriate. No anxious or depressed appearing.      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 Insomnia Hyponatremia DX 2014,  admitted  PLAN: Community-acquired pneumonia: Status post antibiotics. Still coughing. Check a chest x-ray, CBC, CMP. Cough, reactive airway disease:   no h/o asthma but ongoing cough, + large airway congestion and b-spasm by history. Plan: rx steroids, cont symbicort, add alb (as rescue for persistent cough), refer to pulmonary RTC  3 months  multiple questions answer

## 2015-12-24 ENCOUNTER — Telehealth: Payer: Self-pay | Admitting: Internal Medicine

## 2015-12-24 DIAGNOSIS — E871 Hypo-osmolality and hyponatremia: Secondary | ICD-10-CM

## 2015-12-24 NOTE — Telephone Encounter (Signed)
Notes Recorded by Colon Branch, MD on 12/07/2015 at 5:18 PM Releasing results to [place] with comments: Bill, your sodium is slightly low and needs to be recheck in few months. The blood count is normal. Overall good results.

## 2015-12-24 NOTE — Telephone Encounter (Signed)
Pt wife is very concerned and scared about recent labs and states she needs a call urgently.
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
## 2015-12-24 NOTE — Telephone Encounter (Addendum)
Wife very concern about husband's sodium level.  Would like sodium level recheck before his next scheduled appt.    Plan per  Dr. Larose Kells: BMP next week Refer to Nephrology ASAP  Plan discussed with Wife.  Wife stated understanding and agreed with plan.  Lab appt scheduled.  Future lab ordered.  Nephrology refer placed and discussed with referral coordinator.

## 2015-12-28 ENCOUNTER — Ambulatory Visit (INDEPENDENT_AMBULATORY_CARE_PROVIDER_SITE_OTHER): Payer: Medicare Other | Admitting: Internal Medicine

## 2015-12-28 ENCOUNTER — Encounter: Payer: Self-pay | Admitting: Internal Medicine

## 2015-12-28 VITALS — BP 140/82 | HR 78 | Ht 72.0 in | Wt 189.0 lb

## 2015-12-28 DIAGNOSIS — J189 Pneumonia, unspecified organism: Secondary | ICD-10-CM | POA: Diagnosis not present

## 2015-12-28 DIAGNOSIS — R05 Cough: Secondary | ICD-10-CM

## 2015-12-28 DIAGNOSIS — R059 Cough, unspecified: Secondary | ICD-10-CM

## 2015-12-28 NOTE — Progress Notes (Signed)
Subjective:     Patient ID: Bobby [name], male   DOB: 1951-07-21      MRN: [id number]  HPI  40 yowm never smoker acutely ill with cough mid Dec 2016  which persisted after exp to wife with flu like symptoms then dx'd pneumonia and better p rx with abx starting with amox/ zpak then doxy still coughing then so rx with Prednisone and resolved referred to pulmonary clinic 12/28/2015 by Dr Larose Kells.   12/28/2015 1st  Pulmonary office visit/ Cornelia Walraven   Chief Complaint  Patient presents with  . Pulmonary Consult    Referred by Dr. Larose Kells. Pt had PNA 11/11/15- txed with Doxy. His breathing is now back to normal baseline. He has not been bothered by cough in the past 2 wks.   Not limited by breathing from desired activities    No obvious day to day or daytime variability or assoc chronic cough or cp or chest tightness, subjective wheeze or overt sinus or hb symptoms. No unusual exp hx or h/o childhood pna/ asthma or knowledge of premature birth.  Sleeping ok without nocturnal  or early am exacerbation  of respiratory  c/o's or need for noct saba. Also denies any obvious fluctuation of symptoms with weather or environmental changes or other aggravating or alleviating factors except as outlined above   Current Medications, Allergies, Complete Past Medical History, Past Surgical History, Family History, and Social History were reviewed in [employer] record.  ROS  The following are not active complaints unless bolded sore throat, dysphagia, dental problems, itching, sneezing,  nasal congestion or excess/ purulent secretions, ear ache,   fever, chills, sweats, unintended wt loss, classically pleuritic or exertional cp, hemoptysis,  orthopnea pnd or leg swelling, presyncope, palpitations, abdominal pain, anorexia, nausea, vomiting, diarrhea  or change in bowel or bladder habits, change in stools or urine, dysuria,hematuria,  rash, arthralgias, visual complaints, headache, numbness,  weakness or ataxia or problems with walking or coordination,  change in mood/affect or memory.           Review of Systems     Objective:   Physical Exam    amb wm nad  Wt Readings from Last 3 Encounters:  12/28/15 189 lb (85.73 kg)  12/07/15 185 lb 2 oz (83.972 kg)  11/11/15 191 lb 2 oz (86.694 kg)    Vital signs reviewed   HEENT: nl dentition, turbinates, and oropharynx. Nl external ear canals without cough reflex   NECK :  without JVD/Nodes/TM/ nl carotid upstrokes bilaterally   LUNGS: no acc muscle use,  Nl contour chest which is clear to A and P bilaterally without cough on insp or exp maneuvers   CV:  RRR  no s3 or murmur or increase in P2, no edema   ABD:  soft and nontender with nl inspiratory excursion in the supine position. No bruits or organomegaly, bowel sounds nl  MS:  Nl gait/ ext warm without deformities, calf tenderness, cyanosis or clubbing No obvious joint restrictions   SKIN: warm and dry without lesions    NEURO:  alert, approp, nl sensorium with  no motor deficits     I personally reviewed images and agree with radiology impression as follows:  CXR:  12/06/14 Significant interval improvement of previously described right lower lobe pneumonia with minimal linear opacities persisting.  Assessment:

## 2015-12-28 NOTE — Patient Instructions (Signed)
In the event the cough returns I would try Try prilosec otc 20mg   Take 30-60 min before first meal of the day and Pepcid ac (famotidine) 20 mg one @  bedtime until cough is completely gone for at least a week without the need for cough suppression  Return if cough recurs

## 2015-12-29 ENCOUNTER — Other Ambulatory Visit (INDEPENDENT_AMBULATORY_CARE_PROVIDER_SITE_OTHER): Payer: Medicare Other

## 2015-12-29 DIAGNOSIS — E871 Hypo-osmolality and hyponatremia: Secondary | ICD-10-CM | POA: Diagnosis not present

## 2015-12-29 LAB — BASIC METABOLIC PANEL
BUN: 16 mg/dL (ref 6–23)
CO2: 26 meq/L (ref 19–32)
Calcium: 8.9 mg/dL (ref 8.4–10.5)
Chloride: 103 mEq/L (ref 96–112)
Creatinine, Ser: 0.94 mg/dL (ref 0.40–1.50)
GFR: 85.15 mL/min (ref >60.00)
Glucose, Bld: 88 mg/dL (ref 70–99)
Potassium: 4.3 mEq/L (ref 3.5–5.1)
Sodium: 138 mEq/L (ref 135–145)

## 2016-01-01 ENCOUNTER — Encounter: Payer: Self-pay | Admitting: Internal Medicine

## 2016-01-01 DIAGNOSIS — J189 Pneumonia, unspecified organism: Secondary | ICD-10-CM | POA: Insufficient documentation

## 2016-01-01 DIAGNOSIS — R05 Cough: Secondary | ICD-10-CM | POA: Insufficient documentation

## 2016-01-01 DIAGNOSIS — R059 Cough, unspecified: Secondary | ICD-10-CM | POA: Insufficient documentation

## 2016-01-01 NOTE — Assessment & Plan Note (Signed)
Resolved on cxr 12/07/15 >  In this never smoker there is no need for dedicated f/u for this problem

## 2016-01-01 NOTE — Assessment & Plan Note (Addendum)
He is most concerned re what he can do to keep the cough from coming back.  Of course we need to see him if he gets another pna but I assured him that was [place]; however, what is almost certain is that he will pick up viral uri's over his life and I did give him general guidlelines on how to treat them with otcs including acid suppression should the cough become violent/ refractory to otcs or chronic = over 3 weeks  Explained the natural history of uri and why it's necessary in patients at risk to treat GERD aggressively - at least  short term -   to reduce risk of evolving cyclical cough initially  triggered by epithelial injury and a heightened sensitivty to the effects of any upper airway irritants,  most importantly acid - related - then perpetuated by epithelial injury related to the cough itself as the upper airway collapses on itself.  That is, the more sensitive the epithelium becomes once it is damaged by the virus, the more the ensuing irritability> the more the cough, the more the secondary reflux (especially in those prone to reflux) the more the irritation of the sensitive mucosa and so on in a  Classic cyclical pattern.    Total time devoted to counseling  = 25/15m review case with pt/ discussion of options/alternatives/ personally creating in presence of pt  then going over specific  Instructions directly with the pt including how to use all of the meds but in particular covering each new medication in detail (see avs)      Pulmonary f/u can be prn

## 2016-01-03 DIAGNOSIS — L570 Actinic keratosis: Secondary | ICD-10-CM | POA: Diagnosis not present

## 2016-01-03 DIAGNOSIS — L218 Other seborrheic dermatitis: Secondary | ICD-10-CM | POA: Diagnosis not present

## 2016-01-03 DIAGNOSIS — L812 Freckles: Secondary | ICD-10-CM | POA: Diagnosis not present

## 2016-01-03 DIAGNOSIS — Z85828 Personal history of other malignant neoplasm of skin: Secondary | ICD-10-CM | POA: Diagnosis not present

## 2016-01-03 DIAGNOSIS — L821 Other seborrheic keratosis: Secondary | ICD-10-CM | POA: Diagnosis not present

## 2016-01-11 ENCOUNTER — Ambulatory Visit: Payer: Medicare Other | Admitting: Internal Medicine

## 2016-01-12 DIAGNOSIS — I10 Essential (primary) hypertension: Secondary | ICD-10-CM | POA: Diagnosis not present

## 2016-01-12 DIAGNOSIS — E871 Hypo-osmolality and hyponatremia: Secondary | ICD-10-CM | POA: Diagnosis not present

## 2016-01-12 LAB — BASIC METABOLIC PANEL
BUN: 20 mg/dL (ref 4–21)
Creatinine: 1 mg/dL (ref 0.6–1.3)
Glucose: 97 mg/dL
Potassium: 5.2 mmol/L (ref 3.4–5.3)
SODIUM: 137 mmol/L (ref 137–147)

## 2016-01-13 ENCOUNTER — Ambulatory Visit: Payer: BLUE CROSS/BLUE SHIELD | Admitting: Internal Medicine

## 2016-01-17 ENCOUNTER — Encounter: Payer: Self-pay | Admitting: Internal Medicine

## 2016-01-18 ENCOUNTER — Other Ambulatory Visit: Payer: Self-pay | Admitting: *Deleted

## 2016-01-18 MED ORDER — TESTOSTERONE 30 MG/ACT TD SOLN: TRANSDERMAL | Status: DC

## 2016-03-08 ENCOUNTER — Ambulatory Visit (INDEPENDENT_AMBULATORY_CARE_PROVIDER_SITE_OTHER): Payer: Medicare Other | Admitting: Internal Medicine

## 2016-03-08 ENCOUNTER — Encounter: Payer: Self-pay | Admitting: Internal Medicine

## 2016-03-08 VITALS — BP 120/84 | HR 55 | Temp 98.0°F | Ht 72.0 in | Wt 187.4 lb

## 2016-03-08 DIAGNOSIS — Z09 Encounter for follow-up examination after completed treatment for conditions other than malignant neoplasm: Secondary | ICD-10-CM

## 2016-03-08 DIAGNOSIS — Z125 Encounter for screening for malignant neoplasm of prostate: Secondary | ICD-10-CM

## 2016-03-08 DIAGNOSIS — E871 Hypo-osmolality and hyponatremia: Secondary | ICD-10-CM | POA: Diagnosis not present

## 2016-03-08 DIAGNOSIS — I10 Essential (primary) hypertension: Secondary | ICD-10-CM

## 2016-03-08 DIAGNOSIS — F411 Generalized anxiety disorder: Secondary | ICD-10-CM | POA: Diagnosis not present

## 2016-03-08 DIAGNOSIS — M4120 Other idiopathic scoliosis, site unspecified: Secondary | ICD-10-CM

## 2016-03-08 DIAGNOSIS — Z79899 Other long term (current) drug therapy: Secondary | ICD-10-CM | POA: Diagnosis not present

## 2016-03-08 LAB — PSA, MEDICARE: PSA: 2.34 ng/mL (ref 0.10–4.00)

## 2016-03-08 NOTE — Progress Notes (Signed)
Subjective:    Patient ID: Bobby [name], male    DOB: [birth year], 65 y.o.   MRN: [id number]  DOS:  03/08/2016 Type of visit - description : Routine office visit Interval history: Pneumonia, had a chest x-ray, much improved. Had cough, saw pulmonary, note reviewed. Doing better Hyponatremia: Note from renal reviewed. Anxiety depression and insomnia: Continue with a lot of stress at work, currently on Ambien and clonazepam. Due for contract and UDS Hypogonadism: Saw endocrinology few months ago, note reviewed. Reports good compliance   Review of Systems Denies chest pain or difficulty breathing No nausea, vomiting, diarrhea. Libido back to normal according to the patient. No dysuria, gross hematuria difficulty urinating    Past Medical History  Diagnosis Date  . Hypertension   . Insomnia   . Anxiety and depression   . Hyponatremia 03-2013    ongoing, seeing Dr. Posey Pronto  . 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 Posey Pronto    Past Surgical History  Procedure Laterality Date  . Shoulder surgery  1980s    right shoulder    Social History   Social History  . Marital Status: Married    Spouse Name: N/A  . Number of Children: 2  . Years of Education: N/A   Occupational History  . bussines owner     Social History Main Topics  . Smoking status: Never [name]   . Smokeless tobacco: Never Used  . Alcohol Use: 0.0 oz/week    0 Standard drinks or equivalent per week     Comment: socially , 2 a day  . Drug Use: No  . Sexual Activity: Not on file   Other Topics Concern  . Not on file   Social History Narrative   [name], Married, 2 children   mom passed away 05-14-2008                        Medication List       This list is accurate as of: 03/08/16  6:00 PM.  Always use your most recent med list.               carvedilol 6.25 MG tablet  Commonly known as:  COREG  Take 1 tablet (6.25 mg total) by mouth 2 (two) times daily.     clonazePAM 0.5 MG tablet  Commonly known as:  KLONOPIN  Take 1 tablet (0.5 mg total) by mouth 2 (two) times daily as needed for anxiety.     folic acid 1 MG tablet  Commonly known as:  FOLVITE  Take 1 tablet (1 mg total) by mouth daily.     multivitamin with minerals Tabs tablet  Take 1 tablet by mouth daily.     Testosterone 30 MG/ACT Soln  Commonly known as:  AXIRON  Use one application under each arm daily     thiamine 100 MG tablet  Take 1 tablet (100 mg total) by mouth daily.     zolpidem 10 MG tablet  Commonly known as:  AMBIEN  Take 1 tablet (10 mg total) by mouth at bedtime as needed for sleep.           Objective:   Physical Exam BP 120/84 mmHg  Pulse 55  Temp(Src) 98 F (36.7 C) (Oral)  Ht 6' (1.829 m)  Wt 187 lb 6.4 oz (85.004 kg)  BMI 25.41 kg/m2  SpO2 98% General:   Well developed, well nourished . NAD.  HEENT:  Normocephalic . Face symmetric, atraumatic Lungs:  CTA B Normal respiratory effort, no intercostal retractions, no accessory muscle use. Heart: RRR,  no murmur.  No pretibial edema bilaterally  Rectal:  External abnormalities: none. Normal sphincter tone. No rectal masses or tenderness.  No stools found Prostate: Prostate gland firm and smooth, slight enlargement?, no  nodularity, tenderness, mass, asymmetry or induration.  Skin: Not pale. Not jaundice Neurologic:  alert & oriented X3.  Speech normal, gait appropriate for age and unassisted Psych--  Cognition and judgment appear intact.  Cooperative with normal attention span and concentration.  Behavior appropriate. No anxious or depressed appearing.      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 Hyponatremia onset 2014, admitted. 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 Melvyn Novas, currently asymptomatic HTN: Well-controlled Anxiety depression insomnia: Continue with the stress at work, counseled. Check a UDS and contract. Hypogonadism: On HRT, DRE slt increased prostate size?, check a PSA. Recommend to see endocrinology routinely.next OV schedule for 04/2016.  Decreased libido: At some point that was a problem, better apparently from HRT? hyponatremia: Since the last office visit, Na+ normalized saw renal, diagnosis SIADH, was recommended to moderate fluid intake and watch for symptoms such as mental status changes or gait disorder --->  recommendations were reviewed with the patient today RTC around 7 or 8 , 2017, CPX fasting

## 2016-03-08 NOTE — Patient Instructions (Addendum)
GO TO THE LAB :      Get the blood work     GO TO THE FRONT DESK Schedule your next appointment for a  yearly checkup by July-August  2017,  Fasting

## 2016-03-08 NOTE — Progress Notes (Signed)
Pre visit review using our clinic review tool, if applicable. No additional management support is needed unless otherwise documented below in the visit note. 

## 2016-03-08 NOTE — Assessment & Plan Note (Signed)
Community-acquired pneumonia: F/u CXR improved, no further eval needed Cough: Saw Dr. Melvyn Novas, currently asymptomatic HTN: Well-controlled Anxiety depression insomnia: Continue with the stress at work, counseled. Check a UDS and contract. Hypogonadism: On HRT, DRE slt increased prostate size?, check a PSA. Recommend to see endocrinology routinely.next OV schedule for 04/2016.  Decreased libido: At some point that was a problem, better apparently from HRT? hyponatremia: Since the last office visit, Na+ normalized saw renal, diagnosis SIADH, was recommended to moderate fluid intake and watch for symptoms such as mental status changes or gait disorder --->  recommendations were reviewed with the patient today RTC around 7 or 8 , 2017, CPX fasting

## 2016-05-01 ENCOUNTER — Ambulatory Visit (INDEPENDENT_AMBULATORY_CARE_PROVIDER_SITE_OTHER): Payer: Medicare Other | Admitting: Endocrinology

## 2016-05-01 ENCOUNTER — Encounter: Payer: Self-pay | Admitting: Endocrinology

## 2016-05-01 VITALS — BP 138/84 | HR 65 | Ht 72.0 in | Wt 188.0 lb

## 2016-05-01 DIAGNOSIS — E291 Testicular hypofunction: Secondary | ICD-10-CM

## 2016-05-01 DIAGNOSIS — R5383 Other fatigue: Secondary | ICD-10-CM | POA: Diagnosis not present

## 2016-05-01 LAB — CBC
HCT: 40.7 % (ref 39.0–52.0)
HEMOGLOBIN: 13.7 g/dL (ref 13.0–17.0)
MCHC: 33.5 g/dL (ref 30.0–36.0)
MCV: 94.5 fl (ref 78.0–100.0)
Platelets: 249 10*3/uL (ref 150.0–400.0)
RBC: 4.31 Mil/uL (ref 4.22–5.81)
RDW: 13 % (ref 11.5–15.5)
WBC: 8.1 10*3/uL (ref 4.0–10.5)

## 2016-05-01 LAB — TESTOSTERONE: Testosterone: 514.76 ng/dL (ref 300.00–890.00)

## 2016-05-01 NOTE — Progress Notes (Signed)
Patient ID: Bobby [name], male   DOB: 10-08-1951, 65 y.o.   MRN: [id number]          Chief complaint: Low testosterone, follow-up  History of Present Illness  The patient was found to have hypogonadism when he was having symptoms of decreased libido, decreased motivation but no significant fatigue.  He was also concerned because of family History of hypogonadism and his brother The exam showed left testicle 3-3.5 cm and right testicle 3 cm.Both testicles are relatively soft   Prior lab results show baselinetestosterone level of 307, done twice in 5/16 Free testosterone was slightly low on the first test but low normal at 6.9 on the second test  He was given the trial of testosterone supplementation starting in 7/16 with Axiron which is the preferred drug on his plan He is applying one pump under each arm   Now he says that he forgets his Axiron about every other day as he is in a hurry in the morning He has taken it regularly the last 3 days  At present he says he feels fairly good with his energy level, motivation and libido   Lab Results  Component Value Date   TESTOSTERONE 492 10/26/2015    Previous labs: Prolactin level: 7.8 LH level: 3.9       Medication List       This list is accurate as of: 05/01/16  9:00 AM.  Always use your most recent med list.               carvedilol 6.25 MG tablet  Commonly known as:  COREG  Take 1 tablet (6.25 mg total) by mouth 2 (two) times daily.     clonazePAM 0.5 MG tablet  Commonly known as:  KLONOPIN  Take 1 tablet (0.5 mg total) by mouth 2 (two) times daily as needed for anxiety.     folic acid 1 MG tablet  Commonly known as:  FOLVITE  Take 1 tablet (1 mg total) by mouth daily.     multivitamin with minerals Tabs tablet  Take 1 tablet by mouth daily.     Testosterone 30 MG/ACT Soln  Commonly known as:  AXIRON  Use one application under each arm daily     thiamine 100 MG tablet  Take 1 tablet (100 mg  total) by mouth daily.     zolpidem 10 MG tablet  Commonly known as:  AMBIEN  Take 1 tablet (10 mg total) by mouth at bedtime as needed for sleep.        Allergies:  Allergies  Allergen Reactions  . Tramadol     REACTION: hives    Past Medical History  Diagnosis Date  . Hypertension   . Insomnia   . Anxiety and depression   . Hyponatremia 03-2013    ongoing, seeing Dr. Posey Pronto  . SIADH (syndrome of inappropriate ADH production) ([place]) 2017    Dr. Posey Pronto    Past Surgical History  Procedure Laterality Date  . Shoulder surgery  1980s    right shoulder    Family History  Problem Relation Age of Onset  . Hypertension Mother   . Stroke Mother   . Diabetes Mother   . Colon cancer Neg Hx   . Prostate cancer Neg Hx   . CAD Neg Hx   . Emphysema Mother     smoked    Social History:  reports that he has never smoked. He has never used smokeless tobacco. He reports that  he drinks alcohol. He reports that he does not use illicit drugs.  ROS He has had a weight gain over the last 2 years of about  20 lbs but this is stable now Does not really exercise  Wt Readings from Last 3 Encounters:  05/01/16 188 lb (85.276 kg)  03/08/16 187 lb 6.4 oz (85.004 kg)  12/28/15 189 lb (85.73 kg)   PSA slightly higher on his last measurement, to be repeated in 6 months  General Examination:   BP 138/84 mmHg  Pulse 65  Ht 6' (1.829 m)  Wt 188 lb (85.276 kg)  BMI 25.49 kg/m2  SpO2 97%   Assessment/ Plan:   Hypogonadotropic hypogonadism , mild  Subjectively he feels  Fairly good again with more motivation and energy as well as improved libido with testosterone supplementation   This is surprising since he is now not is compliant with his therapy and may forget his dosage   Recently has taken his  Axiron at least for 3 days in a row and will need to check his testosterone level to see where do levels are  He desires to continue supplementation   Will check his CBC again  PSA being  monitored by PCP  Also given the option of continuing follow-up with PCP  [facility] 05/01/2016, 9:00 AM

## 2016-05-01 NOTE — Progress Notes (Signed)
Quick Note:  Please let patient know that the testosterone level is good. If he is able to take the Axiron gel every morning after his shower regularly he can reduce the dose to 1 pump instead of 2, may follow up with PCP ______

## 2016-05-14 ENCOUNTER — Other Ambulatory Visit: Payer: Self-pay | Admitting: Internal Medicine

## 2016-05-15 NOTE — Telephone Encounter (Signed)
Pt is requesting refill on Ambien.  Last OV: 03/08/2016 Last Fill: 11/11/2015 #90 and 1RF UDS: Not needed  Please advise.

## 2016-05-15 NOTE — Telephone Encounter (Signed)
Rx printed, awaiting MD signature.  

## 2016-05-15 NOTE — Telephone Encounter (Signed)
Rx faxed to [facility].  

## 2016-05-15 NOTE — Telephone Encounter (Signed)
Okay #90 and 1 refill 

## 2016-05-17 ENCOUNTER — Encounter: Payer: Self-pay | Admitting: Internal Medicine

## 2016-05-23 ENCOUNTER — Other Ambulatory Visit: Payer: Self-pay | Admitting: Internal Medicine

## 2016-06-21 ENCOUNTER — Ambulatory Visit (INDEPENDENT_AMBULATORY_CARE_PROVIDER_SITE_OTHER): Payer: Medicare Other | Admitting: Internal Medicine

## 2016-06-21 ENCOUNTER — Encounter: Payer: Self-pay | Admitting: Internal Medicine

## 2016-06-21 VITALS — BP 122/62 | HR 69 | Temp 98.2°F | Resp 12 | Ht 72.0 in | Wt 188.5 lb

## 2016-06-21 DIAGNOSIS — R972 Elevated prostate specific antigen [PSA]: Secondary | ICD-10-CM | POA: Diagnosis not present

## 2016-06-21 DIAGNOSIS — Z0000 Encounter for general adult medical examination without abnormal findings: Secondary | ICD-10-CM

## 2016-06-21 DIAGNOSIS — E871 Hypo-osmolality and hyponatremia: Secondary | ICD-10-CM

## 2016-06-21 DIAGNOSIS — G4700 Insomnia, unspecified: Secondary | ICD-10-CM | POA: Diagnosis not present

## 2016-06-21 DIAGNOSIS — I10 Essential (primary) hypertension: Secondary | ICD-10-CM | POA: Diagnosis not present

## 2016-06-21 DIAGNOSIS — F411 Generalized anxiety disorder: Secondary | ICD-10-CM | POA: Diagnosis not present

## 2016-06-21 NOTE — Progress Notes (Signed)
Subjective:    Patient ID: Bobby [name], male    DOB: Nov 24, 1950, 65 y.o.   MRN: [id number]  DOS:  06/21/2016 Type of visit - description :  Interval history:  Here for Medicare AWV: 1. Risk factors based on Past M, S, F history: reviewed 2. Physical Activities:  Bike riding  x 2-3/week 3. Depression/mood: neg screening, on meds    4. Hearing:  No problemss noted or reported   5. ADL's:  Works FT, independent , less stress this year 6. Fall Risk: no recent falls, discussed prevention 7. home Safety: does feel safe at home   8. Height, weight, & visual acuity: see VS, sees eye doctor q year 9. Counseling: provided 10. Labs ordered based on risk factors: if needed   11. Referral Coordination: if needed 12. Care Plan, see assessment and plan  , written instructions provided  13. Cognitive Assessment: cognition and motor skills wnl 14. Care team updated  15. End of life care discussed , has a healthcare POA   In addition, today we discussed the following: HTN: Good med compliance, no apparent side effects. Anxiety depression and insomnia: Currently well controlled Hyponatremia: We are rechecking labs today, was recommended to follow up once yearly with renal. Hypogonadism: Good compliance with HRT   Review of Systems Constitutional: No fever. No chills. No unexplained wt changes. No unusual sweats  HEENT: No dental problems, no ear discharge, no facial swelling, no voice changes. No eye discharge, no eye  redness , no  intolerance to light   Respiratory: No wheezing , no  difficulty breathing. No cough , no mucus production  Cardiovascular: No CP, no leg swelling , no  Palpitations  GI: no nausea, no vomiting, no diarrhea , no  abdominal pain.  No blood in the stools. No dysphagia, no odynophagia    Endocrine: No polyphagia, no polyuria , no polydipsia  GU: No dysuria, gross hematuria, difficulty urinating. No urinary urgency, no frequency.  Musculoskeletal: No  joint swellings or unusual aches or pains  Skin: No change in the color of the skin, palor , no  Rash  Allergic, immunologic: No environmental allergies , no  food allergies  Neurological: No dizziness no  syncope. No headaches. No diplopia, no slurred, no slurred speech, no motor deficits, no facial  Numbness  Hematological: No enlarged lymph nodes, no easy bruising , no unusual bleedings  Psychiatry: No suicidal ideas, no hallucinations, no beavior problems, no confusion.  No unusual/severe anxiety, no depression   Past Medical History:  Diagnosis Date  . Anxiety and depression   . Hypertension   . Hyponatremia 03-2013   ongoing, seeing Dr. Posey [name]  . Insomnia   . SIADH (syndrome of inappropriate ADH production) ([place]) 2017   Dr. Posey [name]    Past Surgical History:  Procedure Laterality Date  . SHOULDER SURGERY  1980s   right shoulder    Social History   Social History  . Marital status: Married    Spouse name: N/A  . Number of children: 2  . 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 Smoking status: Never Smoker  . Smokeless tobacco: Never Used  . Alcohol use 0.0 oz/week     Comment: socially , 2 to 3 drinks  a day  . Drug use: No  . Sexual activity: Not on file   Other Topics Concern  . Not on file  Social History Narrative   "Bobby [name]", Married,    2 children   mom passed away 2008/04/23                     Family History  Problem Relation Age of Onset  . Hypertension Mother   . Stroke Mother   . Diabetes Mother   . Emphysema Mother     smoked  . Colon cancer Neg Hx   . Prostate cancer Neg Hx   . CAD Neg Hx        Medication List       Accurate as of 06/21/16  5:37 PM. Always use your most recent med list.          carvedilol 6.25 MG tablet Commonly known as:  COREG Take 1 tablet (6.25 mg total) by mouth 2 (two) times daily.   clonazePAM 0.5 MG tablet Commonly known as:   KLONOPIN Take 1 tablet (0.5 mg total) by mouth 2 (two) times daily as needed for anxiety.   folic acid 1 MG tablet Commonly known as:  FOLVITE Take 1 tablet (1 mg total) by mouth daily.   multivitamin with minerals Tabs tablet Take 1 tablet by mouth daily.   Testosterone 30 MG/ACT Soln Commonly known as:  AXIRON Use one application under each arm daily   thiamine 100 MG tablet Take 1 tablet (100 mg total) by mouth daily.   zolpidem 10 MG tablet Commonly known as:  AMBIEN Take 1 tablet (10 mg total) by mouth at bedtime as needed for sleep.          Objective:   Physical Exam BP 122/62 (BP Location: Left Arm, Patient Position: Sitting, Cuff Size: Normal)   Pulse 69   Temp 98.2 F (36.8 C) (Oral)   Resp 12   Ht 6' (1.829 m)   Wt 188 lb 8 oz (85.5 kg)   SpO2 97%   BMI 25.57 kg/m   General:   Well developed, well nourished . NAD.  Neck: No  thyromegaly  HEENT:  Normocephalic . Face symmetric, atraumatic Lungs:  CTA B Normal respiratory effort, no intercostal retractions, no accessory muscle use. Heart: RRR,  no murmur.  No pretibial edema bilaterally  Abdomen:  Not distended, soft, non-tender. No rebound or rigidity.   Skin: Exposed areas without rash. Not pale. Not jaundice Neurologic:  alert & oriented X3.  Speech normal, gait appropriate for age and unassisted Strength symmetric and appropriate for age.  Psych: Cognition and judgment appear intact.  Cooperative with normal attention span and concentration.  Behavior appropriate. No anxious or depressed appearing.    Assessment & Plan:   Assessment> HTN Anxiety, depression, insomnia : used lexapro, d/c (decreased libido) Daily etoh use  Hypogonadotrophic Hypogonadism -- saw Dr Bobby [name] 05-2015, rx HRT Memory problems? Hyponatremia onset 2014, admitted. 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 Daily alcohol use:  Rec not to exceed 2 drinks daily (if he must). Hypogonadism: Dr Bobby [name] gave him the option to follow-up with PCP, I recommend to continue seeing Dr. Dwyane [name] Hyponatremia: Checking BMP today, next visit with renal 12/2016. Recommend to call and set an appointment. Cough: Resolved RTC 6-8 months

## 2016-06-21 NOTE — Assessment & Plan Note (Addendum)
Td 2010;  zostavax 2014 ;  PNM shot --2015;  prevnar--2016   had a Cscope aprox 2007, Dr Watt Climes  ---->  repeated Cscope 08-2009 ---->  Tubular adenoma per bx report------> colonoscopy 09-2015, no polyps, 5 years   DRE 02-2016 shows slightly increased prostate size, PSA velocity was slightly elevated. Recheck a PSA  Diet-exercise discussed  DEXA was rec before  but never done

## 2016-06-21 NOTE — Assessment & Plan Note (Signed)
HTN: Continue carvedilol, check a BMP Anxiety  depression and insomnia: Currently only on clonazepam/Ambien, seems to be doing well. Daily alcohol use:  Rec not to exceed 2 drinks daily (if he must). Hypogonadism: Dr Dwyane Dee gave him the option to follow-up with PCP, I recommend to continue seeing Dr. Dwyane Dee Hyponatremia: Checking BMP today, next visit with renal 12/2016. Recommend to call and set an appointment. Cough: Resolved RTC 6-8 months

## 2016-06-21 NOTE — Patient Instructions (Signed)
Get your blood work before you leave    Please call Dr. Dwyane Dee -endocrinology- for a follow-up regards your testosterone   You are due to see the nephrologist for your decreased sodium by 2- 2018. Please make an appointment  Next visit with me 6-8 months.  Fall Prevention and Home Safety Falls cause injuries and can affect all age groups. It is possible to use preventive measures to significantly decrease the likelihood of falls. There are many simple measures which can make your home safer and prevent falls. OUTDOORS  Repair cracks and edges of walkways and driveways.  Remove high doorway thresholds.  Trim shrubbery on the main path into your home.  Have good outside lighting.  Clear walkways of tools, rocks, debris, and clutter.  Check that handrails are not broken and are securely fastened. Both sides of steps should have handrails.  Have leaves, snow, and ice cleared regularly.  Use sand or salt on walkways during winter months.  In the garage, clean up grease or oil spills. BATHROOM  Install night lights.  Install grab bars by the toilet and in the tub and shower.  Use non-skid mats or decals in the tub or shower.  Place a plastic non-slip stool in the shower to sit on, if needed.  Keep floors dry and clean up all water on the floor immediately.  Remove soap buildup in the tub or shower on a regular basis.  Secure bath mats with non-slip, double-sided rug tape.  Remove throw rugs and tripping hazards from the floors. BEDROOMS  Install night lights.  Make sure a bedside light is easy to reach.  Do not use oversized bedding.  Keep a telephone by your bedside.  Have a firm chair with side arms to use for getting dressed.  Remove throw rugs and tripping hazards from the floor. KITCHEN  Keep handles on pots and pans turned toward the center of the stove. Use back burners when possible.  Clean up spills quickly and allow time for drying.  Avoid walking  on wet floors.  Avoid hot utensils and knives.  Position shelves so they are not too high or low.  Place commonly used objects within easy reach.  If necessary, use a sturdy step stool with a grab bar when reaching.  Keep electrical cables out of the way.  Do not use floor polish or wax that makes floors slippery. If you must use wax, use non-skid floor wax.  Remove throw rugs and tripping hazards from the floor. STAIRWAYS  Never leave objects on stairs.  Place handrails on both sides of stairways and use them. Fix any loose handrails. Make sure handrails on both sides of the stairways are as long as the stairs.  Check carpeting to make sure it is firmly attached along stairs. Make repairs to worn or loose carpet promptly.  Avoid placing throw rugs at the top or bottom of stairways, or properly secure the rug with carpet tape to prevent slippage. Get rid of throw rugs, if possible.  Have an electrician put in a light switch at the top and bottom of the stairs. OTHER FALL PREVENTION TIPS  Wear low-heel or rubber-soled shoes that are supportive and fit well. Wear closed toe shoes.  When using a stepladder, make sure it is fully opened and both spreaders are firmly locked. Do not climb a closed stepladder.  Add color or contrast paint or tape to grab bars and handrails in your home. Place contrasting color strips on first and  last steps.  Learn and use mobility aids as needed. Install an electrical emergency response system.  Turn on lights to avoid dark areas. Replace light bulbs that burn out immediately. Get light switches that glow.  Arrange furniture to create clear pathways. Keep furniture in the same place.  Firmly attach carpet with non-skid or double-sided tape.  Eliminate uneven floor surfaces.  Select a carpet pattern that does not visually hide the edge of steps.  Be aware of all pets. OTHER HOME SAFETY TIPS  Set the water temperature for 120 F (48.8  C).  Keep emergency numbers on or near the telephone.  Keep smoke detectors on every level of the home and near sleeping areas. Document Released: 10/27/2002 Document Revised: 05/07/2012 Document Reviewed: 01/26/2012 [facility] Patient Information 2015 [place], [place]. This information is not intended to replace advice given to you by your health care provider. Make sure you discuss any questions you have with your health care provider.   Preventive Care for Adults Ages 43 and over  Blood pressure check.** / Every 1 to 2 years.  Lipid and cholesterol check.**/ Every 5 years beginning at age 57.  Lung cancer screening. / Every year if you are aged 71-80 years and have a 30-pack-year history of smoking and currently smoke or have quit within the past 15 years. Yearly screening is stopped once you have quit smoking for at least 15 years or develop a health problem that would prevent you from having lung cancer treatment.  Fecal occult blood test (FOBT) of stool. / Every year beginning at age 80 and continuing until age 64. You may not have to do this test if you get a colonoscopy every 10 years.  Flexible sigmoidoscopy** or colonoscopy.** / Every 5 years for a flexible sigmoidoscopy or every 10 years for a colonoscopy beginning at age 53 and continuing until age 65.  Hepatitis C blood test.** / For all people born from [birth year] through 1967 and any individual with known risks for hepatitis C.  Abdominal aortic aneurysm (AAA) screening.** / A one-time screening for ages 103 to 47 years who are current or former smokers.  Skin self-exam. / Monthly.  Influenza vaccine. / Every year.  Tetanus, diphtheria, and acellular pertussis (Tdap/Td) vaccine.** / 1 dose of Td every 10 years.  Varicella vaccine.** / Consult your health care provider.  Zoster vaccine.** / 1 dose for adults aged 19 years or older.  Pneumococcal 13-valent conjugate (PCV13) vaccine.** / Consult your health care  provider.  Pneumococcal polysaccharide (PPSV23) vaccine.** / 1 dose for all adults aged 64 years and older.  Meningococcal vaccine.** / Consult your health care provider.  Hepatitis A vaccine.** / Consult your health care provider.  Hepatitis B vaccine.** / Consult your health care provider.  Haemophilus influenzae type b (Hib) vaccine.** / Consult your health care provider. **Family history and personal history of risk and conditions may change your health care provider's recommendations. Document Released: 01/02/2002 Document Revised: 11/11/2013 Document Reviewed: 04/03/2011 [facility] Patient Information 2015 [place], [place]. This information is not intended to replace advice given to you by your health care provider. Make sure you discuss any questions you have with your health care provider.

## 2016-06-22 LAB — BASIC METABOLIC PANEL
BUN: 16 mg/dL (ref 6–23)
CHLORIDE: 97 meq/L (ref 96–112)
CO2: 28 meq/L (ref 19–32)
Calcium: 9.3 mg/dL (ref 8.4–10.5)
Creatinine, Ser: 1.05 mg/dL (ref 0.40–1.50)
GFR: 74.84 mL/min (ref >60.00)
Glucose, Bld: 87 mg/dL (ref 70–99)
POTASSIUM: 4.4 meq/L (ref 3.5–5.1)
Sodium: 133 mEq/L — ABNORMAL LOW (ref 135–145)

## 2016-06-22 LAB — PSA: PSA: 2.97 ng/mL (ref 0.10–4.00)

## 2016-06-22 NOTE — Addendum Note (Signed)
Addended byDamita Dunnings D on: 06/22/2016 01:58 PM   Modules accepted: Orders

## 2016-07-12 ENCOUNTER — Telehealth: Payer: Self-pay | Admitting: Endocrinology

## 2016-07-12 NOTE — Telephone Encounter (Signed)
Pharmacy is sending over request for additional info for pt rx, he will be going out of town for 10 days and needs this med by Friday

## 2016-07-13 ENCOUNTER — Other Ambulatory Visit: Payer: Self-pay | Admitting: Internal Medicine

## 2016-07-13 NOTE — Telephone Encounter (Signed)
Rx faxed to [facility].  

## 2016-07-13 NOTE — Telephone Encounter (Signed)
Rx printed, awaiting MD signature.  

## 2016-07-13 NOTE — Telephone Encounter (Signed)
Pt is requesting refill on Clonazepam.  Last OV: 06/21/2016 Last Fill: 11/11/2015 #60 and 2RF UDS: 03/08/2016 Low risk  Please advise.

## 2016-07-13 NOTE — Telephone Encounter (Signed)
Ok 60 and 2 RF

## 2016-07-17 ENCOUNTER — Other Ambulatory Visit: Payer: Self-pay

## 2016-07-17 MED ORDER — TESTOSTERONE 30 MG/ACT TD SOLN: TRANSDERMAL | 2 refills | Status: DC

## 2016-07-17 NOTE — Telephone Encounter (Signed)
Pt is saying that [facility] is trying to let us know the refills for testosterone are ok'd by our office for [facility]

## 2016-07-17 NOTE — Telephone Encounter (Signed)
He was recommended 1 pump a day only on the Axiron prescription if he is regular with taking the medication

## 2016-08-02 ENCOUNTER — Telehealth: Payer: Self-pay | Admitting: Endocrinology

## 2016-08-02 NOTE — Telephone Encounter (Signed)
Pt pharmacy has refills but according to the pt's wife the pharmacy needs a PA for the testosterone, Insurance will not pay until this is completed  Pt's wife is asking for [place] to call the pharmacy for clarification please

## 2016-08-04 ENCOUNTER — Other Ambulatory Visit: Payer: Self-pay | Admitting: *Deleted

## 2016-08-04 MED ORDER — TESTOSTERONE 30 MG/ACT TD SOLN: TRANSDERMAL | 2 refills | Status: DC

## 2016-08-04 NOTE — Telephone Encounter (Signed)
Patient wife is calling on the status of patient PA and medication.. Patient need a refill of medication Testosterone (AXIRON) 30 MG/ACT SOLN.  Lots of confusion about this please advise.

## 2016-08-07 NOTE — Telephone Encounter (Signed)
Contacted pharmacy and was told they needed a PA for the [place], but when I tried to get it the system did not recognize the patient and there is an issue with his insurance

## 2016-09-12 ENCOUNTER — Encounter: Payer: Self-pay | Admitting: Internal Medicine

## 2016-11-06 ENCOUNTER — Ambulatory Visit (INDEPENDENT_AMBULATORY_CARE_PROVIDER_SITE_OTHER): Payer: Medicare Other | Admitting: Endocrinology

## 2016-11-06 ENCOUNTER — Encounter: Payer: Self-pay | Admitting: Endocrinology

## 2016-11-06 VITALS — BP 128/82 | HR 66 | Ht 72.0 in | Wt 192.0 lb

## 2016-11-06 DIAGNOSIS — E291 Testicular hypofunction: Secondary | ICD-10-CM | POA: Diagnosis not present

## 2016-11-06 DIAGNOSIS — Z23 Encounter for immunization: Secondary | ICD-10-CM

## 2016-11-06 LAB — TESTOSTERONE: Testosterone: 403.28 ng/dL (ref 300.00–890.00)

## 2016-11-06 NOTE — Progress Notes (Signed)
Patient ID: Bobby [name], male   DOB: Jul 03, 1951, 65 y.o.   MRN: [id number]          Chief complaint: Low testosterone, follow-up  History of Present Illness  The patient was found to have hypogonadism when he was having symptoms of decreased libido, decreased motivation but no significant fatigue.  He was also concerned because of family History of hypogonadism and his brother The exam showed left testicle 3-3.5 cm and right testicle 3 cm.Both testicles are relatively soft   Prior lab results show baselinetestosterone level of 307, done twice in 5/16 Free testosterone was slightly low on the first test but low normal at 6.9 on the second test  He has been on testosterone supplementation starting in 7/16 with Axiron which is the preferred drug on his plan He is applying one pump under each arm  With testosterone supplementation his libido, motivation and energy has been fairly good  On his last visit he was told to use the Axiron only 1 pump a day since even with not taking his medication daily his level was over 500 He has been more regular with applying this just about every day Again he feels fairly good with his energy level, motivation and libido  He did not go to get his labs done on this visit  Lab Results  Component Value Date   TESTOSTERONE 514.76 05/01/2016   TESTOSTERONE 492 10/26/2015    Previous labs: Prolactin level: 7.8 LH level: 3.9    Wt Readings from Last 3 Encounters:  11/06/16 192 lb (87.1 kg)  06/21/16 188 lb 8 oz (85.5 kg)  05/01/16 188 lb (85.3 kg)    Allergies as of 11/06/2016      Reactions   Tramadol    REACTION: hives      Medication List       Accurate as of 11/06/16  8:44 AM. Always use your most recent med list.          carvedilol 6.25 MG tablet Commonly known as:  COREG Take 1 tablet (6.25 mg total) by mouth 2 (two) times daily.   clonazePAM 0.5 MG tablet Commonly known as:  KLONOPIN Take 1 tablet (0.5 mg total)  by mouth 2 (two) times daily as needed for anxiety.   folic acid 1 MG tablet Commonly known as:  FOLVITE Take 1 tablet (1 mg total) by mouth daily.   multivitamin with minerals Tabs tablet Take 1 tablet by mouth daily.   Testosterone 30 MG/ACT Soln Commonly known as:  AXIRON Use one application under each arm daily   thiamine 100 MG tablet Take 1 tablet (100 mg total) by mouth daily.   zolpidem 10 MG tablet Commonly known as:  AMBIEN Take 1 tablet (10 mg total) by mouth at bedtime as needed for sleep.       Allergies:  Allergies  Allergen Reactions  . Tramadol     REACTION: hives    Past Medical History:  Diagnosis Date  . Anxiety and depression   . Hypertension   . Hyponatremia 03-2013   ongoing, seeing Dr. Posey Pronto  . Insomnia   . SIADH (syndrome of inappropriate ADH production) ([place]) 2017   Dr. Posey Pronto    Past Surgical History:  Procedure Laterality Date  . SHOULDER SURGERY  1980s   right shoulder    Family History  Problem Relation Age of Onset  . Hypertension Mother   . Stroke Mother   . Diabetes Mother   . Emphysema Mother  smoked  . Colon cancer Neg Hx   . Prostate cancer Neg Hx   . CAD Neg Hx     Social History:  reports that he has never smoked. He has never used smokeless tobacco. He reports that he drinks alcohol. He reports that he does not use drugs.  ROS  He has had a weight gain over the last 2 years And this is slightly higher again now   Wt Readings from Last 3 Encounters:  11/06/16 192 lb (87.1 kg)  06/21/16 188 lb 8 oz (85.5 kg)  05/01/16 188 lb (85.3 kg)   PSA Followed by PCP Last hemoglobin below 14  General Examination:   BP 128/82   Pulse 66   Ht 6' (1.829 m)   Wt 192 lb (87.1 kg)   SpO2 94%   BMI 26.04 kg/m    Assessment/ Plan:   Hypogonadotropic hypogonadism, mild He was diagnosed with 1 level of low free testosterone Subjectively he feels better with testosterone supplementation especially with more  motivation and energy as well as improved libido  He is requiring only a small dose of 1 pump daily as of Axiron Subsequent levels have been normal He is more compliant with the treatment now   PSA being monitored by PCP He will have his testosterone level checked today to confirm the doses Follow-up in 6 months  [place] Rosamond 11/06/2016, 8:44 AM

## 2016-11-06 NOTE — Progress Notes (Signed)
Please let patient know that the lab result is normal and no change needed

## 2016-11-15 ENCOUNTER — Other Ambulatory Visit: Payer: Self-pay | Admitting: Internal Medicine

## 2016-11-16 NOTE — Telephone Encounter (Signed)
Pt is requesting refill on Ambien.  Last OV: 06/21/2016 Last Fill: 05/15/2016 #90 and 1RF UDS: Not need for Ambien   Please advise.

## 2016-11-16 NOTE — Telephone Encounter (Signed)
Ok 90 and 1 RF 

## 2016-11-16 NOTE — Telephone Encounter (Signed)
Rx faxed to [facility].  

## 2016-11-16 NOTE — Telephone Encounter (Signed)
Rx printed, awaiting MD signature.  

## 2016-12-17 ENCOUNTER — Other Ambulatory Visit: Payer: Self-pay | Admitting: Internal Medicine

## 2016-12-18 NOTE — Telephone Encounter (Signed)
Ok 60 and 2 RF

## 2016-12-18 NOTE — Telephone Encounter (Signed)
Pt is requesting refill on Clonazepam.  Last OV: 06/21/2016 Last Fill: 07/13/2016 #60 and 2RF UDS: 03/08/2016 Low risk  Please advise.

## 2016-12-18 NOTE — Telephone Encounter (Signed)
Rx printed, awaiting MD signature.  

## 2016-12-18 NOTE — Telephone Encounter (Signed)
Rx faxed to [facility].  

## 2017-01-03 DIAGNOSIS — E871 Hypo-osmolality and hyponatremia: Secondary | ICD-10-CM | POA: Diagnosis not present

## 2017-01-03 DIAGNOSIS — E559 Vitamin D deficiency, unspecified: Secondary | ICD-10-CM | POA: Diagnosis not present

## 2017-01-03 LAB — VITAMIN D 25 HYDROXY (VIT D DEFICIENCY, FRACTURES): VIT D 25 HYDROXY: 33.5

## 2017-01-08 DIAGNOSIS — I10 Essential (primary) hypertension: Secondary | ICD-10-CM | POA: Diagnosis not present

## 2017-01-08 DIAGNOSIS — E559 Vitamin D deficiency, unspecified: Secondary | ICD-10-CM | POA: Diagnosis not present

## 2017-01-08 DIAGNOSIS — E871 Hypo-osmolality and hyponatremia: Secondary | ICD-10-CM | POA: Diagnosis not present

## 2017-01-10 ENCOUNTER — Encounter: Payer: Self-pay | Admitting: Internal Medicine

## 2017-01-29 ENCOUNTER — Other Ambulatory Visit: Payer: Self-pay | Admitting: Internal Medicine

## 2017-02-06 ENCOUNTER — Other Ambulatory Visit: Payer: Self-pay | Admitting: Internal Medicine

## 2017-02-06 NOTE — Telephone Encounter (Signed)
Not seen in > 1 year so rec let PCP handle this or make appt to see me or NP ASAP

## 2017-02-27 ENCOUNTER — Other Ambulatory Visit: Payer: Self-pay | Admitting: Internal Medicine

## 2017-03-07 ENCOUNTER — Other Ambulatory Visit: Payer: Self-pay | Admitting: Internal Medicine

## 2017-03-08 ENCOUNTER — Ambulatory Visit: Payer: Medicare Other | Admitting: Internal Medicine

## 2017-03-08 DIAGNOSIS — H5203 Hypermetropia, bilateral: Secondary | ICD-10-CM | POA: Diagnosis not present

## 2017-03-08 DIAGNOSIS — H52223 Regular astigmatism, bilateral: Secondary | ICD-10-CM | POA: Diagnosis not present

## 2017-03-08 DIAGNOSIS — H2513 Age-related nuclear cataract, bilateral: Secondary | ICD-10-CM | POA: Diagnosis not present

## 2017-03-08 DIAGNOSIS — H11153 Pinguecula, bilateral: Secondary | ICD-10-CM | POA: Diagnosis not present

## 2017-03-09 ENCOUNTER — Ambulatory Visit (INDEPENDENT_AMBULATORY_CARE_PROVIDER_SITE_OTHER): Payer: Medicare Other | Admitting: Internal Medicine

## 2017-03-09 ENCOUNTER — Encounter: Payer: Self-pay | Admitting: Internal Medicine

## 2017-03-09 VITALS — BP 124/78 | HR 67 | Temp 97.4°F | Resp 14 | Ht 72.0 in | Wt 193.1 lb

## 2017-03-09 DIAGNOSIS — E871 Hypo-osmolality and hyponatremia: Secondary | ICD-10-CM

## 2017-03-09 DIAGNOSIS — Z1159 Encounter for screening for other viral diseases: Secondary | ICD-10-CM

## 2017-03-09 DIAGNOSIS — E222 Syndrome of inappropriate secretion of antidiuretic hormone: Secondary | ICD-10-CM | POA: Diagnosis not present

## 2017-03-09 DIAGNOSIS — I10 Essential (primary) hypertension: Secondary | ICD-10-CM | POA: Diagnosis not present

## 2017-03-09 MED ORDER — CARVEDILOL 6.25 MG PO TABS: 6.2500 mg | ORAL_TABLET | Freq: Two times a day (BID) | ORAL | 2 refills | Status: DC

## 2017-03-09 NOTE — Progress Notes (Signed)
Subjective:    Patient ID: Bobby [name], male    DOB: 15-Aug-1951, 66 y.o.   MRN: [id number]  DOS:  03/09/2017 Type of visit - description : Routine checkup Interval history: Hypogonadism, good compliance with medications Hyponatremia, note from renal reviewed. History of anxiety, at this point stress is okay.    Review of Systems Denies chest pain or difficulty breathing No nausea, vomiting, diarrhea No headaches No recent problems with memory issues.  Past Medical History:  Diagnosis Date  . Anxiety and depression   . Hypertension   . Hyponatremia 03-2013   ongoing, seeing Dr. Posey Pronto  . Insomnia   . SIADH (syndrome of inappropriate ADH production) ([place]) 2017   Dr. Posey Pronto    Past Surgical History:  Procedure Laterality Date  . SHOULDER SURGERY  1980s   right shoulder    Social History   Social History  . Marital status: Married    Spouse name: N/A  . Number of children: 2  . 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 Smoking status: Never Smoker  . Smokeless tobacco: Never Used  . Alcohol use 0.0 oz/week     Comment: socially , 2 to 3 drinks  a day  . Drug use: No  . Sexual activity: Not on file   Other Topics Concern  . Not on file   Social History Narrative   [name], Married,    2 children   mom passed away May 20, 2008                      Allergies as of 03/09/2017      Reactions   Tramadol    REACTION: hives      Medication List       Accurate as of 03/09/17  4:30 PM. Always use your most recent med list.          carvedilol 6.25 MG tablet Commonly known as:  COREG Take 1 tablet (6.25 mg total) by mouth 2 (two) times daily with a meal.   clonazePAM 0.5 MG tablet Commonly known as:  KLONOPIN Take 1 tablet (0.5 mg total) by mouth 2 (two) times daily as needed for anxiety.   folic acid 1 MG tablet Commonly known as:  FOLVITE Take 1 tablet (1 mg total) by mouth daily.   multivitamin with minerals Tabs tablet Take 1 tablet by mouth daily.   Testosterone 30 MG/ACT Soln Commonly known as:  AXIRON Use one application under each arm daily   thiamine 100 MG tablet Take 1 tablet (100 mg total) by mouth daily.   zolpidem 10 MG tablet Commonly known as:  AMBIEN Take 1 tablet (10 mg total) by mouth at bedtime as needed for sleep.          Objective:   Physical Exam BP 124/78 (BP Location: Right Arm, Patient Position: Sitting, Cuff Size: Small)   Pulse 67   Temp 97.4 F (36.3 C) (Oral)   Resp 14   Ht 6' (1.829 m)   Wt 193 lb 2 oz (87.6 kg)   SpO2 96%   BMI 26.19 kg/m  General:   Well developed, well nourished . NAD.  HEENT:  Normocephalic . Face symmetric, atraumatic Lungs:  CTA B Normal respiratory effort, no intercostal retractions, no accessory muscle use. Heart: RRR,  no murmur.  No pretibial edema bilaterally  Skin: Not pale. Not jaundice Neurologic:  alert & oriented X3.  Speech normal, gait appropriate for age and unassisted Psych--  Cognition and judgment appear intact.  Cooperative with normal attention span and concentration.  Behavior appropriate. No anxious or depressed appearing.      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 Hyponatremia onset 2014, admitted. 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 Check a CBC Hypogonadism, on low-dose HRT, last testosterone satisfactory. Per  Endo SIADH: Last sodium checked by renal normal per patient, not really following a strict fluid restriction diet, states that he can "control" his sodium by eating more or less  salt . Check a BMP. Primary care: got a shingrex  a week ago at the pharmacy. Hep C screening today RTC 6 weeks, CPX

## 2017-03-09 NOTE — Patient Instructions (Signed)
GO TO THE LAB : Get the blood work     GO TO THE FRONT DESK Schedule your next appointment for a  physical exam in 6 months  

## 2017-03-09 NOTE — Assessment & Plan Note (Signed)
HTN: controlled, refill carvedilol. Check a CBC Hypogonadism, on low-dose HRT, last testosterone satisfactory. Per  Endo SIADH: Last sodium checked by renal normal per patient, not really following a strict fluid restriction diet, states that he can "control" his sodium by eating more or less  salt . Check a BMP. Primary care: got a shingrex  a week ago at the pharmacy. Hep C screening today RTC 6 weeks, CPX

## 2017-03-09 NOTE — Progress Notes (Signed)
Pre visit review using our clinic review tool, if applicable. No additional management support is needed unless otherwise documented below in the visit note. 

## 2017-04-04 ENCOUNTER — Telehealth: Payer: Self-pay | Admitting: *Deleted

## 2017-04-04 NOTE — Telephone Encounter (Signed)
Called patient and left message to return call to schedule AWV w/ [facility] after 06/21/17.

## 2017-04-10 ENCOUNTER — Telehealth: Payer: Self-pay | Admitting: Internal Medicine

## 2017-04-10 NOTE — Telephone Encounter (Signed)
Refills sent on 03/09/2017 #180 and 2 refills.

## 2017-04-10 NOTE — Telephone Encounter (Signed)
Relation to ZH:GDJM Call back number:[phone] Pharmacy: [id number] - [place], [place] [phone] (Phone) [phone] (Fax)    Reason for call:  Patient requesting 90 day supply carvedilol (COREG) 6.25 MG tablet. Patient states please note all medications request  90 day supply, please advise

## 2017-04-17 ENCOUNTER — Other Ambulatory Visit: Payer: Self-pay | Admitting: Internal Medicine

## 2017-04-18 NOTE — Telephone Encounter (Signed)
Rx printed, awaiting MD signature.  

## 2017-04-18 NOTE — Telephone Encounter (Signed)
Okay #60, 3 refills

## 2017-04-18 NOTE — Telephone Encounter (Signed)
Pt is requesting refill on Clonazepam.  Last OV: 03/09/2017 Last Fill: 12/18/2016 #60 and 2RF UDS: 03/08/2016 Low risk  Please advise.

## 2017-04-18 NOTE — Telephone Encounter (Signed)
Rx faxed to [facility].  

## 2017-04-26 ENCOUNTER — Other Ambulatory Visit: Payer: Self-pay | Admitting: Internal Medicine

## 2017-04-26 NOTE — Telephone Encounter (Signed)
Rx printed, awaiting MD signature.  

## 2017-04-26 NOTE — Telephone Encounter (Signed)
Pt is requesting refill on Ambien 10mg .  Last OV: 03/09/2017 Last Fill: 11/16/2016 #90 and 1RF UDS: 03/08/2016 Low risk  Please advise.

## 2017-04-26 NOTE — Telephone Encounter (Signed)
Okay #90, one refill

## 2017-04-26 NOTE — Telephone Encounter (Signed)
Rx faxed to [facility].  

## 2017-05-07 ENCOUNTER — Telehealth: Payer: Self-pay | Admitting: Endocrinology

## 2017-05-07 ENCOUNTER — Telehealth: Payer: Self-pay | Admitting: Internal Medicine

## 2017-05-07 ENCOUNTER — Ambulatory Visit: Payer: BLUE CROSS/BLUE SHIELD | Admitting: Endocrinology

## 2017-05-07 DIAGNOSIS — Z0289 Encounter for other administrative examinations: Secondary | ICD-10-CM

## 2017-05-07 NOTE — Telephone Encounter (Signed)
Patient no showed today's appt. Please advise on how to follow up. °A. No follow up necessary. °B. Follow up urgent. Contact patient immediately. °C. Follow up necessary. Contact patient and schedule visit in ___ days. °D. Follow up advised. Contact patient and schedule visit in ____weeks. ° °

## 2017-05-07 NOTE — Telephone Encounter (Signed)
Follow-up necessary, please reschedule within the next month

## 2017-05-07 NOTE — Progress Notes (Deleted)
Patient ID: Bobby [name], male   DOB: 1951/05/22, 66 y.o.   MRN: [id number]          Chief complaint: Low testosterone, follow-up  History of Present Illness  The patient was found to have hypogonadism when he was having symptoms of decreased libido, decreased motivation but no significant fatigue.  He was also concerned because of family History of hypogonadism and his brother The exam showed left testicle 3-3.5 cm and right testicle 3 cm.Both testicles are relatively soft   Prior lab results show baselinetestosterone level of 307, done twice in 5/16 Free testosterone was slightly low on the first test but low normal at 6.9 on the second test  He has been on testosterone supplementation starting in 7/16 with Axiron which is the preferred drug on his plan He is applying one pump under each arm  With testosterone supplementation his libido, motivation and energy has been fairly good  On his last visit he was told to use the Axiron only 1 pump a day since even with not taking his medication daily his level was over 500 He has been more regular with applying this just about every day Again he feels fairly good with his energy level, motivation and libido  He did not go to get his labs done on this visit  Lab Results  Component Value Date   TESTOSTERONE 403.28 11/06/2016   TESTOSTERONE 514.76 05/01/2016   TESTOSTERONE 492 10/26/2015    Previous labs: Prolactin level: 7.8 LH level: 3.9    Wt Readings from Last 3 Encounters:  03/09/17 193 lb 2 oz (87.6 kg)  11/06/16 192 lb (87.1 kg)  06/21/16 188 lb 8 oz (85.5 kg)    Allergies as of 05/07/2017      Reactions   Tramadol    REACTION: hives      Medication List       Accurate as of 05/07/17  8:04 AM. Always use your most recent med list.          carvedilol 6.25 MG tablet Commonly known as:  COREG Take 1 tablet (6.25 mg total) by mouth 2 (two) times daily with a meal.   clonazePAM 0.5 MG tablet Commonly  known as:  KLONOPIN Take 1 tablet (0.5 mg total) by mouth 2 (two) times daily as needed for anxiety.   folic acid 1 MG tablet Commonly known as:  FOLVITE Take 1 tablet (1 mg total) by mouth daily.   multivitamin with minerals Tabs tablet Take 1 tablet by mouth daily.   Testosterone 30 MG/ACT Soln Commonly known as:  AXIRON Use one application under each arm daily   thiamine 100 MG tablet Take 1 tablet (100 mg total) by mouth daily.   zolpidem 10 MG tablet Commonly known as:  AMBIEN Take 1 tablet (10 mg total) by mouth at bedtime as needed for sleep.       Allergies:  Allergies  Allergen Reactions  . Tramadol     REACTION: hives    Past Medical History:  Diagnosis Date  . Anxiety and depression   . Hypertension   . Hyponatremia 03-2013   ongoing, seeing Dr. Posey Pronto  . Insomnia   . SIADH (syndrome of inappropriate ADH production) ([place]) 2017   Dr. Posey Pronto    Past Surgical History:  Procedure Laterality Date  . SHOULDER SURGERY  1980s   right shoulder    Family History  Problem Relation Age of Onset  . Hypertension Mother   . Stroke Mother   .  Diabetes Mother   . Emphysema Mother        smoked  . Colon cancer Neg Hx   . Prostate cancer Neg Hx   . CAD Neg Hx     Social History:  reports that he has never smoked. He has never used smokeless tobacco. He reports that he drinks alcohol. He reports that he does not use drugs.  ROS  He has had a weight gain over the last 2 years And this is slightly higher again now   Wt Readings from Last 3 Encounters:  03/09/17 193 lb 2 oz (87.6 kg)  11/06/16 192 lb (87.1 kg)  06/21/16 188 lb 8 oz (85.5 kg)   PSA Followed by PCP Last hemoglobin below 14  General Examination:   There were no vitals taken for this visit.   Assessment/ Plan:   Hypogonadotropic hypogonadism, mild He was diagnosed with 1 level of low free testosterone Subjectively he feels better with testosterone supplementation especially with more  motivation and energy as well as improved libido  He is requiring only a small dose of 1 pump daily as of Axiron Subsequent levels have been normal He is more compliant with the treatment now   PSA being monitored by PCP He will have his testosterone level checked today to confirm the doses Follow-up in 6 months  Bobby [name] 05/07/2017, 8:04 AM

## 2017-05-07 NOTE — Telephone Encounter (Signed)
Patient would like to know if the no show fee can be reduced because he confused his dates for appts?  Thank you,  -LL

## 2017-05-09 ENCOUNTER — Encounter: Payer: Self-pay | Admitting: Endocrinology

## 2017-05-09 ENCOUNTER — Ambulatory Visit (INDEPENDENT_AMBULATORY_CARE_PROVIDER_SITE_OTHER): Payer: Medicare Other | Admitting: Endocrinology

## 2017-05-09 VITALS — BP 130/88 | HR 112 | Ht 72.0 in | Wt 193.0 lb

## 2017-05-09 DIAGNOSIS — E291 Testicular hypofunction: Secondary | ICD-10-CM

## 2017-05-09 LAB — CBC WITH DIFFERENTIAL/PLATELET
BASOS ABS: 0.1 10*3/uL (ref 0.0–0.1)
Basophils Relative: 0.4 % (ref 0.0–3.0)
EOS ABS: 0.2 10*3/uL (ref 0.0–0.7)
Eosinophils Relative: 1.3 % (ref 0.0–5.0)
HEMATOCRIT: 39.5 % (ref 39.0–52.0)
Hemoglobin: 13.5 g/dL (ref 13.0–17.0)
LYMPHS PCT: 23.4 % (ref 12.0–46.0)
Lymphs Abs: 3.3 10*3/uL (ref 0.7–4.0)
MCHC: 34.1 g/dL (ref 30.0–36.0)
MCV: 93.6 fl (ref 78.0–100.0)
Monocytes Absolute: 1.4 10*3/uL — ABNORMAL HIGH (ref 0.1–1.0)
Monocytes Relative: 9.8 % (ref 3.0–12.0)
NEUTROS ABS: 9.1 10*3/uL — AB (ref 1.4–7.7)
Neutrophils Relative %: 65.1 % (ref 43.0–77.0)
PLATELETS: 211 10*3/uL (ref 150.0–400.0)
RBC: 4.22 Mil/uL (ref 4.22–5.81)
RDW: 13 % (ref 11.5–15.5)
WBC: 14.1 10*3/uL — AB (ref 4.0–10.5)

## 2017-05-09 LAB — TESTOSTERONE: Testosterone: 170.98 ng/dL — ABNORMAL LOW (ref 300.00–890.00)

## 2017-05-09 NOTE — Progress Notes (Signed)
Patient ID: Bobby [name], male   DOB: 04-14-1951, 66 y.o.   MRN: [id number]          Chief complaint: Low testosterone, follow-up  History of Present Illness  The patient was found to have hypogonadism when he was having symptoms of decreased libido, decreased motivation but no significant fatigue.  He was also concerned because of family History of hypogonadism and his brother The exam showed left testicle 3-3.5 cm and right testicle 3 cm.Both testicles are relatively soft   Prior lab results show baselinetestosterone level of 307, done twice in 5/16 Free testosterone was slightly low on the first test but low normal at 6.9 on the second test  He has been on testosterone supplementation starting in 7/16 with Axiron which is the preferred drug on his plan He is applying one pump under each arm  With testosterone supplementation his libido, motivation and energy has been fairly good  On his last visit he was told to use the Axiron only 1 pump a day since even with not taking his medication daily his level was over 500  However he now states that he still uses one pump on each arm because he does not remember to do it everyday  Again he feels fairly good with his energy level, motivation and libido Even on the days he is not applying the AndroGel he feels fairly good  He did not go to get his labs done on this visit  Lab Results  Component Value Date   TESTOSTERONE 403.28 11/06/2016   TESTOSTERONE 514.76 05/01/2016   TESTOSTERONE 492 10/26/2015    Previous labs: Prolactin level: 7.8 LH level: 3.9    Wt Readings from Last 3 Encounters:  05/09/17 193 lb (87.5 kg)  03/09/17 193 lb 2 oz (87.6 kg)  11/06/16 192 lb (87.1 kg)    Allergies as of 05/09/2017      Reactions   Tramadol    REACTION: hives      Medication List       Accurate as of 05/09/17  3:53 PM. Always use your most recent med list.          carvedilol 6.25 MG tablet Commonly known as:   COREG Take 1 tablet (6.25 mg total) by mouth 2 (two) times daily with a meal.   clonazePAM 0.5 MG tablet Commonly known as:  KLONOPIN Take 1 tablet (0.5 mg total) by mouth 2 (two) times daily as needed for anxiety.   folic acid 1 MG tablet Commonly known as:  FOLVITE Take 1 tablet (1 mg total) by mouth daily.   multivitamin with minerals Tabs tablet Take 1 tablet by mouth daily.   Testosterone 30 MG/ACT Soln Commonly known as:  AXIRON Use one application under each arm daily   thiamine 100 MG tablet Take 1 tablet (100 mg total) by mouth daily.   zolpidem 10 MG tablet Commonly known as:  AMBIEN Take 1 tablet (10 mg total) by mouth at bedtime as needed for sleep.       Allergies:  Allergies  Allergen Reactions  . Tramadol     REACTION: hives    Past Medical History:  Diagnosis Date  . Anxiety and depression   . Hypertension   . Hyponatremia 03-2013   ongoing, seeing Dr. Posey Pronto  . Insomnia   . SIADH (syndrome of inappropriate ADH production) ([place]) 2017   Dr. Posey Pronto    Past Surgical History:  Procedure Laterality Date  . SHOULDER SURGERY  1980s  right shoulder    Family History  Problem Relation Age of Onset  . Hypertension Mother   . Stroke Mother   . Diabetes Mother   . Emphysema Mother        smoked  . Colon cancer Neg Hx   . Prostate cancer Neg Hx   . CAD Neg Hx     Social History:  reports that he has never smoked. He has never used smokeless tobacco. He reports that he drinks alcohol. He reports that he does not use drugs.  ROS  He has had a weight gain over the last 2 years And this is slightly higher again now   Wt Readings from Last 3 Encounters:  05/09/17 193 lb (87.5 kg)  03/09/17 193 lb 2 oz (87.6 kg)  11/06/16 192 lb (87.1 kg)   PSA Followed by PCP Last hemoglobin below 14  Lab Results  Component Value Date   PSA 2.97 06/21/2016   PSA 2.34 03/08/2016   PSA 1.17 05/05/2014     Some insomnia   General Examination:   BP  130/88   Pulse (!) 112   Ht 6' (1.829 m)   Wt 193 lb (87.5 kg)   SpO2 96%   BMI 26.18 kg/m    Assessment/ Plan:   Hypogonadotropic hypogonadism, mild He was diagnosed with A level of low free testosterone Subjectively he feels better with testosterone supplementation  with improved motivation and energy as well as improved libido  He is irregular with applying the testosterone supplement again but he says he feels fairly good even when he applies this 4 days a week He does need a follow-up testosterone level today and will discuss doses based on this Will also make sure his CBC is normal   PSA being monitored by PCP  Follow-up in 6 months  Bobby [name] 05/09/2017, 3:53 PM   ADDENDUM: Testosterone level is 170, will need to check with him if he has used the Axiron in the last few days

## 2017-05-15 NOTE — Telephone Encounter (Signed)
LM for the pt to let him know that I have sent a request for the NS fee to be removed.

## 2017-08-13 ENCOUNTER — Telehealth: Payer: Self-pay | Admitting: Internal Medicine

## 2017-08-13 NOTE — Telephone Encounter (Signed)
Relation to QQ:UIVHOYWV,XUCJARW Call back number:[phone]   Reason for call:  Spouse scheduled patient physical for 08/21/17 at 9am with PCP. Spouse states at last visit if sodium was every a concern PCP would placed orders to check, spouse requesting orders, please advise  *please advise spouse directly in need of clarity

## 2017-08-13 NOTE — Telephone Encounter (Signed)
Labs will be completed at time of CPE since appt is at 0900.

## 2017-08-21 ENCOUNTER — Ambulatory Visit (INDEPENDENT_AMBULATORY_CARE_PROVIDER_SITE_OTHER): Payer: Medicare Other | Admitting: Internal Medicine

## 2017-08-21 ENCOUNTER — Encounter: Payer: Self-pay | Admitting: Internal Medicine

## 2017-08-21 VITALS — BP 124/64 | HR 65 | Temp 98.0°F | Resp 14 | Ht 72.0 in | Wt 188.1 lb

## 2017-08-21 DIAGNOSIS — Z23 Encounter for immunization: Secondary | ICD-10-CM

## 2017-08-21 DIAGNOSIS — E875 Hyperkalemia: Secondary | ICD-10-CM

## 2017-08-21 DIAGNOSIS — I10 Essential (primary) hypertension: Secondary | ICD-10-CM

## 2017-08-21 DIAGNOSIS — E291 Testicular hypofunction: Secondary | ICD-10-CM

## 2017-08-21 DIAGNOSIS — G4700 Insomnia, unspecified: Secondary | ICD-10-CM | POA: Diagnosis not present

## 2017-08-21 DIAGNOSIS — F411 Generalized anxiety disorder: Secondary | ICD-10-CM | POA: Diagnosis not present

## 2017-08-21 DIAGNOSIS — R972 Elevated prostate specific antigen [PSA]: Secondary | ICD-10-CM

## 2017-08-21 DIAGNOSIS — R410 Disorientation, unspecified: Secondary | ICD-10-CM

## 2017-08-21 DIAGNOSIS — Z1159 Encounter for screening for other viral diseases: Secondary | ICD-10-CM | POA: Diagnosis not present

## 2017-08-21 DIAGNOSIS — Z0000 Encounter for general adult medical examination without abnormal findings: Secondary | ICD-10-CM

## 2017-08-21 DIAGNOSIS — M545 Low back pain: Secondary | ICD-10-CM | POA: Diagnosis not present

## 2017-08-21 LAB — LIPID PANEL
CHOL/HDL RATIO: 3
CHOLESTEROL: 168 mg/dL (ref 0–200)
HDL: 57.7 mg/dL (ref >39.00)
LDL Cholesterol: 95 mg/dL (ref 0–99)
NonHDL: 110.71
TRIGLYCERIDES: 81 mg/dL (ref 0.0–149.0)
VLDL: 16.2 mg/dL (ref 0.0–40.0)

## 2017-08-21 LAB — CBC WITH DIFFERENTIAL/PLATELET
BASOS ABS: 0 10*3/uL (ref 0.0–0.1)
BASOS PCT: 0.4 % (ref 0.0–3.0)
EOS ABS: 0.2 10*3/uL (ref 0.0–0.7)
Eosinophils Relative: 2.1 % (ref 0.0–5.0)
HEMATOCRIT: 40.3 % (ref 39.0–52.0)
HEMOGLOBIN: 14 g/dL (ref 13.0–17.0)
LYMPHS PCT: 36.5 % (ref 12.0–46.0)
Lymphs Abs: 3.8 10*3/uL (ref 0.7–4.0)
MCHC: 34.6 g/dL (ref 30.0–36.0)
MCV: 94.7 fl (ref 78.0–100.0)
MONOS PCT: 7.1 % (ref 3.0–12.0)
Monocytes Absolute: 0.7 10*3/uL (ref 0.1–1.0)
NEUTROS ABS: 5.7 10*3/uL (ref 1.4–7.7)
Neutrophils Relative %: 53.9 % (ref 43.0–77.0)
PLATELETS: 210 10*3/uL (ref 150.0–400.0)
RBC: 4.26 Mil/uL (ref 4.22–5.81)
RDW: 13.4 % (ref 11.5–15.5)
WBC: 10.5 10*3/uL (ref 4.0–10.5)

## 2017-08-21 LAB — COMPREHENSIVE METABOLIC PANEL
ALBUMIN: 4.3 g/dL (ref 3.5–5.2)
ALT: 21 U/L (ref 0–53)
AST: 22 U/L (ref 0–37)
Alkaline Phosphatase: 60 U/L (ref 39–117)
BILIRUBIN TOTAL: 0.7 mg/dL (ref 0.2–1.2)
BUN: 16 mg/dL (ref 6–23)
CALCIUM: 9.4 mg/dL (ref 8.4–10.5)
CO2: 30 mEq/L (ref 19–32)
CREATININE: 0.91 mg/dL (ref 0.40–1.50)
Chloride: 96 mEq/L (ref 96–112)
GFR: 87.97 mL/min (ref >60.00)
Glucose, Bld: 98 mg/dL (ref 70–99)
Potassium: 5.5 mEq/L — ABNORMAL HIGH (ref 3.5–5.1)
SODIUM: 132 meq/L — AB (ref 135–145)
Total Protein: 6.5 g/dL (ref 6.0–8.3)

## 2017-08-21 LAB — PSA: PSA: 2.33 ng/mL (ref 0.10–4.00)

## 2017-08-21 NOTE — Patient Instructions (Signed)
GO TO THE LAB : Get the blood work     GO TO THE FRONT DESK Schedule your next appointment for a  routine checkup in 8 months   Get your [facility]  #2

## 2017-08-21 NOTE — Progress Notes (Signed)
Pre visit review using our clinic review tool, if applicable. No additional management support is needed unless otherwise documented below in the visit note. 

## 2017-08-21 NOTE — Assessment & Plan Note (Signed)
-  Td 2010;  zostavax 2014 ;  PNM shot --2015;  prevnar--2016 . Flu shot today. Had shingrix #1, rec #2 at the pharmacy [facility]:  had a Cscope aprox 2007, Dr Watt Climes  ---->  repeated Cscope 08-2009 ---->  Tubular adenoma per bx report------> colonoscopy 09-2015, no polyps, 5 years -On HRT, DRE today normal, check a PSA to rule out increased PSA velocity -Hep C screening

## 2017-08-21 NOTE — Progress Notes (Signed)
Subjective:    Patient ID: Bobby [name], male    DOB: [birth year], 66 y.o.   MRN: [id number]  DOS:  08/21/2017 Type of visit - description : rov, here w/ his wife Interval history: HTN: Good compliance with medication, BP today is very good Hypogonadism: Note from endocrinology reviewed Hyponatremia: Saw nephrology 12/2016, note reviewed, felt to be stable Insomnia, anxiety: on clonazepam and Ambien, symptoms controlled. Rash, bilateral shoulders, already getting better per patient  Review of Systems Denies chest pain, difficulty breathing. No lower extremity edema Patient exercises regularly without symptoms No depression No dysuria, gross hematuria difficulty urinating  Past Medical History:  Diagnosis Date  . Anxiety and depression   . Hypertension   . Hyponatremia 03-2013   ongoing, seeing Dr. Posey [name]  . Insomnia   . SIADH (syndrome of inappropriate ADH production) ([place]) 2017   Dr. Posey [name]    Past Surgical History:  Procedure Laterality Date  . SHOULDER SURGERY  1980s   right shoulder    Social History   Social History  . Marital status: Married    Spouse name: N/A  . Number of children: 2  . 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 Smoking status: Never Smoker  . Smokeless tobacco: Never Used  . Alcohol use 0.0 oz/week     Comment: socially , 2 to 3 drinks  a day  . Drug use: No  . Sexual activity: Not on file   Other Topics Concern  . Not on file   Social History Narrative   "Bobby [name]", Married,    2 children   mom passed away 2008-06-02                      Allergies as of 08/21/2017      Reactions   Tramadol    REACTION: hives      Medication List       Accurate as of 08/21/17 11:59 PM. Always use your most recent med list.          carvedilol 6.25 MG tablet Commonly known as:  COREG Take 1 tablet (6.25 mg total) by mouth 2 (two) times daily with a meal.   clonazePAM 0.5  MG tablet Commonly known as:  KLONOPIN Take 1 tablet (0.5 mg total) by mouth 2 (two) times daily as needed for anxiety.   folic acid 1 MG tablet Commonly known as:  FOLVITE Take 1 tablet (1 mg total) by mouth daily.   multivitamin with minerals Tabs tablet Take 1 tablet by mouth daily.   Testosterone 30 MG/ACT Soln Commonly known as:  AXIRON Use one application under each arm daily   thiamine 100 MG tablet Take 1 tablet (100 mg total) by mouth daily.   zolpidem 10 MG tablet Commonly known as:  AMBIEN Take 1 tablet (10 mg total) by mouth at bedtime as needed for sleep.          Objective:   Physical Exam BP 124/64 (BP Location: Left Arm, Patient Position: Sitting, Cuff Size: Small)   Pulse 65   Temp 98 F (36.7 C) (Oral)   Resp 14   Ht 6' (1.829 m)   Wt 188 lb 2 oz (85.3 kg)   SpO2 97%   BMI 25.51 kg/m   General:   Well developed, well nourished . NAD.  Neck: No  thyromegaly  HEENT:  Normocephalic . Face  symmetric, atraumatic Lungs:  CTA B Normal respiratory effort, no intercostal retractions, no accessory muscle use. Heart: RRR,  no murmur.  No pretibial edema bilaterally  Abdomen:  Not distended, soft, non-tender. No rebound or rigidity.   Skin: Shoulders skin slightly dry, scaly  Rectal:  External abnormalities: none. Normal sphincter tone. No rectal masses or tenderness.  No stools found Prostate: Prostate gland firm and smooth, no enlargement, nodularity, tenderness, mass, asymmetry or induration.  Neurologic:  alert & oriented X3.  Speech normal, gait appropriate for age and unassisted Strength symmetric and appropriate for age.  Psych: Cognition and judgment appear intact.  Cooperative with normal attention span and concentration.  Behavior appropriate. No anxious or depressed appearing.    Assessment & Plan:   Assessment  HTN Anxiety, depression, insomnia : used lexapro, d/c (decreased libido) Daily etoh use  Hypogonadotrophic  Hypogonadism -- saw Dr Bobby [name] 05-2015, rx HRT Memory problems? Hyponatremia onset 2014, admitted. 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 Check a UDS Hypogonadism: Per Dr. Dwyane [name], checking a PSA and CBC today. Will check testosterone levels at patient request and forward to endocrinology Hyponatremia: Last seen by nephrology 12-2016, follow-up in one year.  Rash, arms: Unclear etiology, getting better per patient, recommend to see dermatology which he is due for a visit Episodic confusion: Per wife report, refer to neurology Addendum at the end of the visit today wife had a number of questions about pt's care, she seems to think that I'm not checking enough labs or ordering a stress test, states her friends get a number of panels apparently I'm not checking him enough. Also she called trying to get a emergent BMP without giving a reason, today she said that the patient was confused that day but that was not stated in the note I see; I had an open discussion with her, if she is not satisfied with the care they are getting from me , they are welcome to find another physician. She said that she just like to be able to ask questions and I think I'm answering them whenever I'm  asked, yet she does not seem satisfied with the care I provide  .  RTC 8 months.

## 2017-08-22 NOTE — Assessment & Plan Note (Addendum)
Anxiety depression insomnia: Currently only on Klonopin and Ambien, sxs controlled. Check a UDS Hypogonadism: Per Dr. Dwyane Dee, checking a PSA and CBC today. Will check testosterone levels at patient request and forward to endocrinology Hyponatremia: Last seen by nephrology 12-2016, follow-up in one year.  Rash, arms: Unclear etiology, getting better per patient, recommend to see dermatology which he is due for a visit Episodic confusion: Per wife report, refer to neurology Addendum at the end of the visit today wife had a number of questions about pt's care, she seems to think that I'm not checking enough labs or ordering a stress test, states her friends get a number of panels apparently I'm not checking him enough. Also she called trying to get a emergent BMP without giving a reason, today she said that the patient was confused that day but that was not stated in the note I see; I had an open discussion with her, if she is not satisfied with the care they are getting from me , they are welcome to find another physician. She said that she just like to be able to ask questions and I think I'm answering them whenever I'm  asked, yet she does not seem satisfied with the care I provide  .  RTC 8 months.

## 2017-08-23 NOTE — Addendum Note (Signed)
Addended byDamita Dunnings D on: 08/23/2017 07:58 AM   Modules accepted: Orders

## 2017-08-25 LAB — TESTOS,TOTAL,FREE AND SHBG (FEMALE)
FREE TESTOSTERONE: 30.3 pg/mL — AB (ref 35.0–155.0)
Sex Hormone Binding: 27 nmol/L (ref 22–77)
Testosterone, Total, LC-MS-MS: 171 ng/dL — ABNORMAL LOW (ref 250–1100)

## 2017-08-25 LAB — PAIN MGMT, PROFILE 8 W/CONF, U
6 ACETYLMORPHINE: NEGATIVE ng/mL (ref <10)
ALPHAHYDROXYALPRAZOLAM: NEGATIVE ng/mL (ref <25)
AMINOCLONAZEPAM: 133 ng/mL — AB (ref <25)
AMPHETAMINES: NEGATIVE ng/mL (ref <500)
Alcohol Metabolites: POSITIVE ng/mL — AB (ref <500)
Alphahydroxymidazolam: NEGATIVE ng/mL (ref <50)
Alphahydroxytriazolam: NEGATIVE ng/mL (ref <50)
BUPRENORPHINE, URINE: NEGATIVE ng/mL (ref <5)
Benzodiazepines: POSITIVE ng/mL — AB (ref <100)
Cocaine Metabolite: NEGATIVE ng/mL (ref <150)
Creatinine: 110.6 mg/dL
Ethyl Glucuronide (ETG): 65994 ng/mL — ABNORMAL HIGH (ref <500)
Ethyl Sulfate (ETS): 11371 ng/mL — ABNORMAL HIGH (ref <100)
HYDROXYETHYLFLURAZEPAM: NEGATIVE ng/mL (ref <50)
Lorazepam: NEGATIVE ng/mL (ref <50)
MARIJUANA METABOLITE: NEGATIVE ng/mL (ref <20)
MDMA: NEGATIVE ng/mL (ref <500)
Nordiazepam: NEGATIVE ng/mL (ref <50)
OXIDANT: NEGATIVE ug/mL (ref <200)
Opiates: NEGATIVE ng/mL (ref <100)
Oxazepam: NEGATIVE ng/mL (ref <50)
Oxycodone: NEGATIVE ng/mL (ref <100)
TEMAZEPAM: NEGATIVE ng/mL (ref <50)
pH: 8.05 (ref 4.5–9.0)

## 2017-08-25 LAB — HEPATITIS C ANTIBODY
HEP C AB: NONREACTIVE
SIGNAL TO CUT-OFF: 0.01 (ref <1.00)

## 2017-09-06 ENCOUNTER — Other Ambulatory Visit: Payer: Self-pay | Admitting: Endocrinology

## 2017-09-10 ENCOUNTER — Telehealth: Payer: Self-pay | Admitting: Internal Medicine

## 2017-09-10 NOTE — Telephone Encounter (Signed)
Advise patient, potassium was elevated, due for a BMP. DX hyperkalemia

## 2017-09-11 NOTE — Telephone Encounter (Signed)
MyChart message sent to Pt. BMP ordered on 08/23/2017.

## 2017-09-12 ENCOUNTER — Other Ambulatory Visit: Payer: Self-pay

## 2017-09-12 ENCOUNTER — Encounter: Payer: Self-pay | Admitting: Endocrinology

## 2017-09-14 ENCOUNTER — Telehealth: Payer: Self-pay | Admitting: Endocrinology

## 2017-09-14 NOTE — Telephone Encounter (Signed)
Patient almost out of medication. Please send prescription for the medication prescribed by Dr. Dwyane Dee [facility] ph# [phone]

## 2017-09-17 ENCOUNTER — Other Ambulatory Visit: Payer: Self-pay

## 2017-09-17 MED ORDER — TESTOSTERONE 30 MG/ACT TD SOLN: TRANSDERMAL | 2 refills | Status: DC

## 2017-09-17 NOTE — Telephone Encounter (Signed)
I have printed out this prescription for testosterone and placed on Dr. Jodelle Green desk to sign.

## 2017-09-25 NOTE — Telephone Encounter (Signed)
Caller Name:  Bobby [name]  Best Number: [phone]  [facility] [address]  Reason for call: Would like to change his prescription to androgel, his current prescription is going to cost $600.  Please change to androgel if possible.  He will be out of medication before Monday 10/01/17 appointment

## 2017-09-27 ENCOUNTER — Other Ambulatory Visit: Payer: Self-pay

## 2017-09-27 MED ORDER — TESTOSTERONE 30 MG/ACT TD SOLN: TRANSDERMAL | 2 refills | Status: DC

## 2017-09-27 NOTE — Telephone Encounter (Signed)
Patient stated the pharmacy haven't received his medication for the androgel, he is very upset said he has called about this 4 times. He is contemplating finding another doctor.   He is totally out, please send it in to his pharmacy

## 2017-09-28 ENCOUNTER — Other Ambulatory Visit: Payer: Self-pay | Admitting: Internal Medicine

## 2017-09-28 NOTE — Telephone Encounter (Signed)
Rx faxed to [facility].  

## 2017-09-28 NOTE — Telephone Encounter (Signed)
Pt is requesting refill on clonazepam 0.5mg .  Last OV: 08/21/2017 Last Fill: 04/18/2017 #60 and 3RF UDS: 08/21/2017 Low risk  NCCR printed; no issues noted  Please advise.

## 2017-09-28 NOTE — Telephone Encounter (Signed)
Rx printed, awaiting MD signature.  

## 2017-09-28 NOTE — Telephone Encounter (Signed)
Okay 60, 3 refills. Also, needs BMP.  Please arrange

## 2017-10-01 ENCOUNTER — Other Ambulatory Visit: Payer: Self-pay

## 2017-10-01 ENCOUNTER — Encounter: Payer: Self-pay | Admitting: Endocrinology

## 2017-10-01 ENCOUNTER — Ambulatory Visit (INDEPENDENT_AMBULATORY_CARE_PROVIDER_SITE_OTHER): Payer: Medicare Other | Admitting: Endocrinology

## 2017-10-01 VITALS — BP 136/86 | HR 64 | Ht 72.0 in | Wt 188.8 lb

## 2017-10-01 DIAGNOSIS — E291 Testicular hypofunction: Secondary | ICD-10-CM | POA: Diagnosis not present

## 2017-10-01 MED ORDER — TESTOSTERONE 30 MG/ACT TD SOLN: TRANSDERMAL | 2 refills | Status: DC

## 2017-10-01 NOTE — Progress Notes (Signed)
Patient ID: Bobby [name], male   DOB: Dec 12, 1950, 66 y.o.   MRN: [id number]          Chief complaint: Low testosterone, follow-up  History of Present Illness  The patient was found to have hypogonadism when he was having symptoms of decreased libido, decreased motivation but no significant fatigue.  He was also concerned because of family History of hypogonadism and his brother The exam showed left testicle 3-3.5 cm and right testicle 3 cm.Both testicles are relatively soft   Prior lab results show baselinetestosterone level of 307, done twice in 5/16 Free testosterone was slightly low on the first test but low normal at 6.9 on the second test  He has been on testosterone supplementation starting in 7/16 with Axiron which previously has been the preferred drug on his plan With testosterone supplementation his libido, motivation and energy has previously improved  On a previous visit he was told to use the Axiron only 1 pump a day since even with not taking his medication daily his level was over 500  However he now states that he still  He has had variable compliance with applying the Axiron, because of not taking it daily and forgetting to do it he sometimes uses one pump on each arm   He has not had the prescription filled and is having difficulty with insurance coverage and his level is quite low but not getting his medication regularly Although he is not complaining of excessive fatigue is mostly concerned about improved libido which does occur when he is taking the testosterone gel  Lab Results  Component Value Date   TESTOSTERONE 170.98 (L) 05/09/2017   TESTOSTERONE 403.28 11/06/2016   TESTOSTERONE 514.76 05/01/2016   TESTOSTERONE 492 10/26/2015    Previous labs: Prolactin level: 7.8 LH level: 3.9    Wt Readings from Last 3 Encounters:  10/01/17 188 lb 12.8 oz (85.6 kg)  08/21/17 188 lb 2 oz (85.3 kg)  05/09/17 193 lb (87.5 kg)    Allergies as of  10/01/2017      Reactions   Tramadol    REACTION: hives      Medication List        Accurate as of 10/01/17  4:27 PM. Always use your most recent med list.          carvedilol 6.25 MG tablet Commonly known as:  COREG Take 1 tablet (6.25 mg total) by mouth 2 (two) times daily with a meal.   clonazePAM 0.5 MG tablet Commonly known as:  KLONOPIN Take 1 tablet (0.5 mg total) 2 (two) times daily as needed by mouth for anxiety.   folic acid 1 MG tablet Commonly known as:  FOLVITE Take 1 tablet (1 mg total) by mouth daily.   multivitamin with minerals Tabs tablet Take 1 tablet by mouth daily.   Testosterone 30 MG/ACT Soln USE 1 PUMP UNDER EACH ARM DAILY   thiamine 100 MG tablet Take 1 tablet (100 mg total) by mouth daily.   zolpidem 10 MG tablet Commonly known as:  AMBIEN Take 1 tablet (10 mg total) by mouth at bedtime as needed for sleep.       Allergies:  Allergies  Allergen Reactions  . Tramadol     REACTION: hives    Past Medical History:  Diagnosis Date  . Anxiety and depression   . Hypertension   . Hyponatremia 03-2013   ongoing, seeing Dr. Posey Pronto  . Insomnia   . SIADH (syndrome of inappropriate ADH production) (  [place]) 2017   Dr. Posey Pronto    Past Surgical History:  Procedure Laterality Date  . SHOULDER SURGERY  1980s   right shoulder    Family History  Problem Relation Age of Onset  . Hypertension Mother   . Stroke Mother   . Diabetes Mother   . Emphysema Mother        smoked  . Colon cancer Neg Hx   . Prostate cancer Neg Hx   . CAD Neg Hx     Social History:  reports that  has never smoked. he has never used smokeless tobacco. He reports that he drinks alcohol. He reports that he does not use drugs.  ROS    PSA Followed by PCP   Lab Results  Component Value Date   PSA 2.33 08/21/2017   PSA 2.97 06/21/2016   PSA 2.34 03/08/2016     HYPERKALEMIA: His potassium is unusually high and he has not been advised on a low potassium  diet Currently not on any nonsteroidal anti-inflammatory drugs  General Examination:   BP 136/86   Pulse 64   Ht 6' (1.829 m)   Wt 188 lb 12.8 oz (85.6 kg)   SpO2 98%   BMI 25.61 kg/m    Assessment/ Plan:   Hypogonadotropic hypogonadism, mild He was diagnosed with a level of low free testosterone Subjectively he feels better when he has testosterone supplementation  with improved motivation and energy More recently without adequate testosterone supplementation he is mostly concerned about decreased libido  Partly because of his insurance difficulties with approval he has not uses testosterone and will try to get this again For now he will try one pump daily Discussed need to adjust the dose based on serum testosterone level to be done in 6 weeks  However he needs to try to apply this daily, preferably in the morning before getting dressed and allowing it to dry  HYPERKALEMIA: Given him a low potassium diet and he will need to follow-up with his PCP about this  There are no Patient Instructions on file for this visit.   [name] 10/01/2017, 4:27 PM

## 2017-10-05 ENCOUNTER — Other Ambulatory Visit (INDEPENDENT_AMBULATORY_CARE_PROVIDER_SITE_OTHER): Payer: Medicare Other

## 2017-10-05 ENCOUNTER — Telehealth: Payer: Self-pay

## 2017-10-05 DIAGNOSIS — E875 Hyperkalemia: Secondary | ICD-10-CM

## 2017-10-05 LAB — BASIC METABOLIC PANEL
BUN: 18 mg/dL (ref 6–23)
CO2: 30 mEq/L (ref 19–32)
Calcium: 9.4 mg/dL (ref 8.4–10.5)
Chloride: 97 mEq/L (ref 96–112)
Creatinine, Ser: 0.96 mg/dL (ref 0.40–1.50)
GFR: 82.67 mL/min (ref >60.00)
Glucose, Bld: 95 mg/dL (ref 70–99)
POTASSIUM: 4.8 meq/L (ref 3.5–5.1)
SODIUM: 135 meq/L (ref 135–145)

## 2017-10-05 NOTE — Telephone Encounter (Signed)
Called patient and let him know that I have contacted the pharmacy and they will contact him when ready.

## 2017-10-05 NOTE — Telephone Encounter (Signed)
Patient called today and is requesting to be put on the injectable testosterone because he can not afford the gel- needs patient education on how to do injection- [facility] [address]

## 2017-10-05 NOTE — Telephone Encounter (Signed)
I would prefer that the due to the custom formulated testosterone preparation from [facility]..  They can do the equivalent amount of AndroGel one pump daily.  May need to call them to order prescription

## 2017-10-05 NOTE — Telephone Encounter (Signed)
Called patient and let him know that I am going to reach out to [facility] and see if they can do the equivalent to Androgel and see what the cost will be for him. One of [place] will call him and advise.

## 2017-10-05 NOTE — Telephone Encounter (Signed)
I called Bobby [name] at [phone] and spoke to a man, the pharmacist, and he stated that he would do the  50mg /ml using AT Gel daily. May need to bump up to 100mg  if we need to.

## 2017-10-17 ENCOUNTER — Telehealth: Payer: Self-pay | Admitting: Endocrinology

## 2017-10-17 ENCOUNTER — Telehealth: Payer: Self-pay

## 2017-10-17 NOTE — Telephone Encounter (Signed)
Pt states his medication is working and it is cheaper. He would like to discuss further with Lattie Haw. Please return his call to him mobile number.

## 2017-10-17 NOTE — Telephone Encounter (Signed)
Left vm letting patient know that he has been approved for his testosterone- I also wanted to know if he needed me to send a new prescription in to his pharmacy

## 2017-10-18 NOTE — Telephone Encounter (Signed)
Patient would like to continue taking the axiron and he will discuss this with Dr. Dwyane Dee at his next visit

## 2017-11-01 ENCOUNTER — Ambulatory Visit: Payer: Self-pay | Admitting: Neurology

## 2017-11-07 ENCOUNTER — Other Ambulatory Visit: Payer: Self-pay | Admitting: Internal Medicine

## 2017-11-07 ENCOUNTER — Telehealth: Payer: Self-pay

## 2017-11-07 MED ORDER — ZOLPIDEM TARTRATE 10 MG PO TABS: 10.0000 mg | ORAL_TABLET | Freq: Every evening | ORAL | 1 refills | Status: DC | PRN

## 2017-11-07 NOTE — Telephone Encounter (Signed)
Pt is requesting refill on Ambien 10mg .  Last OV: 08/21/2017 Last Fill: 04/26/2017 #90 and 1RF UDS: 08/21/2017 Low risk  NCCR printed, no issues noted  Please advise.

## 2017-11-07 NOTE — Telephone Encounter (Addendum)
Sent!

## 2017-11-07 NOTE — Telephone Encounter (Signed)
Copied from [place]. Topic: General - Other >> Nov 07, 2017  1:58 PM Bobby [name], [place] wrote: Reason for CRM: pt would like a refill for zolpidem 10mg  [facility] [address]

## 2017-11-08 ENCOUNTER — Ambulatory Visit: Payer: BLUE CROSS/BLUE SHIELD | Admitting: Endocrinology

## 2017-11-09 ENCOUNTER — Other Ambulatory Visit: Payer: BLUE CROSS/BLUE SHIELD

## 2017-12-24 ENCOUNTER — Encounter: Payer: Self-pay | Admitting: Neurology

## 2017-12-24 ENCOUNTER — Ambulatory Visit (INDEPENDENT_AMBULATORY_CARE_PROVIDER_SITE_OTHER): Payer: Medicare Other | Admitting: Neurology

## 2017-12-24 VITALS — BP 148/90 | HR 64 | Ht 72.0 in | Wt 191.8 lb

## 2017-12-24 DIAGNOSIS — R410 Disorientation, unspecified: Secondary | ICD-10-CM | POA: Diagnosis not present

## 2017-12-24 NOTE — Progress Notes (Signed)
PATIENT: Bobby [name] DOB: [birth year]  Chief Complaint  Patient presents with  . Episodic Confusion Spell    His wife feels he had a day where he seemed confused but he disagrees.  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 He had one event of confusion in 03/2013 where he was evaluated at the hospital and found to have low sodium.  Marland Kitchen PCP    Colon Branch, MD     HISTORICAL  Bobby [name] is a 67 year old male, seen in refer by primary care doctor Kathlene November for evaluation of confusion spells, initial evaluation was on December 24, 2017.  He had one episode of acute onset confusion in May 2014, evaluated at emergency room, laboratory evaluation showed hyponatremia with sodium of 119, no clear etiology found per patient, MRI of the brain then showed mild generalized atrophy.  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 Tramadol     REACTION: hives    HOME MEDICATIONS: Current Outpatient Medications  Medication Sig Dispense Refill  . carvedilol (COREG) 6.25 MG tablet Take 1 tablet (6.25 mg total) by mouth 2 (two) times daily with a meal. 180 tablet 1  . clonazePAM (KLONOPIN) 0.5 MG tablet Take 1 tablet (0.5 mg total) 2 (two) times daily as needed by mouth for anxiety. 60 tablet 3  . folic acid (FOLVITE) 1 MG tablet Take 1 tablet (1 mg total) by mouth daily. 90 tablet 2  .  Multiple Vitamin (MULTIVITAMIN WITH MINERALS) TABS Take 1 tablet by mouth daily.    . Testosterone 30 MG/ACT SOLN USE 1 PUMP UNDER EACH ARM DAILY 270 mL 2  . thiamine 100 MG tablet Take 1 tablet (100 mg total) by mouth daily. 90 tablet 2  . zolpidem (AMBIEN) 10 MG tablet Take 1 tablet (10 mg total) by mouth at bedtime as needed for sleep. 90 tablet 1   No current facility-administered medications for this visit.     PAST MEDICAL HISTORY: Past Medical History:  Diagnosis Date  . Anxiety and depression   . Hypertension   . Hyponatremia 03-2013   ongoing, seeing Dr. Posey Pronto  . Insomnia   . SIADH (syndrome of inappropriate ADH production) ([place]) 2017   Dr. Posey Pronto    PAST SURGICAL HISTORY: Past Surgical History:  Procedure Laterality Date  . SHOULDER SURGERY  1980s   right shoulder    FAMILY HISTORY: Family History  Problem Relation Age of Onset  . Hypertension Mother   . Stroke Mother   . Diabetes Mother   . Emphysema Mother        smoked  . Thyroid disease Mother   . Alcoholism Father   . Colon cancer Neg Hx   . Prostate cancer Neg Hx   . CAD Neg Hx     SOCIAL HISTORY:  Social History   Socioeconomic History  . Marital status: Married    Spouse  name: Not on file  . Number of children: 2  . Years of education: 16  . Highest education level: Bachelor's degree (e.g., BA, AB, BS)  Social Needs  . Financial resource strain: Not on file  . Food insecurity - worry: Not on file  . Food insecurity - inability: Not on file  . Transportation needs - medical: Not on file  . Transportation needs - non-medical: Not on file  Occupational History  . Occupation: Armed forces operational officer   Tobacco Use  . Smoking status: Never Smoker  . Smokeless tobacco: Never Used  Substance and Sexual Activity  . Alcohol use: Yes    Alcohol/week: 0.0 oz    Comment: socially , 2 to 3 drinks  a day  . Drug use: No  . Sexual activity: Not on file  Other Topics Concern  . Not on file  Social History  Narrative   [name], Married,    2 children   mom passed away 05-25-08   Right-handed.   2-3 cups coffee per day.   Lives at home with wife.              PHYSICAL EXAM   Vitals:   12/24/17 0751  BP: (!) 148/90  Pulse: 64  Weight: 191 lb 12 oz (87 kg)  Height: 6' (1.829 m)    Not recorded      Body mass index is 26.01 kg/m.  PHYSICAL EXAMNIATION:  Gen: NAD, conversant, well nourised, obese, well groomed                     Cardiovascular: Regular rate rhythm, no peripheral edema, warm, nontender. Eyes: Conjunctivae clear without exudates or hemorrhage Neck: Supple, no carotid bruits. Pulmonary: Clear to auscultation bilaterally   NEUROLOGICAL EXAM: MMSE - Mini Mental State Exam 12/24/2017  Orientation to time 5  Orientation to Place 5  Registration 3  Attention/ Calculation 5  Recall 2  Language- name 2 objects 2  Language- repeat 1  Language- follow 3 step command 3  Language- read & follow direction 1  Write a sentence 1  Copy design 1  Total score 29     CRANIAL NERVES: CN II: Visual fields are full to confrontation. Fundoscopic exam is normal with sharp discs and no vascular changes. Pupils are round equal and briskly reactive to light. CN III, IV, VI: extraocular movement are normal. No ptosis. CN V: Facial sensation is intact to pinprick in all 3 divisions bilaterally. Corneal responses are intact.  CN VII: Face is symmetric with normal eye closure and smile. CN VIII: Hearing is normal to rubbing fingers CN IX, X: Palate elevates symmetrically. Phonation is normal. CN XI: Head turning and shoulder shrug are intact CN XII: Tongue is midline with normal movements and no atrophy.  MOTOR: There is no pronator drift of out-stretched arms. Muscle bulk and tone are normal. Muscle strength is normal.  REFLEXES: Reflexes are 2+ and symmetric at the biceps, triceps, knees, and ankles. Plantar responses are flexor.  SENSORY: Intact to light touch,  pinprick, positional sensation and vibratory sensation are intact in fingers and toes.  COORDINATION: Rapid alternating movements and fine finger movements are intact. There is no dysmetria on finger-to-nose and heel-knee-shin.    GAIT/STANCE: Posture is normal. Gait is steady with normal steps, base, arm swing, and turning. Heel and toe walking are normal. Tandem gait is normal.  Romberg is absent.   DIAGNOSTIC DATA (LABS, IMAGING, TESTING) - I reviewed patient records,  labs, notes, testing and imaging myself where available.   ASSESSMENT AND PLAN  ODEAN FESTER is a 67 y.o. male   Mild cognitive impairment  Differentiation diagnosis including early stage of central nervous system degenerative disorder, previous history of hyponatremia  MRI of the brain without contrast  Laboratory evaluations  Return to clinic in 3 months   Marcial Pacas, M.D. Ph.D.  [facility] [address] [place], [place] [address] Ph: [phone] Fax: [phone]  CC: Colon Branch, MD

## 2017-12-26 ENCOUNTER — Other Ambulatory Visit: Payer: Self-pay

## 2017-12-26 ENCOUNTER — Other Ambulatory Visit (INDEPENDENT_AMBULATORY_CARE_PROVIDER_SITE_OTHER): Payer: Medicare Other

## 2017-12-26 DIAGNOSIS — R410 Disorientation, unspecified: Secondary | ICD-10-CM

## 2017-12-26 DIAGNOSIS — E291 Testicular hypofunction: Secondary | ICD-10-CM

## 2017-12-26 LAB — CBC WITH DIFFERENTIAL/PLATELET
BASOS PCT: 0.7 % (ref 0.0–3.0)
Basophils Absolute: 0.1 10*3/uL (ref 0.0–0.1)
Eosinophils Absolute: 0.2 10*3/uL (ref 0.0–0.7)
Eosinophils Relative: 2.3 % (ref 0.0–5.0)
HCT: 40.5 % (ref 39.0–52.0)
Hemoglobin: 14 g/dL (ref 13.0–17.0)
LYMPHS ABS: 3.3 10*3/uL (ref 0.7–4.0)
Lymphocytes Relative: 40.2 % (ref 12.0–46.0)
MCHC: 34.5 g/dL (ref 30.0–36.0)
MCV: 94.3 fl (ref 78.0–100.0)
MONO ABS: 0.6 10*3/uL (ref 0.1–1.0)
Monocytes Relative: 7.2 % (ref 3.0–12.0)
NEUTROS PCT: 49.6 % (ref 43.0–77.0)
Neutro Abs: 4.1 10*3/uL (ref 1.4–7.7)
Platelets: 225 10*3/uL (ref 150.0–400.0)
RBC: 4.3 Mil/uL (ref 4.22–5.81)
RDW: 13 % (ref 11.5–15.5)
WBC: 8.2 10*3/uL (ref 4.0–10.5)

## 2017-12-26 LAB — TESTOSTERONE: Testosterone: 160.45 ng/dL — ABNORMAL LOW (ref 300.00–890.00)

## 2017-12-26 LAB — COMPREHENSIVE METABOLIC PANEL
ALT: 22 U/L (ref 0–53)
AST: 24 U/L (ref 0–37)
Albumin: 4.3 g/dL (ref 3.5–5.2)
Alkaline Phosphatase: 60 U/L (ref 39–117)
BUN: 17 mg/dL (ref 6–23)
CALCIUM: 9.2 mg/dL (ref 8.4–10.5)
CHLORIDE: 94 meq/L — AB (ref 96–112)
CO2: 32 meq/L (ref 19–32)
Creatinine, Ser: 0.98 mg/dL (ref 0.40–1.50)
GFR: 80.67 mL/min (ref >60.00)
GLUCOSE: 108 mg/dL — AB (ref 70–99)
Potassium: 5.4 mEq/L — ABNORMAL HIGH (ref 3.5–5.1)
SODIUM: 131 meq/L — AB (ref 135–145)
Total Bilirubin: 0.8 mg/dL (ref 0.2–1.2)
Total Protein: 6.9 g/dL (ref 6.0–8.3)

## 2017-12-26 LAB — VITAMIN B12: Vitamin B-12: 508 pg/mL (ref 211–911)

## 2017-12-26 LAB — TSH: TSH: 2.08 u[IU]/mL (ref 0.35–4.50)

## 2017-12-27 ENCOUNTER — Telehealth: Payer: Self-pay | Admitting: Neurology

## 2017-12-27 LAB — RPR: RPR: NONREACTIVE

## 2017-12-27 NOTE — Telephone Encounter (Signed)
If he noticed any increased confusion from baseline, we can have repeat lab sooner.

## 2017-12-27 NOTE — Telephone Encounter (Signed)
Please call patient, sodium was mildly decreased 131, potassium was elevated 5.4,  He should have a follow up visit after brain MRI in March 2019, give him a follow up appt, will have repeat lab then,

## 2017-12-28 NOTE — Telephone Encounter (Signed)
Spoke with pt. and gave below lab results.  He verbalized understanding of same, denies confusion.  Aware labs will be repeated after MRI/fim

## 2018-01-29 ENCOUNTER — Ambulatory Visit: Payer: BLUE CROSS/BLUE SHIELD | Admitting: Endocrinology

## 2018-01-31 ENCOUNTER — Telehealth: Payer: Self-pay | Admitting: *Deleted

## 2018-01-31 ENCOUNTER — Encounter: Payer: Self-pay | Admitting: Endocrinology

## 2018-01-31 ENCOUNTER — Ambulatory Visit (INDEPENDENT_AMBULATORY_CARE_PROVIDER_SITE_OTHER): Payer: Medicare Other | Admitting: Endocrinology

## 2018-01-31 VITALS — BP 130/84 | HR 69 | Wt 191.0 lb

## 2018-01-31 DIAGNOSIS — E291 Testicular hypofunction: Secondary | ICD-10-CM | POA: Diagnosis not present

## 2018-01-31 NOTE — Progress Notes (Signed)
Patient ID: Bobby [name], male   DOB: 1951-02-21, 67 y.o.   MRN: [id number]          Chief complaint: Low testosterone, follow-up  History of Present Illness  The patient was found to have hypogonadism when he was having symptoms of decreased libido, decreased motivation but no significant fatigue.  He was also concerned because of family History of hypogonadism and his brother The exam showed left testicle 3-3.5 cm and right testicle 3 cm.Both testicles are relatively soft   Prior lab results show baselinetestosterone level of 307, done twice in 5/16 Free testosterone was slightly low on the first test but low normal at 6.9 on the second test  He has been on testosterone supplementation starting in 7/16 with Axiron which previously has been the preferred drug on his plan With testosterone supplementation his libido, motivation and energy has previously improved  On a previous visit he was told to use the Axiron only 1 pump a day since even with not taking his medication daily his level was over 500 However subsequently has had difficulty keeping his testosterone levels normal since 2018  In late 2018 he had difficulty affording his Axiron and other brand name testosterone products Also has had variable compliance with applying testosterone supplements forgetting frequently  Without his supplement his testosterone level is down to 171/18 He is now taking for the last 3 months or so compounded testosterone gel [facility] and is using 1% preparation 2 pumps a day, one on each arm  Testosterone level is low at 160 although he may have forgotten his medication a couple of days prior to his lab However he is not having any symptoms of unusual fatigue or significantly decreased libido currently   Lab Results  Component Value Date   TESTOSTERONE 160.45 (L) 12/26/2017   TESTOSTERONE 170.98 (L) 05/09/2017   TESTOSTERONE 403.28 11/06/2016   TESTOSTERONE 514.76  05/01/2016   TESTOSTERONE 492 10/26/2015    Previous labs: Prolactin level: 7.8 LH level: 3.9    Wt Readings from Last 3 Encounters:  01/31/18 191 lb (86.6 kg)  12/24/17 191 lb 12 oz (87 kg)  10/01/17 188 lb 12.8 oz (85.6 kg)    Allergies as of 01/31/2018      Reactions   Tramadol    REACTION: hives      Medication List        Accurate as of 01/31/18  3:59 PM. Always use your most recent med list.          carvedilol 6.25 MG tablet Commonly known as:  COREG Take 1 tablet (6.25 mg total) by mouth 2 (two) times daily with a meal.   clonazePAM 0.5 MG tablet Commonly known as:  KLONOPIN Take 1 tablet (0.5 mg total) 2 (two) times daily as needed by mouth for anxiety.   folic acid 1 MG tablet Commonly known as:  FOLVITE Take 1 tablet (1 mg total) by mouth daily.   multivitamin with minerals Tabs tablet Take 1 tablet by mouth daily.   Testosterone 30 MG/ACT Soln USE 1 PUMP UNDER EACH ARM DAILY   thiamine 100 MG tablet Take 1 tablet (100 mg total) by mouth daily.   zolpidem 10 MG tablet Commonly known as:  AMBIEN Take 1 tablet (10 mg total) by mouth at bedtime as needed for sleep.       Allergies:  Allergies  Allergen Reactions  . Tramadol     REACTION: hives    Past  Medical History:  Diagnosis Date  . Anxiety and depression   . Hypertension   . Hyponatremia 03-2013   ongoing, seeing Dr. Posey Pronto  . Insomnia   . SIADH (syndrome of inappropriate ADH production) ([employer]) 2017   Dr. Posey Pronto    Past Surgical History:  Procedure Laterality Date  . SHOULDER SURGERY  1980s   right shoulder    Family History  Problem Relation Age of Onset  . Hypertension Mother   . Stroke Mother   . Diabetes Mother   . Emphysema Mother        smoked  . Thyroid disease Mother   . Alcoholism Father   . Colon cancer Neg Hx   . Prostate cancer Neg Hx   . CAD Neg Hx     Social History:  reports that  has never smoked. he has never used smokeless tobacco. He reports  that he drinks alcohol. He reports that he does not use drugs.  ROS    PSA has been consistently normal, followed by PCP   Lab Results  Component Value Date   PSA 2.33 08/21/2017   PSA 2.97 06/21/2016   PSA 2.34 03/08/2016     General Examination:   BP 130/84 (BP Location: Left Arm, Patient Position: Sitting, Cuff Size: Normal)   Pulse 69   Wt 191 lb (86.6 kg)   SpO2 97%   BMI 25.90 kg/m    Assessment/ Plan:   Hypogonadotropic hypogonadism, relatively mild symptoms  He was diagnosed with a level of low free testosterone  More recently has had difficulty getting consistent compliance with his medication and also affording brand name testosterone gels  Currently is using the custom formulated testosterone gel, 1 pump on each arm Surprisingly however his testosterone level is only 160 even though he has taken regularly, does not remember if he had forgotten to tell a couple of days prior to his lab  He does want to continue the testosterone supplementation despite inconsistent response to treatment and nonspecific symptoms which are mild  He was given the option of using Testopel but he prefers to continue with the [facility] preparation  Once his labs were done over a month ago he will try to use the medication daily for the next week and follow-up next week for another level  There are no Patient Instructions on file for this visit.   Elayne Snare 01/31/2018, 3:59 PM

## 2018-01-31 NOTE — Telephone Encounter (Signed)
Per Dr. Dwyane Dee I gave verbal Josem Kaufmann for Testosterone AT gel 50 mg/ml. Apply 1 ml (4 clicks) qam. # 30 ml with 3 refills.

## 2018-02-18 ENCOUNTER — Ambulatory Visit (HOSPITAL_BASED_OUTPATIENT_CLINIC_OR_DEPARTMENT_OTHER)
Admission: RE | Admit: 2018-02-18 | Discharge: 2018-02-18 | Disposition: A | Discharge status: Home / Self Care | Payer: Medicare Other | Source: Ambulatory Visit | Attending: Internal Medicine | Admitting: Internal Medicine

## 2018-02-18 ENCOUNTER — Ambulatory Visit (INDEPENDENT_AMBULATORY_CARE_PROVIDER_SITE_OTHER): Payer: Medicare Other | Admitting: Internal Medicine

## 2018-02-18 ENCOUNTER — Encounter: Payer: Self-pay | Admitting: Internal Medicine

## 2018-02-18 VITALS — BP 128/78 | HR 57 | Temp 97.8°F | Resp 14 | Ht 72.0 in | Wt 192.2 lb

## 2018-02-18 DIAGNOSIS — M1611 Unilateral primary osteoarthritis, right hip: Secondary | ICD-10-CM | POA: Diagnosis not present

## 2018-02-18 DIAGNOSIS — E291 Testicular hypofunction: Secondary | ICD-10-CM

## 2018-02-18 DIAGNOSIS — M25551 Pain in right hip: Secondary | ICD-10-CM | POA: Insufficient documentation

## 2018-02-18 DIAGNOSIS — E222 Syndrome of inappropriate secretion of antidiuretic hormone: Secondary | ICD-10-CM

## 2018-02-18 DIAGNOSIS — E871 Hypo-osmolality and hyponatremia: Secondary | ICD-10-CM | POA: Diagnosis not present

## 2018-02-18 LAB — BASIC METABOLIC PANEL
BUN: 18 mg/dL (ref 6–23)
CO2: 31 mEq/L (ref 19–32)
CREATININE: 0.92 mg/dL (ref 0.40–1.50)
Calcium: 9.3 mg/dL (ref 8.4–10.5)
Chloride: 91 mEq/L — ABNORMAL LOW (ref 96–112)
GFR: 86.74 mL/min (ref >60.00)
GLUCOSE: 108 mg/dL — AB (ref 70–99)
POTASSIUM: 4.8 meq/L (ref 3.5–5.1)
Sodium: 128 mEq/L — ABNORMAL LOW (ref 135–145)

## 2018-02-18 LAB — TESTOSTERONE: Testosterone: 190.78 ng/dL — ABNORMAL LOW (ref 300.00–890.00)

## 2018-02-18 NOTE — Progress Notes (Signed)
Subjective:    Patient ID: Bobby [name], male    DOB: 16-Sep-1951, 67 y.o.   MRN: [id number]  DOS:  02/18/2018 Type of visit - description : acute Interval history: Symptoms of started 4 days ago, pain at the right buttock, no radiation, feels like a catch, on and off, worse if he walks.  Denies pain in the actual lumbosacral area. Has not taking any medication for this condition Hypogonadism: Note from Endo reviewed MCI: Note from neurology reviewed  Review of Systems No fever chills No rash No recent injury or fall.   Past Medical History:  Diagnosis Date  . Anxiety and depression   . Hypertension   . Hyponatremia 03-2013   ongoing, seeing Dr. Posey Pronto  . Insomnia   . SIADH (syndrome of inappropriate ADH production) ([place]) 2017   Dr. Posey Pronto    Past Surgical History:  Procedure Laterality Date  . SHOULDER SURGERY  1980s   right shoulder    Social History   Socioeconomic History  . Marital status: Married    Spouse name: Not on file  . Number of children: 2  . Years of education: 16  . Highest education level: Bachelor's degree (e.g., BA, AB, BS)  Occupational History  . Occupation: Armed forces operational officer   Social Needs  . Financial resource strain: Not on file  . Food insecurity:    Worry: Not on file    Inability: Not on file  . Transportation needs:    Medical: Not on file    Non-medical: Not on file  Tobacco Use  . Smoking status: Never Smoker  . Smokeless tobacco: Never Used  Substance and Sexual Activity  . Alcohol use: Yes    Alcohol/week: 0.0 oz    Comment: socially , 2 to 3 drinks  a day  . Drug use: No  . Sexual activity: Not on file  Lifestyle  . Physical activity:    Days per week: Not on file    Minutes per session: Not on file  . Stress: Not on file  Relationships  . Social connections:    Talks on phone: Not on file    Gets together: Not on file    Attends religious service: Not on file    Active member of club or organization: Not on file     Attends meetings of clubs or organizations: Not on file    Relationship status: Not on file  . Intimate partner violence:    Fear of current or ex partner: Not on file    Emotionally abused: Not on file    Physically abused: Not on file    Forced sexual activity: Not on file  Other Topics Concern  . Not on file  Social History Narrative   [name], Married,    2 children   mom passed away 06/06/08   Right-handed.   2-3 cups coffee per day.   Lives at home with wife.               Allergies as of 02/18/2018      Reactions   Tramadol    REACTION: hives      Medication List        Accurate as of 02/18/18 11:59 PM. Always use your most recent med list.          carvedilol 6.25 MG tablet Commonly known as:  COREG Take 1 tablet (6.25 mg total) by mouth 2 (two) times daily with a meal.   clonazePAM  0.5 MG tablet Commonly known as:  KLONOPIN Take 1 tablet (0.5 mg total) 2 (two) times daily as needed by mouth for anxiety.   folic acid 1 MG tablet Commonly known as:  FOLVITE Take 1 tablet (1 mg total) by mouth daily.   multivitamin with minerals Tabs tablet Take 1 tablet by mouth daily.   Testosterone 30 MG/ACT Soln USE 1 PUMP UNDER EACH ARM DAILY   thiamine 100 MG tablet Take 1 tablet (100 mg total) by mouth daily.   zolpidem 10 MG tablet Commonly known as:  AMBIEN Take 1 tablet (10 mg total) by mouth at bedtime as needed for sleep.          Objective:   Physical Exam  Skin:      BP 128/78 (BP Location: Left Arm, Patient Position: Sitting, Cuff Size: Small)   Pulse (!) 57   Temp 97.8 F (36.6 C) (Oral)   Resp 14   Ht 6' (1.829 m)   Wt 192 lb 4 oz (87.2 kg)   SpO2 98%   BMI 26.07 kg/m  General:   Well developed, well nourished . NAD.  HEENT:  Normocephalic . Face symmetric, atraumatic MSK: No TTP at the lumbosacral area or trochanteric bursa's. Hip rotation: Normal, symmetric, did not complain of pain. Straight leg test negative Skin: Not  pale. Not jaundice Neurologic:  alert & oriented X3.  Speech normal, gait appropriate for age and unassisted.  Motor and DTRs symmetric Psych--  Cognition and judgment appear intact.  Cooperative with normal attention span and concentration.  Behavior appropriate. No anxious or depressed appearing.      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 [facility]CI per neuro visit 12/2017 Hyponatremia onset 2014, admitted. 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  GI precautions discussed, not to take ibuprofen daily.  If not better call for referral. Hyponatremia, mild hyperkalemia: Noted on recent labs.  Recheck a BMP Hypogonadism: Per Dr Raliegh Ip, last visit few days ago.  Patient was supposed to have testosterone check.  He reports good compliance with HRT.  Will check today Episodic confusion: Saw neurology 12/24/2017, DX what [facility].  RPR, B12, TSH normal.  Brain MRI not pursue. RTC June as a schedule

## 2018-02-18 NOTE — Progress Notes (Signed)
Pre visit review using our clinic review tool, if applicable. No additional management support is needed unless otherwise documented below in the visit note. 

## 2018-02-18 NOTE — Patient Instructions (Addendum)
GO TO THE LAB : Get the blood work     STOP BY THE FIRST FLOOR:  get the XR   Tylenol  500 mg OTC 2 tabs a day every 8 hours as needed for pain  IBUPROFEN (Advil or Motrin) 200 mg 2 tablets every 6 hours as needed for pain.  Always take it with food because may cause gastritis and ulcers.  If you notice nausea, stomach pain, change in the color of stools --->  Stop the medicine and let us know

## 2018-02-19 ENCOUNTER — Telehealth: Payer: Self-pay | Admitting: *Deleted

## 2018-02-19 NOTE — Assessment & Plan Note (Signed)
Hip pain: Suspect pain is generated by the hip, will get a x-ray, recommend Tylenol and occasional ibuprofen.  GI precautions discussed, not to take ibuprofen daily.  If not better call for referral. Hyponatremia, mild hyperkalemia: Noted on recent labs.  Recheck a BMP Hypogonadism: Per Dr Raliegh Ip, last visit few days ago.  Patient was supposed to have testosterone check.  He reports good compliance with HRT.  Will check today Episodic confusion: Saw neurology 12/24/2017, DX what [facility].  RPR, B12, TSH normal.  Brain MRI not pursue. RTC June as a schedule

## 2018-02-19 NOTE — Telephone Encounter (Signed)
Pt. Informed of below. He is requesting a new Testosterone RX. Please advise.    Notes recorded by Elayne Snare, MD on 02/19/2018 at 8:07 AM EDT Testosterone level is still low, need to increase the testosterone gel to 2 pumps on each arm instead of 1 pump, need follow-up in about 2 months with labs

## 2018-02-20 DIAGNOSIS — M25551 Pain in right hip: Secondary | ICD-10-CM | POA: Diagnosis not present

## 2018-02-20 DIAGNOSIS — M9905 Segmental and somatic dysfunction of pelvic region: Secondary | ICD-10-CM | POA: Diagnosis not present

## 2018-02-20 DIAGNOSIS — M5137 Other intervertebral disc degeneration, lumbosacral region: Secondary | ICD-10-CM | POA: Diagnosis not present

## 2018-02-20 DIAGNOSIS — M9903 Segmental and somatic dysfunction of lumbar region: Secondary | ICD-10-CM | POA: Diagnosis not present

## 2018-02-21 NOTE — Telephone Encounter (Signed)
I called pharmacy and they state In the past he was given  Testosterone  5% gel apply 1 ml (4 clicks) once daily.   What would you like for him to have now?

## 2018-02-21 NOTE — Telephone Encounter (Signed)
He is using the custom formulated testosterone gel and this will be called in by phone

## 2018-02-22 NOTE — Telephone Encounter (Signed)
He had told me he was doing only 2 clicks a day, please confirm

## 2018-02-22 NOTE — Telephone Encounter (Signed)
Left mess for patient to call back.  

## 2018-02-25 MED ORDER — NONFORMULARY OR COMPOUNDED ITEM: 1 refills | Status: DC

## 2018-02-25 NOTE — Telephone Encounter (Signed)
Increase dosage to 3 clicks on each arm every morning

## 2018-02-25 NOTE — Telephone Encounter (Signed)
Yes 4

## 2018-02-25 NOTE — Telephone Encounter (Signed)
Correct, he is using 2 clicks on each arm, so 4 clicks total daily. Please advise

## 2018-02-25 NOTE — Telephone Encounter (Signed)
New Rx phoned in. See meds. Pt informed

## 2018-03-05 DIAGNOSIS — M5137 Other intervertebral disc degeneration, lumbosacral region: Secondary | ICD-10-CM | POA: Diagnosis not present

## 2018-03-05 DIAGNOSIS — M9905 Segmental and somatic dysfunction of pelvic region: Secondary | ICD-10-CM | POA: Diagnosis not present

## 2018-03-05 DIAGNOSIS — M9903 Segmental and somatic dysfunction of lumbar region: Secondary | ICD-10-CM | POA: Diagnosis not present

## 2018-03-05 DIAGNOSIS — M25551 Pain in right hip: Secondary | ICD-10-CM | POA: Diagnosis not present

## 2018-03-07 ENCOUNTER — Encounter: Payer: Self-pay | Admitting: Internal Medicine

## 2018-03-07 ENCOUNTER — Encounter: Payer: Self-pay | Admitting: Medical

## 2018-03-07 ENCOUNTER — Ambulatory Visit (INDEPENDENT_AMBULATORY_CARE_PROVIDER_SITE_OTHER): Payer: Medicare Other | Admitting: Medical

## 2018-03-07 VITALS — BP 135/90 | HR 66 | Temp 98.2°F | Resp 16 | Ht 72.0 in | Wt 192.8 lb

## 2018-03-07 DIAGNOSIS — H1013 Acute atopic conjunctivitis, bilateral: Secondary | ICD-10-CM

## 2018-03-07 DIAGNOSIS — J301 Allergic rhinitis due to pollen: Secondary | ICD-10-CM | POA: Diagnosis not present

## 2018-03-07 DIAGNOSIS — M9905 Segmental and somatic dysfunction of pelvic region: Secondary | ICD-10-CM | POA: Diagnosis not present

## 2018-03-07 DIAGNOSIS — M25551 Pain in right hip: Secondary | ICD-10-CM | POA: Diagnosis not present

## 2018-03-07 DIAGNOSIS — M9903 Segmental and somatic dysfunction of lumbar region: Secondary | ICD-10-CM | POA: Diagnosis not present

## 2018-03-07 DIAGNOSIS — M5137 Other intervertebral disc degeneration, lumbosacral region: Secondary | ICD-10-CM | POA: Diagnosis not present

## 2018-03-07 MED ORDER — LEVOCETIRIZINE DIHYDROCHLORIDE 5 MG PO TABS: 5.0000 mg | ORAL_TABLET | Freq: Every evening | ORAL | 0 refills | Status: DC

## 2018-03-07 MED ORDER — OLOPATADINE HCL 0.1 % OP SOLN: 1.0000 [drp] | Freq: Two times a day (BID) | OPHTHALMIC | 12 refills | Status: DC

## 2018-03-07 NOTE — Patient Instructions (Signed)
By exam today and recent history, I do think that you have allergic conjunctivitis and rhinitis.  I prescribed Patanol eyedrops and Xyzal antihistamine.  I think you  Will have the progressive improvement but would like to update on Monday. Please my chart me on monday  If over the weekend you have any creamy yellow type discharge indicating bacterial conjunctivitis then you could my chart me earlier and I would send in antibiotic eyedrop.  Follow-up in 7 days or as needed.

## 2018-03-07 NOTE — Progress Notes (Signed)
Subjective:    Patient ID: Bobby [name], male    DOB: [birth year], 67 y.o.   MRN: [id number]  HPI  Pt in with some redness to eyes for about one week. Mild stinging to his eyes.   Pt thought exposure to pollen.  He has history of allergies in past. Pt used to have no allergies when younger but over past 5 years or so seems to be little worse.   But in past allergies only last 3-4 days and then resolves. This time symptoms seem longer per pt.  Pt wife thinks maybe slight crustiness to eye.  Mild runny nose and some pnd. On sneezing.  No repetitive yard work recently but he did Haematologist.   Review of Systems  Constitutional: Negative for chills, fatigue and fever.  HENT: Positive for congestion and postnasal drip. Negative for facial swelling, sneezing and sore throat.   Eyes: Positive for redness. Negative for photophobia, itching and visual disturbance.       Slight burn to his eyes.  Respiratory: Negative for cough, chest tightness and wheezing.   Cardiovascular: Negative for chest pain and palpitations.  Gastrointestinal: Negative for abdominal pain.  Musculoskeletal: Negative for back pain.  Hematological: Negative for adenopathy. Does not bruise/bleed easily.    Past Medical History:  Diagnosis Date  . Anxiety and depression   . Hypertension   . Hyponatremia 03-2013   ongoing, seeing Dr. Posey Pronto  . Insomnia   . SIADH (syndrome of inappropriate ADH production) ([place]) 2017   Dr. Posey Pronto     Social History   Socioeconomic History  . Marital status: Married    Spouse name: Not on file  . Number of children: 2  . Years of education: 16  . Highest education level: Bachelor's degree (e.g., BA, AB, BS)  Occupational History  . Occupation: Armed forces operational officer   Social Needs  . Financial resource strain: Not on file  . Food insecurity:    Worry: Not on file    Inability: Not on file  . Transportation needs:    Medical: Not on file    Non-medical: Not on  file  Tobacco Use  . Smoking status: Never Smoker  . Smokeless tobacco: Never Used  Substance and Sexual Activity  . Alcohol use: Yes    Alcohol/week: 0.0 oz    Comment: socially , 2 to 3 drinks  a day  . Drug use: No  . Sexual activity: Not on file  Lifestyle  . Physical activity:    Days per week: Not on file    Minutes per session: Not on file  . Stress: Not on file  Relationships  . Social connections:    Talks on phone: Not on file    Gets together: Not on file    Attends religious service: Not on file    Active member of club or organization: Not on file    Attends meetings of clubs or organizations: Not on file    Relationship status: Not on file  . Intimate partner violence:    Fear of current or ex partner: Not on file    Emotionally abused: Not on file    Physically abused: Not on file    Forced sexual activity: Not on file  Other Topics Concern  . Not on file  Social History Narrative   [name], Married,    2 children   mom passed away 2008/06/03   Right-handed.   2-3 cups coffee per day.  Lives at home with wife.             Past Surgical History:  Procedure Laterality Date  . SHOULDER SURGERY  1980s   right shoulder    Family History  Problem Relation Age of Onset  . Hypertension Mother   . Stroke Mother   . Diabetes Mother   . Emphysema Mother        smoked  . Thyroid disease Mother   . Alcoholism Father   . Colon cancer Neg Hx   . Prostate cancer Neg Hx   . CAD Neg Hx     Allergies  Allergen Reactions  . Tramadol     REACTION: hives    Current Outpatient Medications on File Prior to Visit  Medication Sig Dispense Refill  . carvedilol (COREG) 6.25 MG tablet Take 1 tablet (6.25 mg total) by mouth 2 (two) times daily with a meal. 180 tablet 1  . clonazePAM (KLONOPIN) 0.5 MG tablet Take 1 tablet (0.5 mg total) 2 (two) times daily as needed by mouth for anxiety. 60 tablet 3  . folic acid (FOLVITE) 1 MG tablet Take 1 tablet (1 mg total)  by mouth daily. 90 tablet 2  . Multiple Vitamin (MULTIVITAMIN WITH MINERALS) TABS Take 1 tablet by mouth daily.    . NONFORMULARY OR COMPOUNDED ITEM Testosterone  5% gel apply. Apply 3 clicks on each arm every morning. 45 each 1  . thiamine 100 MG tablet Take 1 tablet (100 mg total) by mouth daily. 90 tablet 2  . zolpidem (AMBIEN) 10 MG tablet Take 1 tablet (10 mg total) by mouth at bedtime as needed for sleep. 90 tablet 1   No current facility-administered medications on file prior to visit.     BP 135/90   Pulse 66   Temp 98.2 F (36.8 C) (Oral)   Resp 16   Ht 6' (1.829 m)   Wt 192 lb 12.8 oz (87.5 kg)   SpO2 99%   BMI 26.15 kg/m       Objective:   Physical Exam  General  Mental Status - Alert. General Appearance - Well groomed. Not in acute distress.  Skin Rashes- No Rashes.(upon inspection of skin on face no vesicle seen)  HEENT Head- Normal. Ear Auditory Canal - Left- Normal. Right - Normal.Tympanic Membrane- Left- Normal. Right- Normal. Eye Sclera/Conjunctiva- Left-mild red injected conjunctiva right-mild to moderate injection conjunctiva(a little bit worse than left side) neither side has any yellow discharge/matting.Marland Kitchen Nose & Sinuses Nasal Mucosa- Left-  Boggy and Congested. Right-  Boggy and  Congested.Bilateral no  maxillary and  No frontal sinus pressure. Mouth & Throat Lips: Upper Lip- Normal: no dryness, cracking, pallor, cyanosis, or vesicular eruption. Lower Lip-Normal: no dryness, cracking, pallor, cyanosis or vesicular eruption. Buccal Mucosa- Bilateral- No Aphthous ulcers. Oropharynx- No Discharge or Erythema. +pnd Tonsils: Characteristics- Bilateral- No Erythema or Congestion. Size/Enlargement- Bilateral- No enlargement. Discharge- bilateral-None.  Neck Neck- Supple. No Masses.   Chest and Lung Exam Auscultation: Breath Sounds:-Clear even and unlabored.  Cardiovascular Auscultation:Rythm- Regular, rate and rhythm. Murmurs & Other Heart  Sounds:Ausculatation of the heart reveal- No Murmurs.  Lymphatic Head & Neck General Head & Neck Lymphatics: Bilateral: Description- No Localized lymphadenopathy.      Assessment & Plan:  By exam today and recent history, I do think that you have allergic conjunctivitis and rhinitis.  I prescribed Patanol eyedrops and Xyzal antihistamine.  I think you  Will have the progressive improvement but would like to update  on Monday. Please my chart me on monday  If over the weekend you have any creamy yellow type discharge indicating bacterial conjunctivitis then you could my chart me earlier and I would send in antibiotic eyedrop.  Follow-up in 7 days or as needed.  Mackie Pai, PA-C

## 2018-03-09 ENCOUNTER — Other Ambulatory Visit: Payer: Self-pay | Admitting: Internal Medicine

## 2018-03-11 DIAGNOSIS — M25551 Pain in right hip: Secondary | ICD-10-CM | POA: Diagnosis not present

## 2018-03-11 DIAGNOSIS — M9905 Segmental and somatic dysfunction of pelvic region: Secondary | ICD-10-CM | POA: Diagnosis not present

## 2018-03-11 DIAGNOSIS — M9903 Segmental and somatic dysfunction of lumbar region: Secondary | ICD-10-CM | POA: Diagnosis not present

## 2018-03-11 DIAGNOSIS — M5137 Other intervertebral disc degeneration, lumbosacral region: Secondary | ICD-10-CM | POA: Diagnosis not present

## 2018-03-11 NOTE — Telephone Encounter (Signed)
Spoke with the patient,  refill sent. Also, was referred to nephrology due to hyponatremia, has not gotten a call yet.  He feels well. Gwendolyn: please  follow-up on the nephrology  referral.

## 2018-03-11 NOTE — Telephone Encounter (Signed)
Pt is requesting refill on clonazepam.   Last OV: 02/18/2018 Last Fill: 09/28/2017 #60 and 3RF UDS: 08/21/2017 Low risk  NCCR printed- no discrepancies notes- sent for scanning  Please advise.

## 2018-03-12 DIAGNOSIS — M25551 Pain in right hip: Secondary | ICD-10-CM | POA: Diagnosis not present

## 2018-03-12 DIAGNOSIS — M5137 Other intervertebral disc degeneration, lumbosacral region: Secondary | ICD-10-CM | POA: Diagnosis not present

## 2018-03-12 DIAGNOSIS — M9903 Segmental and somatic dysfunction of lumbar region: Secondary | ICD-10-CM | POA: Diagnosis not present

## 2018-03-12 DIAGNOSIS — M9905 Segmental and somatic dysfunction of pelvic region: Secondary | ICD-10-CM | POA: Diagnosis not present

## 2018-03-14 DIAGNOSIS — M25551 Pain in right hip: Secondary | ICD-10-CM | POA: Diagnosis not present

## 2018-03-14 DIAGNOSIS — M5137 Other intervertebral disc degeneration, lumbosacral region: Secondary | ICD-10-CM | POA: Diagnosis not present

## 2018-03-14 DIAGNOSIS — M9905 Segmental and somatic dysfunction of pelvic region: Secondary | ICD-10-CM | POA: Diagnosis not present

## 2018-03-14 DIAGNOSIS — M9903 Segmental and somatic dysfunction of lumbar region: Secondary | ICD-10-CM | POA: Diagnosis not present

## 2018-03-19 DIAGNOSIS — M5137 Other intervertebral disc degeneration, lumbosacral region: Secondary | ICD-10-CM | POA: Diagnosis not present

## 2018-03-19 DIAGNOSIS — M9905 Segmental and somatic dysfunction of pelvic region: Secondary | ICD-10-CM | POA: Diagnosis not present

## 2018-03-19 DIAGNOSIS — M9903 Segmental and somatic dysfunction of lumbar region: Secondary | ICD-10-CM | POA: Diagnosis not present

## 2018-03-19 DIAGNOSIS — M25551 Pain in right hip: Secondary | ICD-10-CM | POA: Diagnosis not present

## 2018-03-21 DIAGNOSIS — M5137 Other intervertebral disc degeneration, lumbosacral region: Secondary | ICD-10-CM | POA: Diagnosis not present

## 2018-03-21 DIAGNOSIS — M25551 Pain in right hip: Secondary | ICD-10-CM | POA: Diagnosis not present

## 2018-03-21 DIAGNOSIS — M9903 Segmental and somatic dysfunction of lumbar region: Secondary | ICD-10-CM | POA: Diagnosis not present

## 2018-03-21 DIAGNOSIS — M9905 Segmental and somatic dysfunction of pelvic region: Secondary | ICD-10-CM | POA: Diagnosis not present

## 2018-03-26 DIAGNOSIS — M25551 Pain in right hip: Secondary | ICD-10-CM | POA: Diagnosis not present

## 2018-03-26 DIAGNOSIS — M9903 Segmental and somatic dysfunction of lumbar region: Secondary | ICD-10-CM | POA: Diagnosis not present

## 2018-03-26 DIAGNOSIS — M9905 Segmental and somatic dysfunction of pelvic region: Secondary | ICD-10-CM | POA: Diagnosis not present

## 2018-03-26 DIAGNOSIS — M5137 Other intervertebral disc degeneration, lumbosacral region: Secondary | ICD-10-CM | POA: Diagnosis not present

## 2018-03-28 DIAGNOSIS — M25551 Pain in right hip: Secondary | ICD-10-CM | POA: Diagnosis not present

## 2018-03-28 DIAGNOSIS — M5137 Other intervertebral disc degeneration, lumbosacral region: Secondary | ICD-10-CM | POA: Diagnosis not present

## 2018-03-28 DIAGNOSIS — M9903 Segmental and somatic dysfunction of lumbar region: Secondary | ICD-10-CM | POA: Diagnosis not present

## 2018-03-28 DIAGNOSIS — M9905 Segmental and somatic dysfunction of pelvic region: Secondary | ICD-10-CM | POA: Diagnosis not present

## 2018-03-29 ENCOUNTER — Other Ambulatory Visit: Payer: Self-pay | Admitting: Medical

## 2018-04-02 DIAGNOSIS — M9905 Segmental and somatic dysfunction of pelvic region: Secondary | ICD-10-CM | POA: Diagnosis not present

## 2018-04-02 DIAGNOSIS — M25551 Pain in right hip: Secondary | ICD-10-CM | POA: Diagnosis not present

## 2018-04-02 DIAGNOSIS — M9903 Segmental and somatic dysfunction of lumbar region: Secondary | ICD-10-CM | POA: Diagnosis not present

## 2018-04-02 DIAGNOSIS — M5137 Other intervertebral disc degeneration, lumbosacral region: Secondary | ICD-10-CM | POA: Diagnosis not present

## 2018-04-04 DIAGNOSIS — M25551 Pain in right hip: Secondary | ICD-10-CM | POA: Diagnosis not present

## 2018-04-04 DIAGNOSIS — M5137 Other intervertebral disc degeneration, lumbosacral region: Secondary | ICD-10-CM | POA: Diagnosis not present

## 2018-04-04 DIAGNOSIS — M9905 Segmental and somatic dysfunction of pelvic region: Secondary | ICD-10-CM | POA: Diagnosis not present

## 2018-04-04 DIAGNOSIS — I10 Essential (primary) hypertension: Secondary | ICD-10-CM | POA: Diagnosis not present

## 2018-04-04 DIAGNOSIS — E871 Hypo-osmolality and hyponatremia: Secondary | ICD-10-CM | POA: Diagnosis not present

## 2018-04-04 DIAGNOSIS — M9903 Segmental and somatic dysfunction of lumbar region: Secondary | ICD-10-CM | POA: Diagnosis not present

## 2018-04-04 LAB — BASIC METABOLIC PANEL
BUN: 20 (ref 4–21)
CREATININE: 1.3 (ref 0.6–1.3)
Glucose: 89
Potassium: 4.3 (ref 3.4–5.3)
Sodium: 133 — AB (ref 137–147)

## 2018-04-04 LAB — HEPATIC FUNCTION PANEL
ALT: 26 (ref 10–40)
AST: 28 (ref 14–40)
Alkaline Phosphatase: 64 (ref 25–125)
Bilirubin, Total: 0.6

## 2018-04-09 DIAGNOSIS — M9903 Segmental and somatic dysfunction of lumbar region: Secondary | ICD-10-CM | POA: Diagnosis not present

## 2018-04-09 DIAGNOSIS — M9905 Segmental and somatic dysfunction of pelvic region: Secondary | ICD-10-CM | POA: Diagnosis not present

## 2018-04-09 DIAGNOSIS — M25551 Pain in right hip: Secondary | ICD-10-CM | POA: Diagnosis not present

## 2018-04-09 DIAGNOSIS — M5137 Other intervertebral disc degeneration, lumbosacral region: Secondary | ICD-10-CM | POA: Diagnosis not present

## 2018-04-11 DIAGNOSIS — M9905 Segmental and somatic dysfunction of pelvic region: Secondary | ICD-10-CM | POA: Diagnosis not present

## 2018-04-11 DIAGNOSIS — M25551 Pain in right hip: Secondary | ICD-10-CM | POA: Diagnosis not present

## 2018-04-11 DIAGNOSIS — M5137 Other intervertebral disc degeneration, lumbosacral region: Secondary | ICD-10-CM | POA: Diagnosis not present

## 2018-04-11 DIAGNOSIS — M9903 Segmental and somatic dysfunction of lumbar region: Secondary | ICD-10-CM | POA: Diagnosis not present

## 2018-04-16 DIAGNOSIS — M5137 Other intervertebral disc degeneration, lumbosacral region: Secondary | ICD-10-CM | POA: Diagnosis not present

## 2018-04-16 DIAGNOSIS — M9903 Segmental and somatic dysfunction of lumbar region: Secondary | ICD-10-CM | POA: Diagnosis not present

## 2018-04-16 DIAGNOSIS — M25551 Pain in right hip: Secondary | ICD-10-CM | POA: Diagnosis not present

## 2018-04-16 DIAGNOSIS — M9905 Segmental and somatic dysfunction of pelvic region: Secondary | ICD-10-CM | POA: Diagnosis not present

## 2018-04-18 DIAGNOSIS — M9903 Segmental and somatic dysfunction of lumbar region: Secondary | ICD-10-CM | POA: Diagnosis not present

## 2018-04-18 DIAGNOSIS — M5137 Other intervertebral disc degeneration, lumbosacral region: Secondary | ICD-10-CM | POA: Diagnosis not present

## 2018-04-18 DIAGNOSIS — M25551 Pain in right hip: Secondary | ICD-10-CM | POA: Diagnosis not present

## 2018-04-18 DIAGNOSIS — M9905 Segmental and somatic dysfunction of pelvic region: Secondary | ICD-10-CM | POA: Diagnosis not present

## 2018-04-22 ENCOUNTER — Encounter: Payer: Self-pay | Admitting: Internal Medicine

## 2018-04-23 DIAGNOSIS — M9903 Segmental and somatic dysfunction of lumbar region: Secondary | ICD-10-CM | POA: Diagnosis not present

## 2018-04-23 DIAGNOSIS — M5137 Other intervertebral disc degeneration, lumbosacral region: Secondary | ICD-10-CM | POA: Diagnosis not present

## 2018-04-23 DIAGNOSIS — M9905 Segmental and somatic dysfunction of pelvic region: Secondary | ICD-10-CM | POA: Diagnosis not present

## 2018-04-23 DIAGNOSIS — M25551 Pain in right hip: Secondary | ICD-10-CM | POA: Diagnosis not present

## 2018-04-25 ENCOUNTER — Ambulatory Visit (INDEPENDENT_AMBULATORY_CARE_PROVIDER_SITE_OTHER): Payer: Medicare Other | Admitting: Internal Medicine

## 2018-04-25 ENCOUNTER — Encounter: Payer: Self-pay | Admitting: Internal Medicine

## 2018-04-25 VITALS — BP 140/74 | HR 62 | Temp 98.0°F | Resp 16 | Ht 72.0 in | Wt 191.0 lb

## 2018-04-25 DIAGNOSIS — F411 Generalized anxiety disorder: Secondary | ICD-10-CM | POA: Diagnosis not present

## 2018-04-25 DIAGNOSIS — I10 Essential (primary) hypertension: Secondary | ICD-10-CM

## 2018-04-25 DIAGNOSIS — E222 Syndrome of inappropriate secretion of antidiuretic hormone: Secondary | ICD-10-CM

## 2018-04-25 DIAGNOSIS — G4700 Insomnia, unspecified: Secondary | ICD-10-CM

## 2018-04-25 MED ORDER — CLONAZEPAM 0.5 MG PO TABS: 0.5000 mg | ORAL_TABLET | Freq: Two times a day (BID) | ORAL | 2 refills | Status: DC | PRN

## 2018-04-25 MED ORDER — ZOLPIDEM TARTRATE 10 MG PO TABS: 10.0000 mg | ORAL_TABLET | Freq: Every evening | ORAL | 1 refills | Status: DC | PRN

## 2018-04-25 NOTE — Assessment & Plan Note (Signed)
HTN: Continue with carvedilol Anxiety, depression, insomnia: Refill clonazepam and Ambien.  Feels well.  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 RTC 6 months, CPX

## 2018-04-25 NOTE — Patient Instructions (Addendum)
  GO TO THE FRONT DESK Schedule your next appointment for a  Physical exam in 6 months, fasting     

## 2018-04-25 NOTE — Progress Notes (Signed)
Subjective:    Patient ID: Bobby [name], male    DOB: 1951-03-02, 67 y.o.   MRN: [id number]  DOS:  04/25/2018 Type of visit - description : f/u Interval history: Since the last office visit he is doing well. He saw nephrology and got good reports HTN: Well-controlled, ambulatory BPs well controlled, systolic BP never more than 130.  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 Denies chest pain, difficulty breathing, no nausea, vomiting, diarrhea. Able to sleep well with medication.  Past Medical History:  Diagnosis Date  . Anxiety and depression   . Hypertension   . Hyponatremia 03-2013   ongoing, seeing Dr. Posey Pronto  . Insomnia   . SIADH (syndrome of inappropriate ADH production) ([place]) 2017   Dr. Posey Pronto    Past Surgical History:  Procedure Laterality Date  . SHOULDER SURGERY  1980s   right shoulder    Social History   Socioeconomic History  . Marital status: Married    Spouse name: Not on file  . Number of children: 2  . Years of education: 16  . Highest education level: Bachelor's degree (e.g., BA, AB, BS)  Occupational History  . Occupation: Armed forces operational officer   Social Needs  . Financial resource strain: Not on file  . Food insecurity:    Worry: Not on file    Inability: Not on file  . Transportation needs:    Medical: Not on file    Non-medical: Not on file  Tobacco Use  . Smoking status: Never Smoker  . Smokeless tobacco: Never Used  Substance and Sexual Activity  . Alcohol use: Yes    Alcohol/week: 0.0 oz    Comment: socially , 2 to 3 drinks  a day  . Drug use: No  . Sexual activity: Not on file  Lifestyle  . Physical activity:    Days per week: Not on file    Minutes per session: Not on file  . Stress: Not on file  Relationships  . Social connections:    Talks on phone: Not on file    Gets together: Not on file    Attends religious service: Not on file    Active member of club or organization: Not on file    Attends meetings of clubs or organizations: Not on file    Relationship status: Not on file  . Intimate partner violence:    Fear of current or ex partner: Not on file    Emotionally abused: Not on file    Physically abused: Not on file    Forced sexual activity: Not on file  Other Topics Concern  . Not on file  Social History Narrative   "Bobby [name]", Married,    2 children   mom passed away May 31, 2008   Right-handed.   2-3 cups coffee per day.   Lives at home with wife.               Allergies as of 04/25/2018      Reactions   Tramadol    REACTION: hives      Medication List        Accurate as of 04/25/18  8:26 AM. Always use your most recent med list.          carvedilol 6.25 MG tablet Commonly known as:  COREG Take 1 tablet (6.25 mg total) by mouth 2 (two) times daily with a meal.  clonazePAM 0.5 MG tablet Commonly known as:  KLONOPIN Take 1 tablet (0.5 mg total) by mouth 2 (two) times daily as needed for anxiety.   folic acid 1 MG tablet Commonly known as:  FOLVITE Take 1 tablet (1 mg total) by mouth daily.   levocetirizine 5 MG tablet Commonly known as:  XYZAL Take 1 tablet (5 mg total) by mouth daily as needed for allergies.   multivitamin with minerals Tabs tablet Take 1 tablet by mouth daily.   NONFORMULARY OR COMPOUNDED ITEM Testosterone  5% gel apply. Apply 3 clicks on each arm every morning.   olopatadine 0.1 % ophthalmic solution Commonly known as:  PATANOL Place 1 drop into both eyes 2 (two) times daily.   thiamine 100 MG tablet Take 1 tablet (100 mg total) by mouth daily.   zolpidem 10 MG tablet Commonly known as:  AMBIEN Take 1 tablet (10 mg total) by mouth at bedtime as needed for sleep.          Objective:   Physical Exam BP 140/74 (BP Location: Right Arm, Patient Position: Sitting, Cuff Size: Small)   Pulse 62   Temp 98 F (36.7 C) (Oral)   Resp 16   Ht 6' (1.829 m)   Wt  191 lb (86.6 kg)   SpO2 99%   BMI 25.90 kg/m  General:   Well developed, well nourished . NAD.  HEENT:  Normocephalic . Face symmetric, atraumatic Lungs:  CTA B Normal respiratory effort, no intercostal retractions, no accessory muscle use. Heart: RRR,  no murmur.  No pretibial edema bilaterally  Skin: Not pale. Not jaundice Neurologic:  alert & oriented X3.  Speech normal, gait appropriate for age and unassisted Psych--  Cognition and judgment appear intact.  Cooperative with normal attention span and concentration.  Behavior appropriate. No anxious or depressed appearing.      Assessment & Plan:    Assessment  HTN Anxiety, depression, insomnia : used lexapro, d/c (decreased libido) Daily etoh use  Hypogonadotrophic Hypogonadism: per  Dr Dwyane Dee , on HRT Memory problems? [facility] per neuro visit 12/2017 Hyponatremia onset 2014, admitted. 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  Feels well.  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 RTC 6 months, CPX

## 2018-04-29 DIAGNOSIS — M25551 Pain in right hip: Secondary | ICD-10-CM | POA: Diagnosis not present

## 2018-04-29 DIAGNOSIS — M9903 Segmental and somatic dysfunction of lumbar region: Secondary | ICD-10-CM | POA: Diagnosis not present

## 2018-04-29 DIAGNOSIS — M9905 Segmental and somatic dysfunction of pelvic region: Secondary | ICD-10-CM | POA: Diagnosis not present

## 2018-04-29 DIAGNOSIS — M5137 Other intervertebral disc degeneration, lumbosacral region: Secondary | ICD-10-CM | POA: Diagnosis not present

## 2018-05-02 DIAGNOSIS — M9905 Segmental and somatic dysfunction of pelvic region: Secondary | ICD-10-CM | POA: Diagnosis not present

## 2018-05-02 DIAGNOSIS — M9903 Segmental and somatic dysfunction of lumbar region: Secondary | ICD-10-CM | POA: Diagnosis not present

## 2018-05-02 DIAGNOSIS — M25551 Pain in right hip: Secondary | ICD-10-CM | POA: Diagnosis not present

## 2018-05-02 DIAGNOSIS — M5137 Other intervertebral disc degeneration, lumbosacral region: Secondary | ICD-10-CM | POA: Diagnosis not present

## 2018-05-03 ENCOUNTER — Other Ambulatory Visit: Payer: Self-pay | Admitting: Internal Medicine

## 2018-05-06 ENCOUNTER — Encounter: Payer: Self-pay | Admitting: Endocrinology

## 2018-05-06 ENCOUNTER — Other Ambulatory Visit: Payer: Self-pay

## 2018-05-06 ENCOUNTER — Telehealth: Payer: Self-pay | Admitting: Endocrinology

## 2018-05-06 MED ORDER — NONFORMULARY OR COMPOUNDED ITEM: 1 refills | Status: DC

## 2018-05-06 NOTE — Telephone Encounter (Signed)
Patient stated he is needing the testosterone gel sent into the pharmacy  He is out of refills.   NONFORMULARY OR COMPOUNDED [place], [place]

## 2018-05-06 NOTE — Telephone Encounter (Signed)
Medication called in to pharmacy.

## 2018-05-06 NOTE — Telephone Encounter (Signed)
He can have a 30-day supply with only 1 refill, supposed to be doing 2 clicks on each arm confirm that he is doing this daily.  He is supposed to be seen in follow-up and needs to make an appointment with labs

## 2018-05-06 NOTE — Telephone Encounter (Signed)
Ok to refill? Will have to be called in.

## 2018-05-07 DIAGNOSIS — M25551 Pain in right hip: Secondary | ICD-10-CM | POA: Diagnosis not present

## 2018-05-07 DIAGNOSIS — M9903 Segmental and somatic dysfunction of lumbar region: Secondary | ICD-10-CM | POA: Diagnosis not present

## 2018-05-07 DIAGNOSIS — M9905 Segmental and somatic dysfunction of pelvic region: Secondary | ICD-10-CM | POA: Diagnosis not present

## 2018-05-07 DIAGNOSIS — M5137 Other intervertebral disc degeneration, lumbosacral region: Secondary | ICD-10-CM | POA: Diagnosis not present

## 2018-05-14 DIAGNOSIS — M5137 Other intervertebral disc degeneration, lumbosacral region: Secondary | ICD-10-CM | POA: Diagnosis not present

## 2018-05-14 DIAGNOSIS — M9903 Segmental and somatic dysfunction of lumbar region: Secondary | ICD-10-CM | POA: Diagnosis not present

## 2018-05-14 DIAGNOSIS — M25551 Pain in right hip: Secondary | ICD-10-CM | POA: Diagnosis not present

## 2018-05-14 DIAGNOSIS — M9905 Segmental and somatic dysfunction of pelvic region: Secondary | ICD-10-CM | POA: Diagnosis not present

## 2018-05-16 DIAGNOSIS — M5137 Other intervertebral disc degeneration, lumbosacral region: Secondary | ICD-10-CM | POA: Diagnosis not present

## 2018-05-16 DIAGNOSIS — M9903 Segmental and somatic dysfunction of lumbar region: Secondary | ICD-10-CM | POA: Diagnosis not present

## 2018-05-16 DIAGNOSIS — M9905 Segmental and somatic dysfunction of pelvic region: Secondary | ICD-10-CM | POA: Diagnosis not present

## 2018-05-16 DIAGNOSIS — M25551 Pain in right hip: Secondary | ICD-10-CM | POA: Diagnosis not present

## 2018-05-17 DIAGNOSIS — H52223 Regular astigmatism, bilateral: Secondary | ICD-10-CM | POA: Diagnosis not present

## 2018-05-17 DIAGNOSIS — H5203 Hypermetropia, bilateral: Secondary | ICD-10-CM | POA: Diagnosis not present

## 2018-05-17 DIAGNOSIS — H04203 Unspecified epiphora, bilateral lacrimal glands: Secondary | ICD-10-CM | POA: Diagnosis not present

## 2018-05-17 DIAGNOSIS — H04123 Dry eye syndrome of bilateral lacrimal glands: Secondary | ICD-10-CM | POA: Diagnosis not present

## 2018-05-17 DIAGNOSIS — H524 Presbyopia: Secondary | ICD-10-CM | POA: Diagnosis not present

## 2018-05-17 DIAGNOSIS — H25813 Combined forms of age-related cataract, bilateral: Secondary | ICD-10-CM | POA: Diagnosis not present

## 2018-05-28 DIAGNOSIS — M5137 Other intervertebral disc degeneration, lumbosacral region: Secondary | ICD-10-CM | POA: Diagnosis not present

## 2018-05-28 DIAGNOSIS — M9903 Segmental and somatic dysfunction of lumbar region: Secondary | ICD-10-CM | POA: Diagnosis not present

## 2018-05-28 DIAGNOSIS — M25551 Pain in right hip: Secondary | ICD-10-CM | POA: Diagnosis not present

## 2018-05-28 DIAGNOSIS — M9905 Segmental and somatic dysfunction of pelvic region: Secondary | ICD-10-CM | POA: Diagnosis not present

## 2018-06-04 DIAGNOSIS — M9905 Segmental and somatic dysfunction of pelvic region: Secondary | ICD-10-CM | POA: Diagnosis not present

## 2018-06-04 DIAGNOSIS — M25551 Pain in right hip: Secondary | ICD-10-CM | POA: Diagnosis not present

## 2018-06-04 DIAGNOSIS — M9903 Segmental and somatic dysfunction of lumbar region: Secondary | ICD-10-CM | POA: Diagnosis not present

## 2018-06-04 DIAGNOSIS — M5137 Other intervertebral disc degeneration, lumbosacral region: Secondary | ICD-10-CM | POA: Diagnosis not present

## 2018-06-07 ENCOUNTER — Encounter: Payer: Self-pay | Admitting: Family Medicine

## 2018-06-07 ENCOUNTER — Ambulatory Visit (INDEPENDENT_AMBULATORY_CARE_PROVIDER_SITE_OTHER): Payer: Medicare Other | Admitting: Family Medicine

## 2018-06-07 VITALS — BP 122/80 | HR 75 | Temp 98.2°F | Ht 72.0 in | Wt 193.1 lb

## 2018-06-07 DIAGNOSIS — R5383 Other fatigue: Secondary | ICD-10-CM | POA: Diagnosis not present

## 2018-06-07 LAB — CBC
HCT: 36.6 % — ABNORMAL LOW (ref 39.0–52.0)
HEMOGLOBIN: 13 g/dL (ref 13.0–17.0)
MCHC: 35.4 g/dL (ref 30.0–36.0)
MCV: 94 fl (ref 78.0–100.0)
PLATELETS: 214 10*3/uL (ref 150.0–400.0)
RBC: 3.9 Mil/uL — AB (ref 4.22–5.81)
RDW: 12.9 % (ref 11.5–15.5)
WBC: 11 10*3/uL — AB (ref 4.0–10.5)

## 2018-06-07 LAB — COMPREHENSIVE METABOLIC PANEL
ALK PHOS: 137 U/L — AB (ref 39–117)
ALT: 150 U/L — ABNORMAL HIGH (ref 0–53)
AST: 158 U/L — ABNORMAL HIGH (ref 0–37)
Albumin: 3.8 g/dL (ref 3.5–5.2)
BILIRUBIN TOTAL: 1.2 mg/dL (ref 0.2–1.2)
BUN: 12 mg/dL (ref 6–23)
CALCIUM: 9.2 mg/dL (ref 8.4–10.5)
CO2: 32 mEq/L (ref 19–32)
CREATININE: 0.91 mg/dL (ref 0.40–1.50)
Chloride: 92 mEq/L — ABNORMAL LOW (ref 96–112)
GFR: 87.76 mL/min (ref >60.00)
Glucose, Bld: 102 mg/dL — ABNORMAL HIGH (ref 70–99)
Potassium: 4.2 mEq/L (ref 3.5–5.1)
Sodium: 130 mEq/L — ABNORMAL LOW (ref 135–145)
TOTAL PROTEIN: 6.6 g/dL (ref 6.0–8.3)

## 2018-06-07 LAB — TSH: TSH: 2.15 u[IU]/mL (ref 0.35–4.50)

## 2018-06-07 NOTE — Progress Notes (Signed)
Chief Complaint  Patient presents with  . Fatigue    Subjective: Patient is a 67 y.o. male here for fatigue.  This has been going on for 2 days. Feels low on energy, not sleepy. He will intermittently snore, no apneic episodes. He does have a hx of anxiety and depression, this is well controlled at this time. Eating and drinking normally. Granddaughter had HFM disease. No other known sick contacts. No recent fevers, illness, medication change, diarrhea, vomiting, bleeding, cough, shortness of breath, unexplained weight changes.  ROS: Endo: +fatigue  Const: no fevers  Past Medical History:  Diagnosis Date  . Anxiety and depression   . Hypertension   . Hyponatremia 03-2013   ongoing, seeing Dr. Posey Pronto  . Insomnia   . SIADH (syndrome of inappropriate ADH production) ([place]) 2017   Dr. Posey Pronto    Objective: BP 122/80 (BP Location: Left Arm, Patient Position: Sitting, Cuff Size: Normal)   Pulse 75   Temp 98.2 F (36.8 C) (Oral)   Ht 6' (1.829 m)   Wt 193 lb 2 oz (87.6 kg)   SpO2 97%   BMI 26.19 kg/m  General: Awake, appears stated age HEENT: MMM, EOMi, ears neg b/l, nares patent, no d/c Heart: RRR, no LE edema Lungs: CTAB, no rales, wheezes or rhonchi. No accessory muscle use Abd: BS+, soft, mild distension, no ttp, no masses or organomegaly Psych: Age appropriate judgment and insight, normal affect and mood  Assessment and Plan: Fatigue, unspecified type - Plan: CBC, Comprehensive metabolic panel, TSH  Orders as above. Ck above. Does not sound like OSA or mood issue. Could be acute viral illness. Will check some labs. Stay hydrated, eat regularly, try to get >7 hrs of sleep nightly. If this persists, return to clinic. The patient voiced understanding and agreement to the plan.  [place], DO 06/07/18  2:00 PM

## 2018-06-07 NOTE — Patient Instructions (Addendum)
Keep the diet clean, stay hydrated and keep active.  1-2 business days to get the results of your labs back.   This could be a viral illness.   Let us know if you need anything.

## 2018-06-07 NOTE — Progress Notes (Signed)
Pre visit review using our clinic review tool, if applicable. No additional management support is needed unless otherwise documented below in the visit note. 

## 2018-06-08 ENCOUNTER — Other Ambulatory Visit: Payer: Self-pay | Admitting: Family Medicine

## 2018-06-08 DIAGNOSIS — R7401 Elevation of levels of liver transaminase levels: Secondary | ICD-10-CM

## 2018-06-08 DIAGNOSIS — R740 Nonspecific elevation of levels of transaminase and lactic acid dehydrogenase [LDH]: Principal | ICD-10-CM

## 2018-06-11 DIAGNOSIS — M9905 Segmental and somatic dysfunction of pelvic region: Secondary | ICD-10-CM | POA: Diagnosis not present

## 2018-06-11 DIAGNOSIS — M25551 Pain in right hip: Secondary | ICD-10-CM | POA: Diagnosis not present

## 2018-06-11 DIAGNOSIS — M5137 Other intervertebral disc degeneration, lumbosacral region: Secondary | ICD-10-CM | POA: Diagnosis not present

## 2018-06-11 DIAGNOSIS — M9903 Segmental and somatic dysfunction of lumbar region: Secondary | ICD-10-CM | POA: Diagnosis not present

## 2018-06-13 ENCOUNTER — Other Ambulatory Visit (INDEPENDENT_AMBULATORY_CARE_PROVIDER_SITE_OTHER): Payer: Medicare Other

## 2018-06-13 DIAGNOSIS — R740 Nonspecific elevation of levels of transaminase and lactic acid dehydrogenase [LDH]: Secondary | ICD-10-CM | POA: Diagnosis not present

## 2018-06-13 DIAGNOSIS — R7401 Elevation of levels of liver transaminase levels: Secondary | ICD-10-CM

## 2018-06-13 LAB — COMPREHENSIVE METABOLIC PANEL
ALT: 77 U/L — ABNORMAL HIGH (ref 0–53)
AST: 42 U/L — ABNORMAL HIGH (ref 0–37)
Albumin: 4 g/dL (ref 3.5–5.2)
Alkaline Phosphatase: 79 U/L (ref 39–117)
BUN: 15 mg/dL (ref 6–23)
CALCIUM: 9 mg/dL (ref 8.4–10.5)
CHLORIDE: 96 meq/L (ref 96–112)
CO2: 28 meq/L (ref 19–32)
Creatinine, Ser: 0.86 mg/dL (ref 0.40–1.50)
GFR: 93.67 mL/min (ref >60.00)
GLUCOSE: 92 mg/dL (ref 70–99)
POTASSIUM: 4.5 meq/L (ref 3.5–5.1)
Sodium: 131 mEq/L — ABNORMAL LOW (ref 135–145)
Total Bilirubin: 0.5 mg/dL (ref 0.2–1.2)
Total Protein: 6.9 g/dL (ref 6.0–8.3)

## 2018-06-18 DIAGNOSIS — M9903 Segmental and somatic dysfunction of lumbar region: Secondary | ICD-10-CM | POA: Diagnosis not present

## 2018-06-18 DIAGNOSIS — M9905 Segmental and somatic dysfunction of pelvic region: Secondary | ICD-10-CM | POA: Diagnosis not present

## 2018-06-18 DIAGNOSIS — M5137 Other intervertebral disc degeneration, lumbosacral region: Secondary | ICD-10-CM | POA: Diagnosis not present

## 2018-06-18 DIAGNOSIS — M25551 Pain in right hip: Secondary | ICD-10-CM | POA: Diagnosis not present

## 2018-06-20 ENCOUNTER — Other Ambulatory Visit: Payer: Medicare Other

## 2018-06-25 DIAGNOSIS — M25551 Pain in right hip: Secondary | ICD-10-CM | POA: Diagnosis not present

## 2018-06-25 DIAGNOSIS — M9903 Segmental and somatic dysfunction of lumbar region: Secondary | ICD-10-CM | POA: Diagnosis not present

## 2018-06-25 DIAGNOSIS — M9905 Segmental and somatic dysfunction of pelvic region: Secondary | ICD-10-CM | POA: Diagnosis not present

## 2018-06-25 DIAGNOSIS — M5137 Other intervertebral disc degeneration, lumbosacral region: Secondary | ICD-10-CM | POA: Diagnosis not present

## 2018-07-02 DIAGNOSIS — M9903 Segmental and somatic dysfunction of lumbar region: Secondary | ICD-10-CM | POA: Diagnosis not present

## 2018-07-02 DIAGNOSIS — M9905 Segmental and somatic dysfunction of pelvic region: Secondary | ICD-10-CM | POA: Diagnosis not present

## 2018-07-02 DIAGNOSIS — M25551 Pain in right hip: Secondary | ICD-10-CM | POA: Diagnosis not present

## 2018-07-02 DIAGNOSIS — M5137 Other intervertebral disc degeneration, lumbosacral region: Secondary | ICD-10-CM | POA: Diagnosis not present

## 2018-07-29 ENCOUNTER — Encounter: Payer: Self-pay | Admitting: Internal Medicine

## 2018-07-30 MED ORDER — NONFORMULARY OR COMPOUNDED ITEM: 1 refills | Status: DC

## 2018-08-04 ENCOUNTER — Encounter: Payer: Self-pay | Admitting: Internal Medicine

## 2018-08-05 ENCOUNTER — Encounter: Payer: Self-pay | Admitting: Internal Medicine

## 2018-08-06 ENCOUNTER — Other Ambulatory Visit: Payer: Self-pay | Admitting: Internal Medicine

## 2018-08-06 MED ORDER — SILDENAFIL CITRATE 20 MG PO TABS: 60.0000 mg | ORAL_TABLET | Freq: Every evening | ORAL | 0 refills | Status: DC | PRN

## 2018-08-06 MED ORDER — SILDENAFIL CITRATE 20 MG PO TABS: 60.0000 mg | ORAL_TABLET | Freq: Every evening | ORAL | 3 refills | Status: DC | PRN

## 2018-09-09 ENCOUNTER — Ambulatory Visit: Payer: Self-pay | Admitting: *Deleted

## 2018-09-09 NOTE — Telephone Encounter (Signed)
  Called in saying he fell on the pavement last Thursday and hit the right side of his forehead.   He has a bruise and a black eye on the right that is turning yellow now.   Minor scrap on skin on scalp per pt.   No LOC.  He is also c/o pain in his right ribs that is bothering him.   He denies pain in his head or eye.  Denies being on blood thinners. His speech is clear and appropriate.   He is alert and oriented.  See triage notes.  I have scheduled him with Dr. Larose Kells for 09/11/18 at 1:40.    Instructed him to go to the ED is s/s develop as listed in the protocol.   He verbalized understanding. Reason for Disposition . [1] Last tetanus shot > 5 years ago AND [2] DIRTY cut or scrape  Answer Assessment - Initial Assessment Questions 1. MECHANISM: "How did the injury happen?" For falls, ask: "What height did you fall from?" and "What surface did you fall against?"      I have a bruise on my head and a black eye.   My ribs are still hurting.    I can see fine.   No headaches. 2. ONSET: "When did the injury happen?" (Minutes or hours ago)      Thursday I fell on pavement.    No blood thinners.   I hit the side of my head on the right side.    My ribs hurting on the right side too. 3. NEUROLOGIC SYMPTOMS: "Was there any loss of consciousness?" "Are there any other neurological symptoms?"      Very appropriate.   Speech clear.   I'm driving and all. 4. MENTAL STATUS: "Does the person know who he is, who you are, and where he is?"      Alert and oriented. 5. LOCATION: "What part of the head was hit?"      See above. 6. SCALP APPEARANCE: "What does the scalp look like? Is it bleeding now?" If so, ask: "Is it difficult to stop?"      Just a scrap and a bruise.    7. SIZE: For cuts, bruises, or swelling, ask: "How large is it?" (e.g., inches or centimeters)      It's gone down now.   Right eye was bruised and is now turning yellow.  Vision is fine. 8. PAIN: "Is there any pain?" If so, ask: "How bad is  it?"  (e.g., Scale 1-10; or mild, moderate, severe)     No pain or headache. 9. TETANUS: For any breaks in the skin, ask: "When was the last tetanus booster?"     No cuts 10. OTHER SYMPTOMS: "Do you have any other symptoms?" (e.g., neck pain, vomiting)       No.  No LOC or passing out. 11. PREGNANCY: "Is there any chance you are pregnant?" "When was your last menstrual period?"       N/A  Protocols used: HEAD INJURY-A-AH

## 2018-09-11 ENCOUNTER — Ambulatory Visit (INDEPENDENT_AMBULATORY_CARE_PROVIDER_SITE_OTHER): Payer: Medicare Other | Admitting: Internal Medicine

## 2018-09-11 ENCOUNTER — Encounter: Payer: Self-pay | Admitting: Internal Medicine

## 2018-09-11 VITALS — BP 116/70 | HR 69 | Temp 98.0°F | Resp 16 | Ht 72.0 in | Wt 184.2 lb

## 2018-09-11 DIAGNOSIS — W19XXXA Unspecified fall, initial encounter: Secondary | ICD-10-CM

## 2018-09-11 DIAGNOSIS — S0990XA Unspecified injury of head, initial encounter: Secondary | ICD-10-CM

## 2018-09-11 DIAGNOSIS — Z79899 Other long term (current) drug therapy: Secondary | ICD-10-CM | POA: Diagnosis not present

## 2018-09-11 DIAGNOSIS — F411 Generalized anxiety disorder: Secondary | ICD-10-CM

## 2018-09-11 NOTE — Patient Instructions (Signed)
GO TO THE LAB :  Provide a urine sample    STOP BY THE FIRST FLOOR:  get the CTs  Tylenol  500 mg OTC 2 tabs a day every 8 hours as needed for pain

## 2018-09-11 NOTE — Progress Notes (Signed)
Pre visit review using our clinic review tool, if applicable. No additional management support is needed unless otherwise documented below in the visit note. 

## 2018-09-11 NOTE — Progress Notes (Signed)
Subjective:    Patient ID: Bobby [name], male    DOB: 1951/08/28, 67 y.o.   MRN: [id number]  DOS:  09/11/2018 Type of visit - description : Acute visit, here with his wife Interval history: 09/05/2018, he was walking out of a restaurant with his hands in his pockets, he tripped and fell, he was able to partially stop the fall with the left hand but landed on the right side of his forehead. No LOC, no syncope.  The fall was witnessed. He get up by himself, had minor pain at the site of impact and went home. The next day, a "bump" was noted  at the forehead, was going from the R temple to midway the other side. Subsequently bruising was noted at the right eyelids and the next day to the left side as well. He also developed right right-sided chest pain with movements, that is overall better.   Review of Systems Denies headache, nausea or vomiting. No diplopia No neck pain   Past Medical History:  Diagnosis Date  . Anxiety and depression   . Hypertension   . Hyponatremia 03-2013   ongoing, seeing Dr. Posey [name]  . Insomnia   . SIADH (syndrome of inappropriate ADH production) ([place]) 2017   Dr. Posey [name]    Past Surgical History:  Procedure Laterality Date  . SHOULDER SURGERY  1980s   right shoulder    Social History   Socioeconomic History  . Marital status: Married    Spouse name: Not on file  . Number of children: 2  . Years of education: 16  . Highest education level: Bachelor's degree (e.g., BA, AB, BS)  Occupational History  . Occupation: Armed forces operational officer   Social Needs  . Financial resource strain: Not on file  . Food insecurity:    Worry: Not on file    Inability: Not on file  . Transportation needs:    Medical: Not on file    Non-medical: Not on file  Tobacco Use  . Smoking status: Never Smoker  . Smokeless tobacco: Never Used  Substance and Sexual Activity  . Alcohol use: Yes    Alcohol/week: 0.0 standard drinks    Comment: socially , 2 to 3 drinks  a day    . Drug use: No  . Sexual activity: Not on file  Lifestyle  . Physical activity:    Days per week: Not on file    Minutes per session: Not on file  . Stress: Not on file  Relationships  . Social connections:    Talks on phone: Not on file    Gets together: Not on file    Attends religious service: Not on file    Active member of club or organization: Not on file    Attends meetings of clubs or organizations: Not on file    Relationship status: Not on file  . Intimate partner violence:    Fear of current or ex partner: Not on file    Emotionally abused: Not on file    Physically abused: Not on file    Forced sexual activity: Not on file  Other Topics Concern  . Not on file  Social History Narrative   "Bobby [name]", Married,    2 children   mom passed away Jun 06, 2008   Right-handed.   2-3 cups coffee per day.   Lives at home with wife.               Allergies as of 09/11/2018  Reactions   Tramadol    REACTION: hives      Medication List        Accurate as of 09/11/18  1:53 PM. Always use your most recent med list.          carvedilol 6.25 MG tablet Commonly known as:  COREG Take 1 tablet (6.25 mg total) by mouth 2 (two) times daily with a meal.   clonazePAM 0.5 MG tablet Commonly known as:  KLONOPIN Take 1 tablet (0.5 mg total) by mouth 2 (two) times daily as needed for anxiety.   folic acid 1 MG tablet Commonly known as:  FOLVITE Take 1 tablet (1 mg total) by mouth daily.   levocetirizine 5 MG tablet Commonly known as:  XYZAL Take 1 tablet (5 mg total) by mouth daily as needed for allergies.   multivitamin with minerals Tabs tablet Take 1 tablet by mouth daily.   NONFORMULARY OR COMPOUNDED ITEM Testosterone  5% gel apply. Apply 3 clicks on each arm every morning.   olopatadine 0.1 % ophthalmic solution Commonly known as:  PATANOL Place 1 drop into both eyes 2 (two) times daily.   sildenafil 20 MG tablet Commonly known as:  REVATIO Take 3-4  tablets (60-80 mg total) by mouth at bedtime as needed.   thiamine 100 MG tablet Take 1 tablet (100 mg total) by mouth daily.   zolpidem 10 MG tablet Commonly known as:  AMBIEN Take 1 tablet (10 mg total) by mouth at bedtime as needed for sleep.          Objective:   Physical Exam BP 116/70 (BP Location: Left Arm, Patient Position: Sitting, Cuff Size: Small)   Pulse 69   Temp 98 F (36.7 C) (Oral)   Resp 16   Ht 6' (1.829 m)   Wt 184 lb 4 oz (83.6 kg)   SpO2 96%   BMI 24.99 kg/m  General:   Well developed, NAD, see BMI.  HEENT:  Normocephalic . Face atraumatic, see picture, symmetric.  Palpation of the orbits: No TTP. EOMI, pupils equal and reactive. Neck: No TTP, range of motion normal Lungs:  CTA B Normal respiratory effort, no intercostal retractions, no accessory muscle use. Chest wall: No TTP, he has a small ecchymosis near the right nipple. Heart: RRR,  no murmur.  No pretibial edema bilaterally  Skin: Not pale. Not jaundice Neurologic:  alert & oriented X3.  Speech normal, gait appropriate for age and unassisted.  Motor normal Psych--  Cognition and judgment appear intact.  Cooperative with normal attention span and concentration.  Behavior appropriate. No anxious or depressed appearing.        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 MCI per neuro visit 12/2017 Hyponatremia onset 2014, admitted. 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 Facial trauma: We will get a CT head and CT face to be sure there is no fracture, he does not seem to have any major complications as he does not have headache or nausea.  If CT is normal, will recommend Tylenol and observation Chest wall injury: He probably has a  contusion, I do not think a x-ray will change management consequently will observe for now, pain has actually decreasing

## 2018-09-12 ENCOUNTER — Ambulatory Visit (HOSPITAL_BASED_OUTPATIENT_CLINIC_OR_DEPARTMENT_OTHER)
Admission: RE | Admit: 2018-09-12 | Discharge: 2018-09-12 | Disposition: A | Discharge status: Home / Self Care | Payer: Medicare Other | Source: Ambulatory Visit | Attending: Internal Medicine | Admitting: Internal Medicine

## 2018-09-12 DIAGNOSIS — S0990XA Unspecified injury of head, initial encounter: Secondary | ICD-10-CM | POA: Insufficient documentation

## 2018-09-12 DIAGNOSIS — W19XXXA Unspecified fall, initial encounter: Secondary | ICD-10-CM | POA: Insufficient documentation

## 2018-09-12 DIAGNOSIS — S0993XA Unspecified injury of face, initial encounter: Secondary | ICD-10-CM | POA: Diagnosis not present

## 2018-09-12 NOTE — Assessment & Plan Note (Signed)
Fall: Initial encounter, preventions discussed. Facial trauma: We will get a CT head and CT face to be sure there is no fracture, he does not seem to have any major complications as he does not have headache or nausea.  If CT is normal, will recommend Tylenol and observation Chest wall injury: He probably has a contusion, I do not think a x-ray will change management consequently will observe for now, pain has actually decreasing

## 2018-09-14 LAB — PAIN MGMT, PROFILE 8 W/CONF, U
6 ACETYLMORPHINE: NEGATIVE ng/mL (ref <10)
ALPHAHYDROXYMIDAZOLAM: NEGATIVE ng/mL (ref <50)
ALPHAHYDROXYTRIAZOLAM: NEGATIVE ng/mL (ref <50)
Alcohol Metabolites: POSITIVE ng/mL — AB (ref <500)
Alphahydroxyalprazolam: NEGATIVE ng/mL (ref <25)
Aminoclonazepam: 194 ng/mL — ABNORMAL HIGH (ref <25)
Amphetamines: NEGATIVE ng/mL (ref <500)
BENZODIAZEPINES: POSITIVE ng/mL — AB (ref <100)
Buprenorphine, Urine: NEGATIVE ng/mL (ref <5)
Cocaine Metabolite: NEGATIVE ng/mL (ref <150)
Creatinine: 69.2 mg/dL
ETHYL GLUCURONIDE (ETG): 2090 ng/mL — AB (ref <500)
Ethyl Sulfate (ETS): 519 ng/mL — ABNORMAL HIGH (ref <100)
HYDROXYETHYLFLURAZEPAM: NEGATIVE ng/mL (ref <50)
LORAZEPAM: NEGATIVE ng/mL (ref <50)
MARIJUANA METABOLITE: NEGATIVE ng/mL (ref <20)
MDMA: NEGATIVE ng/mL (ref <500)
Nordiazepam: NEGATIVE ng/mL (ref <50)
OXAZEPAM: NEGATIVE ng/mL (ref <50)
OXYCODONE: NEGATIVE ng/mL (ref <100)
Opiates: NEGATIVE ng/mL (ref <100)
Oxidant: NEGATIVE ug/mL (ref <200)
Temazepam: NEGATIVE ng/mL (ref <50)
pH: 6.88 (ref 4.5–9.0)

## 2018-10-01 ENCOUNTER — Telehealth: Payer: Self-pay | Admitting: Internal Medicine

## 2018-10-02 NOTE — Telephone Encounter (Signed)
Pt is requesting refill on clonazepam 0.5mg .   Last OV: 09/11/2018 Last Fill: 04/25/2018 #60 and 2RF UDS: 09/11/2018 Low risk  NCCR printed- no discrepancies noted- sent for scanning

## 2018-10-02 NOTE — Telephone Encounter (Signed)
Sent!

## 2018-10-08 ENCOUNTER — Encounter: Payer: Self-pay | Admitting: Internal Medicine

## 2018-10-30 ENCOUNTER — Ambulatory Visit (INDEPENDENT_AMBULATORY_CARE_PROVIDER_SITE_OTHER): Payer: Medicare Other | Admitting: Internal Medicine

## 2018-10-30 ENCOUNTER — Encounter: Payer: Self-pay | Admitting: Internal Medicine

## 2018-10-30 VITALS — BP 126/74 | HR 62 | Temp 98.2°F | Resp 16 | Ht 72.0 in | Wt 185.1 lb

## 2018-10-30 DIAGNOSIS — G4700 Insomnia, unspecified: Secondary | ICD-10-CM

## 2018-10-30 DIAGNOSIS — E871 Hypo-osmolality and hyponatremia: Secondary | ICD-10-CM

## 2018-10-30 DIAGNOSIS — Z23 Encounter for immunization: Secondary | ICD-10-CM

## 2018-10-30 DIAGNOSIS — I10 Essential (primary) hypertension: Secondary | ICD-10-CM | POA: Diagnosis not present

## 2018-10-30 DIAGNOSIS — F411 Generalized anxiety disorder: Secondary | ICD-10-CM | POA: Diagnosis not present

## 2018-10-30 DIAGNOSIS — E291 Testicular hypofunction: Secondary | ICD-10-CM | POA: Diagnosis not present

## 2018-10-30 LAB — COMPREHENSIVE METABOLIC PANEL
ALT: 19 U/L (ref 0–53)
AST: 23 U/L (ref 0–37)
Albumin: 4.3 g/dL (ref 3.5–5.2)
Alkaline Phosphatase: 62 U/L (ref 39–117)
BILIRUBIN TOTAL: 0.6 mg/dL (ref 0.2–1.2)
BUN: 15 mg/dL (ref 6–23)
CO2: 31 mEq/L (ref 19–32)
Calcium: 9.2 mg/dL (ref 8.4–10.5)
Chloride: 98 mEq/L (ref 96–112)
Creatinine, Ser: 0.85 mg/dL (ref 0.40–1.50)
GFR: 94.84 mL/min (ref >60.00)
Glucose, Bld: 97 mg/dL (ref 70–99)
Potassium: 5.2 mEq/L — ABNORMAL HIGH (ref 3.5–5.1)
Sodium: 134 mEq/L — ABNORMAL LOW (ref 135–145)
Total Protein: 6.5 g/dL (ref 6.0–8.3)

## 2018-10-30 LAB — LIPID PANEL
Cholesterol: 159 mg/dL (ref 0–200)
HDL: 54.4 mg/dL (ref >39.00)
LDL Cholesterol: 84 mg/dL (ref 0–99)
NONHDL: 104.63
Total CHOL/HDL Ratio: 3
Triglycerides: 103 mg/dL (ref 0.0–149.0)
VLDL: 20.6 mg/dL (ref 0.0–40.0)

## 2018-10-30 LAB — PSA: PSA: 3.49 ng/mL (ref 0.10–4.00)

## 2018-10-30 MED ORDER — ZOLPIDEM TARTRATE 10 MG PO TABS: 10.0000 mg | ORAL_TABLET | Freq: Every evening | ORAL | 1 refills | Status: DC | PRN

## 2018-10-30 NOTE — Assessment & Plan Note (Addendum)
-  Td 2010;  zostavax 2014 ;  PNM shot --2015;  prevnar--2016; S/p  shingrix x 2; Flu shot today. -CCS:  had a Cscope aprox 2007, Dr Watt Climes  ---->  repeated Cscope 08-2009 ---->  Tubular adenoma per bx report------> colonoscopy 09-2015, no polyps, 5 years -On HRT, DRE 2018 normal, check a PSA

## 2018-10-30 NOTE — Progress Notes (Signed)
Subjective:    Patient ID: Bobby [name], male    DOB: 05-12-1951, 67 y.o.   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 Also complaining of problems with the right eye, when he uses artificial tears his vision improves.  Dry eye?  Review of Systems  Denies chest pain or difficulty breathing No nausea, vomiting, diarrhea Past Medical History:  Diagnosis Date  . Anxiety and depression   . Hypertension   . Hyponatremia 03-2013   ongoing, seeing Dr. Posey Pronto  . Insomnia   . SIADH (syndrome of inappropriate ADH production) ([place]) 2017   Dr. Posey Pronto    Past Surgical History:  Procedure Laterality Date  . SHOULDER SURGERY  1980s   right shoulder    Social History   Socioeconomic History  . Marital status: Married    Spouse name: Not on file  . Number of children: 2  . Years of education: 16  . Highest education level: Bachelor's degree (e.g., BA, AB, BS)  Occupational History  . Occupation: sretired--business Financial controller   Social Needs  . Financial resource strain: Not on file  . Food insecurity:    Worry: Not on file    Inability: Not on file  . Transportation needs:    Medical: Not on file    Non-medical: Not on file  Tobacco Use  . Smoking status: Never Smoker  . Smokeless tobacco: Never Used  Substance and Sexual Activity  . Alcohol use: Yes    Alcohol/week: 0.0 standard drinks    Comment: socially , 2 to 3 drinks  a day  . Drug use: No  . Sexual activity: Yes  Lifestyle  . Physical activity:    Days per week: Not on file    Minutes per session: Not on file  . Stress: Not on file  Relationships  . Social connections:    Talks on phone: Not on file    Gets together: Not on file    Attends religious service: Not on file    Active member of club or  organization: Not on file    Attends meetings of clubs or organizations: Not on file    Relationship status: Not on file  . Intimate partner violence:    Fear of current or ex partner: Not on file    Emotionally abused: Not on file    Physically abused: Not on file    Forced sexual activity: Not on file  Other Topics Concern  . Not on file  Social History Narrative   [name], Married,    2 children   mom passed away 06/10/08   Right-handed.   2-3 cups coffee per day.   Lives at home with wife.               Allergies as of 10/30/2018      Reactions   Tramadol    REACTION: hives      Medication List        Accurate as of 10/30/18  6:47 PM. Always use your most recent med list.          carvedilol 6.25 MG tablet Commonly known as:  COREG Take 1 tablet (6.25 mg total) by mouth 2 (two) times daily with a meal.   clonazePAM 0.5 MG tablet Commonly known as:  KLONOPIN TAKE ONE TABLET BY MOUTH TWICE A DAY AS NEEDED FOR   ANXIETY   folic acid 1 MG tablet Commonly known as:  FOLVITE Take 1 tablet (1 mg total) by mouth daily.   levocetirizine 5 MG tablet Commonly known as:  XYZAL Take 1 tablet (5 mg total) by mouth daily as needed for allergies.   multivitamin with minerals Tabs tablet Take 1 tablet by mouth daily.   NONFORMULARY OR COMPOUNDED ITEM Testosterone  5% gel apply. Apply 3 clicks on each arm every morning.   olopatadine 0.1 % ophthalmic solution Commonly known as:  PATANOL Place 1 drop into both eyes 2 (two) times daily.   sildenafil 20 MG tablet Commonly known as:  REVATIO Take 3-4 tablets (60-80 mg total) by mouth at bedtime as needed.   thiamine 100 MG tablet Take 1 tablet (100 mg total) by mouth daily.   zolpidem 10 MG tablet Commonly known as:  AMBIEN Take 1 tablet (10 mg total) by mouth at bedtime as needed for sleep.           Objective:   Physical Exam BP 126/74 (BP Location: Left Arm, Patient Position: Sitting, Cuff Size:  Small)   Pulse 62   Temp 98.2 F (36.8 C) (Oral)   Resp 16   Ht 6' (1.829 m)   Wt 185 lb 2 oz (84 kg)   SpO2 97%   BMI 25.11 kg/m  General: Well developed, NAD, BMI noted Neck: No  thyromegaly  HEENT:  Normocephalic . Face symmetric, atraumatic Lungs:  CTA B Normal respiratory effort, no intercostal retractions, no accessory muscle use. Heart: RRR,  no murmur.  No pretibial edema bilaterally  Abdomen:  Not distended, soft, non-tender. No rebound or rigidity.   Skin: Exposed areas without rash. Not pale. Not jaundice Neurologic:  alert & oriented X3.  Speech normal, gait appropriate for age and unassisted Strength symmetric and appropriate for age.  Psych: Cognition and judgment appear intact.  Cooperative with normal attention span and concentration.  Behavior appropriate. No anxious or depressed appearing.     Assessment & Plan:    Assessment  HTN Anxiety, depression, insomnia : used lexapro, d/c (decreased libido) Daily etoh use  Hypogonadotrophic Hypogonadism: per  Dr Dwyane Dee , on HRT Memory problems? MCI per neuro visit 12/2017 Hyponatremia onset 2014, admitted. 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  Check a lipid panel Anxiety, depression, insomnia: On clonazepam.  Refill Ambien.  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 Dwyane Dee, check a PSA Hyponatremia: Check a CMP. Dry eye?  Recommend to see Dr. Marica Otter his optometrist. Preventive care discussed. RTC 6 months

## 2018-10-30 NOTE — Patient Instructions (Addendum)
Please schedule Medicare Wellness with Glenard Haring.    GO TO THE LAB : Get the blood work     GO TO THE FRONT DESK Schedule your next appointment for a  Check up in 6 months

## 2018-10-30 NOTE — Assessment & Plan Note (Signed)
HTN: Seems well controlled on carvedilol.  Check a lipid panel Anxiety, depression, insomnia: On clonazepam.  Refill Ambien.  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 Dwyane Dee, check a PSA Hyponatremia: Check a CMP. Dry eye?  Recommend to see Dr. Marica Otter his optometrist. Preventive care discussed. RTC 6 months

## 2018-10-30 NOTE — Progress Notes (Signed)
Pre visit review using our clinic review tool, if applicable. No additional management support is needed unless otherwise documented below in the visit note. 

## 2018-11-02 ENCOUNTER — Encounter: Payer: Self-pay | Admitting: Internal Medicine

## 2018-11-11 DIAGNOSIS — H04201 Unspecified epiphora, right lacrimal gland: Secondary | ICD-10-CM | POA: Diagnosis not present

## 2018-11-11 DIAGNOSIS — H02834 Dermatochalasis of left upper eyelid: Secondary | ICD-10-CM | POA: Diagnosis not present

## 2018-11-11 DIAGNOSIS — H02831 Dermatochalasis of right upper eyelid: Secondary | ICD-10-CM | POA: Diagnosis not present

## 2018-12-04 ENCOUNTER — Other Ambulatory Visit: Payer: Self-pay | Admitting: Endocrinology

## 2018-12-04 ENCOUNTER — Other Ambulatory Visit: Payer: Medicare Other

## 2018-12-04 ENCOUNTER — Other Ambulatory Visit: Payer: Self-pay | Admitting: Internal Medicine

## 2018-12-04 DIAGNOSIS — E871 Hypo-osmolality and hyponatremia: Secondary | ICD-10-CM | POA: Diagnosis not present

## 2018-12-04 DIAGNOSIS — E291 Testicular hypofunction: Secondary | ICD-10-CM | POA: Diagnosis not present

## 2018-12-04 DIAGNOSIS — E559 Vitamin D deficiency, unspecified: Secondary | ICD-10-CM | POA: Diagnosis not present

## 2018-12-04 LAB — BASIC METABOLIC PANEL
BUN: 14 (ref 4–21)
Creatinine: 0.8 (ref 0.6–1.3)
Glucose: 98
POTASSIUM: 5.2 (ref 3.4–5.3)
Sodium: 134 — AB (ref 137–147)

## 2018-12-04 LAB — VITAMIN D 25 HYDROXY (VIT D DEFICIENCY, FRACTURES): Vit D, 25-Hydroxy: 30.7

## 2018-12-05 LAB — TESTOSTERONE: Testosterone: 286 ng/dL (ref 264–916)

## 2018-12-07 ENCOUNTER — Telehealth: Payer: Self-pay | Admitting: Internal Medicine

## 2018-12-09 NOTE — Telephone Encounter (Signed)
Sent!

## 2018-12-09 NOTE — Telephone Encounter (Signed)
Pt is requesting refill on clonazepam.   Last OV: 10/30/2018 Last Fill: 10/02/2018 #60 and 1rf UDS: 09/11/2018 low risk  NCCR in media 10/04/2018

## 2018-12-11 ENCOUNTER — Ambulatory Visit (INDEPENDENT_AMBULATORY_CARE_PROVIDER_SITE_OTHER): Payer: Medicare HMO | Admitting: Endocrinology

## 2018-12-11 ENCOUNTER — Encounter: Payer: Self-pay | Admitting: Endocrinology

## 2018-12-11 VITALS — BP 122/82 | HR 71 | Ht 72.0 in | Wt 185.6 lb

## 2018-12-11 DIAGNOSIS — E291 Testicular hypofunction: Secondary | ICD-10-CM

## 2018-12-11 DIAGNOSIS — H02882 Meibomian gland dysfunction right lower eyelid: Secondary | ICD-10-CM | POA: Diagnosis not present

## 2018-12-11 NOTE — Progress Notes (Signed)
Patient ID: Bobby [name], male   DOB: [birth year], 68 y.o.   MRN: [id number]          Chief complaint: Low testosterone, follow-up  History of Present Illness  The patient was found to have hypogonadism when he was having symptoms of decreased libido, decreased motivation but no significant fatigue.  He was also concerned because of family History of hypogonadism and his brother The exam showed left testicle 3-3.5 cm and right testicle 3 cm.Both testicles are relatively soft   Prior lab results show baselinetestosterone level of 307, done twice in 5/16 Free testosterone was slightly low on the first test but low normal at 6.9 on the second test  He has been on testosterone supplementation starting in 05/2015 with Axiron which previously has been the preferred drug on his plan With testosterone supplementation his libido, motivation and energy has previously improved  He has had difficulty keeping his testosterone levels normal since 2018  RECENT history: In late 2018 he had difficulty affording his Axiron and other brand name testosterone products Also has had variable compliance with applying testosterone supplements   Without his supplement his testosterone level has been as low as 160 in the past  He is also been irregular with his follow-up Although he has been taking the custom formulated testosterone gel, 3 pumps on each arm he ran out of this about 3 weeks ago Otherwise he thinks he has been taking this fairly regularly  He does not think he feels any different with stopping the testosterone gel and has only minimal change in his energy level, libido or motivation He does not appear to be having as many symptoms recently as when he first got diagnosed  Also his testosterone level is now normal although down from a different lab   Lab Results  Component Value Date   TESTOSTERONE 286 12/04/2018   TESTOSTERONE 190.78 (L) 02/18/2018   TESTOSTERONE 160.45 (L) 12/26/2017     TESTOSTERONE 170.98 (L) 05/09/2017   TESTOSTERONE 403.28 11/06/2016    Previous labs: Prolactin level: 7.8 LH level: 3.9    Wt Readings from Last 3 Encounters:  12/11/18 185 lb 9.6 oz (84.2 kg)  10/30/18 185 lb 2 oz (84 kg)  09/11/18 184 lb 4 oz (83.6 kg)    Allergies as of 12/11/2018      Reactions   Tramadol    REACTION: hives      Medication List       Accurate as of December 11, 2018  9:09 AM. Always use your most recent med list.        carvedilol 6.25 MG tablet Commonly known as:  COREG Take 1 tablet (6.25 mg total) by mouth 2 (two) times daily with a meal.   clonazePAM 0.5 MG tablet Commonly known as:  KLONOPIN TAKE ONE TABLET BY MOUTH TWICE A DAY AS NEEDED FOR ANXIETY   folic acid 1 MG tablet Commonly known as:  FOLVITE Take 1 tablet (1 mg total) by mouth daily.   levocetirizine 5 MG tablet Commonly known as:  XYZAL Take 1 tablet (5 mg total) by mouth daily as needed for allergies.   multivitamin with minerals Tabs tablet Take 1 tablet by mouth daily.   NONFORMULARY OR COMPOUNDED ITEM Testosterone  5% gel apply. Apply 3 clicks on each arm every morning.   olopatadine 0.1 % ophthalmic solution Commonly known as:  PATANOL Place 1 drop into both eyes 2 (two) times daily.   sildenafil 20 MG tablet Commonly  known as:  REVATIO Take 3-4 tablets (60-80 mg total) by mouth at bedtime as needed.   thiamine 100 MG tablet Take 1 tablet (100 mg total) by mouth daily.   zolpidem 10 MG tablet Commonly known as:  AMBIEN Take 1 tablet (10 mg total) by mouth at bedtime as needed for sleep.       Allergies:  Allergies  Allergen Reactions  . Tramadol     REACTION: hives    Past Medical History:  Diagnosis Date  . Anxiety and depression   . Hypertension   . Hyponatremia 03-2013   ongoing, seeing Dr. Posey Pronto  . Insomnia   . SIADH (syndrome of inappropriate ADH production) ([place]) 2017   Dr. Posey Pronto    Past Surgical History:  Procedure Laterality  Date  . SHOULDER SURGERY  1980s   right shoulder    Family History  Problem Relation Age of Onset  . Hypertension Mother   . Stroke Mother   . Diabetes Mother   . Emphysema Mother        smoked  . Thyroid disease Mother   . Alcoholism Father   . Colon cancer Neg Hx   . Prostate cancer Neg Hx   . CAD Neg Hx     Social History:  reports that he has never smoked. He has never used smokeless tobacco. He reports current alcohol use. He reports that he does not use drugs.  ROS    PSA has been slightly higher recently, previously is about 2-3, followed by PCP   Lab Results  Component Value Date   PSA 3.49 10/30/2018   PSA 2.33 08/21/2017   PSA 2.97 06/21/2016     General Examination:   BP 122/82 (BP Location: Left Arm, Patient Position: Sitting, Cuff Size: Normal)   Pulse 71   Ht 6' (1.829 m)   Wt 185 lb 9.6 oz (84.2 kg)   SpO2 96%   BMI 25.17 kg/m    Assessment/ Plan:   Hypogonadotropic hypogonadism, with mild symptoms  He was diagnosed with a level of low free testosterone initially.  Currently is using the custom formulated testosterone gel, 3 pump on each arm He has been quite irregular with his follow-up Also with running out of his testosterone gel about 3 weeks ago appears that he is not significantly symptomatic at all and does not have any convincing symptoms related to hypogonadism Also his testosterone level from [place] now is 280 which is higher than before Not clear if improved testosterone may be from his recent weight loss  He agrees to try leaving off the testosterone gel for another 2 months to see if he is still having persistently normal levels Also he will call if he has significant increase in fatigue or issues with libido  Erectile dysfunction: Currently managed by PCP with sildenafil and has adequate response to this  There are no Patient Instructions on file for this visit.   Elayne Snare 12/11/2018, 9:09 AM

## 2019-01-13 DIAGNOSIS — E559 Vitamin D deficiency, unspecified: Secondary | ICD-10-CM | POA: Diagnosis not present

## 2019-01-13 DIAGNOSIS — E871 Hypo-osmolality and hyponatremia: Secondary | ICD-10-CM | POA: Diagnosis not present

## 2019-01-13 DIAGNOSIS — I10 Essential (primary) hypertension: Secondary | ICD-10-CM | POA: Diagnosis not present

## 2019-01-20 ENCOUNTER — Encounter: Payer: Self-pay | Admitting: Internal Medicine

## 2019-01-28 ENCOUNTER — Telehealth: Payer: Self-pay | Admitting: Endocrinology

## 2019-01-28 NOTE — Telephone Encounter (Signed)
Requisitions faxed

## 2019-01-28 NOTE — Telephone Encounter (Signed)
Patient is at [facility] off of [address]. And they can not see Dr.Kumars orders. Please Fax [phone].  Thanks

## 2019-01-29 ENCOUNTER — Other Ambulatory Visit: Payer: Self-pay | Admitting: Endocrinology

## 2019-01-29 DIAGNOSIS — E291 Testicular hypofunction: Secondary | ICD-10-CM | POA: Diagnosis not present

## 2019-01-30 LAB — TESTOSTERONE: Testosterone: 281 ng/dL (ref 264–916)

## 2019-01-30 LAB — LUTEINIZING HORMONE: LH: 5.4 m[IU]/mL (ref 1.7–8.6)

## 2019-02-04 ENCOUNTER — Ambulatory Visit: Payer: Medicare HMO | Admitting: Endocrinology

## 2019-02-04 NOTE — Telephone Encounter (Signed)
Patient has arrived for appointment today and due to [facility] not postings lab results we are having to reschedule. Patient will be coming in tomorrow at 9, please call [facility] off of [address] for results.

## 2019-02-05 ENCOUNTER — Ambulatory Visit (INDEPENDENT_AMBULATORY_CARE_PROVIDER_SITE_OTHER): Payer: Medicare HMO | Admitting: Endocrinology

## 2019-02-05 ENCOUNTER — Encounter: Payer: Self-pay | Admitting: Endocrinology

## 2019-02-05 ENCOUNTER — Other Ambulatory Visit: Payer: Self-pay

## 2019-02-05 VITALS — BP 132/78 | HR 84 | Temp 97.8°F | Ht 72.0 in | Wt 188.2 lb

## 2019-02-05 DIAGNOSIS — N521 Erectile dysfunction due to diseases classified elsewhere: Secondary | ICD-10-CM | POA: Diagnosis not present

## 2019-02-05 NOTE — Progress Notes (Signed)
Patient ID: Bobby [name], male   DOB: [birth year], 68 y.o.   MRN: [id number]          Chief complaint: Low testosterone, follow-up  History of Present Illness  The patient was found to have hypogonadism when he was having symptoms of decreased libido, decreased motivation but no significant fatigue.  He was also concerned because of family History of hypogonadism and his brother The exam showed left testicle 3-3.5 cm and right testicle 3 cm.Both testicles are relatively soft   Prior lab results show baselinetestosterone level of 307, done twice in 5/16 Free testosterone was slightly low on the first test but low normal at 6.9 on the second test  He has been on testosterone supplementation starting in 05/2015 with Axiron which previously has been the preferred drug on his plan With testosterone supplementation his libido, motivation and energy has previously improved  He has had difficulty keeping his testosterone levels normal since 2018  RECENT history: In late 2018 he had difficulty affording his Axiron and other brand name testosterone products Also has had variable compliance with applying testosterone supplements  Without his supplement his testosterone level has been as low as 160 in the past  Previously been taking the custom formulated testosterone gel, 3 pumps on each arm he ran out of this prescription late December 2019 However even with stopping the testosterone gel he did not feel any onset of fatigue, decreased energy, motivation or libido His testosterone level had come up to 286 Because of his improved level and lack of symptoms he was advised to stay off of testosterone supplements and come back for follow-up  Again he feels fairly good with no recurrence of his decreased energy level He still uses sildenafil as treatment for erectile dysfunction as before  Currently his testosterone level is stable at 281 and LH is normal at 5.4   Lab Results  Component  Value Date   TESTOSTERONE 286 12/04/2018   TESTOSTERONE 190.78 (L) 02/18/2018   TESTOSTERONE 160.45 (L) 12/26/2017   TESTOSTERONE 170.98 (L) 05/09/2017   TESTOSTERONE 403.28 11/06/2016    Previous labs: Prolactin level: 7.8 LH level: 3.9    Wt Readings from Last 3 Encounters:  02/05/19 188 lb 3.2 oz (85.4 kg)  12/11/18 185 lb 9.6 oz (84.2 kg)  10/30/18 185 lb 2 oz (84 kg)    Allergies as of 02/05/2019      Reactions   Tramadol    REACTION: hives      Medication List       Accurate as of February 05, 2019  9:32 AM. Always use your most recent med list.        carvedilol 6.25 MG tablet Commonly known as:  COREG Take 1 tablet (6.25 mg total) by mouth 2 (two) times daily with a meal.   clonazePAM 0.5 MG tablet Commonly known as:  KLONOPIN TAKE ONE TABLET BY MOUTH TWICE A DAY AS NEEDED FOR ANXIETY   folic acid 1 MG tablet Commonly known as:  FOLVITE Take 1 tablet (1 mg total) by mouth daily.   levocetirizine 5 MG tablet Commonly known as:  XYZAL Take 1 tablet (5 mg total) by mouth daily as needed for allergies.   multivitamin with minerals Tabs tablet Take 1 tablet by mouth daily.   NONFORMULARY OR COMPOUNDED ITEM Testosterone  5% gel apply. Apply 3 clicks on each arm every morning.   olopatadine 0.1 % ophthalmic solution Commonly known as:  PATANOL Place 1 drop into both  eyes 2 (two) times daily.   sildenafil 20 MG tablet Commonly known as:  REVATIO Take 3-4 tablets (60-80 mg total) by mouth at bedtime as needed.   thiamine 100 MG tablet Take 1 tablet (100 mg total) by mouth daily.   zolpidem 10 MG tablet Commonly known as:  AMBIEN Take 1 tablet (10 mg total) by mouth at bedtime as needed for sleep.       Allergies:  Allergies  Allergen Reactions  . Tramadol     REACTION: hives    Past Medical History:  Diagnosis Date  . Anxiety and depression   . Hypertension   . Hyponatremia 03-2013   ongoing, seeing Dr. Posey Pronto  . Insomnia   . SIADH  (syndrome of inappropriate ADH production) ([place]) 2017   Dr. Posey Pronto    Past Surgical History:  Procedure Laterality Date  . SHOULDER SURGERY  1980s   right shoulder    Family History  Problem Relation Age of Onset  . Hypertension Mother   . Stroke Mother   . Diabetes Mother   . Emphysema Mother        smoked  . Thyroid disease Mother   . Alcoholism Father   . Colon cancer Neg Hx   . Prostate cancer Neg Hx   . CAD Neg Hx     Social History:  reports that he has never smoked. He has never used smokeless tobacco. He reports current alcohol use. He reports that he does not use drugs.  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  Since about the beginning of the year he has not been on any testosterone supplement He is asymptomatic and does not complain of any lethargy, fatigue, weakness or decreased libido He does have erectile dysfunction which is unlikely to be related to hypogonadism  At this time his testosterone level is in the normal range and improved from before Considering his age his testosterone level is adequate Discussed that he does not necessarily need treatment simply because of the low normal level and may have more adverse effects long-term if testosterone is used in older patients  Erectile dysfunction: Currently managed by PCP with sildenafil and can continue to use this as needed  Follow-up with PCP  If he has recurrence of significant fatigue, decreased libido will reevaluate  There are no  Patient Instructions on file for this visit.   Elayne Snare 02/05/2019, 9:32 AM

## 2019-02-07 ENCOUNTER — Other Ambulatory Visit: Payer: Self-pay | Admitting: Internal Medicine

## 2019-02-07 MED ORDER — SILDENAFIL CITRATE 20 MG PO TABS: 60.0000 mg | ORAL_TABLET | Freq: Every evening | ORAL | 3 refills | Status: DC | PRN

## 2019-03-19 ENCOUNTER — Encounter: Payer: Self-pay | Admitting: Internal Medicine

## 2019-03-19 MED ORDER — CLONAZEPAM 0.5 MG PO TABS: ORAL_TABLET | ORAL | 2 refills | Status: DC

## 2019-03-19 NOTE — Telephone Encounter (Signed)
Sent!

## 2019-03-19 NOTE — Telephone Encounter (Signed)
Pt is requesting refill on clonazepam.   Last OV: 10/30/2018 Last Fill: 12/09/2018 #60 and 2RF UDS: 09/11/2018 Low risk

## 2019-04-16 DIAGNOSIS — N529 Male erectile dysfunction, unspecified: Secondary | ICD-10-CM | POA: Diagnosis not present

## 2019-04-16 DIAGNOSIS — F419 Anxiety disorder, unspecified: Secondary | ICD-10-CM | POA: Diagnosis not present

## 2019-04-16 DIAGNOSIS — J309 Allergic rhinitis, unspecified: Secondary | ICD-10-CM | POA: Diagnosis not present

## 2019-04-16 DIAGNOSIS — R69 Illness, unspecified: Secondary | ICD-10-CM | POA: Diagnosis not present

## 2019-04-16 DIAGNOSIS — I10 Essential (primary) hypertension: Secondary | ICD-10-CM | POA: Diagnosis not present

## 2019-04-16 DIAGNOSIS — G4700 Insomnia, unspecified: Secondary | ICD-10-CM | POA: Diagnosis not present

## 2019-05-13 ENCOUNTER — Telehealth: Payer: Self-pay

## 2019-05-13 MED ORDER — ZOLPIDEM TARTRATE 10 MG PO TABS: 10.0000 mg | ORAL_TABLET | Freq: Every evening | ORAL | 0 refills | Status: DC | PRN

## 2019-05-13 NOTE — Telephone Encounter (Signed)
Ambien refill.   Last OV: 10/30/2018 Last Fill: 10/30/2018 #90 and 1RF UDS: 09/11/2018 Low risk

## 2019-05-13 NOTE — Telephone Encounter (Signed)
Copied from [place] [id number]. Topic: Quick Communication - Rx Refill/Question >> May 13, 2019 11:02 AM Carolyn Stare wrote: Medication zolpidem (AMBIEN) 10 MG tablet  Preferred Pharmacy [facility]  Agent: Please be advised that RX refills may take up to 3 business days. We ask that you follow-up with your pharmacy.

## 2019-05-13 NOTE — Telephone Encounter (Signed)
Sent!

## 2019-05-14 DIAGNOSIS — R69 Illness, unspecified: Secondary | ICD-10-CM | POA: Diagnosis not present

## 2019-05-16 ENCOUNTER — Other Ambulatory Visit: Payer: Self-pay | Admitting: Internal Medicine

## 2019-05-26 ENCOUNTER — Other Ambulatory Visit: Payer: Self-pay

## 2019-05-28 ENCOUNTER — Other Ambulatory Visit: Payer: Self-pay

## 2019-05-28 ENCOUNTER — Ambulatory Visit (INDEPENDENT_AMBULATORY_CARE_PROVIDER_SITE_OTHER): Payer: Medicare HMO | Admitting: Internal Medicine

## 2019-05-28 ENCOUNTER — Encounter: Payer: Self-pay | Admitting: Internal Medicine

## 2019-05-28 VITALS — BP 148/81 | HR 58 | Temp 98.1°F | Resp 16 | Ht 72.0 in | Wt 184.2 lb

## 2019-05-28 DIAGNOSIS — G4700 Insomnia, unspecified: Secondary | ICD-10-CM | POA: Diagnosis not present

## 2019-05-28 DIAGNOSIS — Z79899 Other long term (current) drug therapy: Secondary | ICD-10-CM

## 2019-05-28 DIAGNOSIS — R972 Elevated prostate specific antigen [PSA]: Secondary | ICD-10-CM

## 2019-05-28 DIAGNOSIS — F411 Generalized anxiety disorder: Secondary | ICD-10-CM | POA: Diagnosis not present

## 2019-05-28 DIAGNOSIS — R69 Illness, unspecified: Secondary | ICD-10-CM | POA: Diagnosis not present

## 2019-05-28 DIAGNOSIS — E559 Vitamin D deficiency, unspecified: Secondary | ICD-10-CM

## 2019-05-28 DIAGNOSIS — I10 Essential (primary) hypertension: Secondary | ICD-10-CM

## 2019-05-28 DIAGNOSIS — E871 Hypo-osmolality and hyponatremia: Secondary | ICD-10-CM

## 2019-05-28 LAB — CBC WITH DIFFERENTIAL/PLATELET
Basophils Absolute: 0.1 10*3/uL (ref 0.0–0.1)
Basophils Relative: 0.6 % (ref 0.0–3.0)
Eosinophils Absolute: 0.2 10*3/uL (ref 0.0–0.7)
Eosinophils Relative: 2.3 % (ref 0.0–5.0)
HCT: 39 % (ref 39.0–52.0)
Hemoglobin: 13.4 g/dL (ref 13.0–17.0)
Lymphocytes Relative: 52.2 % — ABNORMAL HIGH (ref 12.0–46.0)
Lymphs Abs: 4.8 10*3/uL — ABNORMAL HIGH (ref 0.7–4.0)
MCHC: 34.4 g/dL (ref 30.0–36.0)
MCV: 94.7 fl (ref 78.0–100.0)
Monocytes Absolute: 0.6 10*3/uL (ref 0.1–1.0)
Monocytes Relative: 6.5 % (ref 3.0–12.0)
Neutro Abs: 3.5 10*3/uL (ref 1.4–7.7)
Neutrophils Relative %: 38.4 % — ABNORMAL LOW (ref 43.0–77.0)
Platelets: 218 10*3/uL (ref 150.0–400.0)
RBC: 4.12 Mil/uL — ABNORMAL LOW (ref 4.22–5.81)
RDW: 13.3 % (ref 11.5–15.5)
WBC: 9.1 10*3/uL (ref 4.0–10.5)

## 2019-05-28 LAB — BASIC METABOLIC PANEL
BUN: 19 mg/dL (ref 6–23)
CO2: 27 mEq/L (ref 19–32)
Calcium: 9.2 mg/dL (ref 8.4–10.5)
Chloride: 95 mEq/L — ABNORMAL LOW (ref 96–112)
Creatinine, Ser: 0.99 mg/dL (ref 0.40–1.50)
GFR: 74.71 mL/min (ref >60.00)
Glucose, Bld: 97 mg/dL (ref 70–99)
Potassium: 4.9 mEq/L (ref 3.5–5.1)
Sodium: 131 mEq/L — ABNORMAL LOW (ref 135–145)

## 2019-05-28 LAB — PSA: PSA: 2.85 ng/mL (ref 0.10–4.00)

## 2019-05-28 NOTE — Progress Notes (Signed)
Pre visit review using our clinic review tool, if applicable. No additional management support is needed unless otherwise documented below in the visit note. 

## 2019-05-28 NOTE — Progress Notes (Signed)
Subjective:    Patient ID: Bobby [name], male    DOB: 1950-12-12, 68 y.o.   MRN: [id number]  DOS:  05/28/2019 Type of visit - description: rov HTN: BP slightly elevated today, no ambulatory BPs Anxiety insomnia: In the last few months his stress has decreased, he is doing well, has decrease the use of Ambien Hyponatremia: Note from nephrology reviewed. Low testosterone: Note from endocrine reviewed  BP Readings from Last 3 Encounters:  05/28/19 (!) 148/81  02/05/19 132/78  12/11/18 122/82    Review of Systems  Denies fever chills No chest pain no difficulty breathing No cough No hematuria, difficulty urinating or dysuria.  Past Medical History:  Diagnosis Date  . Anxiety and depression   . Hypertension   . Hyponatremia 03-2013   ongoing, seeing Bobby [name]  . Insomnia   . SIADH (syndrome of inappropriate ADH production) ([place]) 2017   Bobby [name]    Past Surgical History:  Procedure Laterality Date  . SHOULDER SURGERY  1980s   right shoulder    Social History   Socioeconomic History  . Marital status: Married    Spouse name: Not on file  . Number of children: 2  . Years of education: 16  . Highest education level: Bachelor's degree (e.g., BA, AB, BS)  Occupational History  . Occupation: semi-retired--business owner   Social Needs  . Financial resource strain: Not on file  . Food insecurity    Worry: Not on file    Inability: Not on file  . Transportation needs    Medical: Not on file    Non-medical: Not on file  Tobacco Use  . Smoking status: Never Smoker  . Smokeless tobacco: Never Used  Substance and Sexual Activity  . Alcohol use: Yes    Alcohol/week: 0.0 standard drinks    Comment: socially , 2 to 3 drinks  a day  . Drug use: No  . Sexual activity: Yes  Lifestyle  . Physical activity    Days per week: Not on file    Minutes per session: Not on file  . Stress: Not on file  Relationships  . Social Herbalist on phone: Not on file     Gets together: Not on file    Attends religious service: Not on file    Active member of club or organization: Not on file    Attends meetings of clubs or organizations: Not on file    Relationship status: Not on file  . Intimate partner violence    Fear of current or ex partner: Not on file    Emotionally abused: Not on file    Physically abused: Not on file    Forced sexual activity: Not on file  Other Topics Concern  . Not on file  Social History Narrative   "Bobby [name]", Married,    2 children   mom passed away 05/27/08   Right-handed.   2-3 cups coffee per day.   Lives at home with wife.               Allergies as of 05/28/2019      Reactions   Tramadol    REACTION: hives      Medication List       Accurate as of May 28, 2019 11:59 PM. If you have any questions, ask your nurse or doctor.        carvedilol 6.25 MG tablet Commonly known as: COREG Take 1 tablet (6.25 mg  total) by mouth 2 (two) times daily with a meal.   clonazePAM 0.5 MG tablet Commonly known as: KLONOPIN TAKE ONE TABLET BY MOUTH TWICE A DAY AS NEEDED FOR ANXIETY   folic acid 1 MG tablet Commonly known as: FOLVITE Take 1 tablet (1 mg total) by mouth daily.   levocetirizine 5 MG tablet Commonly known as: XYZAL Take 1 tablet (5 mg total) by mouth daily as needed for allergies.   multivitamin with minerals Tabs tablet Take 1 tablet by mouth daily.   NONFORMULARY OR COMPOUNDED ITEM Testosterone  5% gel apply. Apply 3 clicks on each arm every morning.   olopatadine 0.1 % ophthalmic solution Commonly known as: PATANOL Place 1 drop into both eyes 2 (two) times daily.   sildenafil 20 MG tablet Commonly known as: REVATIO Take 3-4 tablets (60-80 mg total) by mouth at bedtime as needed.   thiamine 100 MG tablet Take 1 tablet (100 mg total) by mouth daily.   zolpidem 10 MG tablet Commonly known as: AMBIEN Take 1 tablet (10 mg total) by mouth at bedtime as needed for sleep.            Objective:   Physical Exam BP (!) 148/81 (BP Location: Left Arm, Patient Position: Sitting, Cuff Size: Small)   Pulse (!) 58   Temp 98.1 F (36.7 C) (Oral)   Resp 16   Ht 6' (1.829 m)   Wt 184 lb 4 oz (83.6 kg)   SpO2 99%   BMI 24.99 kg/m  General:   Well developed, NAD, BMI noted. HEENT:  Normocephalic . Face symmetric, atraumatic Lungs:  CTA B Normal respiratory effort, no intercostal retractions, no accessory muscle use. Heart: RRR,  no murmur.  No pretibial edema bilaterally  Skin: Not pale. Not jaundice Neurologic:  alert & oriented X3.  Speech normal, gait appropriate for age and unassisted Psych--  Cognition and judgment appear intact.  Cooperative with normal attention span and concentration.  Behavior appropriate. No anxious or depressed appearing.      Assessment     Assessment  HTN Anxiety, depression, insomnia : used lexapro, d/c (decreased libido) Daily etoh use  Hypogonadotrophic Hypogonadism: per  Bobby [name] , on HRT Memory problems? [facility] per neuro visit 12/2017 Hyponatremia onset 2014, admitted. 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  Check a CBC, continue carvedilol , rec  ambulatory BPs. Anxiety, depression, insomnia: Due for a UDS, continue with clonazepam Hyponatremia, last seen by nephrology 01/13/2019, he was noted to be stable and was rec to RTC 1 year Results from 12/04/2018:Creatinine 0.8, phosphorus 3.6 normal, potassium 5.2 normal, sodium 134 normal. Check BMP Vitamin D deficiency: Per nephrology note   Hypogonadism: Off HRT for several months and last testosterone level normal, was seen by endocrine 02/05/2019, they recommend no HRT at this point.  Reevaluate as needed Skin lesion: Reports skin lesion at the right cheek, exam is confirmatory, she he has a about 1 x 0.5 cm area of scaliness that is  persistent.  Offered a referral, declined, stated will call Bobby [name] office for an appointment. Increased PSA: See labs, recheck today, he is asymptomatic Recommend flu shot this year RTC 10-2019 CPX

## 2019-05-28 NOTE — Patient Instructions (Addendum)
GO TO THE LAB : Get the blood work     GO TO THE FRONT DESK Schedule your next appointment   For a physical by 10/2019  See dermatology, Dr Allyson Sabal office   Check the  blood pressure   weekly   Be sure your blood pressure is between 110/65 and  135/85. If it is consistently higher or lower, let me know

## 2019-05-29 DIAGNOSIS — E559 Vitamin D deficiency, unspecified: Secondary | ICD-10-CM | POA: Insufficient documentation

## 2019-05-29 NOTE — Assessment & Plan Note (Addendum)
  HTN: Currently on carvedilol, BP today slightly elevated, otherwise typically normal.  Check a CBC, continue carvedilol , rec  ambulatory BPs. Anxiety, depression, insomnia: Due for a UDS, continue with clonazepam Hyponatremia, last seen by nephrology 01/13/2019, he was noted to be stable and was rec to RTC 1 year Results from 12/04/2018:Creatinine 0.8, phosphorus 3.6 normal, potassium 5.2 normal, sodium 134 normal. Check BMP Vitamin D deficiency: Per nephrology note   Hypogonadism: Off HRT for several months and last testosterone level normal, was seen by endocrine 02/05/2019, they recommend no HRT at this point.  Reevaluate as needed Skin lesion: Reports skin lesion at the right cheek, exam is confirmatory, she he has a about 1 x 0.5 cm area of scaliness that is persistent.  Offered a referral, declined, stated will call Dr. Allyson Sabal office for an appointment. Increased PSA: See labs, recheck today, he is asymptomatic Recommend flu shot this year RTC 10-2019 CPX

## 2019-05-30 LAB — PAIN MGMT, PROFILE 8 W/CONF, U
6 Acetylmorphine: NEGATIVE ng/mL
Alcohol Metabolites: POSITIVE ng/mL — AB (ref <500)
Alphahydroxyalprazolam: NEGATIVE ng/mL
Alphahydroxymidazolam: NEGATIVE ng/mL
Alphahydroxytriazolam: NEGATIVE ng/mL
Aminoclonazepam: 398 ng/mL
Amphetamines: NEGATIVE ng/mL
Benzodiazepines: POSITIVE ng/mL
Buprenorphine, Urine: NEGATIVE ng/mL
Cocaine Metabolite: NEGATIVE ng/mL
Creatinine: 191.5 mg/dL
Ethyl Glucuronide (ETG): 75933 ng/mL
Ethyl Sulfate (ETS): 8494 ng/mL
Hydroxyethylflurazepam: NEGATIVE ng/mL
Lorazepam: NEGATIVE ng/mL
MDMA: NEGATIVE ng/mL
Marijuana Metabolite: NEGATIVE ng/mL
Nordiazepam: NEGATIVE ng/mL
Opiates: NEGATIVE ng/mL
Oxazepam: NEGATIVE ng/mL
Oxidant: NEGATIVE ug/mL
Oxycodone: NEGATIVE ng/mL
Temazepam: NEGATIVE ng/mL
pH: 6.7 (ref 4.5–9.0)

## 2019-06-16 ENCOUNTER — Other Ambulatory Visit: Payer: Self-pay | Admitting: Internal Medicine

## 2019-06-28 ENCOUNTER — Encounter: Payer: Self-pay | Admitting: Internal Medicine

## 2019-06-30 MED ORDER — CLONAZEPAM 0.5 MG PO TABS: ORAL_TABLET | ORAL | 2 refills | Status: DC

## 2019-06-30 NOTE — Telephone Encounter (Signed)
Clonazepam refill.   Last OV: 05/28/2019 Last Fill: 03/19/2019 #60 and 2rf Pt sig: 1 tab bid prn UDS: 05/28/2019 Low risk

## 2019-06-30 NOTE — Telephone Encounter (Signed)
Prescription sent

## 2019-07-01 IMAGING — CT CT MAXILLOFACIAL W/O CM
4 of 6 series · 16 of 47 positions shown, 18 images · non-contrast
Comparison: None.

CLINICAL DATA: Status post fall. Hit the right side of the face on
asphalt

EXAM:
CT HEAD WITHOUT CONTRAST
CT MAXILLOFACIAL WITHOUT CONTRAST
TECHNIQUE: Multidetector CT imaging of the head and maxillofacial structures
were performed using the standard protocol without intravenous
contrast. Multiplanar CT image reconstructions of the maxillofacial
structures were also generated.

[Series 2: head wo · axial · 0.41mm/px · z∈[-173,-53]mm · 7 of 33 slices shown, 9 images]
[im 5/33  brain]
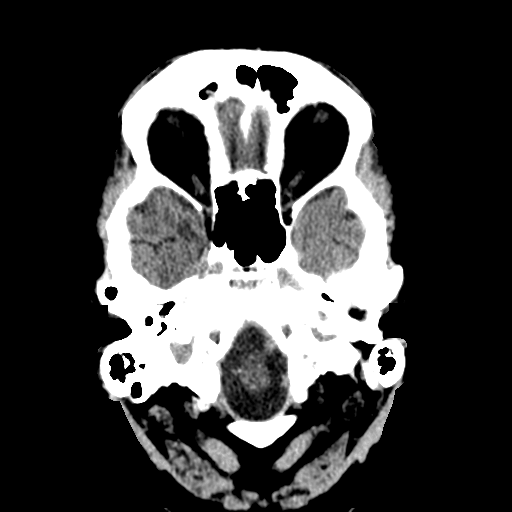
[im 5/33  bone]
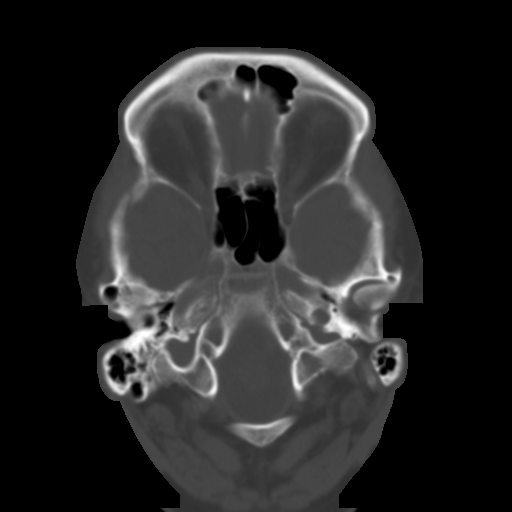
[im 9/33  bone]
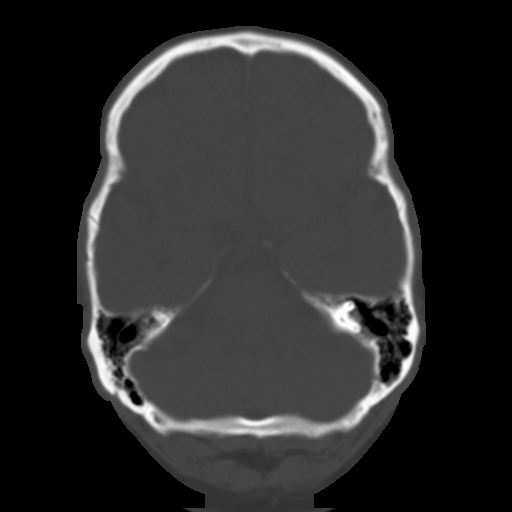
[im 13/33  bone]
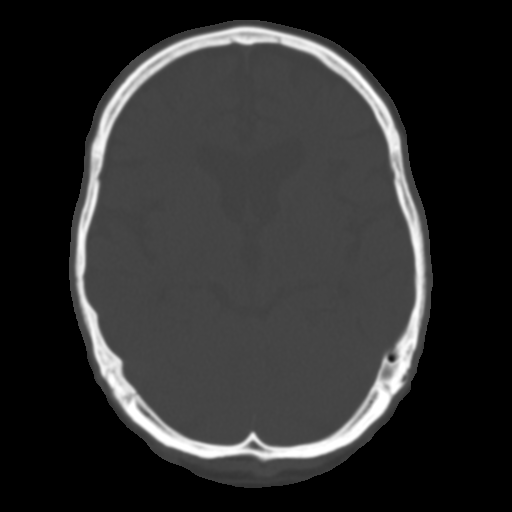
[im 17/33  bone]
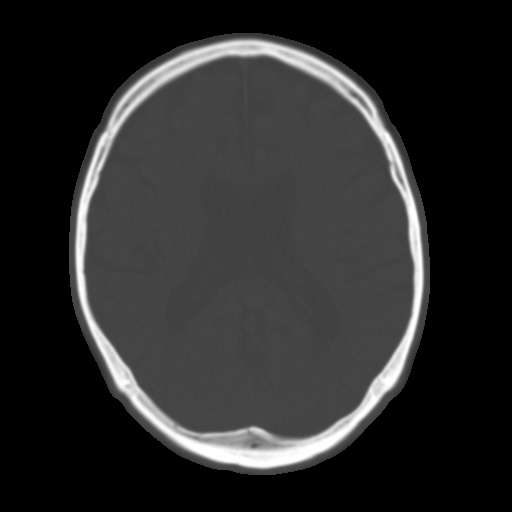
[im 21/33  brain]
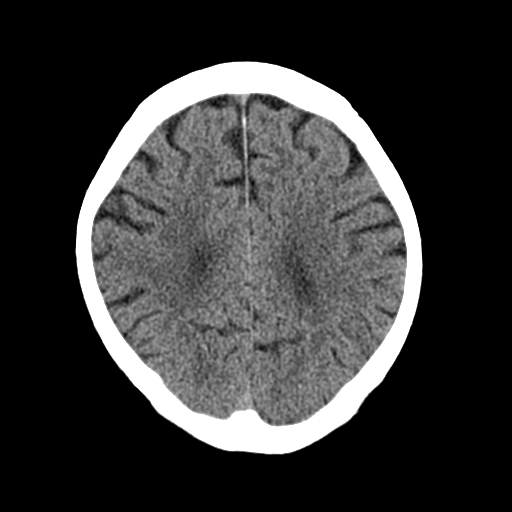
[im 21/33  bone]
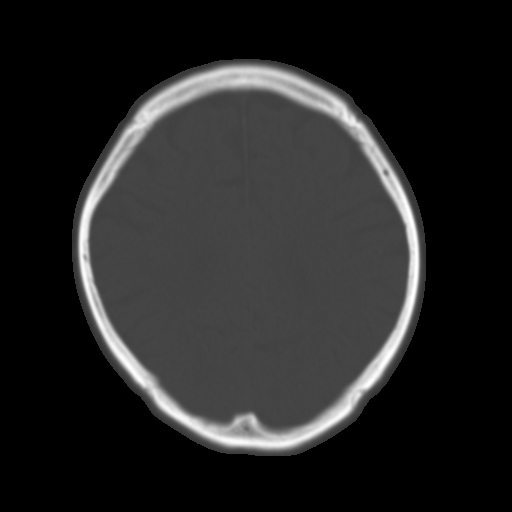
[im 25/33  bone]
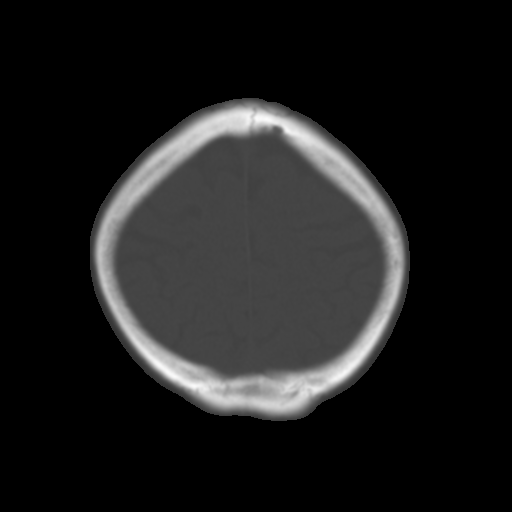
[im 29/33  bone]
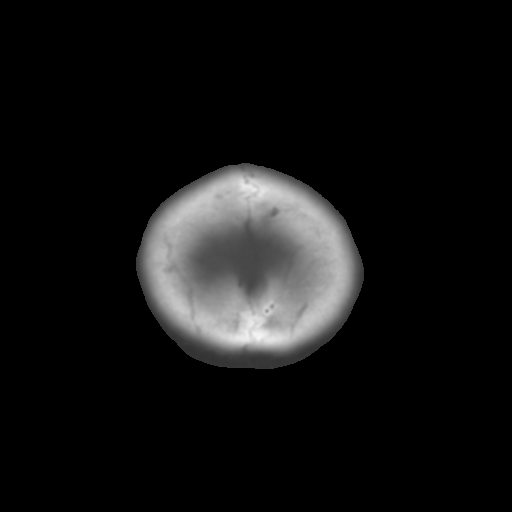

[Series 4: cor head wo · coronal · 0.34mm/px · 3 of 71 slices shown]
[im 18/71  bone]
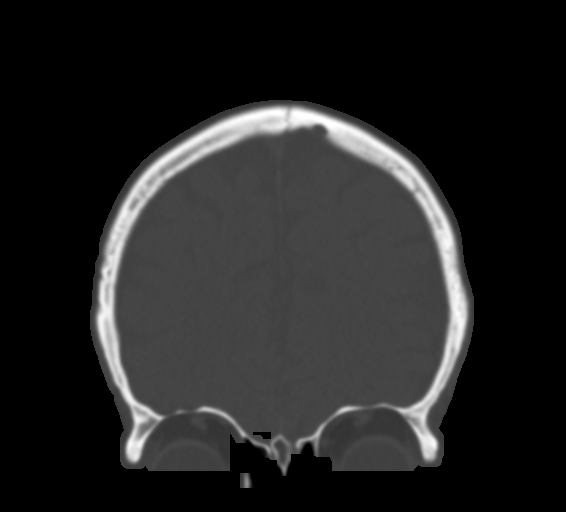
[im 36/71  bone]
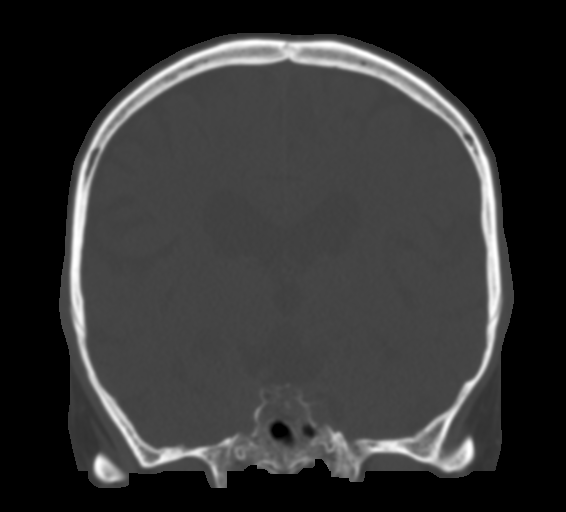
[im 53/71  bone]
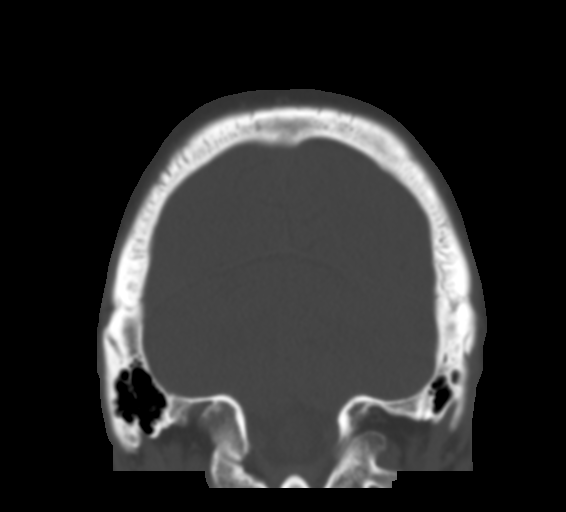

[Series 5: sag head wo · sagittal · 0.34mm/px · 1 of 58 slices shown]
[im 29/58  bone]
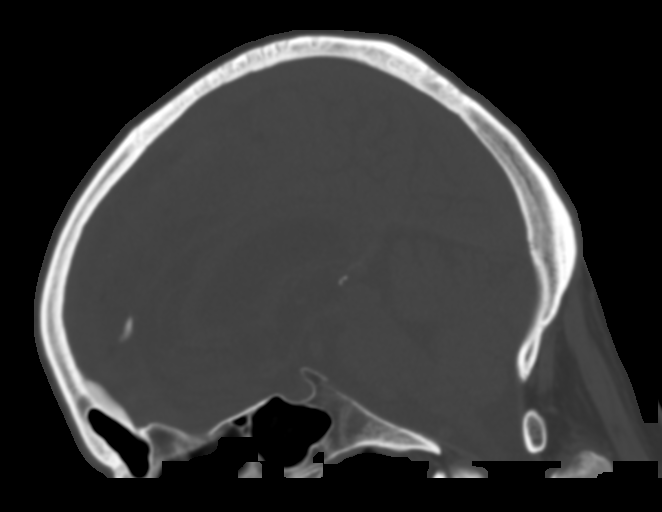

[Series 6: max soft · axial · 0.34mm/px · z∈[-288,-222]mm · 5 of 75 slices shown]
[im 8/75  brain]
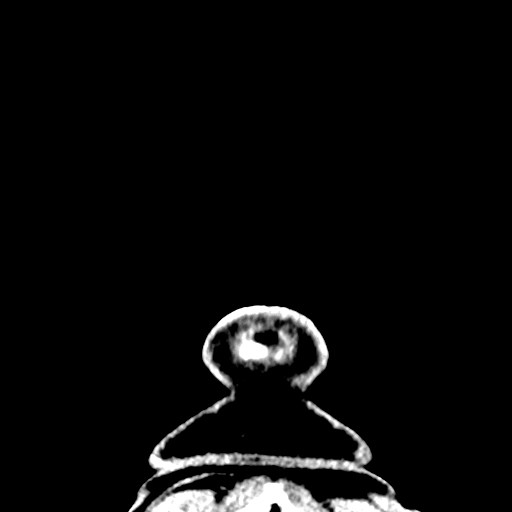
[im 15/75  brain]
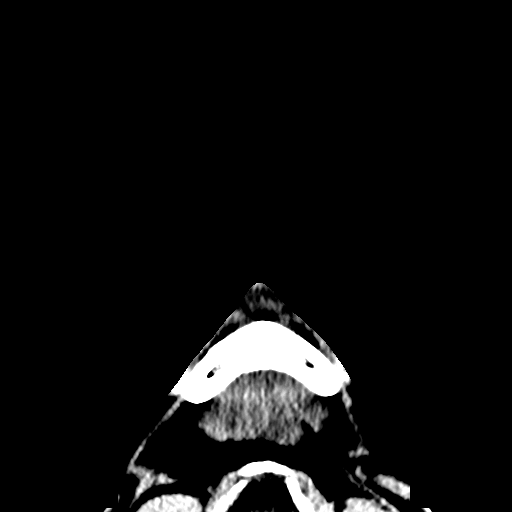
[im 23/75  brain]
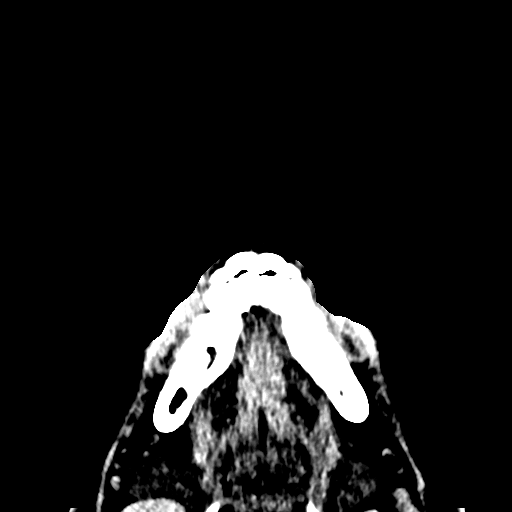
[im 34/75  brain]
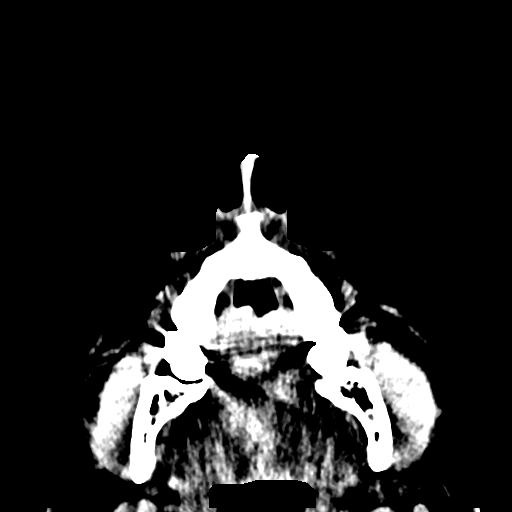
[im 41/75  brain]
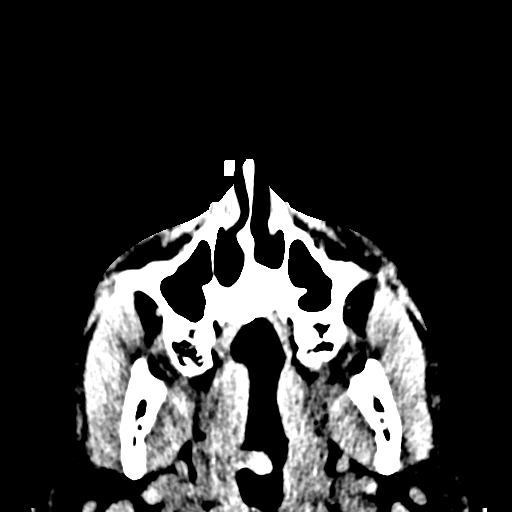

[16 of 47 positions shown; findings below may reference images not displayed]

FINDINGS: CT HEAD FINDINGS

Brain: No evidence of acute infarction, hemorrhage, extra-axial
collection, ventriculomegaly, or mass effect. Generalized cerebral
atrophy. Periventricular white matter low attenuation likely
secondary to microangiopathy.

Vascular: Cerebrovascular atherosclerotic calcifications are noted.

Skull: Negative for fracture or focal lesion.

Sinuses/Orbits: Visualized portions of the orbits are unremarkable.
Visualized portions of the paranasal sinuses and mastoid air cells
are unremarkable.

Other: Small right frontal scalp hematoma.

CT MAXILLOFACIAL FINDINGS

Osseous: No fracture or mandibular dislocation. No destructive
process.

Orbits: Negative. No traumatic or inflammatory finding.

Sinuses: Clear.

Soft tissues: Negative.
IMPRESSION: 1. No acute intracranial pathology.
2.  No acute osseous injury of the maxillofacial bones.

## 2019-08-06 ENCOUNTER — Encounter: Payer: Self-pay | Admitting: Internal Medicine

## 2019-08-06 DIAGNOSIS — L819 Disorder of pigmentation, unspecified: Secondary | ICD-10-CM | POA: Diagnosis not present

## 2019-08-06 DIAGNOSIS — L814 Other melanin hyperpigmentation: Secondary | ICD-10-CM | POA: Diagnosis not present

## 2019-08-06 DIAGNOSIS — Z85828 Personal history of other malignant neoplasm of skin: Secondary | ICD-10-CM | POA: Diagnosis not present

## 2019-08-06 DIAGNOSIS — L821 Other seborrheic keratosis: Secondary | ICD-10-CM | POA: Diagnosis not present

## 2019-08-06 DIAGNOSIS — D229 Melanocytic nevi, unspecified: Secondary | ICD-10-CM | POA: Diagnosis not present

## 2019-08-06 DIAGNOSIS — D485 Neoplasm of uncertain behavior of skin: Secondary | ICD-10-CM | POA: Diagnosis not present

## 2019-08-06 DIAGNOSIS — C44321 Squamous cell carcinoma of skin of nose: Secondary | ICD-10-CM | POA: Diagnosis not present

## 2019-08-21 DIAGNOSIS — D099 Carcinoma in situ, unspecified: Secondary | ICD-10-CM | POA: Insufficient documentation

## 2019-08-25 DIAGNOSIS — D0439 Carcinoma in situ of skin of other parts of face: Secondary | ICD-10-CM | POA: Diagnosis not present

## 2019-08-25 DIAGNOSIS — D485 Neoplasm of uncertain behavior of skin: Secondary | ICD-10-CM | POA: Diagnosis not present

## 2019-09-08 ENCOUNTER — Encounter: Payer: Self-pay | Admitting: Internal Medicine

## 2019-09-09 ENCOUNTER — Other Ambulatory Visit: Payer: Self-pay | Admitting: Internal Medicine

## 2019-09-09 MED ORDER — ZOLPIDEM TARTRATE 10 MG PO TABS: 10.0000 mg | ORAL_TABLET | Freq: Every evening | ORAL | 0 refills | Status: DC | PRN

## 2019-09-09 MED ORDER — CARVEDILOL 6.25 MG PO TABS: 6.2500 mg | ORAL_TABLET | Freq: Two times a day (BID) | ORAL | 1 refills | Status: DC

## 2019-09-09 NOTE — Telephone Encounter (Signed)
Prescription for Ambien sent, let him know.  I see that you already refill carvedilol

## 2019-09-09 NOTE — Telephone Encounter (Signed)
Ambien refill.   Last OV: 05/28/2019 Last Fill: 05/13/2019 #90 and 0RF UDS: 05/28/2019 Low risk  Carvedilol refilled 90 days and 1 refill

## 2019-10-13 DIAGNOSIS — R69 Illness, unspecified: Secondary | ICD-10-CM | POA: Diagnosis not present

## 2019-10-17 ENCOUNTER — Telehealth: Payer: Self-pay | Admitting: Internal Medicine

## 2019-10-17 ENCOUNTER — Encounter: Payer: Self-pay | Admitting: Internal Medicine

## 2019-10-20 NOTE — Telephone Encounter (Signed)
Clonazepam refill.   Last OV: 05/28/2019 Last Fill: 06/30/2019 #60 and 2RF Pt sig: 1 tab bid prn UDS: 05/28/2019 Low risk

## 2019-10-20 NOTE — Telephone Encounter (Signed)
Sent!

## 2019-11-04 ENCOUNTER — Other Ambulatory Visit: Payer: Self-pay

## 2019-11-05 ENCOUNTER — Encounter: Payer: Medicare HMO | Admitting: Internal Medicine

## 2019-11-06 ENCOUNTER — Other Ambulatory Visit: Payer: Self-pay

## 2019-11-07 ENCOUNTER — Ambulatory Visit (INDEPENDENT_AMBULATORY_CARE_PROVIDER_SITE_OTHER): Payer: Medicare HMO | Admitting: Internal Medicine

## 2019-11-07 ENCOUNTER — Encounter: Payer: Self-pay | Admitting: Internal Medicine

## 2019-11-07 VITALS — BP 114/64 | HR 48 | Temp 96.0°F | Resp 16 | Ht 72.0 in | Wt 187.0 lb

## 2019-11-07 DIAGNOSIS — Z0000 Encounter for general adult medical examination without abnormal findings: Secondary | ICD-10-CM | POA: Diagnosis not present

## 2019-11-07 DIAGNOSIS — E559 Vitamin D deficiency, unspecified: Secondary | ICD-10-CM | POA: Diagnosis not present

## 2019-11-07 NOTE — Progress Notes (Signed)
Subjective:    Patient ID: Bobby [name], male    DOB: [birth year], 68 y.o.   MRN: [id number]  DOS:  11/07/2019 Type of visit - description: CPX In general feeling well, has no concerns, anxiety is well controlled   Review of Systems    A 14 point review of systems is negative   Past Medical History:  Diagnosis Date  . Anxiety and depression   . Hypertension   . Hyponatremia 03-2013   ongoing, seeing Dr. Posey Pronto  . Insomnia   . SIADH (syndrome of inappropriate ADH production) ([place]) 2017   Dr. Posey Pronto  . Squamous cell carcinoma in situ     Past Surgical History:  Procedure Laterality Date  . SHOULDER SURGERY  1980s   right shoulder    Social History   Socioeconomic History  . Marital status: Married    Spouse name: Not on file  . Number of children: 2  . Years of education: 16  . Highest education level: Bachelor's degree (e.g., BA, AB, BS)  Occupational History  . Occupation: semi-retired--business owner   Tobacco Use  . Smoking status: Never Smoker  . Smokeless tobacco: Never Used  Substance and Sexual Activity  . Alcohol use: Yes    Alcohol/week: 0.0 standard drinks    Comment: socially , 2 to 3 drinks  a day  . Drug use: No  . Sexual activity: Yes  Other Topics Concern  . Not on file  Social History Narrative   [name], Married,    2 children   mom passed away 06/03/2008   Right-handed.   2-3 cups coffee per day.   Lives at home with wife.            Social Determinants of Health   Financial Resource Strain:   . Difficulty of Paying Living Expenses: Not on file  Food Insecurity:   . Worried About Charity fundraiser in the Last Year: Not on file  . Ran Out of Food in the Last Year: Not on file  Transportation Needs:   . Lack of Transportation (Medical): Not on file  . Lack of Transportation (Non-Medical): Not on file  Physical Activity:   . Days of Exercise per Week: Not on file  . Minutes of Exercise per Session: Not on file  Stress:   .  Feeling of Stress : Not on file  Social Connections:   . Frequency of Communication with Friends and Family: Not on file  . Frequency of Social Gatherings with Friends and Family: Not on file  . Attends Religious Services: Not on file  . Active Member of Clubs or Organizations: Not on file  . Attends Archivist Meetings: Not on file  . Marital Status: Not on file  Intimate Partner Violence:   . Fear of Current or Ex-Partner: Not on file  . Emotionally Abused: Not on file  . Physically Abused: Not on file  . Sexually Abused: Not on file      Family History  Problem Relation Age of Onset  . Hypertension Mother   . Stroke Mother   . Diabetes Mother   . Emphysema Mother        smoked  . Thyroid disease Mother   . Alcoholism Father   . Colon cancer Neg Hx   . Prostate cancer Neg Hx   . CAD Neg Hx      Allergies as of 11/07/2019      Reactions   Tramadol  REACTION: hives      Medication List       Accurate as of November 07, 2019 11:59 PM. If you have any questions, ask your nurse or doctor.        STOP taking these medications   NONFORMULARY OR COMPOUNDED ITEM Stopped by: Kathlene November, MD     TAKE these medications   carvedilol 6.25 MG tablet Commonly known as: COREG Take 1 tablet (6.25 mg total) by mouth 2 (two) times daily with a meal.   clonazePAM 0.5 MG tablet Commonly known as: KLONOPIN TAKE ONE TABLET BY MOUTH TWICE A DAY AS NEEDED FOR ANXIETY   folic acid 1 MG tablet Commonly known as: FOLVITE Take 1 tablet (1 mg total) by mouth daily.   levocetirizine 5 MG tablet Commonly known as: XYZAL Take 1 tablet (5 mg total) by mouth daily as needed for allergies.   multivitamin with minerals Tabs tablet Take 1 tablet by mouth daily.   olopatadine 0.1 % ophthalmic solution Commonly known as: PATANOL Place 1 drop into both eyes 2 (two) times daily.   sildenafil 20 MG tablet Commonly known as: REVATIO Take 3-4 tablets (60-80 mg total) by mouth  at bedtime as needed.   thiamine 100 MG tablet Take 1 tablet (100 mg total) by mouth daily.   zolpidem 10 MG tablet Commonly known as: AMBIEN Take 1 tablet (10 mg total) by mouth at bedtime as needed for sleep.           Objective:   Physical Exam BP 114/64 (BP Location: Right Arm, Patient Position: Sitting, Cuff Size: Normal)   Pulse (!) 48   Temp (!) 96 F (35.6 C) (Temporal)   Resp 16   Ht 6' (1.829 m)   Wt 187 lb (84.8 kg)   SpO2 100%   BMI 25.36 kg/m  General: Well developed, NAD, BMI noted Neck: No  thyromegaly  HEENT:  Normocephalic . Face symmetric, atraumatic Lungs:  CTA B Normal respiratory effort, no intercostal retractions, no accessory muscle use. Heart: RRR,  no murmur.  No pretibial edema bilaterally  Abdomen:  Not distended, soft, non-tender. No rebound or rigidity.   Skin: Exposed areas without rash. Not pale. Not jaundice Neurologic:  alert & oriented X3.  Speech normal, gait appropriate for age and unassisted Strength symmetric and appropriate for age.  Psych: Cognition and judgment appear intact.  Cooperative with normal attention span and concentration.  Behavior appropriate. No anxious or depressed appearing.     Assessment    Assessment  HTN Anxiety, depression, insomnia : used lexapro, d/c (decreased libido) Daily etoh use  Hypogonadotrophic Hypogonadism: per  Dr Dwyane Dee , on HRT Memory problems? [facility] per neuro visit 12/2017 Hyponatremia onset 2014, admitted. Dx SIADH (renal 12/2015:RX to watch fluid intake and sx -gait d/o, MS changes) H/o vit D def SCC: DX 07-2019  PLAN: HTN: BP today is very good, when he arrived to the office his heart rate was in the high 40s, according to his Fitbit, his heart rate is in the 50s to 60s.  He feels well.  Continue carvedilol Anxiety, depression, insomnia: Well-controlled with clonazepam, typically half tablet twice a day, the need to take Ambien has decreased significantly, typically takes half  tablet at night Hypogonadism: On no HRT Hyponatremia: Checking labs. History of vitamin D deficiency: Checking labs, on multivitamins. SCC dx 08/06/2019, nose.  Recommend to see dermatology regularly RTC for blood work next week, RTC for visit 6 months     This  visit occurred during the SARS-CoV-2 public health emergency.  Safety protocols were in place, including screening questions prior to the visit, additional usage of staff PPE, and extensive cleaning of exam room while observing appropriate contact time as indicated for disinfecting solutions.

## 2019-11-07 NOTE — Patient Instructions (Addendum)
  GO TO THE FRONT DESK Schedule labs to be done early next week  Schedule your next appointment with me for 6 months

## 2019-11-08 NOTE — Assessment & Plan Note (Signed)
HTN: BP today is very good, when he arrived to the office his heart rate was in the high 40s, according to his Fitbit, his heart rate is in the 50s to 60s.  He feels well.  Continue carvedilol Anxiety, depression, insomnia: Well-controlled with clonazepam, typically half tablet twice a day, the need to take Ambien has decreased significantly, typically takes half tablet at night Hypogonadism: On no HRT Hyponatremia: Checking labs. History of vitamin D deficiency: Checking labs, on multivitamins. SCC dx 08/06/2019, nose.  Recommend to see dermatology regularly RTC for blood work next week, RTC for visit 6 months

## 2019-11-08 NOTE — Assessment & Plan Note (Signed)
-  Tdap @ the pharmacy ~ 09/2019 - zostavax 2014  -  PNM shot --2015;  prevnar--2016  - S/p  shingrix x 2 -  Had a flu shot -CCS:  Cscope ~ 2007, Dr Watt Climes ;  repeated Cscope 08-2009 ---->  Tubular adenoma per bx report------> colonoscopy 09-2015, no polyps, 5 years - prostate ca screening: not on HRT, DRE 2018 normal,  PSA  05/2019:  2.85.  Reassess next year -Diet and exercise: He is doing well. -EtOH: Drinking mostly 3 drinks at night, again encouraged moderation, no more than 2 drinks at night is ok

## 2019-11-25 ENCOUNTER — Other Ambulatory Visit: Payer: Self-pay

## 2019-11-25 ENCOUNTER — Other Ambulatory Visit (INDEPENDENT_AMBULATORY_CARE_PROVIDER_SITE_OTHER): Payer: Medicare HMO

## 2019-11-25 DIAGNOSIS — Z0000 Encounter for general adult medical examination without abnormal findings: Secondary | ICD-10-CM | POA: Diagnosis not present

## 2019-11-25 DIAGNOSIS — E559 Vitamin D deficiency, unspecified: Secondary | ICD-10-CM

## 2019-11-25 LAB — COMPREHENSIVE METABOLIC PANEL
ALT: 21 U/L (ref 0–53)
AST: 24 U/L (ref 0–37)
Albumin: 4.3 g/dL (ref 3.5–5.2)
Alkaline Phosphatase: 68 U/L (ref 39–117)
BUN: 19 mg/dL (ref 6–23)
CO2: 29 mEq/L (ref 19–32)
Calcium: 9 mg/dL (ref 8.4–10.5)
Chloride: 99 mEq/L (ref 96–112)
Creatinine, Ser: 1 mg/dL (ref 0.40–1.50)
GFR: 73.74 mL/min (ref >60.00)
Glucose, Bld: 93 mg/dL (ref 70–99)
Potassium: 4.9 mEq/L (ref 3.5–5.1)
Sodium: 136 mEq/L (ref 135–145)
Total Bilirubin: 0.6 mg/dL (ref 0.2–1.2)
Total Protein: 6.5 g/dL (ref 6.0–8.3)

## 2019-11-25 LAB — LIPID PANEL
Cholesterol: 175 mg/dL (ref 0–200)
HDL: 52.5 mg/dL (ref >39.00)
LDL Cholesterol: 104 mg/dL — ABNORMAL HIGH (ref 0–99)
NonHDL: 122.91
Total CHOL/HDL Ratio: 3
Triglycerides: 96 mg/dL (ref 0.0–149.0)
VLDL: 19.2 mg/dL (ref 0.0–40.0)

## 2019-11-25 LAB — TSH: TSH: 2.07 u[IU]/mL (ref 0.35–4.50)

## 2019-11-25 LAB — VITAMIN D 25 HYDROXY (VIT D DEFICIENCY, FRACTURES): VITD: 22.62 ng/mL — ABNORMAL LOW (ref 30.00–100.00)

## 2019-11-26 ENCOUNTER — Other Ambulatory Visit: Payer: Self-pay | Admitting: Internal Medicine

## 2019-11-26 DIAGNOSIS — R69 Illness, unspecified: Secondary | ICD-10-CM | POA: Diagnosis not present

## 2019-11-26 MED ORDER — ERGOCALCIFEROL 1.25 MG (50000 UT) PO CAPS: 50000.0000 [IU] | ORAL_CAPSULE | ORAL | 0 refills | Status: DC

## 2019-12-05 ENCOUNTER — Encounter: Payer: Self-pay | Admitting: Internal Medicine

## 2019-12-13 ENCOUNTER — Ambulatory Visit: Payer: Medicare HMO | Attending: Internal Medicine

## 2019-12-13 DIAGNOSIS — Z23 Encounter for immunization: Secondary | ICD-10-CM | POA: Insufficient documentation

## 2019-12-13 NOTE — Progress Notes (Signed)
   Covid-19 Vaccination Clinic  Name:  Bobby [name]    MRN: [id number] DOB: 25-Aug-1951  12/13/2019  Mr. Broad was observed post Covid-19 immunization for 30 minutes based on pre-vaccination screening without incidence. He was provided with Vaccine Information Sheet and instruction to access the V-Safe system.   [name] was instructed to call 911 with any severe reactions post vaccine: Marland Kitchen Difficulty breathing  . Swelling of your face and throat  . A fast heartbeat  . A bad rash all over your body  . Dizziness and weakness    Immunizations Administered    Name Date Dose VIS Date Route   Pfizer COVID-19 Vaccine 12/13/2019 10:59 AM 0.3 mL 10/31/2019 Intramuscular   Manufacturer: [place]   Lot: [id number]   [place]: [id number]

## 2019-12-26 ENCOUNTER — Telehealth: Payer: Self-pay | Admitting: Internal Medicine

## 2019-12-26 NOTE — Telephone Encounter (Signed)
PDMP okay, prescription sent 

## 2019-12-26 NOTE — Telephone Encounter (Signed)
Ambien refill.   Last OV: 11/07/2019 Last Fill: 09/09/2019 #90 and 0RF Pt sig: 1 tab qhs prn UDS: 05/28/2019 Low risk

## 2020-01-03 ENCOUNTER — Ambulatory Visit: Payer: Medicare HMO | Attending: Internal Medicine

## 2020-01-03 DIAGNOSIS — Z23 Encounter for immunization: Secondary | ICD-10-CM | POA: Insufficient documentation

## 2020-01-03 NOTE — Progress Notes (Signed)
   Covid-19 Vaccination Clinic  Name:  Bobby [name]    MRN: [id number] DOB: May 11, 1951  01/03/2020  Bobby [name] was observed post Covid-19 immunization for 15 minutes without incidence. He was provided with Vaccine Information Sheet and instruction to access the V-Safe system.   Bobby [name] was instructed to call 911 with any severe reactions post vaccine: Marland Kitchen Difficulty breathing  . Swelling of your face and throat  . A fast heartbeat  . A bad rash all over your body  . Dizziness and weakness    Immunizations Administered    Name Date Dose VIS Date Route   Pfizer COVID-19 Vaccine 01/03/2020  1:19 PM 0.3 mL 10/31/2019 Intramuscular   Manufacturer: [place]   Lot: [id number]   [place]: [id number]

## 2020-01-04 ENCOUNTER — Encounter: Payer: Self-pay | Admitting: Internal Medicine

## 2020-02-10 DIAGNOSIS — E871 Hypo-osmolality and hyponatremia: Secondary | ICD-10-CM | POA: Diagnosis not present

## 2020-02-10 DIAGNOSIS — E559 Vitamin D deficiency, unspecified: Secondary | ICD-10-CM | POA: Diagnosis not present

## 2020-02-10 DIAGNOSIS — I10 Essential (primary) hypertension: Secondary | ICD-10-CM | POA: Diagnosis not present

## 2020-02-11 DIAGNOSIS — I10 Essential (primary) hypertension: Secondary | ICD-10-CM | POA: Diagnosis not present

## 2020-02-11 DIAGNOSIS — E559 Vitamin D deficiency, unspecified: Secondary | ICD-10-CM | POA: Diagnosis not present

## 2020-02-11 DIAGNOSIS — E871 Hypo-osmolality and hyponatremia: Secondary | ICD-10-CM | POA: Diagnosis not present

## 2020-02-18 DIAGNOSIS — Z85828 Personal history of other malignant neoplasm of skin: Secondary | ICD-10-CM | POA: Diagnosis not present

## 2020-02-18 DIAGNOSIS — D0359 Melanoma in situ of other part of trunk: Secondary | ICD-10-CM | POA: Diagnosis not present

## 2020-02-18 DIAGNOSIS — L578 Other skin changes due to chronic exposure to nonionizing radiation: Secondary | ICD-10-CM | POA: Diagnosis not present

## 2020-02-18 DIAGNOSIS — L570 Actinic keratosis: Secondary | ICD-10-CM | POA: Diagnosis not present

## 2020-02-18 DIAGNOSIS — D225 Melanocytic nevi of trunk: Secondary | ICD-10-CM | POA: Diagnosis not present

## 2020-02-18 DIAGNOSIS — L905 Scar conditions and fibrosis of skin: Secondary | ICD-10-CM | POA: Diagnosis not present

## 2020-02-18 DIAGNOSIS — D1801 Hemangioma of skin and subcutaneous tissue: Secondary | ICD-10-CM | POA: Diagnosis not present

## 2020-02-18 DIAGNOSIS — L821 Other seborrheic keratosis: Secondary | ICD-10-CM | POA: Diagnosis not present

## 2020-02-18 DIAGNOSIS — R238 Other skin changes: Secondary | ICD-10-CM | POA: Diagnosis not present

## 2020-02-18 DIAGNOSIS — D485 Neoplasm of uncertain behavior of skin: Secondary | ICD-10-CM | POA: Diagnosis not present

## 2020-02-29 ENCOUNTER — Telehealth: Payer: Self-pay | Admitting: Internal Medicine

## 2020-03-01 NOTE — Telephone Encounter (Signed)
PDMP okay, RF sent 

## 2020-03-01 NOTE — Telephone Encounter (Signed)
Clonazepam refill.   Last OV: 11/07/2019 Last Fill: 10/20/2019 #60 and 3RF Pt sig: 1 tab bid prn UDS: 05/28/2019 Low risk

## 2020-03-05 DIAGNOSIS — L989 Disorder of the skin and subcutaneous tissue, unspecified: Secondary | ICD-10-CM | POA: Diagnosis not present

## 2020-03-05 DIAGNOSIS — C4359 Malignant melanoma of other part of trunk: Secondary | ICD-10-CM | POA: Diagnosis not present

## 2020-03-16 DIAGNOSIS — D0359 Melanoma in situ of other part of trunk: Secondary | ICD-10-CM | POA: Insufficient documentation

## 2020-03-17 DIAGNOSIS — F419 Anxiety disorder, unspecified: Secondary | ICD-10-CM | POA: Diagnosis not present

## 2020-03-17 DIAGNOSIS — R69 Illness, unspecified: Secondary | ICD-10-CM | POA: Diagnosis not present

## 2020-03-17 DIAGNOSIS — I10 Essential (primary) hypertension: Secondary | ICD-10-CM | POA: Diagnosis not present

## 2020-03-17 DIAGNOSIS — Z885 Allergy status to narcotic agent status: Secondary | ICD-10-CM | POA: Diagnosis not present

## 2020-03-17 DIAGNOSIS — G4700 Insomnia, unspecified: Secondary | ICD-10-CM | POA: Diagnosis not present

## 2020-03-17 DIAGNOSIS — Z7722 Contact with and (suspected) exposure to environmental tobacco smoke (acute) (chronic): Secondary | ICD-10-CM | POA: Diagnosis not present

## 2020-03-17 DIAGNOSIS — Z825 Family history of asthma and other chronic lower respiratory diseases: Secondary | ICD-10-CM | POA: Diagnosis not present

## 2020-03-17 DIAGNOSIS — Z85828 Personal history of other malignant neoplasm of skin: Secondary | ICD-10-CM | POA: Diagnosis not present

## 2020-03-29 ENCOUNTER — Telehealth: Payer: Self-pay | Admitting: Internal Medicine

## 2020-03-29 NOTE — Telephone Encounter (Signed)
Ambien refill.   Last OV: 11/07/2019  Last Fill: 12/26/19 #90 and 0RF Pt sig: 1 tab qhs prn UDS: 05/28/2019 Low risk

## 2020-03-29 NOTE — Telephone Encounter (Signed)
PDMP okay, prescription sent 

## 2020-04-02 ENCOUNTER — Encounter: Payer: Self-pay | Admitting: Internal Medicine

## 2020-04-02 ENCOUNTER — Other Ambulatory Visit: Payer: Self-pay | Admitting: Internal Medicine

## 2020-04-02 MED ORDER — TADALAFIL 10 MG PO TABS: 10.0000 mg | ORAL_TABLET | ORAL | 0 refills | Status: DC | PRN

## 2020-04-03 ENCOUNTER — Encounter: Payer: Self-pay | Admitting: Internal Medicine

## 2020-05-12 ENCOUNTER — Ambulatory Visit (INDEPENDENT_AMBULATORY_CARE_PROVIDER_SITE_OTHER): Payer: Medicare HMO | Admitting: Internal Medicine

## 2020-05-12 ENCOUNTER — Other Ambulatory Visit: Payer: Self-pay

## 2020-05-12 ENCOUNTER — Encounter: Payer: Self-pay | Admitting: Internal Medicine

## 2020-05-12 VITALS — BP 127/82 | HR 64 | Temp 96.4°F | Resp 18 | Ht 72.0 in | Wt 183.5 lb

## 2020-05-12 DIAGNOSIS — Z79899 Other long term (current) drug therapy: Secondary | ICD-10-CM | POA: Diagnosis not present

## 2020-05-12 DIAGNOSIS — G4700 Insomnia, unspecified: Secondary | ICD-10-CM

## 2020-05-12 DIAGNOSIS — E871 Hypo-osmolality and hyponatremia: Secondary | ICD-10-CM

## 2020-05-12 DIAGNOSIS — F411 Generalized anxiety disorder: Secondary | ICD-10-CM | POA: Diagnosis not present

## 2020-05-12 DIAGNOSIS — R69 Illness, unspecified: Secondary | ICD-10-CM | POA: Diagnosis not present

## 2020-05-12 LAB — BASIC METABOLIC PANEL
BUN: 15 mg/dL (ref 6–23)
CO2: 29 mEq/L (ref 19–32)
Calcium: 9.1 mg/dL (ref 8.4–10.5)
Chloride: 95 mEq/L — ABNORMAL LOW (ref 96–112)
Creatinine, Ser: 0.95 mg/dL (ref 0.40–1.50)
GFR: 78.13 mL/min (ref >60.00)
Glucose, Bld: 94 mg/dL (ref 70–99)
Potassium: 4.8 mEq/L (ref 3.5–5.1)
Sodium: 126 mEq/L — ABNORMAL LOW (ref 135–145)

## 2020-05-12 NOTE — Progress Notes (Signed)
Pre visit review using our clinic review tool, if applicable. No additional management support is needed unless otherwise documented below in the visit note. 

## 2020-05-12 NOTE — Patient Instructions (Addendum)
Please schedule Medicare Wellness with Glenard Haring.   Take over-the-counter vitamin D 1000 units every day  GO TO THE LAB : Get the blood work     [place], [place] Come back for   a physical exam by December 2021

## 2020-05-12 NOTE — Progress Notes (Signed)
Subjective:    Patient ID: Bobby [name], male    DOB: 14-Apr-1951, 69 y.o.   MRN: [id number]  DOS:  05/12/2020 Type of visit - description: Follow-up Today with talk about hypertension, hyponatremia, low vitamin D.  In general feels well. Stress level much decreased. Recently diagnosed with melanoma status post excision. 3 weeks ago had a rash at the chest anteriorly, that is largely resolved. He remains active.  No concerns today   Review of Systems See above   Past Medical History:  Diagnosis Date  . Anxiety and depression   . Hypertension   . Hyponatremia 03-2013   ongoing, seeing Dr. Posey Pronto  . Insomnia   . Melanoma in situ of back ([place])    biopsy on 03/05/2020  . SIADH (syndrome of inappropriate ADH production) ([place]) 2017   Dr. Posey Pronto  . Squamous cell carcinoma in situ     Past Surgical History:  Procedure Laterality Date  . SHOULDER SURGERY  1980s   right shoulder    Allergies as of 05/12/2020      Reactions   Tramadol    REACTION: hives      Medication List       Accurate as of May 12, 2020 11:59 PM. If you have any questions, ask your nurse or doctor.        STOP taking these medications   ergocalciferol 1.25 MG (50000 UT) capsule Commonly known as: VITAMIN D2 Stopped by: Kathlene November, MD     TAKE these medications   carvedilol 6.25 MG tablet Commonly known as: COREG Take 1 tablet (6.25 mg total) by mouth 2 (two) times daily with a meal.   cholecalciferol 25 MCG (1000 UNIT) tablet Commonly known as: VITAMIN D3 Take 1,000 Units by mouth daily.   clonazePAM 0.5 MG tablet Commonly known as: KLONOPIN TAKE ONE TABLET BY MOUTH TWICE A DAY AS NEEDED FOR ANXIETY   folic acid 1 MG tablet Commonly known as: FOLVITE Take 1 tablet (1 mg total) by mouth daily.   levocetirizine 5 MG tablet Commonly known as: XYZAL Take 1 tablet (5 mg total) by mouth daily as needed for allergies.   multivitamin with minerals Tabs tablet Take 1 tablet by mouth  daily.   olopatadine 0.1 % ophthalmic solution Commonly known as: PATANOL Place 1 drop into both eyes 2 (two) times daily.   tadalafil 10 MG tablet Commonly known as: CIALIS Take 1-2 tablets (10-20 mg total) by mouth every other day as needed for erectile dysfunction.   thiamine 100 MG tablet Take 1 tablet (100 mg total) by mouth daily.   zolpidem 10 MG tablet Commonly known as: AMBIEN TAKE ONE TABLET BY MOUTH EVERY NIGHT AT BEDTIME AS NEEDED FOR SLEEP          Objective:   Physical Exam BP 127/82 (BP Location: Left Arm, Patient Position: Sitting, Cuff Size: Small)   Pulse 64   Temp (!) 96.4 F (35.8 C) (Temporal)   Resp 18   Ht 6' (1.829 m)   Wt 183 lb 8 oz (83.2 kg)   SpO2 97%   BMI 24.89 kg/m  General:   Well developed, NAD, BMI noted. HEENT:  Normocephalic . Face symmetric, atraumatic Lungs:  CTA B Normal respiratory effort, no intercostal retractions, no accessory muscle use. Heart: RRR,  no murmur.  Lower extremities: no pretibial edema bilaterally  Skin: Multiple skin lesions throughout, anterior chest with no actual rash Neurologic:  alert & oriented X3.  Speech normal, gait  appropriate for age and unassisted Psych--  Cognition and judgment appear intact.  Cooperative with normal attention span and concentration.  Behavior appropriate. No anxious or depressed appearing.      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 MCI per neuro visit 12/2017 Hyponatremia onset 2014, admitted. 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 Hyponatremia: Seen by nephrology 02/11/20. He was felt to be euvolemic, noted to be trying to limit his alcohol intake.  Next visit in  1 year. Check a BMP  ED: Change from sildenafil to Cialis on 03/2020, it works better for him. Anxiety, depression, insomnia: On clonazepam, not taking Ambien every night.  Stress level is definitely decreased, feeling well. PHQ-9: zero Melanoma: dx 02/2020.  Follow-up by dermatology closely. Vitamin D deficiency: Had ergocalciferol, currently not taking OTCs other than a multivitamin.  Recommend vitamin D at 1000 units daily, do not get sun exposure due to history of skin cancer. EtOH: Reports intake has decreased significantly.  Not drinking every day. Had Covid vaccination RTC 6 months CPX     This visit occurred during the SARS-CoV-2 public health emergency.  Safety protocols were in place, including screening questions prior to the visit, additional usage of staff PPE, and extensive cleaning of exam room while observing appropriate contact time as indicated for disinfecting solutions.

## 2020-05-13 NOTE — Addendum Note (Signed)
Addended byDamita Dunnings D on: 05/13/2020 02:16 PM   Modules accepted: Orders

## 2020-05-13 NOTE — Assessment & Plan Note (Signed)
HTN: On carvedilol, BP today is very good. Hyponatremia: Seen by nephrology 02/11/20. He was felt to be euvolemic, noted to be trying to limit his alcohol intake.  Next visit in 1 year. Check a BMP  ED: Change from sildenafil to Cialis on 03/2020, it works better for him. Anxiety, depression, insomnia: On clonazepam, not taking Ambien every night.  Stress level is definitely decreased, feeling well. PHQ-9: zero Melanoma: dx 02/2020.  Follow-up by dermatology closely. Vitamin D deficiency: Had ergocalciferol, currently not taking OTCs other than a multivitamin.  Recommend vitamin D at 1000 units daily, do not get sun exposure due to history of skin cancer. EtOH: Reports intake has decreased significantly.  Not drinking every day. Had Covid vaccination RTC 6 months CPX

## 2020-05-15 LAB — DRUG MONITORING, PANEL 8 WITH CONFIRMATION, URINE
6 Acetylmorphine: NEGATIVE ng/mL (ref <10)
Alcohol Metabolites: POSITIVE ng/mL — AB
Amphetamines: NEGATIVE ng/mL (ref <500)
Benzodiazepines: NEGATIVE ng/mL (ref <100)
Buprenorphine, Urine: NEGATIVE ng/mL (ref <5)
Cocaine Metabolite: NEGATIVE ng/mL (ref <150)
Creatinine: 54.3 mg/dL
Ethyl Glucuronide (ETG): 15704 ng/mL — ABNORMAL HIGH (ref <500)
Ethyl Sulfate (ETS): 3139 ng/mL — ABNORMAL HIGH (ref <100)
MDMA: NEGATIVE ng/mL (ref <500)
Marijuana Metabolite: NEGATIVE ng/mL (ref <20)
Opiates: NEGATIVE ng/mL (ref <100)
Oxidant: NEGATIVE ug/mL
Oxycodone: NEGATIVE ng/mL (ref <100)
pH: 7.4 (ref 4.5–9.0)

## 2020-05-15 LAB — DM TEMPLATE

## 2020-06-02 DIAGNOSIS — R69 Illness, unspecified: Secondary | ICD-10-CM | POA: Diagnosis not present

## 2020-06-03 ENCOUNTER — Other Ambulatory Visit: Payer: Self-pay

## 2020-06-03 ENCOUNTER — Other Ambulatory Visit (INDEPENDENT_AMBULATORY_CARE_PROVIDER_SITE_OTHER): Payer: Medicare HMO

## 2020-06-03 DIAGNOSIS — E871 Hypo-osmolality and hyponatremia: Secondary | ICD-10-CM | POA: Diagnosis not present

## 2020-06-03 LAB — BASIC METABOLIC PANEL
BUN: 20 mg/dL (ref 6–23)
CO2: 29 mEq/L (ref 19–32)
Calcium: 9.5 mg/dL (ref 8.4–10.5)
Chloride: 96 mEq/L (ref 96–112)
Creatinine, Ser: 0.97 mg/dL (ref 0.40–1.50)
GFR: 76.26 mL/min (ref >60.00)
Glucose, Bld: 100 mg/dL — ABNORMAL HIGH (ref 70–99)
Potassium: 4.8 mEq/L (ref 3.5–5.1)
Sodium: 133 mEq/L — ABNORMAL LOW (ref 135–145)

## 2020-06-11 ENCOUNTER — Other Ambulatory Visit: Payer: Self-pay | Admitting: Internal Medicine

## 2020-06-16 DIAGNOSIS — D225 Melanocytic nevi of trunk: Secondary | ICD-10-CM | POA: Diagnosis not present

## 2020-06-16 DIAGNOSIS — Z85828 Personal history of other malignant neoplasm of skin: Secondary | ICD-10-CM | POA: Diagnosis not present

## 2020-06-16 DIAGNOSIS — L989 Disorder of the skin and subcutaneous tissue, unspecified: Secondary | ICD-10-CM | POA: Diagnosis not present

## 2020-06-16 DIAGNOSIS — Z85820 Personal history of malignant melanoma of skin: Secondary | ICD-10-CM | POA: Diagnosis not present

## 2020-06-16 DIAGNOSIS — L570 Actinic keratosis: Secondary | ICD-10-CM | POA: Diagnosis not present

## 2020-06-16 DIAGNOSIS — D485 Neoplasm of uncertain behavior of skin: Secondary | ICD-10-CM | POA: Diagnosis not present

## 2020-06-16 DIAGNOSIS — R238 Other skin changes: Secondary | ICD-10-CM | POA: Diagnosis not present

## 2020-06-16 DIAGNOSIS — L905 Scar conditions and fibrosis of skin: Secondary | ICD-10-CM | POA: Diagnosis not present

## 2020-06-16 DIAGNOSIS — L821 Other seborrheic keratosis: Secondary | ICD-10-CM | POA: Diagnosis not present

## 2020-07-14 ENCOUNTER — Other Ambulatory Visit: Payer: Self-pay | Admitting: Internal Medicine

## 2020-07-14 DIAGNOSIS — D0472 Carcinoma in situ of skin of left lower limb, including hip: Secondary | ICD-10-CM | POA: Diagnosis not present

## 2020-07-14 DIAGNOSIS — L244 Irritant contact dermatitis due to drugs in contact with skin: Secondary | ICD-10-CM | POA: Diagnosis not present

## 2020-07-15 NOTE — Telephone Encounter (Signed)
Sent!

## 2020-07-15 NOTE — Telephone Encounter (Signed)
Last written: 03/29/20 Last ov: 05/12/20 Next ov: 11/03/20 Contract:  UDS: 05/12/20

## 2020-07-25 ENCOUNTER — Telehealth: Payer: Self-pay | Admitting: Internal Medicine

## 2020-07-27 NOTE — Telephone Encounter (Signed)
Prescription sent.  PDMP okay.  Consider UDS in few weeks.

## 2020-07-27 NOTE — Telephone Encounter (Signed)
Clonazepam refill.   Last OV: 05/12/2020 Last Fill: 03/01/2020 #60 and 2RF Pt sig: 1 tab bid prn UDS: 05/12/2020 Moderate risk

## 2020-07-28 DIAGNOSIS — R69 Illness, unspecified: Secondary | ICD-10-CM | POA: Diagnosis not present

## 2020-08-10 ENCOUNTER — Other Ambulatory Visit: Payer: Self-pay | Admitting: Internal Medicine

## 2020-09-06 ENCOUNTER — Other Ambulatory Visit: Payer: Self-pay | Admitting: Internal Medicine

## 2020-09-06 ENCOUNTER — Telehealth: Payer: Self-pay | Admitting: Internal Medicine

## 2020-09-06 DIAGNOSIS — R69 Illness, unspecified: Secondary | ICD-10-CM | POA: Diagnosis not present

## 2020-09-06 NOTE — Telephone Encounter (Signed)
PDMP okay, prescription sent 

## 2020-09-06 NOTE — Telephone Encounter (Signed)
Requesting: clonazepam 0.5mg  Contract: 03/08/2017 UDS: 05/12/2020 Moderate risk Last Visit: 05/12/2020 Next Visit: 11/03/2020 Last Refill: 07/27/2020 #60 and 0RF Pt sig: 1 tab bid prn  Please Advise

## 2020-09-08 DIAGNOSIS — H524 Presbyopia: Secondary | ICD-10-CM | POA: Diagnosis not present

## 2020-10-12 ENCOUNTER — Telehealth: Payer: Self-pay | Admitting: Internal Medicine

## 2020-10-12 NOTE — Telephone Encounter (Signed)
Requesting: Ambien 10mg  Contract: 09/11/2018 UDS: 05/12/2020  Last Visit: 05/12/20 Next Visit: 11/03/20 Last Refill: 07/15/2020 #90 and 0RF Pt sig: 1 tab qhs prn  Please Advise

## 2020-10-12 NOTE — Telephone Encounter (Signed)
PDMP okay, Rx sent 

## 2020-10-20 DIAGNOSIS — L718 Other rosacea: Secondary | ICD-10-CM | POA: Diagnosis not present

## 2020-10-20 DIAGNOSIS — Z85828 Personal history of other malignant neoplasm of skin: Secondary | ICD-10-CM | POA: Diagnosis not present

## 2020-10-20 DIAGNOSIS — D225 Melanocytic nevi of trunk: Secondary | ICD-10-CM | POA: Diagnosis not present

## 2020-10-20 DIAGNOSIS — B351 Tinea unguium: Secondary | ICD-10-CM | POA: Diagnosis not present

## 2020-10-20 DIAGNOSIS — D485 Neoplasm of uncertain behavior of skin: Secondary | ICD-10-CM | POA: Diagnosis not present

## 2020-10-20 DIAGNOSIS — L905 Scar conditions and fibrosis of skin: Secondary | ICD-10-CM | POA: Diagnosis not present

## 2020-10-20 DIAGNOSIS — C44321 Squamous cell carcinoma of skin of nose: Secondary | ICD-10-CM | POA: Diagnosis not present

## 2020-10-20 DIAGNOSIS — L853 Xerosis cutis: Secondary | ICD-10-CM | POA: Diagnosis not present

## 2020-10-20 DIAGNOSIS — Z85820 Personal history of malignant melanoma of skin: Secondary | ICD-10-CM | POA: Diagnosis not present

## 2020-11-01 DIAGNOSIS — D0439 Carcinoma in situ of skin of other parts of face: Secondary | ICD-10-CM | POA: Diagnosis not present

## 2020-11-03 ENCOUNTER — Other Ambulatory Visit: Payer: Self-pay

## 2020-11-03 ENCOUNTER — Encounter: Payer: Self-pay | Admitting: Internal Medicine

## 2020-11-03 ENCOUNTER — Ambulatory Visit (INDEPENDENT_AMBULATORY_CARE_PROVIDER_SITE_OTHER): Payer: Medicare HMO | Admitting: Internal Medicine

## 2020-11-03 VITALS — BP 126/81 | HR 66 | Temp 97.9°F | Ht 72.0 in | Wt 184.0 lb

## 2020-11-03 DIAGNOSIS — Z79899 Other long term (current) drug therapy: Secondary | ICD-10-CM

## 2020-11-03 DIAGNOSIS — E559 Vitamin D deficiency, unspecified: Secondary | ICD-10-CM | POA: Diagnosis not present

## 2020-11-03 DIAGNOSIS — Z0000 Encounter for general adult medical examination without abnormal findings: Secondary | ICD-10-CM

## 2020-11-03 DIAGNOSIS — G4700 Insomnia, unspecified: Secondary | ICD-10-CM

## 2020-11-03 DIAGNOSIS — F411 Generalized anxiety disorder: Secondary | ICD-10-CM | POA: Diagnosis not present

## 2020-11-03 DIAGNOSIS — R69 Illness, unspecified: Secondary | ICD-10-CM | POA: Diagnosis not present

## 2020-11-03 DIAGNOSIS — Z1211 Encounter for screening for malignant neoplasm of colon: Secondary | ICD-10-CM

## 2020-11-03 LAB — CBC WITH DIFFERENTIAL/PLATELET
Basophils Absolute: 0 10*3/uL (ref 0.0–0.1)
Basophils Relative: 0.4 % (ref 0.0–3.0)
Eosinophils Absolute: 0.4 10*3/uL (ref 0.0–0.7)
Eosinophils Relative: 3.5 % (ref 0.0–5.0)
HCT: 38.7 % — ABNORMAL LOW (ref 39.0–52.0)
Hemoglobin: 13 g/dL (ref 13.0–17.0)
Lymphocytes Relative: 61.1 % — ABNORMAL HIGH (ref 12.0–46.0)
Lymphs Abs: 7.2 10*3/uL — ABNORMAL HIGH (ref 0.7–4.0)
MCHC: 33.5 g/dL (ref 30.0–36.0)
MCV: 94.8 fl (ref 78.0–100.0)
Monocytes Absolute: 0.5 10*3/uL (ref 0.1–1.0)
Monocytes Relative: 4.4 % (ref 3.0–12.0)
Neutro Abs: 3.6 10*3/uL (ref 1.4–7.7)
Neutrophils Relative %: 30.6 % — ABNORMAL LOW (ref 43.0–77.0)
Platelets: 222 10*3/uL (ref 150.0–400.0)
RBC: 4.09 Mil/uL — ABNORMAL LOW (ref 4.22–5.81)
RDW: 13.5 % (ref 11.5–15.5)
WBC: 11.8 10*3/uL — ABNORMAL HIGH (ref 4.0–10.5)

## 2020-11-03 LAB — COMPREHENSIVE METABOLIC PANEL
ALT: 19 U/L (ref 0–53)
AST: 24 U/L (ref 0–37)
Albumin: 4.3 g/dL (ref 3.5–5.2)
Alkaline Phosphatase: 65 U/L (ref 39–117)
BUN: 17 mg/dL (ref 6–23)
CO2: 30 mEq/L (ref 19–32)
Calcium: 9.2 mg/dL (ref 8.4–10.5)
Chloride: 98 mEq/L (ref 96–112)
Creatinine, Ser: 0.95 mg/dL (ref 0.40–1.50)
GFR: 80.5 mL/min (ref >60.00)
Glucose, Bld: 94 mg/dL (ref 70–99)
Potassium: 4.9 mEq/L (ref 3.5–5.1)
Sodium: 134 mEq/L — ABNORMAL LOW (ref 135–145)
Total Bilirubin: 0.5 mg/dL (ref 0.2–1.2)
Total Protein: 6.7 g/dL (ref 6.0–8.3)

## 2020-11-03 LAB — LIPID PANEL
Cholesterol: 168 mg/dL (ref 0–200)
HDL: 62.1 mg/dL (ref >39.00)
LDL Cholesterol: 89 mg/dL (ref 0–99)
NonHDL: 105.88
Total CHOL/HDL Ratio: 3
Triglycerides: 84 mg/dL (ref 0.0–149.0)
VLDL: 16.8 mg/dL (ref 0.0–40.0)

## 2020-11-03 LAB — VITAMIN D 25 HYDROXY (VIT D DEFICIENCY, FRACTURES): VITD: 35.11 ng/mL (ref 30.00–100.00)

## 2020-11-03 LAB — PSA: PSA: 1.41 ng/mL (ref 0.10–4.00)

## 2020-11-03 NOTE — Assessment & Plan Note (Signed)
-  Tdap   09/2019 -zostavax 2014, completed Shingrix - PNM 23 x1 66 (age ~39), completed per guidelines - Prevnar: 2016 -COVID vaccine x3. -  Had a flu shot -CCS: Dr. Watt Climes Cscope ~ 2007,Cscope 08-2009;Tubular adenoma per bx report; colonoscopy 09-2015, no polyps, 5 years.  GI referral - prostate ca screening:  DREtoday normal, no symptoms, check a PSA -Diet and exercise: Counseled at - labs:CMP, FLP, CBC, PSA -EtOH: Drinking 3 glasses of wine most nights, again advised that more than 2 drinks per day may be deleterious for his health.

## 2020-11-03 NOTE — Assessment & Plan Note (Signed)
Here for CPX HTN: BP today is very good, continue carvedilol, check ambulatory BPs. Anxiety, depression, insomnia: Currently on clonazepam twice daily, Ambien at night.  Contract and UDS signed. Hyponatremia: Next visit with nephrology should be 01/2021.  Checking labs Vitamin D deficiency: pt not sure if is taking supplements, rec 2000 units daily, checking levels. History of melanoma, SCC: sees derm frequently RTC 6 months

## 2020-11-03 NOTE — Patient Instructions (Addendum)
Check the  blood pressure  BP GOAL is between 110/65 and  135/85. If it is consistently higher or lower, let me know     GO TO THE LAB : Get the blood work     GO TO THE FRONT DESK, PLEASE SCHEDULE YOUR APPOINTMENTS Come back for a checkup in 6 months 

## 2020-11-03 NOTE — Progress Notes (Signed)
Subjective:    Patient ID: Bobby [name], male    DOB: Feb 25, 1951, 69 y.o.   MRN: [id number]  DOS:  11/03/2020 Type of visit - description: CPX Since the last office visit is doing well and has no major concerns.   Review of Systems   A 14 point review of systems is negative     Past Medical History:  Diagnosis Date  . Anxiety and depression   . Hypertension   . Hyponatremia 03-2013   ongoing, seeing Dr. Posey Pronto  . Insomnia   . Melanoma in situ of back ([place])    biopsy on 03/05/2020  . SIADH (syndrome of inappropriate ADH production) ([place]) 2017   Dr. Posey Pronto  . Squamous cell carcinoma in situ     Past Surgical History:  Procedure Laterality Date  . MOHS SURGERY  ~ 04/2020   face, SCC  . SHOULDER SURGERY  1980s   right shoulder    Allergies as of 11/03/2020      Reactions   Tramadol    REACTION: hives      Medication List       Accurate as of November 03, 2020  6:42 PM. If you have any questions, ask your nurse or doctor.        carvedilol 6.25 MG tablet Commonly known as: COREG Take 1 tablet (6.25 mg total) by mouth 2 (two) times daily with a meal.   cholecalciferol 25 MCG (1000 UNIT) tablet Commonly known as: VITAMIN D3 Take 1,000 Units by mouth daily.   clonazePAM 0.5 MG tablet Commonly known as: KLONOPIN TAKE ONE TABLET BY MOUTH TWICE A DAY AS NEEDED FOR ANXIETY   folic acid 1 MG tablet Commonly known as: FOLVITE Take 1 tablet (1 mg total) by mouth daily.   levocetirizine 5 MG tablet Commonly known as: XYZAL Take 1 tablet (5 mg total) by mouth daily as needed for allergies.   multivitamin with minerals Tabs tablet Take 1 tablet by mouth daily.   olopatadine 0.1 % ophthalmic solution Commonly known as: PATANOL Place 1 drop into both eyes 2 (two) times daily.   tadalafil 10 MG tablet Commonly known as: CIALIS Take 1-2 tablets (10-20 mg total) by mouth every other day as needed for erectile dysfunction.   thiamine 100 MG tablet Take 1  tablet (100 mg total) by mouth daily.   zolpidem 10 MG tablet Commonly known as: AMBIEN TAKE ONE TABLET BY MOUTH EVERY NIGHT AT BEDTIME AS NEEDED FOR SLEEP          Objective:   Physical Exam BP 126/81 (BP Location: Right Arm, Patient Position: Sitting, Cuff Size: Large)   Pulse 66   Temp 97.9 F (36.6 C) (Oral)   Ht 6' (1.829 m)   Wt 184 lb (83.5 kg)   SpO2 99%   BMI 24.95 kg/m  General: Well developed, NAD, BMI noted Neck: No  thyromegaly  HEENT:  Normocephalic . Face symmetric, atraumatic Lungs:  CTA B Normal respiratory effort, no intercostal retractions, no accessory muscle use. Heart: RRR,  no murmur.  Abdomen:  Not distended, soft, non-tender. No rebound or rigidity.   Lower extremities: no pretibial edema bilaterally DRE: Normal sphincter tone, brown stools, prostate normal. Skin: Exposed areas without rash. Not pale. Not jaundice Neurologic:  alert & oriented X3.  Speech normal, gait appropriate for age and unassisted Strength symmetric and appropriate for age.  Psych: Cognition and judgment appear intact.  Cooperative with normal attention span and concentration.  Behavior appropriate.  No anxious or depressed appearing.     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 MCI per neuro visit 12/2017 Hyponatremia onset 2014, admitted. 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 Anxiety, depression, insomnia: Currently on clonazepam twice daily, Ambien at night.  Contract and UDS signed. Hyponatremia: Next visit with nephrology should be 01/2021.  Checking labs Vitamin D deficiency: pt not sure if is taking supplements, rec  2000 units daily, checking levels. History of melanoma, SCC: sees derm frequently RTC 6 months  This visit occurred during the SARS-CoV-2 public health emergency.  Safety protocols were in place, including screening questions prior to the visit, additional usage of staff PPE, and extensive cleaning of exam room while observing appropriate contact time as indicated for disinfecting solutions.

## 2020-11-05 LAB — DRUG MONITORING, PANEL 8 WITH CONFIRMATION, URINE
6 Acetylmorphine: NEGATIVE ng/mL (ref <10)
Alcohol Metabolites: POSITIVE ng/mL — AB
Amphetamines: NEGATIVE ng/mL (ref <500)
Benzodiazepines: NEGATIVE ng/mL (ref <100)
Buprenorphine, Urine: NEGATIVE ng/mL (ref <5)
Cocaine Metabolite: NEGATIVE ng/mL (ref <150)
Codeine: NEGATIVE ng/mL (ref <50)
Creatinine: 57.2 mg/dL
Ethyl Glucuronide (ETG): 51551 ng/mL — ABNORMAL HIGH (ref <500)
Ethyl Sulfate (ETS): 10950 ng/mL — ABNORMAL HIGH (ref <100)
Hydrocodone: NEGATIVE ng/mL (ref <50)
Hydromorphone: NEGATIVE ng/mL (ref <50)
MDMA: NEGATIVE ng/mL (ref <500)
Marijuana Metabolite: NEGATIVE ng/mL (ref <20)
Morphine: NEGATIVE ng/mL (ref <50)
Norhydrocodone: NEGATIVE ng/mL (ref <50)
Opiates: NEGATIVE ng/mL (ref <100)
Oxidant: NEGATIVE ug/mL
Oxycodone: NEGATIVE ng/mL (ref <100)
pH: 7.1 (ref 4.5–9.0)

## 2020-11-05 LAB — DM TEMPLATE

## 2020-12-28 ENCOUNTER — Telehealth: Payer: Self-pay | Admitting: Internal Medicine

## 2020-12-29 NOTE — Telephone Encounter (Signed)
Requesting: clonazepam 0.5mg   Contract: 11/03/2020 UDS: 11/03/2020 Last Visit: 11/03/2020 Next Visit: 05/04/2021 Last Refill: 09/06/2020 #60 and 2RF Pt sig: 1 tab bid prn  Please Advise

## 2020-12-29 NOTE — Telephone Encounter (Signed)
PDMP okay, Rx sent 

## 2021-01-18 ENCOUNTER — Telehealth: Payer: Self-pay | Admitting: Internal Medicine

## 2021-01-18 NOTE — Telephone Encounter (Signed)
Requesting: Ambien 10mg  Contract: 11/03/2020 UDS: 11/03/2020 Last Visit: 11/03/2020 Next Visit: 05/04/2021 Last Refill: 10/12/2020 #90 and 5YK  Please Advise

## 2021-01-18 NOTE — Telephone Encounter (Signed)
PDMP okay, Rx sent 

## 2021-01-19 DIAGNOSIS — D225 Melanocytic nevi of trunk: Secondary | ICD-10-CM | POA: Diagnosis not present

## 2021-01-19 DIAGNOSIS — Z85828 Personal history of other malignant neoplasm of skin: Secondary | ICD-10-CM | POA: Diagnosis not present

## 2021-01-19 DIAGNOSIS — B351 Tinea unguium: Secondary | ICD-10-CM | POA: Diagnosis not present

## 2021-01-19 DIAGNOSIS — L814 Other melanin hyperpigmentation: Secondary | ICD-10-CM | POA: Diagnosis not present

## 2021-01-19 DIAGNOSIS — L718 Other rosacea: Secondary | ICD-10-CM | POA: Diagnosis not present

## 2021-01-19 DIAGNOSIS — D485 Neoplasm of uncertain behavior of skin: Secondary | ICD-10-CM | POA: Diagnosis not present

## 2021-01-19 DIAGNOSIS — L853 Xerosis cutis: Secondary | ICD-10-CM | POA: Diagnosis not present

## 2021-01-19 DIAGNOSIS — C4441 Basal cell carcinoma of skin of scalp and neck: Secondary | ICD-10-CM | POA: Diagnosis not present

## 2021-01-19 DIAGNOSIS — Z85820 Personal history of malignant melanoma of skin: Secondary | ICD-10-CM | POA: Diagnosis not present

## 2021-01-19 DIAGNOSIS — L905 Scar conditions and fibrosis of skin: Secondary | ICD-10-CM | POA: Diagnosis not present

## 2021-01-19 DIAGNOSIS — D229 Melanocytic nevi, unspecified: Secondary | ICD-10-CM | POA: Diagnosis not present

## 2021-02-17 DIAGNOSIS — E559 Vitamin D deficiency, unspecified: Secondary | ICD-10-CM | POA: Diagnosis not present

## 2021-02-17 DIAGNOSIS — E871 Hypo-osmolality and hyponatremia: Secondary | ICD-10-CM | POA: Diagnosis not present

## 2021-02-17 LAB — VITAMIN D 25 HYDROXY (VIT D DEFICIENCY, FRACTURES): Vit D, 25-Hydroxy: 37.3

## 2021-02-17 LAB — COMPREHENSIVE METABOLIC PANEL
Albumin: 4.7 (ref 3.5–5.0)
Calcium: 9 (ref 8.7–10.7)
GFR calc non Af Amer: 91

## 2021-02-17 LAB — CBC AND DIFFERENTIAL
HCT: 37 — AB (ref 41–53)
Hemoglobin: 12.9 — AB (ref 13.5–17.5)
Platelets: 247 (ref 150–399)
WBC: 11.5

## 2021-02-17 LAB — BASIC METABOLIC PANEL
BUN: 25 — AB (ref 4–21)
CO2: 22 (ref 13–22)
Chloride: 94 — AB (ref 99–108)
Creatinine: 0.9 (ref 0.6–1.3)
Glucose: 89
Potassium: 4.9 (ref 3.4–5.3)
Sodium: 131 — AB (ref 137–147)

## 2021-02-23 DIAGNOSIS — I10 Essential (primary) hypertension: Secondary | ICD-10-CM | POA: Diagnosis not present

## 2021-02-23 DIAGNOSIS — E559 Vitamin D deficiency, unspecified: Secondary | ICD-10-CM | POA: Diagnosis not present

## 2021-02-23 DIAGNOSIS — E871 Hypo-osmolality and hyponatremia: Secondary | ICD-10-CM | POA: Diagnosis not present

## 2021-02-28 ENCOUNTER — Encounter: Payer: Self-pay | Admitting: Internal Medicine

## 2021-03-10 ENCOUNTER — Other Ambulatory Visit: Payer: Self-pay

## 2021-03-10 DIAGNOSIS — C44219 Basal cell carcinoma of skin of left ear and external auricular canal: Secondary | ICD-10-CM | POA: Diagnosis not present

## 2021-03-30 DIAGNOSIS — D225 Melanocytic nevi of trunk: Secondary | ICD-10-CM | POA: Diagnosis not present

## 2021-03-30 DIAGNOSIS — B079 Viral wart, unspecified: Secondary | ICD-10-CM | POA: Diagnosis not present

## 2021-03-30 DIAGNOSIS — L111 Transient acantholytic dermatosis [Grover]: Secondary | ICD-10-CM | POA: Diagnosis not present

## 2021-03-30 DIAGNOSIS — L731 Pseudofolliculitis barbae: Secondary | ICD-10-CM | POA: Diagnosis not present

## 2021-03-30 DIAGNOSIS — L905 Scar conditions and fibrosis of skin: Secondary | ICD-10-CM | POA: Diagnosis not present

## 2021-03-30 DIAGNOSIS — L570 Actinic keratosis: Secondary | ICD-10-CM | POA: Diagnosis not present

## 2021-03-30 DIAGNOSIS — L814 Other melanin hyperpigmentation: Secondary | ICD-10-CM | POA: Diagnosis not present

## 2021-03-30 DIAGNOSIS — L989 Disorder of the skin and subcutaneous tissue, unspecified: Secondary | ICD-10-CM | POA: Diagnosis not present

## 2021-03-30 DIAGNOSIS — D1801 Hemangioma of skin and subcutaneous tissue: Secondary | ICD-10-CM | POA: Diagnosis not present

## 2021-03-30 DIAGNOSIS — Z85828 Personal history of other malignant neoplasm of skin: Secondary | ICD-10-CM | POA: Diagnosis not present

## 2021-03-30 DIAGNOSIS — D485 Neoplasm of uncertain behavior of skin: Secondary | ICD-10-CM | POA: Diagnosis not present

## 2021-03-30 DIAGNOSIS — L821 Other seborrheic keratosis: Secondary | ICD-10-CM | POA: Diagnosis not present

## 2021-04-01 DIAGNOSIS — B079 Viral wart, unspecified: Secondary | ICD-10-CM | POA: Diagnosis not present

## 2021-04-29 ENCOUNTER — Telehealth: Payer: Self-pay | Admitting: Internal Medicine

## 2021-05-02 NOTE — Telephone Encounter (Signed)
PDMP okay, Rx sent 

## 2021-05-02 NOTE — Telephone Encounter (Signed)
Requesting: Ambien 10mg  Contract: 11/03/2020 UDS: 11/03/2020 Last Visit: 11/03/2020 Next Visit: 05/04/2021 Last Refill: 01/18/2021 #90 and 0RF  Please Advise

## 2021-05-04 ENCOUNTER — Other Ambulatory Visit: Payer: Self-pay

## 2021-05-04 ENCOUNTER — Ambulatory Visit (INDEPENDENT_AMBULATORY_CARE_PROVIDER_SITE_OTHER): Payer: Medicare HMO | Admitting: Internal Medicine

## 2021-05-04 ENCOUNTER — Encounter: Payer: Self-pay | Admitting: Internal Medicine

## 2021-05-04 VITALS — BP 126/68 | HR 76 | Temp 97.9°F | Resp 16 | Ht 72.0 in | Wt 175.1 lb

## 2021-05-04 DIAGNOSIS — I10 Essential (primary) hypertension: Secondary | ICD-10-CM | POA: Diagnosis not present

## 2021-05-04 DIAGNOSIS — E871 Hypo-osmolality and hyponatremia: Secondary | ICD-10-CM | POA: Diagnosis not present

## 2021-05-04 DIAGNOSIS — G4700 Insomnia, unspecified: Secondary | ICD-10-CM | POA: Diagnosis not present

## 2021-05-04 LAB — BASIC METABOLIC PANEL
BUN: 22 mg/dL (ref 6–23)
CO2: 28 mEq/L (ref 19–32)
Calcium: 9.2 mg/dL (ref 8.4–10.5)
Chloride: 96 mEq/L (ref 96–112)
Creatinine, Ser: 1.01 mg/dL (ref 0.40–1.50)
GFR: 74.53 mL/min (ref >60.00)
Glucose, Bld: 96 mg/dL (ref 70–99)
Potassium: 5 mEq/L (ref 3.5–5.1)
Sodium: 130 mEq/L — ABNORMAL LOW (ref 135–145)

## 2021-05-04 NOTE — Patient Instructions (Signed)
Check the  blood pressure  BP GOAL is between 110/65 and  135/85. If it is consistently higher or lower, let me know   GO TO THE LAB : Get the blood work     [place], [place] back for a physical exam, fasting by 10/2021

## 2021-05-04 NOTE — Progress Notes (Signed)
Subjective:    Patient ID: Bobby [name], male    DOB: Nov 13, 1951, 70 y.o.   MRN: [id number]  DOS:  05/04/2021 Type of visit - description: F/U Since the last office visit he is feeling well. Has lost weight, he is more active. We talk about hyponatremia, vitamin D, anxiety and insomnia. Note from nephrology reviewed  Wt Readings from Last 3 Encounters:  05/04/21 175 lb 2 oz (79.4 kg)  11/03/20 184 lb (83.5 kg)  05/12/20 183 lb 8 oz (83.2 kg)     Review of Systems See above   Past Medical History:  Diagnosis Date   Anxiety and depression    Hypertension    Hyponatremia 03-2013   ongoing, seeing Dr. Posey Pronto   Insomnia    Melanoma in situ of back [facility])    biopsy on 03/05/2020   SIADH (syndrome of inappropriate ADH production) ([place]) 2017   Dr. Posey Pronto   Squamous cell carcinoma in situ     Past Surgical History:  Procedure Laterality Date   MOHS SURGERY  ~ 04/2020   face, [facility]   SHOULDER SURGERY  1980s   right shoulder    Allergies as of 05/04/2021       Reactions   Tramadol    REACTION: hives        Medication List        Accurate as of May 04, 2021 11:59 PM. If you have any questions, ask your nurse or doctor.          carvedilol 6.25 MG tablet Commonly known as: COREG Take 1 tablet (6.25 mg total) by mouth 2 (two) times daily with a meal.   cholecalciferol 25 MCG (1000 UNIT) tablet Commonly known as: VITAMIN D3 Take 1,000 Units by mouth daily.   clonazePAM 0.5 MG tablet Commonly known as: KLONOPIN TAKE ONE TABLET BY MOUTH TWICE A DAY AS NEEDED FOR ANXIETY   folic acid 1 MG tablet Commonly known as: FOLVITE Take 1 tablet (1 mg total) by mouth daily.   [place]ia 10 % Soln Generic drug: Efinaconazole SMARTSIG:Sparingly Topical Daily   Kerydin 5 % Soln Generic drug: Tavaborole Apply topically daily.   levocetirizine 5 MG tablet Commonly known as: XYZAL Take 1 tablet (5 mg total) by mouth daily as needed for allergies.   multivitamin with  minerals Tabs tablet Take 1 tablet by mouth daily.   olopatadine 0.1 % ophthalmic solution Commonly known as: PATANOL Place 1 drop into both eyes 2 (two) times daily.   tadalafil 10 MG tablet Commonly known as: CIALIS Take 1-2 tablets (10-20 mg total) by mouth every other day as needed for erectile dysfunction.   thiamine 100 MG tablet Take 1 tablet (100 mg total) by mouth daily.   zolpidem 10 MG tablet Commonly known as: AMBIEN Take 0.5 tablets (5 mg total) by mouth at bedtime as needed for sleep. What changed: See the new instructions. Changed by: Kathlene November, MD           Objective:   Physical Exam BP 126/68 (BP Location: Left Arm, Patient Position: Sitting, Cuff Size: Small)   Pulse 76   Temp 97.9 F (36.6 C) (Oral)   Resp 16   Ht 6' (1.829 m)   Wt 175 lb 2 oz (79.4 kg)   SpO2 98%   BMI 23.75 kg/m  General:   Well developed, NAD, BMI noted. HEENT:  Normocephalic . Face symmetric, atraumatic Lungs:  CTA B Normal respiratory effort, no intercostal retractions, no accessory  muscle use. Heart: RRR,  no murmur.  Lower extremities: no pretibial edema bilaterally  Skin: Not pale. Not jaundice Neurologic:  alert & oriented X3.  Speech normal, gait appropriate for age and unassisted Psych--  Cognition and judgment appear intact.  Cooperative with normal attention span and concentration.  Behavior appropriate. No anxious or depressed appearing.      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 MCI per neuro visit 12/2017 Hyponatremia onset 2014, admitted. 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 Hyponatremia Saw nephrology April 2022.  They noted  normal renal function with borderline hyponatremia, sodium 132.  They rec fluid restriction to less than 40 ounces and limiting EtOH.  Was rec to RTC 1 year Anxiety, depression, insomnia: On clonazepam, takes either half or 1 tablet twice daily as needed.  Occasionally takes Ambien, rec to decrease ambien to 5 mg as needed. Daily EtOH: Reports no more than 2 drinks daily Vitamin D deficiency: On 1000 units daily. Preventive care: Patient reports he had 4 COVID vaccines RTC 6 months CPX    This visit occurred during the SARS-CoV-2 public health emergency.  Safety protocols were in place, including screening questions prior to the visit, additional usage of staff PPE, and extensive cleaning of exam room while observing appropriate contact time as indicated for disinfecting solutions.

## 2021-05-05 NOTE — Assessment & Plan Note (Signed)
HTN: BP very good today, continue carvedilol. Hyponatremia Saw nephrology April 2022.  They noted normal renal function with borderline hyponatremia, sodium 132.  They rec fluid restriction to less than 40 ounces and limiting EtOH.  Was rec to RTC 1 year Anxiety, depression, insomnia: On clonazepam, takes either half or 1 tablet twice daily as needed.  Occasionally takes Ambien, rec to decrease ambien to 5 mg as needed. Daily EtOH: Reports no more than 2 drinks daily Vitamin D deficiency: On 1000 units daily. Preventive care: Patient reports he had 4 COVID vaccines RTC 6 months CPX

## 2021-05-17 DIAGNOSIS — Z86010 Personal history of colonic polyps: Secondary | ICD-10-CM | POA: Diagnosis not present

## 2021-05-17 DIAGNOSIS — D128 Benign neoplasm of rectum: Secondary | ICD-10-CM | POA: Diagnosis not present

## 2021-05-17 DIAGNOSIS — K5730 Diverticulosis of large intestine without perforation or abscess without bleeding: Secondary | ICD-10-CM | POA: Diagnosis not present

## 2021-05-17 LAB — HM COLONOSCOPY

## 2021-05-20 DIAGNOSIS — D128 Benign neoplasm of rectum: Secondary | ICD-10-CM | POA: Diagnosis not present

## 2021-06-14 ENCOUNTER — Other Ambulatory Visit: Payer: Self-pay | Admitting: Internal Medicine

## 2021-06-29 DIAGNOSIS — L821 Other seborrheic keratosis: Secondary | ICD-10-CM | POA: Diagnosis not present

## 2021-06-29 DIAGNOSIS — L570 Actinic keratosis: Secondary | ICD-10-CM | POA: Diagnosis not present

## 2021-06-29 DIAGNOSIS — L718 Other rosacea: Secondary | ICD-10-CM | POA: Diagnosis not present

## 2021-06-29 DIAGNOSIS — Z85820 Personal history of malignant melanoma of skin: Secondary | ICD-10-CM | POA: Diagnosis not present

## 2021-06-29 DIAGNOSIS — L905 Scar conditions and fibrosis of skin: Secondary | ICD-10-CM | POA: Diagnosis not present

## 2021-06-29 DIAGNOSIS — Z85828 Personal history of other malignant neoplasm of skin: Secondary | ICD-10-CM | POA: Diagnosis not present

## 2021-06-29 DIAGNOSIS — D225 Melanocytic nevi of trunk: Secondary | ICD-10-CM | POA: Diagnosis not present

## 2021-06-29 DIAGNOSIS — L308 Other specified dermatitis: Secondary | ICD-10-CM | POA: Diagnosis not present

## 2021-06-29 DIAGNOSIS — D1801 Hemangioma of skin and subcutaneous tissue: Secondary | ICD-10-CM | POA: Diagnosis not present

## 2021-06-29 DIAGNOSIS — D485 Neoplasm of uncertain behavior of skin: Secondary | ICD-10-CM | POA: Diagnosis not present

## 2021-06-29 DIAGNOSIS — L249 Irritant contact dermatitis, unspecified cause: Secondary | ICD-10-CM | POA: Diagnosis not present

## 2021-07-10 ENCOUNTER — Telehealth: Payer: Self-pay | Admitting: Internal Medicine

## 2021-07-11 NOTE — Telephone Encounter (Signed)
PDMP okay, Rx sent 

## 2021-07-11 NOTE — Telephone Encounter (Signed)
Requesting: clonazepam 0.'5mg'$  Contract: 11/03/2020 UDS:11/03/2020 Last Visit: 05/04/21 Next Visit: 11/02/2021 Last Refill: 12/29/2020 #60 and 3RF  Please Advise

## 2021-08-10 ENCOUNTER — Other Ambulatory Visit: Payer: Self-pay

## 2021-08-10 ENCOUNTER — Telehealth: Payer: Self-pay

## 2021-08-10 ENCOUNTER — Ambulatory Visit (INDEPENDENT_AMBULATORY_CARE_PROVIDER_SITE_OTHER): Payer: Medicare HMO

## 2021-08-10 VITALS — BP 140/82 | HR 59 | Temp 97.7°F | Resp 16 | Ht 72.0 in | Wt 173.0 lb

## 2021-08-10 DIAGNOSIS — Z0000 Encounter for general adult medical examination without abnormal findings: Secondary | ICD-10-CM | POA: Diagnosis not present

## 2021-08-10 MED ORDER — ZOLPIDEM TARTRATE 10 MG PO TABS: 10.0000 mg | ORAL_TABLET | Freq: Every evening | ORAL | 0 refills | Status: DC | PRN

## 2021-08-10 NOTE — Telephone Encounter (Signed)
Spoke w/ Pt- informed of recommendations. Pt verbalized understanding, he will also keep trying to take 1/2 tablet as much as he can but will use 1 whole tablet if needed.

## 2021-08-10 NOTE — Telephone Encounter (Signed)
Advise patient, at the last visit we agreed to try to decrease Ambien 10 mg to half tablet at bedtime.  If that is not working okay to go back on a full tablet at bedtime. PDMP reviewed, refill sent.

## 2021-08-10 NOTE — Patient Instructions (Signed)
Bobby [name] , Thank you for taking time to come for your Medicare Wellness Visit. I appreciate your ongoing commitment to your health goals. Please review the following plan we discussed and let me know if I can assist you in the future.   Screening recommendations/referrals: Colonoscopy: Completed 3 months ago(per our conversation). Awaiting report from GI Recommended yearly ophthalmology/optometry visit for glaucoma screening and checkup Recommended yearly dental visit for hygiene and checkup  Vaccinations: Influenza vaccine: Due-May obtain vaccine at our office or your local pharmacy. Pneumococcal vaccine: Up to date Tdap vaccine: Up to date-Due 10/12/2029 Shingles vaccine: Completed vaccines   Covid-19: Booster available at the pharmacy.  Advanced directives: Copy in chart  Conditions/risks identified: See problem list  Next appointment: Follow up in one year for your annual wellness visit. 08/16/2022 @ 10:20  Preventive Care 65 Years and Older, Male Preventive care refers to lifestyle choices and visits with your health care provider that can promote health and wellness. What does preventive care include? A yearly physical exam. This is also called an annual well check. Dental exams once or twice a year. Routine eye exams. Ask your health care provider how often you should have your eyes checked. Personal lifestyle choices, including: Daily care of your teeth and gums. Regular physical activity. Eating a healthy diet. Avoiding tobacco and drug use. Limiting alcohol use. Practicing safe sex. Taking low doses of aspirin every day. Taking vitamin and mineral supplements as recommended by your health care provider. What happens during an annual well check? The services and screenings done by your health care provider during your annual well check will depend on your age, overall health, lifestyle risk factors, and family history of disease. Counseling  Your health care  provider may ask you questions about your: Alcohol use. Tobacco use. Drug use. Emotional well-being. Home and relationship well-being. Sexual activity. Eating habits. History of falls. Memory and ability to understand (cognition). Work and work Statistician. Screening  You may have the following tests or measurements: Height, weight, and BMI. Blood pressure. Lipid and cholesterol levels. These may be checked every 5 years, or more frequently if you are over 25 years old. Skin check. Lung cancer screening. You may have this screening every year starting at age 25 if you have a 30-pack-year history of smoking and currently smoke or have quit within the past 15 years. Fecal occult blood test (FOBT) of the stool. You may have this test every year starting at age 94. Flexible sigmoidoscopy or colonoscopy. You may have a sigmoidoscopy every 5 years or a colonoscopy every 10 years starting at age 37. Prostate cancer screening. Recommendations will vary depending on your family history and other risks. Hepatitis C blood test. Hepatitis B blood test. Sexually transmitted disease (STD) testing. Diabetes screening. This is done by checking your blood sugar (glucose) after you have not eaten for a while (fasting). You may have this done every 1-3 years. Abdominal aortic aneurysm (AAA) screening. You may need this if you are a current or former smoker. Osteoporosis. You may be screened starting at age 55 if you are at high risk. Talk with your health care provider about your test results, treatment options, and if necessary, the need for more tests. Vaccines  Your health care provider may recommend certain vaccines, such as: Influenza vaccine. This is recommended every year. Tetanus, diphtheria, and acellular pertussis (Tdap, Td) vaccine. You may need a Td booster every 10 years. Zoster vaccine. You may need this after age 64. Pneumococcal  13-valent conjugate (PCV13) vaccine. One dose is  recommended after age 47. Pneumococcal polysaccharide (PPSV23) vaccine. One dose is recommended after age 13. Talk to your health care provider about which screenings and vaccines you need and how often you need them. This information is not intended to replace advice given to you by your health care provider. Make sure you discuss any questions you have with your health care provider. Document Released: 12/03/2015 Document Revised: 07/26/2016 Document Reviewed: 09/07/2015 Elsevier Interactive Patient Education  2017 [place] Prevention in the Home Falls can cause injuries. They can happen to people of all ages. There are many things you can do to make your home safe and to help prevent falls. What can I do on the outside of my home? Regularly fix the edges of walkways and driveways and fix any cracks. Remove anything that might make you trip as you walk through a door, such as a raised step or threshold. Trim any bushes or trees on the path to your home. Use bright outdoor lighting. Clear any walking paths of anything that might make someone trip, such as rocks or tools. Regularly check to see if handrails are loose or broken. Make sure that both sides of any steps have handrails. Any raised decks and porches should have guardrails on the edges. Have any leaves, snow, or ice cleared regularly. Use sand or salt on walking paths during winter. Clean up any spills in your garage right away. This includes oil or grease spills. What can I do in the bathroom? Use night lights. Install grab bars by the toilet and in the tub and shower. Do not use towel bars as grab bars. Use non-skid mats or decals in the tub or shower. If you need to sit down in the shower, use a plastic, non-slip stool. Keep the floor dry. Clean up any water that spills on the floor as soon as it happens. Remove soap buildup in the tub or shower regularly. Attach bath mats securely with double-sided non-slip rug  tape. Do not have throw rugs and other things on the floor that can make you trip. What can I do in the bedroom? Use night lights. Make sure that you have a light by your bed that is easy to reach. Do not use any sheets or blankets that are too big for your bed. They should not hang down onto the floor. Have a firm chair that has side arms. You can use this for support while you get dressed. Do not have throw rugs and other things on the floor that can make you trip. What can I do in the kitchen? Clean up any spills right away. Avoid walking on wet floors. Keep items that you use a lot in easy-to-reach places. If you need to reach something above you, use a strong step stool that has a grab bar. Keep electrical cords out of the way. Do not use floor polish or wax that makes floors slippery. If you must use wax, use non-skid floor wax. Do not have throw rugs and other things on the floor that can make you trip. What can I do with my stairs? Do not leave any items on the stairs. Make sure that there are handrails on both sides of the stairs and use them. Fix handrails that are broken or loose. Make sure that handrails are as long as the stairways. Check any carpeting to make sure that it is firmly attached to the stairs. Fix any carpet  that is loose or worn. Avoid having throw rugs at the top or bottom of the stairs. If you do have throw rugs, attach them to the floor with carpet tape. Make sure that you have a light switch at the top of the stairs and the bottom of the stairs. If you do not have them, ask someone to add them for you. What else can I do to help prevent falls? Wear shoes that: Do not have high heels. Have rubber bottoms. Are comfortable and fit you well. Are closed at the toe. Do not wear sandals. If you use a stepladder: Make sure that it is fully opened. Do not climb a closed stepladder. Make sure that both sides of the stepladder are locked into place. Ask someone to  hold it for you, if possible. Clearly mark and make sure that you can see: Any grab bars or handrails. First and last steps. Where the edge of each step is. Use tools that help you move around (mobility aids) if they are needed. These include: Canes. Walkers. Scooters. Crutches. Turn on the lights when you go into a dark area. Replace any light bulbs as soon as they burn out. Set up your furniture so you have a clear path. Avoid moving your furniture around. If any of your floors are uneven, fix them. If there are any pets around you, be aware of where they are. Review your medicines with your doctor. Some medicines can make you feel dizzy. This can increase your chance of falling. Ask your doctor what other things that you can do to help prevent falls. This information is not intended to replace advice given to you by your health care provider. Make sure you discuss any questions you have with your health care provider. Document Released: 09/02/2009 Document Revised: 04/13/2016 Document Reviewed: 12/11/2014 Elsevier Interactive Patient Education  2017 Elsevier In

## 2021-08-10 NOTE — Telephone Encounter (Signed)
Patient states he has been breaking the 5mg  Ambien in half & it is not really helping him. He wants to know if he can take a whole one. If so he will need a refill.

## 2021-08-10 NOTE — Addendum Note (Signed)
Addended by: Colon Branch on: 08/10/2021 02:38 PM   Modules accepted: Orders

## 2021-08-10 NOTE — Progress Notes (Addendum)
Subjective:   Bobby [name] is a 70 y.o. male who presents for Medicare Annual/Subsequent preventive examination.  Review of Systems     Cardiac Risk Factors include: advanced age (>4men, >65 women);male gender;hypertension     Objective:    Today's Vitals   08/10/21 1020  BP: 140/82  Pulse: (!) 59  Resp: 16  Temp: 97.7 F (36.5 C)  TempSrc: Temporal  SpO2: 97%  Weight: 173 lb (78.5 kg)  Height: 6' (1.829 m)   Body mass index is 23.46 kg/m.  Advanced Directives 08/10/2021 04/13/2013  Does Patient Have a Medical Advance Directive? Yes Patient does not have advance directive  Type of Advance Directive [place];Living will -  Copy of [place] in Chart? Yes - validated most recent copy scanned in chart (See row information) -  Pre-existing out of facility DNR order (yellow form or pink MOST form) - No    Current Medications (verified) Outpatient Encounter Medications as of 08/10/2021  Medication Sig   carvedilol (COREG) 6.25 MG tablet TAKE ONE TABLET BY MOUTH TWICE A DAY WITH A MEAL   cholecalciferol (VITAMIN D3) 25 MCG (1000 UNIT) tablet Take 1,000 Units by mouth daily.   clonazePAM (KLONOPIN) 0.5 MG tablet TAKE ONE TABLET BY MOUTH TWICE A DAY AS NEEDED FOR ANXIETY   folic acid (FOLVITE) 1 MG tablet Take 1 tablet (1 mg total) by mouth daily.   JUBLIA 10 % SOLN SMARTSIG:Sparingly Topical Daily   KERYDIN 5 % SOLN Apply topically daily.   Multiple Vitamin (MULTIVITAMIN WITH MINERALS) TABS Take 1 tablet by mouth daily.   tadalafil (CIALIS) 10 MG tablet TAKE 1-2 TABLETS (10-20 MG TOTAL) BY MOUTH EVERY OTHER DAY AS NEEDED FOR ERECTILE DYSFUNCTION.   thiamine 100 MG tablet Take 1 tablet (100 mg total) by mouth daily.   zolpidem (AMBIEN) 10 MG tablet Take 0.5 tablets (5 mg total) by mouth at bedtime as needed for sleep.   levocetirizine (XYZAL) 5 MG tablet Take 1 tablet (5 mg total) by mouth daily as needed for allergies. (Patient not  taking: No sig reported)   olopatadine (PATANOL) 0.1 % ophthalmic solution Place 1 drop into both eyes 2 (two) times daily. (Patient not taking: No sig reported)   No facility-administered encounter medications on file as of 08/10/2021.    Allergies (verified) Tramadol   History: Past Medical History:  Diagnosis Date   Anxiety and depression    Hypertension    Hyponatremia 03-2013   ongoing, seeing Dr. 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 Posey Pronto   Squamous cell carcinoma in situ    Past Surgical History:  Procedure Laterality Date   MOHS SURGERY  ~ 04/2020   face, [facility]   SHOULDER SURGERY  1980s   right shoulder   Family History  Problem Relation Age of Onset   Hypertension Mother    Stroke Mother    Diabetes Mother    Emphysema Mother        smoked   Thyroid disease Mother    Alcoholism Father    Colon cancer Neg Hx    Prostate cancer Neg Hx    CAD Neg Hx    Social History   Socioeconomic History   Marital status: Married    Spouse name: Not on file   Number of children: 2  Years of education: 61   Highest education level: Bachelor's degree (e.g., BA, AB, BS)  Occupational History   Occupation: semi-retired--business owner   Tobacco Use   Smoking status: Never   Smokeless tobacco: Never  Vaping Use   Vaping Use: Never used  Substance and Sexual Activity   Alcohol use: Yes    Alcohol/week: 0.0 standard drinks    Comment: socially , 2 to 3 drinks  a day   Drug use: No   Sexual activity: Yes  Other Topics Concern   Not on file  Social History Narrative   [name], Married,    2 children   mom passed away 2008-05-11   Right-handed.   2-3 cups coffee per day.   Lives at home with wife.            Social Determinants of Health   Financial Resource Strain: Low Risk    Difficulty of Paying Living Expenses: Not hard at all  Food Insecurity: No Food  Insecurity   Worried About Charity fundraiser in the Last Year: Never true   [place] in the Last Year: Never true  Transportation Needs: No Transportation Needs   Lack of Transportation (Medical): No   Lack of Transportation (Non-Medical): No  Physical Activity: Insufficiently Active   Days of Exercise per Week: 3 days   Minutes of Exercise per Session: 40 min  Stress: No Stress Concern Present   Feeling of Stress : Only a little  Social Connections: Moderately Isolated   Frequency of Communication with Friends and Family: More than three times a week   Frequency of Social Gatherings with Friends and Family: More than three times a week   Attends Religious Services: Never   Marine scientist or Organizations: No   Attends Music therapist: Never   Marital Status: Married    Tobacco Counseling Counseling given: Not Answered   Clinical Intake:  Pre-visit preparation completed: No  Pain : No/denies pain     BMI - recorded: 24.11 Nutritional Status: BMI of 19-24  Normal Nutritional Risks: None Diabetes: No  How often do you need to have someone help you when you read instructions, pamphlets, or other written materials from your doctor or pharmacy?: 1 - Never  Diabetic?No  Interpreter Needed?: No  Information entered by :: Caroleen Hamman LPN   Activities of Daily Living In your present state of health, do you have any difficulty performing the following activities: 08/10/2021  Hearing? N  Vision? N  Difficulty concentrating or making decisions? N  Walking or climbing stairs? N  Dressing or bathing? N  Doing errands, shopping? N  Preparing Food and eating ? N  Using the Toilet? N  In the past six months, have you accidently leaked urine? N  Do you have problems with loss of bowel control? N  Managing your Medications? N  Managing your Finances? N  Housekeeping or managing your Housekeeping? N  Some recent data might be hidden     Patient Care Team: Colon Branch, MD as PCP - General Tanda Rockers, MD as Consulting Physician (Pulmonary Disease) Elmarie Shiley, MD as Consulting Physician (Nephrology) Elayne Snare, MD as Consulting Physician (Endocrinology) Clarene Essex, MD as Consulting Physician (Gastroenterology)  Indicate any recent Medical Services you may have received from other than [facility] providers in the past year (date may be approximate).     Assessment:   This is a routine wellness examination for [place].  Hearing/Vision screen Hearing Screening - Comments:: No issues Vision Screening - Comments:: Last eye exam-2021-Dr.Miller  Dietary issues and exercise activities discussed: Current Exercise Habits: Home exercise routine, Type of exercise: Other - see comments (biking), Time (Minutes): 40, Frequency (Times/Week): 3, Weekly Exercise (Minutes/Week): 120, Intensity: Mild, Exercise limited by: None identified   Goals Addressed             This Visit's Progress    Patient Stated       Start bike riding & lift weights       Depression Screen PHQ 2/9 Scores 08/10/2021 05/04/2021 05/12/2020 09/11/2018 08/21/2017 06/21/2016 03/08/2016  PHQ - 2 Score 0 0 0 0 0 0 0  PHQ- 9 Score - - 0 - - - -    Fall Risk Fall Risk  08/10/2021 05/04/2021 05/12/2020 09/11/2018 08/21/2017  Falls in the past year? 0 0 0 Yes No  Number falls in past yr: 0 0 0 1 -  Injury with Fall? 0 0 0 Yes -  Follow up Falls prevention discussed - Falls evaluation completed Falls evaluation completed;Education provided -    FALL RISK PREVENTION PERTAINING TO THE HOME:  Any stairs in or around the home? Yes  If so, are there any without handrails? No  Home free of loose throw rugs in walkways, pet beds, electrical cords, etc? Yes  Adequate lighting in your home to reduce risk of falls? Yes   ASSISTIVE DEVICES UTILIZED TO PREVENT FALLS:  Life alert? No  Use of a cane, walker or w/c? No  Grab bars in the bathroom? Yes  Shower chair or  bench in shower? No  Elevated toilet seat or a handicapped toilet? No   TIMED UP AND GO:  Was the test performed? Yes .  Length of time to ambulate 10 feet: 10 sec.   Gait steady and fast without use of assistive device  Cognitive Function:Normal cognitive status assessed by direct observation by this Nurse Health Advisor. No abnormalities found.   MMSE - Mini Mental State Exam 12/24/2017  Orientation to time 5  Orientation to Place 5  Registration 3  Attention/ Calculation 5  Recall 2  Language- name 2 objects 2  Language- repeat 1  Language- follow 3 step command 3  Language- read & follow direction 1  Write a sentence 1  Copy design 1  Total score 29        Immunizations Immunization History  Administered Date(s) Administered   Influenza, High Dose Seasonal PF 09/09/2013, 11/06/2016, 08/21/2017, 10/30/2018, 10/13/2019   Influenza-Unspecified 09/16/2014, 09/10/2015, 09/03/2020   PFIZER(Purple Top)SARS-COV-2 Vaccination 12/13/2019, 01/03/2020, 09/03/2020, 03/14/2021   Pneumococcal Conjugate-13 05/13/2015   Pneumococcal Polysaccharide-23 05/04/2014   Td 01/29/2009   Tdap 10/13/2019   Zoster Recombinat (Shingrix) 01/01/2017, 03/01/2017   Zoster, Live 03/17/2013    TDAP status: Up to date  Flu Vaccine status: Due, Education has been provided regarding the importance of this vaccine. Advised may receive this vaccine at local pharmacy or Health Dept. Aware to provide a copy of the vaccination record if obtained from local pharmacy or Health Dept. Verbalized acceptance and understanding.  Pneumococcal vaccine status: Up to date  Covid-19 vaccine status: Information provided on how to obtain vaccines. Booster due  Qualifies for Shingles Vaccine? No   Zostavax completed Yes   Shingrix Completed?: Yes  Screening Tests Health Maintenance  Topic Date Due   COLONOSCOPY (Pts 45-21yrs Insurance coverage will need to be confirmed)  09/30/2020   INFLUENZA VACCINE  06/20/2021  COVID-19 Vaccine (5 - Booster for Pfizer series) 07/14/2021   TETANUS/TDAP  10/12/2029   Hepatitis C Screening  Completed   Zoster Vaccines- Shingrix  Completed   HPV VACCINES  Aged Out    Health Maintenance  Health Maintenance Due  Topic Date Due   COLONOSCOPY (Pts 45-77yrs Insurance coverage will need to be confirmed)  09/30/2020   INFLUENZA VACCINE  06/20/2021   COVID-19 Vaccine (5 - Booster for Pfizer series) 07/14/2021    Colorectal cancer screening: Type of screening: Colonoscopy. Completed 04/2021 per patient. Repeat every 3 years-per patient. Awaiting report from GI.  Lung Cancer Screening: (Low Dose CT Chest recommended if Age 19-80 years, 30 pack-year currently smoking OR have quit w/in 15years.) does not qualify.     Additional Screening:  Hepatitis C Screening: Completed 08/21/2017  Vision Screening: Recommended annual ophthalmology exams for early detection of glaucoma and other disorders of the eye. Is the patient up to date with their annual eye exam?  Yes  Who is the provider or what is the name of the office in which the patient attends annual eye exams? Dr. Sabra Heck   Dental Screening: Recommended annual dental exams for proper oral hygiene  Community Resource Referral / Chronic Care Management: CRR required this visit?  No   CCM required this visit?  No      Plan:     I have personally reviewed and noted the following in the patient's chart:   Medical and social history Use of alcohol, tobacco or illicit drugs  Current medications and supplements including opioid prescriptions. Patient is not currently taking opioid prescriptions. Functional ability and status Nutritional status Physical activity Advanced directives List of other physicians Hospitalizations, surgeries, and ER visits in previous 12 months Vitals Screenings to include cognitive, depression, and falls Referrals and appointments  In addition, I have reviewed and discussed with  patient certain preventive protocols, quality metrics, and best practice recommendations. A written personalized care plan for preventive services as well as general preventive health recommendations were provided to patient.   Patient to access avs on my chart.  Marta Antu, LPN   [id number]  Nurse Health Advisor  Nurse Notes: None

## 2021-08-11 ENCOUNTER — Telehealth: Payer: Self-pay

## 2021-08-11 NOTE — Telephone Encounter (Signed)
PA approved.

## 2021-08-11 NOTE — Telephone Encounter (Signed)
PA initiated via Covermymeds; KEY: [id number]. Awaiting determination.

## 2021-08-12 NOTE — Telephone Encounter (Signed)
Effective 11/20/2020 to 11/19/2021

## 2021-09-13 DIAGNOSIS — L111 Transient acantholytic dermatosis [Grover]: Secondary | ICD-10-CM | POA: Diagnosis not present

## 2021-09-13 DIAGNOSIS — D692 Other nonthrombocytopenic purpura: Secondary | ICD-10-CM | POA: Diagnosis not present

## 2021-09-15 ENCOUNTER — Encounter: Payer: Self-pay | Admitting: Internal Medicine

## 2021-09-28 DIAGNOSIS — D2362 Other benign neoplasm of skin of left upper limb, including shoulder: Secondary | ICD-10-CM | POA: Diagnosis not present

## 2021-09-28 DIAGNOSIS — D692 Other nonthrombocytopenic purpura: Secondary | ICD-10-CM | POA: Diagnosis not present

## 2021-09-28 DIAGNOSIS — L718 Other rosacea: Secondary | ICD-10-CM | POA: Diagnosis not present

## 2021-09-28 DIAGNOSIS — L02222 Furuncle of back [any part, except buttock]: Secondary | ICD-10-CM | POA: Diagnosis not present

## 2021-09-28 DIAGNOSIS — D485 Neoplasm of uncertain behavior of skin: Secondary | ICD-10-CM | POA: Diagnosis not present

## 2021-09-28 DIAGNOSIS — L02821 Furuncle of head [any part, except face]: Secondary | ICD-10-CM | POA: Diagnosis not present

## 2021-09-28 DIAGNOSIS — Z85820 Personal history of malignant melanoma of skin: Secondary | ICD-10-CM | POA: Diagnosis not present

## 2021-09-28 DIAGNOSIS — L111 Transient acantholytic dermatosis [Grover]: Secondary | ICD-10-CM | POA: Diagnosis not present

## 2021-09-28 DIAGNOSIS — L814 Other melanin hyperpigmentation: Secondary | ICD-10-CM | POA: Diagnosis not present

## 2021-09-28 DIAGNOSIS — D225 Melanocytic nevi of trunk: Secondary | ICD-10-CM | POA: Diagnosis not present

## 2021-09-28 DIAGNOSIS — L821 Other seborrheic keratosis: Secondary | ICD-10-CM | POA: Diagnosis not present

## 2021-10-05 DIAGNOSIS — I10 Essential (primary) hypertension: Secondary | ICD-10-CM | POA: Diagnosis not present

## 2021-10-05 DIAGNOSIS — H5202 Hypermetropia, left eye: Secondary | ICD-10-CM | POA: Diagnosis not present

## 2021-10-20 ENCOUNTER — Telehealth: Payer: Self-pay | Admitting: Internal Medicine

## 2021-10-20 NOTE — Telephone Encounter (Signed)
Requesting: clonazepam 0.5mg  Contract: 11/03/2020 UDS: 11/03/2020 Last Visit: 05/04/2021 Next Visit: 11/02/2021 Last Refill: 07/11/2021 #60 and 2RF   Please Advise

## 2021-10-20 NOTE — Telephone Encounter (Signed)
PDMP okay, prescription sent 

## 2021-10-26 DIAGNOSIS — I10 Essential (primary) hypertension: Secondary | ICD-10-CM | POA: Diagnosis not present

## 2021-10-26 DIAGNOSIS — R69 Illness, unspecified: Secondary | ICD-10-CM | POA: Diagnosis not present

## 2021-10-26 DIAGNOSIS — G4700 Insomnia, unspecified: Secondary | ICD-10-CM | POA: Diagnosis not present

## 2021-10-26 DIAGNOSIS — N529 Male erectile dysfunction, unspecified: Secondary | ICD-10-CM | POA: Diagnosis not present

## 2021-10-26 DIAGNOSIS — F411 Generalized anxiety disorder: Secondary | ICD-10-CM | POA: Diagnosis not present

## 2021-10-26 DIAGNOSIS — Z85820 Personal history of malignant melanoma of skin: Secondary | ICD-10-CM | POA: Diagnosis not present

## 2021-10-26 DIAGNOSIS — Z20822 Contact with and (suspected) exposure to covid-19: Secondary | ICD-10-CM | POA: Diagnosis not present

## 2021-11-02 ENCOUNTER — Encounter: Payer: Self-pay | Admitting: Internal Medicine

## 2021-11-02 ENCOUNTER — Ambulatory Visit (INDEPENDENT_AMBULATORY_CARE_PROVIDER_SITE_OTHER): Payer: Medicare HMO | Admitting: Internal Medicine

## 2021-11-02 VITALS — BP 126/80 | HR 66 | Temp 97.9°F | Resp 16 | Ht 72.0 in | Wt 174.0 lb

## 2021-11-02 DIAGNOSIS — D698 Other specified hemorrhagic conditions: Secondary | ICD-10-CM | POA: Diagnosis not present

## 2021-11-02 DIAGNOSIS — Z79899 Other long term (current) drug therapy: Secondary | ICD-10-CM

## 2021-11-02 DIAGNOSIS — R69 Illness, unspecified: Secondary | ICD-10-CM | POA: Diagnosis not present

## 2021-11-02 DIAGNOSIS — G4700 Insomnia, unspecified: Secondary | ICD-10-CM

## 2021-11-02 DIAGNOSIS — I10 Essential (primary) hypertension: Secondary | ICD-10-CM | POA: Diagnosis not present

## 2021-11-02 DIAGNOSIS — F411 Generalized anxiety disorder: Secondary | ICD-10-CM | POA: Diagnosis not present

## 2021-11-02 LAB — CBC WITH DIFFERENTIAL/PLATELET
Basophils Absolute: 0.1 10*3/uL (ref 0.0–0.1)
Basophils Relative: 0.6 % (ref 0.0–3.0)
Eosinophils Absolute: 0.4 10*3/uL (ref 0.0–0.7)
Eosinophils Relative: 3.2 % (ref 0.0–5.0)
HCT: 37.4 % — ABNORMAL LOW (ref 39.0–52.0)
Hemoglobin: 12.7 g/dL — ABNORMAL LOW (ref 13.0–17.0)
Lymphocytes Relative: 66.8 % — ABNORMAL HIGH (ref 12.0–46.0)
Lymphs Abs: 8.1 10*3/uL — ABNORMAL HIGH (ref 0.7–4.0)
MCHC: 33.9 g/dL (ref 30.0–36.0)
MCV: 95.5 fl (ref 78.0–100.0)
Monocytes Absolute: 0.5 10*3/uL (ref 0.1–1.0)
Monocytes Relative: 4.1 % (ref 3.0–12.0)
Neutro Abs: 3.1 10*3/uL (ref 1.4–7.7)
Neutrophils Relative %: 25.3 % — ABNORMAL LOW (ref 43.0–77.0)
Platelets: 207 10*3/uL (ref 150.0–400.0)
RBC: 3.91 Mil/uL — ABNORMAL LOW (ref 4.22–5.81)
RDW: 13.3 % (ref 11.5–15.5)
WBC: 12.1 10*3/uL — ABNORMAL HIGH (ref 4.0–10.5)

## 2021-11-02 LAB — BASIC METABOLIC PANEL
BUN: 19 mg/dL (ref 6–23)
CO2: 30 mEq/L (ref 19–32)
Calcium: 9.4 mg/dL (ref 8.4–10.5)
Chloride: 100 mEq/L (ref 96–112)
Creatinine, Ser: 0.9 mg/dL (ref 0.40–1.50)
GFR: 85.29 mL/min (ref >60.00)
Glucose, Bld: 100 mg/dL — ABNORMAL HIGH (ref 70–99)
Potassium: 4.3 mEq/L (ref 3.5–5.1)
Sodium: 136 mEq/L (ref 135–145)

## 2021-11-02 NOTE — Patient Instructions (Addendum)
Check the  blood pressure regularly BP GOAL is between 110/65 and  135/85. 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    GO TO THE LAB : Get the blood work     GO TO THE FRONT DESK, [place] Come back for a physical exam in 4 months

## 2021-11-02 NOTE — Progress Notes (Signed)
Subjective:    Patient ID: Bobby [name], male    DOB: Aug 19, 1951, 70 y.o.   MRN: [id number]  DOS:  11/02/2021 Type of visit - description: Follow-up  Since her last office visit, he is doing okay. Was diagnosed with "purpura" by dermatology. Reports that his skin itches, he scratches and then he developed a bruise. Denies any blood in the stools or in the urine. Symptoms are worse when he takes an aspirin.   Review of Systems See above   Past Medical History:  Diagnosis Date   Anxiety and depression    Hypertension    Hyponatremia 03-2013   ongoing, seeing Dr. Posey Pronto   Insomnia    Melanoma in situ of back [facility])    biopsy on 03/05/2020   SIADH (syndrome of inappropriate ADH production) ([place]) 2017   Dr. Posey Pronto   Squamous cell carcinoma in situ     Past Surgical History:  Procedure Laterality Date   MOHS SURGERY  ~ 04/2020   face, [facility]   SHOULDER SURGERY  1980s   right shoulder    Allergies as of 11/02/2021       Reactions   Tramadol    REACTION: hives        Medication List        Accurate as of November 02, 2021 10:50 AM. If you have any questions, ask your nurse or doctor.          carvedilol 6.25 MG tablet Commonly known as: COREG TAKE ONE TABLET BY MOUTH TWICE A DAY WITH A MEAL   cholecalciferol 25 MCG (1000 UNIT) tablet Commonly known as: VITAMIN D3 Take 1,000 Units by mouth daily.   clonazePAM 0.5 MG tablet Commonly known as: KLONOPIN TAKE ONE TABLET BY MOUTH TWICE A DAY AS NEEDED FOR ANXIETY   folic acid 1 MG tablet Commonly known as: FOLVITE Take 1 tablet (1 mg total) by mouth daily.   Jublia 10 % Soln Generic drug: Efinaconazole SMARTSIG:Sparingly Topical Daily   Kerydin 5 % Soln Generic drug: Tavaborole Apply topically daily.   levocetirizine 5 MG tablet Commonly known as: XYZAL Take 1 tablet (5 mg total) by mouth daily as needed for allergies.   multivitamin with minerals Tabs tablet Take 1 tablet by mouth daily.    olopatadine 0.1 % ophthalmic solution Commonly known as: PATANOL Place 1 drop into both eyes 2 (two) times daily.   tadalafil 10 MG tablet Commonly known as: CIALIS TAKE 1-2 TABLETS (10-20 MG TOTAL) BY MOUTH EVERY OTHER DAY AS NEEDED FOR ERECTILE DYSFUNCTION.   thiamine 100 MG tablet Take 1 tablet (100 mg total) by mouth daily.   zolpidem 10 MG tablet Commonly known as: AMBIEN Take 1 tablet (10 mg total) by mouth at bedtime as needed for sleep.           Objective:   Physical Exam BP 126/80 (BP Location: Left Arm, Patient Position: Sitting, Cuff Size: Small)    Pulse 66    Temp 97.9 F (36.6 C) (Oral)    Resp 16    Ht 6' (1.829 m)    Wt 174 lb (78.9 kg)    SpO2 98%    BMI 23.60 kg/m  General:   Well developed, NAD, BMI noted. HEENT:  Normocephalic . Face symmetric, atraumatic Lungs:  CTA B Normal respiratory effort, no intercostal retractions, no accessory muscle use. Heart: RRR,  no murmur.  Lower extremities: no pretibial edema bilaterally  Skin: See picture, some superficial bruises on the  arm. Neurologic:  alert & oriented X3.  Speech normal, gait appropriate for age and unassisted Psych--  Cognition and judgment appear intact.  Cooperative with normal attention span and concentration.  Behavior appropriate. No anxious or depressed appearing.       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 MCI per neuro visit 12/2017 Hyponatremia onset 2014, admitted. 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 Hyponatremia: Check BMP Anxiety, depression, insomnia: Currently on clonazepam  twice daily, Ambien 10 mg: Half tablet most nights, sometimes get an extra half tablet. Check UDS Capillary fragility: Was told by dermatology he has "purpura", symptoms are more consistent with easy bruising/capillary fragility.  We will check a CBC Preventive care: s/p COVID booster x 5 rec. RTC CPX 4 months   This visit occurred during the SARS-CoV-2 public health emergency.  Safety protocols were in place, including screening questions prior to the visit, additional usage of staff PPE, and extensive cleaning of exam room while observing appropriate contact time as indicated for disinfecting solutions.

## 2021-11-03 NOTE — Assessment & Plan Note (Signed)
HTN: Ambulatory BPs are excellent, similar to today's reading, continue carvedilol. Hyponatremia: Check BMP Anxiety, depression, insomnia: Currently on clonazepam twice daily, Ambien 10 mg: Half tablet most nights, sometimes get an extra half tablet. Check UDS Capillary fragility: Was told by dermatology he has "purpura", symptoms are more consistent with easy bruising/capillary fragility.  We will check a CBC Preventive care: s/p COVID booster x 5 rec. RTC CPX 4 months

## 2021-11-04 LAB — DRUG MONITORING PANEL 375977 , URINE
Alcohol Metabolites: POSITIVE ng/mL — AB (ref <500)
Alphahydroxyalprazolam: NEGATIVE ng/mL (ref <25)
Alphahydroxymidazolam: NEGATIVE ng/mL (ref <50)
Alphahydroxytriazolam: NEGATIVE ng/mL (ref <50)
Aminoclonazepam: 277 ng/mL — ABNORMAL HIGH (ref <25)
Amphetamines: NEGATIVE ng/mL (ref <500)
Barbiturates: NEGATIVE ng/mL (ref <300)
Benzodiazepines: POSITIVE ng/mL — AB (ref <100)
Cocaine Metabolite: NEGATIVE ng/mL (ref <150)
Desmethyltramadol: NEGATIVE ng/mL (ref <100)
Ethyl Glucuronide (ETG): 2409 ng/mL — ABNORMAL HIGH (ref <500)
Ethyl Sulfate (ETS): 665 ng/mL — ABNORMAL HIGH (ref <100)
Hydroxyethylflurazepam: NEGATIVE ng/mL (ref <50)
Lorazepam: NEGATIVE ng/mL (ref <50)
Marijuana Metabolite: NEGATIVE ng/mL (ref <20)
Nordiazepam: NEGATIVE ng/mL (ref <50)
Opiates: NEGATIVE ng/mL (ref <100)
Oxazepam: NEGATIVE ng/mL (ref <50)
Oxycodone: NEGATIVE ng/mL (ref <100)
Temazepam: NEGATIVE ng/mL (ref <50)
Tramadol: NEGATIVE ng/mL (ref <100)

## 2021-11-04 LAB — DM TEMPLATE

## 2021-11-06 ENCOUNTER — Telehealth: Payer: Self-pay | Admitting: Internal Medicine

## 2021-11-07 NOTE — Telephone Encounter (Signed)
Requesting: Ambien 10mg   Contract: 11/03/2020 UDS: 11/02/2021 Last Visit: 11/02/2021 Next Visit: 02/15/2022 Last Refill: 08/10/2021 #90 and 0RF  Please Advise

## 2021-11-09 NOTE — Telephone Encounter (Signed)
PDMP okay, Rx sent 

## 2022-01-04 DIAGNOSIS — Z79899 Other long term (current) drug therapy: Secondary | ICD-10-CM | POA: Diagnosis not present

## 2022-01-04 DIAGNOSIS — L821 Other seborrheic keratosis: Secondary | ICD-10-CM | POA: Diagnosis not present

## 2022-01-04 DIAGNOSIS — L02821 Furuncle of head [any part, except face]: Secondary | ICD-10-CM | POA: Diagnosis not present

## 2022-01-04 DIAGNOSIS — Z08 Encounter for follow-up examination after completed treatment for malignant neoplasm: Secondary | ICD-10-CM | POA: Diagnosis not present

## 2022-01-04 DIAGNOSIS — L2089 Other atopic dermatitis: Secondary | ICD-10-CM | POA: Diagnosis not present

## 2022-01-04 DIAGNOSIS — Z85820 Personal history of malignant melanoma of skin: Secondary | ICD-10-CM | POA: Diagnosis not present

## 2022-01-04 DIAGNOSIS — L814 Other melanin hyperpigmentation: Secondary | ICD-10-CM | POA: Diagnosis not present

## 2022-01-04 DIAGNOSIS — D225 Melanocytic nevi of trunk: Secondary | ICD-10-CM | POA: Diagnosis not present

## 2022-01-04 DIAGNOSIS — Z85828 Personal history of other malignant neoplasm of skin: Secondary | ICD-10-CM | POA: Diagnosis not present

## 2022-01-04 DIAGNOSIS — L02222 Furuncle of back [any part, except buttock]: Secondary | ICD-10-CM | POA: Diagnosis not present

## 2022-01-04 DIAGNOSIS — Z86007 Personal history of in-situ neoplasm of skin: Secondary | ICD-10-CM | POA: Diagnosis not present

## 2022-01-09 ENCOUNTER — Other Ambulatory Visit: Payer: Self-pay | Admitting: Internal Medicine

## 2022-01-18 DIAGNOSIS — L2089 Other atopic dermatitis: Secondary | ICD-10-CM | POA: Diagnosis not present

## 2022-01-23 ENCOUNTER — Telehealth: Payer: Self-pay | Admitting: Internal Medicine

## 2022-01-23 NOTE — Telephone Encounter (Signed)
PDMP okay, Rx sent 

## 2022-01-23 NOTE — Telephone Encounter (Signed)
Requesting: clonazepam 0.'5mg'$  ?Contract: 11/03/2020 ?UDS: 11/02/2021 ?Last Visit: 11/02/2021 ?Next Visit: 02/15/2022 ?Last Refill: 10/20/2021 #60 and 2RF ? ?Please Advise ? ?

## 2022-02-01 DIAGNOSIS — Z79899 Other long term (current) drug therapy: Secondary | ICD-10-CM | POA: Diagnosis not present

## 2022-02-08 ENCOUNTER — Telehealth: Payer: Self-pay | Admitting: Internal Medicine

## 2022-02-08 NOTE — Telephone Encounter (Signed)
Refills were sent on 11/09/2021 #90 and 1RF. Will send mychart message.  ?

## 2022-02-08 NOTE — Telephone Encounter (Signed)
Medication: zolpidem (AMBIEN) 10 MG tablet  ? ?Has the patient contacted their pharmacy? No. ? ?Preferred Pharmacy (with phone number or street name): [id number] - [address], [place] [address]  ?Phone:  [phone]  Fax:  [phone]  ? ?Agent: Please be advised that RX refills may take up to 3 business days. We ask that you follow-up with your pharmacy.  ?

## 2022-02-15 ENCOUNTER — Ambulatory Visit (INDEPENDENT_AMBULATORY_CARE_PROVIDER_SITE_OTHER): Payer: Medicare HMO | Admitting: Internal Medicine

## 2022-02-15 ENCOUNTER — Encounter: Payer: Self-pay | Admitting: Internal Medicine

## 2022-02-15 ENCOUNTER — Other Ambulatory Visit: Payer: Medicare HMO

## 2022-02-15 VITALS — BP 136/82 | HR 60 | Temp 98.1°F | Resp 16 | Ht 72.0 in | Wt 173.4 lb

## 2022-02-15 DIAGNOSIS — D72820 Lymphocytosis (symptomatic): Secondary | ICD-10-CM | POA: Diagnosis not present

## 2022-02-15 DIAGNOSIS — R7989 Other specified abnormal findings of blood chemistry: Secondary | ICD-10-CM

## 2022-02-15 DIAGNOSIS — Z0000 Encounter for general adult medical examination without abnormal findings: Secondary | ICD-10-CM | POA: Diagnosis not present

## 2022-02-15 DIAGNOSIS — F411 Generalized anxiety disorder: Secondary | ICD-10-CM | POA: Diagnosis not present

## 2022-02-15 DIAGNOSIS — I10 Essential (primary) hypertension: Secondary | ICD-10-CM | POA: Diagnosis not present

## 2022-02-15 DIAGNOSIS — D649 Anemia, unspecified: Secondary | ICD-10-CM

## 2022-02-15 DIAGNOSIS — R69 Illness, unspecified: Secondary | ICD-10-CM | POA: Diagnosis not present

## 2022-02-15 LAB — CBC WITH DIFFERENTIAL/PLATELET
Basophils Absolute: 0.1 10*3/uL (ref 0.0–0.1)
Basophils Relative: 0.6 % (ref 0.0–3.0)
Eosinophils Absolute: 0.4 10*3/uL (ref 0.0–0.7)
Eosinophils Relative: 2.8 % (ref 0.0–5.0)
HCT: 39 % (ref 39.0–52.0)
Hemoglobin: 13.2 g/dL (ref 13.0–17.0)
Lymphocytes Relative: 63 % — ABNORMAL HIGH (ref 12.0–46.0)
Lymphs Abs: 9.7 10*3/uL — ABNORMAL HIGH (ref 0.7–4.0)
MCHC: 33.8 g/dL (ref 30.0–36.0)
MCV: 94.8 fl (ref 78.0–100.0)
Monocytes Absolute: 0.8 10*3/uL (ref 0.1–1.0)
Monocytes Relative: 5 % (ref 3.0–12.0)
Neutro Abs: 4.4 10*3/uL (ref 1.4–7.7)
Neutrophils Relative %: 28.6 % — ABNORMAL LOW (ref 43.0–77.0)
Platelets: 236 10*3/uL (ref 150.0–400.0)
RBC: 4.11 Mil/uL — ABNORMAL LOW (ref 4.22–5.81)
RDW: 13.2 % (ref 11.5–15.5)
WBC: 15.4 10*3/uL — ABNORMAL HIGH (ref 4.0–10.5)

## 2022-02-15 LAB — COMPREHENSIVE METABOLIC PANEL
ALT: 19 U/L (ref 0–53)
AST: 23 U/L (ref 0–37)
Albumin: 4.4 g/dL (ref 3.5–5.2)
Alkaline Phosphatase: 54 U/L (ref 39–117)
BUN: 20 mg/dL (ref 6–23)
CO2: 29 mEq/L (ref 19–32)
Calcium: 9.3 mg/dL (ref 8.4–10.5)
Chloride: 99 mEq/L (ref 96–112)
Creatinine, Ser: 0.96 mg/dL (ref 0.40–1.50)
GFR: 78.78 mL/min (ref >60.00)
Glucose, Bld: 98 mg/dL (ref 70–99)
Potassium: 4.3 mEq/L (ref 3.5–5.1)
Sodium: 134 mEq/L — ABNORMAL LOW (ref 135–145)
Total Bilirubin: 0.8 mg/dL (ref 0.2–1.2)
Total Protein: 6.5 g/dL (ref 6.0–8.3)

## 2022-02-15 LAB — LIPID PANEL
Cholesterol: 163 mg/dL (ref 0–200)
HDL: 55.2 mg/dL (ref >39.00)
LDL Cholesterol: 88 mg/dL (ref 0–99)
NonHDL: 107.64
Total CHOL/HDL Ratio: 3
Triglycerides: 99 mg/dL (ref 0.0–149.0)
VLDL: 19.8 mg/dL (ref 0.0–40.0)

## 2022-02-15 LAB — PSA: PSA: 1.82 ng/mL (ref 0.10–4.00)

## 2022-02-15 LAB — IBC + FERRITIN
Ferritin: 39.2 ng/mL (ref 22.0–322.0)
Iron: 77 ug/dL (ref 42–165)
Saturation Ratios: 21.2 % (ref 20.0–50.0)
TIBC: 362.6 ug/dL (ref 250.0–450.0)
Transferrin: 259 mg/dL (ref 212.0–360.0)

## 2022-02-15 NOTE — Progress Notes (Signed)
? ?Subjective:  ? ? Patient ID: Bobby [name], male    DOB: 1951/10/29, 71 y.o.   MRN: [id number] ? ?DOS:  02/15/2022 ?Type of visit - description: cpx ? ?Here for CPX, has no major concerns. ?Feeling well physically and emotionally. ? ?Wt Readings from Last 3 Encounters:  ?02/15/22 173 lb 6 oz (78.6 kg)  ?11/02/21 174 lb (78.9 kg)  ?08/10/21 173 lb (78.5 kg)  ? ? ?Review of Systems ? ? ?A 14 point review of systems is negative  ? ? ?Past Medical History:  ?Diagnosis Date  ? Anxiety and depression   ? Hypertension   ? Hyponatremia 03-2013  ? ongoing, seeing Dr. Posey Pronto  ? Insomnia   ? Melanoma in situ of back [facility])   ? biopsy on 03/05/2020  ? SIADH (syndrome of inappropriate ADH production) ([place]) 2017  ? Dr. Posey Pronto  ? Squamous cell carcinoma in situ   ? ? ?Past Surgical History:  ?Procedure Laterality Date  ? MOHS SURGERY  ~ 2020/05/18  ? face, SCC  ? SHOULDER SURGERY  1980s  ? right shoulder  ? ?Social History  ? ?Socioeconomic History  ? Marital status: Married  ?  Spouse name: Not on file  ? Number of children: 2  ? Years of education: 36  ? Highest education level: Bachelor's degree (e.g., BA, AB, BS)  ?Occupational History  ? Occupation: semi-retired--business owner   ?Tobacco Use  ? Smoking status: Never  ? Smokeless tobacco: Never  ?Vaping Use  ? Vaping Use: Never used  ?Substance and Sexual Activity  ? Alcohol use: Yes  ?  Alcohol/week: 0.0 standard drinks  ?  Comment: socially , 2 to 3 drinks  a day  ? Drug use: No  ? Sexual activity: Yes  ?Other Topics Concern  ? Not on file  ?Social History Narrative  ? [name], married   ? 2 children  ? mom passed away May 18, 2008  ? Right-handed.  ? 2-3 cups coffee per day.  ? Lives at home with wife.  ?   ?   ?   ? ?Social Determinants of Health  ? ?Financial Resource Strain: Low Risk   ? Difficulty of Paying Living Expenses: Not hard at all  ?Food Insecurity: No Food Insecurity  ? Worried About Charity fundraiser in the Last Year: Never true  ? Ran Out of Food in the Last  Year: Never true  ?Transportation Needs: No Transportation Needs  ? Lack of Transportation (Medical): No  ? Lack of Transportation (Non-Medical): No  ?Physical Activity: Insufficiently Active  ? Days of Exercise per Week: 3 days  ? Minutes of Exercise per Session: 40 min  ?Stress: No Stress Concern Present  ? Feeling of Stress : Only a little  ?Social Connections: Moderately Isolated  ? Frequency of Communication with Friends and Family: More than three times a week  ? Frequency of Social Gatherings with Friends and Family: More than three times a week  ? Attends Religious Services: Never  ? Active Member of Clubs or Organizations: No  ? Attends Archivist Meetings: Never  ? Marital Status: Married  ?Intimate Partner Violence: Not At Risk  ? Fear of Current or Ex-Partner: No  ? Emotionally Abused: No  ? Physically Abused: No  ? Sexually Abused: No  ? ? ?Current Outpatient Medications  ?Medication Instructions  ? carvedilol (COREG) 6.25 MG tablet TAKE ONE TABLET BY MOUTH TWICE A DAY WITH MEALS  ? cholecalciferol (VITAMIN D3) 1,000  Units, Oral, Daily  ? clonazePAM (KLONOPIN) 0.5 MG tablet TAKE ONE TABLET BY MOUTH TWICE A DAY AS NEEDED FOR ANXIETY  ? DUPIXENT 300 MG/2ML SOPN Subcutaneous  ? folic acid (FOLVITE) 1 mg, Oral, Daily  ? KERYDIN 5 % SOLN Topical, Daily  ? levocetirizine (XYZAL) 5 mg, Oral, Daily PRN  ? Multiple Vitamin (MULTIVITAMIN WITH MINERALS) TABS 1 tablet, Oral, Daily  ? olopatadine (PATANOL) 0.1 % ophthalmic solution 1 drop, Both Eyes, 2 times daily  ? tadalafil (CIALIS) 10-20 mg, Oral, Every 48 hours PRN  ? thiamine 100 mg, Oral, Daily  ? zolpidem (AMBIEN) 10 MG tablet TAKE ONE TABLET BY MOUTH EVERY NIGHT AT BEDTIME AS NEEDED FOR SLEEP  ? ? ?   ?Objective:  ? Physical Exam ?BP 136/82 (BP Location: Left Arm, Patient Position: Sitting, Cuff Size: Small)   Pulse 60   Temp 98.1 ?F (36.7 ?C) (Oral)   Resp 16   Ht 6' (1.829 m)   Wt 173 lb 6 oz (78.6 kg)   SpO2 95%   BMI 23.51 kg/m?   ?General: ?Well developed, NAD, BMI noted ?Neck: No  thyromegaly  ?HEENT:  ?Normocephalic . Face symmetric, atraumatic ?Lungs:  ?CTA B ?Normal respiratory effort, no intercostal retractions, no accessory muscle use. ?Heart: RRR,  no murmur.  ?Abdomen:  ?Not distended, soft, non-tender. No rebound or rigidity.   ?Lower extremities: no pretibial edema bilaterally ?DRE: Minimally enlarged prostate, nontender, nonnodular.  Sphincter normal, no stool. ?Skin: Exposed areas without rash. Not pale. Not jaundice ?Neurologic:  ?alert & oriented X3.  ?Speech normal, gait appropriate for age and unassisted ?Strength symmetric and appropriate for age.  ?Psych: ?Cognition and judgment appear intact.  ?Cooperative with normal attention span and concentration.  ?Behavior appropriate. ?No anxious or depressed appearing. ? ?   ?Assessment   ? 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 MCI per neuro visit 12/2017 ?Hyponatremia onset 2014, admitted. Dx SIADH (renal 12/2015:RX to watch fluid intake and sx -gait d/o, MS changes) ?H/o vit D def ?SCC: DX 07-2019 ?Melanoma dx 02/2020 (R upper back);  ~12-2021nose-Moh's (per pt)  ? ?PLAN:  ?Here for CPX ?HTN: On carvedilol, BP looks very good.  No change, checking labs ?Anxiety, depression, insomnia: On clonazepam and Ambien.  Well-controlled. ?Memory issues?  Patient reported he is feeling okay, he is not concerned about this at this point. ?Hyponatremia: Last sodium very good, rechecking today. ?Dermatology: Ned Grace regularly, history of melanoma, SCC and recently eczema, was Rx Dupixent which is helping significantly. ?RTC 1 year depending on results ? ? ? ?This visit occurred during the SARS-CoV-2 public health emergency.  Safety protocols were in place, including screening questions prior to the visit, additional usage of staff PPE, and  extensive cleaning of exam room while observing appropriate contact time as indicated for disinfecting solutions.  ? ?

## 2022-02-15 NOTE — Patient Instructions (Signed)
? ?  GO TO THE LAB : Get the blood work   ? ? ?[place], [place] ?Come back for a physical exam in 1 year (depending on the results) ? ? ? ?"Living will", "Health Care Power of attorney": Advanced care planning ? ?(If you already have a living will or healthcare power of attorney, please bring the copy to be scanned in your chart.) ? ?Advance care planning is a process that supports adults in  understanding and sharing their preferences regarding future medical care.  ? ?The patient's preferences are recorded in documents called Advance Directives.    ?Advanced directives are completed (and can be modified at any time) while the patient is in full mental capacity.  ? ?The documentation should be available at all times to the patient, the family and the healthcare providers.  ?Bring in a copy to be scanned in your chart is an excellent idea and is recommended  ? ?This legal documents direct treatment decision making and/or appoint a surrogate to make the decision if the patient is not capable to do so.  ? ? ?Advance directives can be documented in many types of formats,  documents have names such as:  ?Lliving will  ?Durable power of attorney for healthcare (healthcare proxy or healthcare power of attorney)  ?Combined directives  ?Physician orders for life-sustaining treatment  ?  ?More information at: ? [email or web address]  ?

## 2022-02-16 ENCOUNTER — Encounter: Payer: Self-pay | Admitting: Internal Medicine

## 2022-02-16 LAB — PATHOLOGIST SMEAR REVIEW

## 2022-02-16 NOTE — Assessment & Plan Note (Signed)
Here for CPX ?HTN: On carvedilol, BP looks very good.  No change, checking labs ?Anxiety, depression, insomnia: On clonazepam and Ambien.  Well-controlled. ?Memory issues?  Patient reported he is feeling okay, he is not concerned about this at this point. ?Hyponatremia: Last sodium very good, rechecking today. ?Dermatology: Ned Grace regularly, history of melanoma, SCC and recently eczema, was Rx Dupixent which is helping significantly. ?RTC 1 year depending on results ?

## 2022-02-16 NOTE — Assessment & Plan Note (Signed)
-  Tdap   09/2019 ?- zostavax 2014, s/p Shingrix x2 ?-  PNM 23: 19 (age ~41) ?- Prevnar: 2016  ?-COVID vaccine: utd ?-CCS: Dr. Watt Climes ?Cscope ~ 2007,Cscope 08-2009; Tubular adenoma per bx report; colonoscopy 09-2015, no polyps. Cscope 05-17-21 5 years per path report ?- prostate ca screening:  DRE today normal, check a PSA ?-Diet and exercise: Encouraged to continue eating healthy and exercise routinely ?- Labs:CMP, FLP, CBC, iron, ferritin, PSA ?- EtOH: States he is trying to limit drinks to 2 servings at night.  I agree. ?- ACP discussed ?

## 2022-02-17 ENCOUNTER — Encounter: Payer: Self-pay | Admitting: Internal Medicine

## 2022-02-17 NOTE — Addendum Note (Signed)
Addended byDamita Dunnings D on: 02/17/2022 02:35 PM ? ? Modules accepted: Orders ? ?

## 2022-02-24 ENCOUNTER — Inpatient Hospital Stay: Payer: Medicare HMO | Admitting: Hematology & Oncology

## 2022-02-24 ENCOUNTER — Inpatient Hospital Stay: Payer: Medicare HMO | Attending: Hematology & Oncology

## 2022-02-24 ENCOUNTER — Encounter: Payer: Self-pay | Admitting: Hematology & Oncology

## 2022-02-24 ENCOUNTER — Other Ambulatory Visit: Payer: Self-pay

## 2022-02-24 VITALS — BP 156/83 | HR 54 | Temp 97.4°F | Resp 18 | Ht 72.0 in | Wt 173.0 lb

## 2022-02-24 DIAGNOSIS — D72820 Lymphocytosis (symptomatic): Secondary | ICD-10-CM | POA: Insufficient documentation

## 2022-02-24 LAB — CMP (CANCER CENTER ONLY)
ALT: 14 U/L (ref 0–44)
AST: 20 U/L (ref 15–41)
Albumin: 4.3 g/dL (ref 3.5–5.0)
Alkaline Phosphatase: 49 U/L (ref 38–126)
Anion gap: 6 (ref 5–15)
BUN: 15 mg/dL (ref 8–23)
CO2: 28 mmol/L (ref 22–32)
Calcium: 9.4 mg/dL (ref 8.9–10.3)
Chloride: 94 mmol/L — ABNORMAL LOW (ref 98–111)
Creatinine: 0.93 mg/dL (ref 0.61–1.24)
GFR, Estimated: >60 mL/min (ref >60)
Glucose, Bld: 92 mg/dL (ref 70–99)
Potassium: 4 mmol/L (ref 3.5–5.1)
Sodium: 128 mmol/L — ABNORMAL LOW (ref 135–145)
Total Bilirubin: 0.8 mg/dL (ref 0.3–1.2)
Total Protein: 6.4 g/dL — ABNORMAL LOW (ref 6.5–8.1)

## 2022-02-24 LAB — CBC WITH DIFFERENTIAL (CANCER CENTER ONLY)
Abs Immature Granulocytes: 0.05 10*3/uL (ref 0.00–0.07)
Basophils Absolute: 0.1 10*3/uL (ref 0.0–0.1)
Basophils Relative: 0 %
Eosinophils Absolute: 0.3 10*3/uL (ref 0.0–0.5)
Eosinophils Relative: 2 %
HCT: 36.1 % — ABNORMAL LOW (ref 39.0–52.0)
Hemoglobin: 12.7 g/dL — ABNORMAL LOW (ref 13.0–17.0)
Immature Granulocytes: 0 %
Lymphocytes Relative: 67 %
Lymphs Abs: 10.1 10*3/uL — ABNORMAL HIGH (ref 0.7–4.0)
MCH: 32.3 pg (ref 26.0–34.0)
MCHC: 35.2 g/dL (ref 30.0–36.0)
MCV: 91.9 fL (ref 80.0–100.0)
Monocytes Absolute: 0.7 10*3/uL (ref 0.1–1.0)
Monocytes Relative: 4 %
Neutro Abs: 4.2 10*3/uL (ref 1.7–7.7)
Neutrophils Relative %: 27 %
Platelet Count: 204 10*3/uL (ref 150–400)
RBC: 3.93 MIL/uL — ABNORMAL LOW (ref 4.22–5.81)
RDW: 12.4 % (ref 11.5–15.5)
Smear Review: NORMAL
WBC Count: 15.3 10*3/uL — ABNORMAL HIGH (ref 4.0–10.5)
nRBC: 0 % (ref 0.0–0.2)

## 2022-02-24 LAB — SAVE SMEAR(SSMR), FOR PROVIDER SLIDE REVIEW

## 2022-02-24 LAB — LACTATE DEHYDROGENASE: LDH: 123 U/L (ref 98–192)

## 2022-02-24 NOTE — Progress Notes (Signed)
Referral MD ? ?Reason for Referral: Lymphocytosis ? ?Chief Complaint  ?Patient presents with  ? New Patient (Initial Visit)  ?: My blood counts are abnormal. ? ?HPI: [name] is a really nice 71 year old white male.  He comes in with his wife.  He is originally from [place] but has lived in [place] most of his life.  He does Adult nurse estate. ? ?He is followed by Dr. Larose Kells.  He is noted to have some lymphocytosis.  Was very interesting is that he has had a problem with hyponatremia.  I suspect that he may have SIADH.  He has she has been hospitalized because of hyponatremia. ? ?Going back to 2020, his white count was 9.  Hemoglobin 13.4.  Platelet count 218,000.  He had 38% neutrophils and 52% lymphocytes. ? ?In 2021, his white cell count was 11.8.  Hemoglobin 13.  Platelet count 222,000.  White cell differential was 31 segs and 61 lymphs. ? ?In December 2022, his white cell count was 12.1.  Hemoglobin 12.7.  Platelet count 207,000.  His white cell differential showed 25 segs 67 lymphs 4 monos. ? ?He has not had any problems with swollen lymph nodes.  He has had no weight loss or weight gain.  He has had problems with eczema.  Has been on Dupixent. ? ?He has had no change in bowel or bladder habits.  He has never smoked.  He has an occasional alcoholic beverage. ? ?There is been no problem with infections. ? ?Overall, I would say his performance status is ECOG 1. ? ? ? ?Past Medical History:  ?Diagnosis Date  ? Anxiety and depression   ? Hypertension   ? Hyponatremia 03-2013  ? ongoing, seeing Dr. Posey Pronto  ? Insomnia   ? Melanoma in situ of back [facility])   ? biopsy on 03/05/2020  ? SIADH (syndrome of inappropriate ADH production) ([place]) 2017  ? Dr. Posey Pronto  ? Squamous cell carcinoma in situ   ?: ? ? ?Past Surgical History:  ?Procedure Laterality Date  ? MOHS SURGERY  ~ 04/2020  ? face, SCC  ? SHOULDER SURGERY  1980s  ? right shoulder  ?: ? ? ?Current Outpatient Medications:  ?  carvedilol (COREG) 6.25 MG  tablet, TAKE ONE TABLET BY MOUTH TWICE A DAY WITH MEALS, Disp: 180 tablet, Rfl: 1 ?  cholecalciferol (VITAMIN D3) 25 MCG (1000 UNIT) tablet, Take 1,000 Units by mouth daily., Disp: , Rfl:  ?  clonazePAM (KLONOPIN) 0.5 MG tablet, TAKE ONE TABLET BY MOUTH TWICE A DAY AS NEEDED FOR ANXIETY, Disp: 60 tablet, Rfl: 2 ?  DUPIXENT 300 MG/2ML SOPN, Inject into the skin., Disp: , Rfl:  ?  folic acid (FOLVITE) 1 MG tablet, Take 1 tablet (1 mg total) by mouth daily., Disp: 90 tablet, Rfl: 2 ?  hydrOXYzine (ATARAX) 10 MG tablet, Take 10 mg by mouth 3 (three) times daily as needed for itching., Disp: , Rfl:  ?  Multiple Vitamin (MULTIVITAMIN WITH MINERALS) TABS, Take 1 tablet by mouth daily., Disp: , Rfl:  ?  tadalafil (CIALIS) 10 MG tablet, TAKE 1-2 TABLETS (10-20 MG TOTAL) BY MOUTH EVERY OTHER DAY AS NEEDED FOR ERECTILE DYSFUNCTION., Disp: 20 tablet, Rfl: 3 ?  thiamine 100 MG tablet, Take 1 tablet (100 mg total) by mouth daily., Disp: 90 tablet, Rfl: 2 ?  triamcinolone cream (KENALOG) 0.1 %, SMARTSIG:1 Application Topical 2-3 Times Daily, Disp: , Rfl:  ?  zolpidem (AMBIEN) 10 MG tablet, TAKE ONE TABLET BY MOUTH EVERY  NIGHT AT BEDTIME AS NEEDED FOR SLEEP, Disp: 90 tablet, Rfl: 1: ? ?: ? ? ?Allergies  ?Allergen Reactions  ? Tramadol Hives  ?: ? ? ?Family History  ?Problem Relation Age of Onset  ? Hypertension Mother   ? Stroke Mother   ? Diabetes Mother   ? Emphysema Mother   ?     smoked  ? Thyroid disease Mother   ? Alcoholism Father   ? Colon cancer Neg Hx   ? Prostate cancer Neg Hx   ? CAD Neg Hx   ?: ? ? ?Social History  ? ?Socioeconomic History  ? Marital status: Married  ?  Spouse name: Not on file  ? Number of children: 2  ? Years of education: 34  ? Highest education level: Bachelor's degree (e.g., BA, AB, BS)  ?Occupational History  ? Occupation: semi-retired--business owner   ?Tobacco Use  ? Smoking status: Never  ? Smokeless tobacco: Never  ?Vaping Use  ? Vaping Use: Never used  ?Substance and Sexual Activity  ?  Alcohol use: Yes  ?  Alcohol/week: 0.0 standard drinks  ?  Comment: socially , 2 to 3 drinks  a day  ? Drug use: No  ? Sexual activity: Yes  ?Other Topics Concern  ? Not on file  ?Social History Narrative  ? [name], married   ? 2 children  ? mom passed away 05-13-2008  ? Right-handed.  ? 2-3 cups coffee per day.  ? Lives at home with wife.  ?   ?   ?   ? ?Social Determinants of Health  ? ?Financial Resource Strain: Low Risk   ? Difficulty of Paying Living Expenses: Not hard at all  ?Food Insecurity: No Food Insecurity  ? Worried About Charity fundraiser in the Last Year: Never true  ? Ran Out of Food in the Last Year: Never true  ?Transportation Needs: No Transportation Needs  ? Lack of Transportation (Medical): No  ? Lack of Transportation (Non-Medical): No  ?Physical Activity: Insufficiently Active  ? Days of Exercise per Week: 3 days  ? Minutes of Exercise per Session: 40 min  ?Stress: No Stress Concern Present  ? Feeling of Stress : Only a little  ?Social Connections: Moderately Isolated  ? Frequency of Communication with Friends and Family: More than three times a week  ? Frequency of Social Gatherings with Friends and Family: More than three times a week  ? Attends Religious Services: Never  ? Active Member of Clubs or Organizations: No  ? Attends Archivist Meetings: Never  ? Marital Status: Married  ?Intimate Partner Violence: Not At Risk  ? Fear of Current or Ex-Partner: No  ? Emotionally Abused: No  ? Physically Abused: No  ? Sexually Abused: No  ?: ? ?Review of Systems  ?Constitutional: Negative.   ?HENT: Negative.    ?Eyes: Negative.   ?Respiratory: Negative.    ?Cardiovascular: Negative.   ?Gastrointestinal: Negative.   ?Genitourinary: Negative.   ?Musculoskeletal:  Positive for back pain.  ?Skin:  Positive for rash.  ?Neurological: Negative.   ?Endo/Heme/Allergies:  Bruises/bleeds easily.  ?Psychiatric/Behavioral: Negative.    ? ? ?Exam: ?'@IPVITALS'$ @ ?Physical Exam ?Vitals reviewed.  ?HENT:  ?    Head: Normocephalic and atraumatic.  ?Eyes:  ?   Pupils: Pupils are equal, round, and reactive to light.  ?Cardiovascular:  ?   Rate and Rhythm: Normal rate and regular rhythm.  ?   Heart sounds: Normal heart sounds.  ?Pulmonary:  ?  Effort: Pulmonary effort is normal.  ?   Breath sounds: Normal breath sounds.  ?Abdominal:  ?   General: Bowel sounds are normal.  ?   Palpations: Abdomen is soft.  ?Musculoskeletal:     ?   General: No tenderness or deformity. Normal range of motion.  ?   Cervical back: Normal range of motion.  ?Lymphadenopathy:  ?   Cervical: No cervical adenopathy.  ?Skin: ?   General: Skin is warm and dry.  ?   Findings: No erythema or rash.  ?Neurological:  ?   Mental Status: He is alert and oriented to person, place, and time.  ?Psychiatric:     ?   Behavior: Behavior normal.     ?   Thought Content: Thought content normal.     ?   Judgment: Judgment normal.  ? ? ? ? ?Recent Labs  ?  02/24/22 ?1344  ?WBC 15.3*  ?HGB 12.7*  ?HCT 36.1*  ?PLT 204  ? ? ?Recent Labs  ?  02/24/22 ?1344  ?NA 128*  ?K 4.0  ?CL 94*  ?CO2 28  ?GLUCOSE 92  ?BUN 15  ?CREATININE 0.93  ?CALCIUM 9.4  ? ? ?Blood smear review: Normochromic and normocytic population of red blood cells.  There are no nucleated red blood cells.  I see no teardrop cells or schistocytes.  He has no rouleaux formation.  White blood cells show an increased number of lymphocytes.  They are somewhat large lymphocytes.  I do not see any blasts.  He has no hypersegmented polys.  Platelets are adequate number and size. ? ?Pathology: None ? ? ? ?Assessment and Plan: Mr. Celmer is a very nice 71 year old white male.  He has lymphocytosis.  I had to believe that he is going to have CLL.  The blood smear is quite suggestive of CLL. ? ?We sent off his blood for flow cytometry.  We also sent off blood for immunoglobulins. ? ?I think the flow cytometry will tell us what we have. ? ?Again I just do not think that he is going to need any kind of therapy at this  point.  I do not see anything with his blood counts that would suggest that he has aggressive disease.  I do not have to send off any type of cytogenetic studies or molecular markers at this point.  I do not thi

## 2022-02-25 LAB — BETA 2 MICROGLOBULIN, SERUM: Beta-2 Microglobulin: 2 mg/L (ref 0.6–2.4)

## 2022-02-25 LAB — IGG, IGA, IGM
IgA: 90 mg/dL (ref 61–437)
IgG (Immunoglobin G), Serum: 870 mg/dL (ref 603–1613)
IgM (Immunoglobulin M), Srm: 57 mg/dL (ref 15–143)

## 2022-02-27 ENCOUNTER — Encounter: Payer: Self-pay | Admitting: Hematology & Oncology

## 2022-02-27 LAB — SURGICAL PATHOLOGY

## 2022-02-27 LAB — KAPPA/LAMBDA LIGHT CHAINS
Kappa free light chain: 16.1 mg/L (ref 3.3–19.4)
Kappa, lambda light chain ratio: 2.33 — ABNORMAL HIGH (ref 0.26–1.65)
Lambda free light chains: 6.9 mg/L (ref 5.7–26.3)

## 2022-03-01 LAB — FLOW CYTOMETRY

## 2022-03-08 DIAGNOSIS — D485 Neoplasm of uncertain behavior of skin: Secondary | ICD-10-CM | POA: Diagnosis not present

## 2022-03-08 DIAGNOSIS — L111 Transient acantholytic dermatosis [Grover]: Secondary | ICD-10-CM | POA: Diagnosis not present

## 2022-03-08 DIAGNOSIS — I788 Other diseases of capillaries: Secondary | ICD-10-CM | POA: Diagnosis not present

## 2022-03-08 DIAGNOSIS — L820 Inflamed seborrheic keratosis: Secondary | ICD-10-CM | POA: Diagnosis not present

## 2022-03-08 DIAGNOSIS — L989 Disorder of the skin and subcutaneous tissue, unspecified: Secondary | ICD-10-CM | POA: Diagnosis not present

## 2022-03-08 DIAGNOSIS — D034 Melanoma in situ of scalp and neck: Secondary | ICD-10-CM | POA: Diagnosis not present

## 2022-03-08 DIAGNOSIS — Z85820 Personal history of malignant melanoma of skin: Secondary | ICD-10-CM | POA: Diagnosis not present

## 2022-03-08 DIAGNOSIS — Z79899 Other long term (current) drug therapy: Secondary | ICD-10-CM | POA: Diagnosis not present

## 2022-03-08 DIAGNOSIS — L2089 Other atopic dermatitis: Secondary | ICD-10-CM | POA: Diagnosis not present

## 2022-03-08 DIAGNOSIS — E871 Hypo-osmolality and hyponatremia: Secondary | ICD-10-CM | POA: Diagnosis not present

## 2022-03-08 DIAGNOSIS — E559 Vitamin D deficiency, unspecified: Secondary | ICD-10-CM | POA: Diagnosis not present

## 2022-03-08 DIAGNOSIS — L718 Other rosacea: Secondary | ICD-10-CM | POA: Diagnosis not present

## 2022-03-08 DIAGNOSIS — I10 Essential (primary) hypertension: Secondary | ICD-10-CM | POA: Diagnosis not present

## 2022-03-08 DIAGNOSIS — L298 Other pruritus: Secondary | ICD-10-CM | POA: Diagnosis not present

## 2022-03-08 DIAGNOSIS — L538 Other specified erythematous conditions: Secondary | ICD-10-CM | POA: Diagnosis not present

## 2022-03-08 DIAGNOSIS — Z85828 Personal history of other malignant neoplasm of skin: Secondary | ICD-10-CM | POA: Diagnosis not present

## 2022-03-08 LAB — BASIC METABOLIC PANEL
BUN: 17 (ref 4–21)
CO2: 28 — AB (ref 13–22)
Chloride: 97 — AB (ref 99–108)
Creatinine: 0.9 (ref 0.6–1.3)
Glucose: 96
Potassium: 4.8 mEq/L (ref 3.5–5.1)
Sodium: 133 — AB (ref 137–147)

## 2022-03-08 LAB — COMPREHENSIVE METABOLIC PANEL
Calcium: 9.1 (ref 8.7–10.7)
eGFR: 87

## 2022-03-10 DIAGNOSIS — D485 Neoplasm of uncertain behavior of skin: Secondary | ICD-10-CM | POA: Diagnosis not present

## 2022-03-14 ENCOUNTER — Encounter: Payer: Self-pay | Admitting: Internal Medicine

## 2022-03-15 ENCOUNTER — Encounter: Payer: Self-pay | Admitting: Hematology & Oncology

## 2022-04-06 DIAGNOSIS — L989 Disorder of the skin and subcutaneous tissue, unspecified: Secondary | ICD-10-CM | POA: Diagnosis not present

## 2022-04-06 DIAGNOSIS — D034 Melanoma in situ of scalp and neck: Secondary | ICD-10-CM | POA: Diagnosis not present

## 2022-04-13 ENCOUNTER — Telehealth: Payer: Self-pay | Admitting: Internal Medicine

## 2022-04-13 MED ORDER — TADALAFIL 10 MG PO TABS: 10.0000 mg | ORAL_TABLET | ORAL | 3 refills | Status: DC | PRN

## 2022-04-13 NOTE — Telephone Encounter (Signed)
Medication:  tadalafil (CIALIS) 10 MG tablet [[id number]]   [facility]: [facility]

## 2022-04-13 NOTE — Telephone Encounter (Signed)
Rx sent 

## 2022-04-24 ENCOUNTER — Telehealth: Payer: Self-pay | Admitting: Internal Medicine

## 2022-04-24 NOTE — Telephone Encounter (Signed)
PDMP okay, Rx sent 

## 2022-04-24 NOTE — Telephone Encounter (Signed)
Requesting: clonazepam 0.'5mg'$   Contract:11/03/20 UDS: 11/02/21 Last Visit: 02/08/22 Next Visit: 02/21/23 Last Refill: 01/23/22 #60 and 2RF  Please Advise

## 2022-05-11 ENCOUNTER — Telehealth: Payer: Self-pay | Admitting: Internal Medicine

## 2022-05-29 DIAGNOSIS — H04211 Epiphora due to excess lacrimation, right lacrimal gland: Secondary | ICD-10-CM | POA: Diagnosis not present

## 2022-06-14 DIAGNOSIS — D1801 Hemangioma of skin and subcutaneous tissue: Secondary | ICD-10-CM | POA: Diagnosis not present

## 2022-06-14 DIAGNOSIS — L814 Other melanin hyperpigmentation: Secondary | ICD-10-CM | POA: Diagnosis not present

## 2022-06-14 DIAGNOSIS — Z85820 Personal history of malignant melanoma of skin: Secondary | ICD-10-CM | POA: Diagnosis not present

## 2022-06-14 DIAGNOSIS — Z85828 Personal history of other malignant neoplasm of skin: Secondary | ICD-10-CM | POA: Diagnosis not present

## 2022-06-14 DIAGNOSIS — Z08 Encounter for follow-up examination after completed treatment for malignant neoplasm: Secondary | ICD-10-CM | POA: Diagnosis not present

## 2022-06-14 DIAGNOSIS — L821 Other seborrheic keratosis: Secondary | ICD-10-CM | POA: Diagnosis not present

## 2022-06-28 ENCOUNTER — Encounter: Payer: Self-pay | Admitting: Hematology & Oncology

## 2022-06-28 ENCOUNTER — Other Ambulatory Visit: Payer: Self-pay

## 2022-06-28 ENCOUNTER — Inpatient Hospital Stay: Payer: Medicare HMO | Admitting: Hematology & Oncology

## 2022-06-28 ENCOUNTER — Inpatient Hospital Stay: Payer: Medicare HMO | Attending: Hematology & Oncology

## 2022-06-28 VITALS — BP 134/73 | HR 56 | Temp 97.8°F | Resp 16 | Wt 173.0 lb

## 2022-06-28 DIAGNOSIS — D72820 Lymphocytosis (symptomatic): Secondary | ICD-10-CM | POA: Insufficient documentation

## 2022-06-28 DIAGNOSIS — R591 Generalized enlarged lymph nodes: Secondary | ICD-10-CM

## 2022-06-28 LAB — CMP (CANCER CENTER ONLY)
ALT: 17 U/L (ref 0–44)
AST: 23 U/L (ref 15–41)
Albumin: 4.6 g/dL (ref 3.5–5.0)
Alkaline Phosphatase: 57 U/L (ref 38–126)
Anion gap: 6 (ref 5–15)
BUN: 19 mg/dL (ref 8–23)
CO2: 29 mmol/L (ref 22–32)
Calcium: 9.7 mg/dL (ref 8.9–10.3)
Chloride: 100 mmol/L (ref 98–111)
Creatinine: 0.97 mg/dL (ref 0.61–1.24)
GFR, Estimated: >60 mL/min (ref >60)
Glucose, Bld: 117 mg/dL — ABNORMAL HIGH (ref 70–99)
Potassium: 5.4 mmol/L — ABNORMAL HIGH (ref 3.5–5.1)
Sodium: 135 mmol/L (ref 135–145)
Total Bilirubin: 0.6 mg/dL (ref 0.3–1.2)
Total Protein: 6.5 g/dL (ref 6.5–8.1)

## 2022-06-28 LAB — CBC WITH DIFFERENTIAL (CANCER CENTER ONLY)
Abs Immature Granulocytes: 0.03 10*3/uL (ref 0.00–0.07)
Basophils Absolute: 0.1 10*3/uL (ref 0.0–0.1)
Basophils Relative: 1 %
Eosinophils Absolute: 0.3 10*3/uL (ref 0.0–0.5)
Eosinophils Relative: 2 %
HCT: 38.4 % — ABNORMAL LOW (ref 39.0–52.0)
Hemoglobin: 13.1 g/dL (ref 13.0–17.0)
Immature Granulocytes: 0 %
Lymphocytes Relative: 70 %
Lymphs Abs: 11.2 10*3/uL — ABNORMAL HIGH (ref 0.7–4.0)
MCH: 32.1 pg (ref 26.0–34.0)
MCHC: 34.1 g/dL (ref 30.0–36.0)
MCV: 94.1 fL (ref 80.0–100.0)
Monocytes Absolute: 0.6 10*3/uL (ref 0.1–1.0)
Monocytes Relative: 4 %
Neutro Abs: 3.6 10*3/uL (ref 1.7–7.7)
Neutrophils Relative %: 23 %
Platelet Count: 211 10*3/uL (ref 150–400)
RBC: 4.08 MIL/uL — ABNORMAL LOW (ref 4.22–5.81)
RDW: 12.6 % (ref 11.5–15.5)
Smear Review: NORMAL
WBC Count: 15.9 10*3/uL — ABNORMAL HIGH (ref 4.0–10.5)
nRBC: 0 % (ref 0.0–0.2)

## 2022-06-28 LAB — LACTATE DEHYDROGENASE: LDH: 141 U/L (ref 98–192)

## 2022-06-28 LAB — SAVE SMEAR(SSMR), FOR PROVIDER SLIDE REVIEW

## 2022-06-28 NOTE — Progress Notes (Signed)
Hematology and Oncology Follow Up Visit  Bobby [name] [id number] [birth year] 71 y.o. 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  We first saw him on 02/24/2022.  At that time, we sent off for cytometry on his peripheral blood.  Did have lymphocytosis.  The peripheral blood did not show a monoclonal B-cell population.  It was CD20 positive.  However, his CD5 and CD10 negative.  The pathologist felt that this was a B-cell lymphoproliferative disorder.  Thought maybe this could be marginal zone lymphoma or splenic lymphoma.  I think would be a good idea to get a CT scan on him as a baseline.  He has had no problem with infections.  He did have an abscessed tooth.  He had a root canal for this.  This was a couple months ago.  We did see him back in April, we did do immunoglobulin studies on him.  Had normal immunoglobulin levels.  We also did an LDH and this was normal.  He has had no rashes.  He has had no change in bowel or bladder habits.  He has been no cough or shortness of breath.  Overall, I would say that his performance status is 0.  Medications:  Current Outpatient Medications:    carvedilol (COREG) 6.25 MG tablet, TAKE ONE TABLET BY MOUTH TWICE A DAY WITH MEALS, Disp: 180 tablet, Rfl: 1   cholecalciferol (VITAMIN D3) 25 MCG (1000 UNIT) tablet, Take 1,000 Units by mouth daily., Disp: , Rfl:    clonazePAM (KLONOPIN) 0.5 MG tablet, TAKE ONE TABLET BY MOUTH TWICE A DAY AS NEEDED FOR ANXIETY, Disp: 60 tablet, Rfl: 2   DUPIXENT 300 MG/2ML SOPN, Inject into the skin., Disp: , Rfl:    folic acid (FOLVITE) 1 MG tablet, Take 1 tablet (1 mg total) by mouth daily., Disp: 90 tablet, Rfl: 2   hydrOXYzine (ATARAX) 10 MG tablet, Take 10 mg by mouth 3 (three) times daily as needed for itching., Disp: , Rfl:    Multiple Vitamin (MULTIVITAMIN  WITH MINERALS) TABS, Take 1 tablet by mouth daily., Disp: , Rfl:    tadalafil (CIALIS) 10 MG tablet, Take 1-2 tablets (10-20 mg total) by mouth every other day as needed for erectile dysfunction., Disp: 20 tablet, Rfl: 3   thiamine 100 MG tablet, Take 1 tablet (100 mg total) by mouth daily., Disp: 90 tablet, Rfl: 2   triamcinolone cream (KENALOG) 0.1 %, SMARTSIG:1 Application Topical 2-3 Times Daily, Disp: , Rfl:    zolpidem (AMBIEN) 10 MG tablet, TAKE ONE TABLET BY MOUTH EVERY NIGHT AT BEDTIME AS NEEDED FOR SLEEP, Disp: 90 tablet, Rfl: 1  Allergies:  Allergies  Allergen Reactions   Tramadol Hives    Past Medical History, Surgical history, Social history, and Family History were reviewed and updated.  Review of Systems: Review of Systems  Constitutional: Negative.   HENT:  Negative.    Eyes: Negative.   Respiratory: Negative.    Cardiovascular: Negative.   Gastrointestinal: Negative.   Endocrine: Negative.   Genitourinary: Negative.    Musculoskeletal: Negative.   Skin: Negative.   Neurological: Negative.   Hematological: Negative.   Psychiatric/Behavioral: Negative.      Physical Exam:  weight is 173 lb (78.5 kg). His oral temperature is 97.8 F (36.6 C). His blood pressure is 134/73 and his pulse is 56 (abnormal). His respiration  is 16 and oxygen saturation is 96%.   Wt Readings from Last 3 Encounters:  06/28/22 173 lb (78.5 kg)  02/24/22 173 lb (78.5 kg)  02/15/22 173 lb 6 oz (78.6 kg)    Physical Exam Vitals reviewed.  HENT:     Head: Normocephalic and atraumatic.  Eyes:     Pupils: Pupils are equal, round, and reactive to light.  Cardiovascular:     Rate and Rhythm: Normal rate and regular rhythm.     Heart sounds: Normal heart sounds.  Pulmonary:     Effort: Pulmonary effort is normal.     Breath sounds: Normal breath sounds.  Abdominal:     General: Bowel sounds are normal.     Palpations: Abdomen is soft.  Musculoskeletal:        General: No tenderness  or deformity. Normal range of motion.     Cervical back: Normal range of motion.  Lymphadenopathy:     Cervical: No cervical adenopathy.  Skin:    General: Skin is warm and dry.     Findings: No erythema or rash.  Neurological:     Mental Status: He is alert and oriented to person, place, and time.  Psychiatric:        Behavior: Behavior normal.        Thought Content: Thought content normal.        Judgment: Judgment normal.      Lab Results  Component Value Date   WBC 15.9 (H) 06/28/2022   HGB 13.1 06/28/2022   HCT 38.4 (L) 06/28/2022   MCV 94.1 06/28/2022   PLT 211 06/28/2022     Chemistry      Component Value Date/Time   NA 135 06/28/2022 0906   NA 133 (A) 03/08/2022 0000   K 5.4 (H) 06/28/2022 0906   CL 100 06/28/2022 0906   CO2 29 06/28/2022 0906   BUN 19 06/28/2022 0906   BUN 17 03/08/2022 0000   CREATININE 0.97 06/28/2022 0906   GLU 96 03/08/2022 0000      Component Value Date/Time   CALCIUM 9.7 06/28/2022 0906   ALKPHOS 57 06/28/2022 0906   AST 23 06/28/2022 0906   ALT 17 06/28/2022 0906   BILITOT 0.6 06/28/2022 0906       Impression and Plan: Bobby [name] is a very nice 71 year old white male.  He has a monoclonal B-cell population of lymphocytes.  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  We will see what the CT scan shows.  I cannot palpate any adenopathy on him.  We will see if he has any adenopathy on the CT scan.  Even if he does have adenopathy, I do not see that we have to put him through any treatment.  I would probably think differently if there is any bulky adenopathy that we find, which I would think would be highly unusual.  For right now, we will plan to get him back to see [place] in about 3 or 4 months.  I would like to get him back before the Holiday season.   Volanda Napoleon, MD 8/9/20233:50 PM

## 2022-06-29 LAB — BETA 2 MICROGLOBULIN, SERUM: Beta-2 Microglobulin: 2.1 mg/L (ref 0.6–2.4)

## 2022-07-04 ENCOUNTER — Other Ambulatory Visit: Payer: Self-pay | Admitting: Internal Medicine

## 2022-07-26 ENCOUNTER — Encounter (HOSPITAL_BASED_OUTPATIENT_CLINIC_OR_DEPARTMENT_OTHER): Payer: Self-pay

## 2022-07-26 ENCOUNTER — Ambulatory Visit (HOSPITAL_BASED_OUTPATIENT_CLINIC_OR_DEPARTMENT_OTHER)
Admission: RE | Admit: 2022-07-26 | Discharge: 2022-07-26 | Disposition: A | Discharge status: Home / Self Care | Payer: Medicare HMO | Source: Ambulatory Visit | Attending: Hematology & Oncology | Admitting: Hematology & Oncology

## 2022-07-26 DIAGNOSIS — R918 Other nonspecific abnormal finding of lung field: Secondary | ICD-10-CM | POA: Diagnosis not present

## 2022-07-26 DIAGNOSIS — J90 Pleural effusion, not elsewhere classified: Secondary | ICD-10-CM | POA: Diagnosis not present

## 2022-07-26 DIAGNOSIS — S2241XA Multiple fractures of ribs, right side, initial encounter for closed fracture: Secondary | ICD-10-CM | POA: Diagnosis not present

## 2022-07-26 DIAGNOSIS — I739 Peripheral vascular disease, unspecified: Secondary | ICD-10-CM | POA: Diagnosis not present

## 2022-07-26 DIAGNOSIS — K7689 Other specified diseases of liver: Secondary | ICD-10-CM | POA: Diagnosis not present

## 2022-07-26 DIAGNOSIS — M47814 Spondylosis without myelopathy or radiculopathy, thoracic region: Secondary | ICD-10-CM | POA: Diagnosis not present

## 2022-07-26 DIAGNOSIS — R591 Generalized enlarged lymph nodes: Secondary | ICD-10-CM | POA: Insufficient documentation

## 2022-07-26 DIAGNOSIS — K5730 Diverticulosis of large intestine without perforation or abscess without bleeding: Secondary | ICD-10-CM | POA: Diagnosis not present

## 2022-07-26 DIAGNOSIS — I700 Atherosclerosis of aorta: Secondary | ICD-10-CM | POA: Diagnosis not present

## 2022-07-26 MED ORDER — IOHEXOL 300 MG/ML  SOLN
100.0000 mL | Freq: Once | INTRAMUSCULAR | Status: AC | PRN
  Administered 2022-07-26: 100 mL via INTRAVENOUS

## 2022-08-11 DIAGNOSIS — H524 Presbyopia: Secondary | ICD-10-CM | POA: Diagnosis not present

## 2022-08-11 DIAGNOSIS — H5203 Hypermetropia, bilateral: Secondary | ICD-10-CM | POA: Diagnosis not present

## 2022-08-11 DIAGNOSIS — H52223 Regular astigmatism, bilateral: Secondary | ICD-10-CM | POA: Diagnosis not present

## 2022-08-11 DIAGNOSIS — H04221 Epiphora due to insufficient drainage, right lacrimal gland: Secondary | ICD-10-CM | POA: Diagnosis not present

## 2022-08-16 ENCOUNTER — Ambulatory Visit (INDEPENDENT_AMBULATORY_CARE_PROVIDER_SITE_OTHER): Payer: Medicare HMO | Admitting: *Deleted

## 2022-08-16 ENCOUNTER — Ambulatory Visit: Payer: Medicare HMO

## 2022-08-16 DIAGNOSIS — Z0000 Encounter for general adult medical examination without abnormal findings: Secondary | ICD-10-CM

## 2022-08-16 NOTE — Progress Notes (Addendum)
Subjective:   Bobby [name] is a 71 y.o. male who presents for Medicare Annual/Subsequent preventive examination.  I connected with  Tasia Catchings on 08/16/22 by a audio enabled telemedicine application and verified that I am speaking with the correct person using two identifiers.  Patient Location: Home  Provider Location: Office/Clinic  I discussed the limitations of evaluation and management by telemedicine. The patient expressed understanding and agreed to proceed.   Review of Systems    Defer to PCP  Cardiac Risk Factors include: advanced age (>65mn, >>29women);male gender;hypertension     Objective:    There were no vitals filed for this visit. There is no height or weight on file to calculate BMI.     08/16/2022    1:48 PM 06/28/2022    9:37 AM 02/24/2022    2:00 PM 08/10/2021   10:33 AM 04/13/2013    3:58 AM  Advanced Directives  Does Patient Have a Medical Advance Directive? Yes Yes Yes Yes Patient does not have advance directive  Type of Advance Directive [place] Living will;Healthcare Power of [place]   Does patient want to make changes to medical advance directive? No - Patient declined No - Patient declined     Copy of [place]? No - copy requested   Yes - validated most recent copy scanned in chart (See row information)   Pre-existing out of facility DNR order (yellow form or pink MOST form)     No    Current Medications (verified) Outpatient Encounter Medications as of 08/16/2022  Medication Sig   carvedilol (COREG) 6.25 MG tablet TAKE ONE TABLET BY MOUTH TWICE A DAY WITH MEALS   cholecalciferol (VITAMIN D3) 25 MCG (1000 UNIT) tablet Take 1,000 Units by mouth daily.   clonazePAM (KLONOPIN) 0.5 MG tablet TAKE ONE TABLET BY MOUTH TWICE A DAY AS NEEDED FOR ANXIETY   DUPIXENT 300 MG/2ML SOPN Inject into the skin.   folic acid (FOLVITE) 1 MG tablet Take  1 tablet (1 mg total) by mouth daily.   hydrOXYzine (ATARAX) 10 MG tablet Take 10 mg by mouth 3 (three) times daily as needed for itching.   Multiple Vitamin (MULTIVITAMIN WITH MINERALS) TABS Take 1 tablet by mouth daily.   tadalafil (CIALIS) 10 MG tablet Take 1-2 tablets (10-20 mg total) by mouth every other day as needed for erectile dysfunction.   thiamine 100 MG tablet Take 1 tablet (100 mg total) by mouth daily.   triamcinolone cream (KENALOG) 0.1 % SMARTSIG:1 Application Topical 2-3 Times Daily   zolpidem (AMBIEN) 10 MG tablet TAKE ONE TABLET BY MOUTH EVERY NIGHT AT BEDTIME AS NEEDED FOR SLEEP   No facility-administered encounter medications on file as of 08/16/2022.    Allergies (verified) Tramadol   History: Past Medical History:  Diagnosis Date   Anxiety and depression    Hypertension    Hyponatremia 03-2013   ongoing, seeing Dr. PPosey Pronto  Insomnia    Melanoma in situ of back ([facility]    biopsy on 03/05/2020   SIADH (syndrome of inappropriate ADH production) ([place] 2017   Dr. PPosey Pronto  Squamous cell carcinoma in situ    Past Surgical History:  Procedure Laterality Date   [facility]  SHOULDER SURGERY  1980s   right shoulder   Family History  Problem Relation Age of Onset   Hypertension Mother  Stroke Mother    Diabetes Mother    Emphysema Mother        smoked   Thyroid disease Mother    Alcoholism Father    Colon cancer Neg Hx    Prostate cancer Neg Hx    CAD Neg Hx    Social History   Socioeconomic History   Marital status: Married    Spouse name: Not on file   Number of children: 2   Years of education: 16   Highest education level: Bachelor's degree (e.g., BA, AB, BS)  Occupational History   Occupation: semi-retired--business owner   Tobacco Use   Smoking status: Never   Smokeless tobacco: Never  Vaping Use   Vaping Use: Never used  Substance and Sexual Activity   Alcohol use: Yes    Alcohol/week: 0.0 standard drinks of  alcohol    Comment: socially , 2 to 3 drinks  a day   Drug use: No   Sexual activity: Yes  Other Topics Concern   Not on file  Social History Narrative   [name], married    2 children   mom passed away 2008/05/24   Right-handed.   2-3 cups coffee per day.   Lives at home with wife.            Social Determinants of Health   Financial Resource Strain: Low Risk  (08/10/2021)   Overall Financial Resource Strain (CARDIA)    Difficulty of Paying Living Expenses: Not hard at all  Food Insecurity: No Food Insecurity (08/10/2021)   Hunger Vital Sign    Worried About Running Out of Food in the Last Year: Never true    Ran Out of Food in the Last Year: Never true  Transportation Needs: No Transportation Needs (08/10/2021)   PRAPARE - Hydrologist (Medical): No    Lack of Transportation (Non-Medical): No  Physical Activity: Insufficiently Active (08/10/2021)   Exercise Vital Sign    Days of Exercise per Week: 3 days    Minutes of Exercise per Session: 40 min  Stress: No Stress Concern Present (08/10/2021)   [place]    Feeling of Stress : Only a little  Social Connections: Moderately Isolated (08/10/2021)   Social Connection and Isolation Panel [NHANES]    Frequency of Communication with Friends and Family: More than three times a week    Frequency of Social Gatherings with Friends and Family: More than three times a week    Attends Religious Services: Never    Marine scientist or Organizations: No    Attends Music therapist: Never    Marital Status: Married    Tobacco Counseling Counseling given: Not Answered   Clinical Intake:  Pre-visit preparation completed: Yes  Pain : No/denies pain     Diabetes: No  How often do you need to have someone help you when you read instructions, pamphlets, or other written materials from your doctor or pharmacy?: 1 -  Never  Diabetic? No  Interpreter Needed?: No  Information entered by :: Beatris Ship, [facility]   Activities of Daily Living    08/16/2022    1:52 PM  In your present state of health, do you have any difficulty performing the following activities:  Hearing? 0  Vision? 0  Difficulty concentrating or making decisions? 0  Walking or climbing stairs? 0  Dressing or bathing? 0  Doing errands, shopping? 0  Preparing Food  and eating ? N  Using the Toilet? N  In the past six months, have you accidently leaked urine? N  Do you have problems with loss of bowel control? N  Managing your Medications? N  Managing your Finances? N  Housekeeping or managing your Housekeeping? N    Patient Care Team: Colon Branch, MD as PCP - General Melvyn Novas Christena Deem, MD as Consulting Physician (Pulmonary Disease) Elmarie Shiley, MD as Consulting Physician (Nephrology) Elayne Snare, MD as Consulting Physician (Endocrinology) Clarene Essex, MD as Consulting Physician (Gastroenterology)  Indicate any recent Medical Services you may have received from other than [facility] providers in the past year (date may be approximate).     Assessment:   This is a routine wellness examination for Mika.  Hearing/Vision screen No results found.  Dietary issues and exercise activities discussed: Current Exercise Habits: Home exercise routine, Type of exercise: strength training/weights;Other - see comments (goes biking), Time (Minutes): 30, Frequency (Times/Week): 3, Weekly Exercise (Minutes/Week): 90, Intensity: Moderate, Exercise limited by: None identified   Goals Addressed   None    Depression Screen    08/16/2022    1:50 PM 11/02/2021   10:44 AM 08/10/2021   10:36 AM 05/04/2021    9:23 AM 05/12/2020   10:25 AM 09/11/2018    2:09 PM 08/21/2017    9:27 AM  PHQ 2/9 Scores  PHQ - 2 Score 0 0 0 0 0 0 0  PHQ- 9 Score     0      Fall Risk    08/16/2022    1:49 PM 11/02/2021   10:44 AM 08/10/2021   10:35 AM 05/04/2021     9:23 AM 05/12/2020   10:01 AM  Fall Risk   Falls in the past year? 0 0 0 0 0  Number falls in past yr: 0 0 0 0 0  Injury with Fall? 0 0 0 0 0  Risk for fall due to : No Fall Risks      Follow up Falls evaluation completed Falls evaluation completed Falls prevention discussed  Falls evaluation completed    [place]:  Any stairs in or around the home? Yes  If so, are there any without handrails? No  Home free of loose throw rugs in walkways, pet beds, electrical cords, etc? Yes  Adequate lighting in your home to reduce risk of falls? Yes   ASSISTIVE DEVICES UTILIZED TO PREVENT FALLS:  Life alert? No  Use of a cane, walker or w/c? No  Grab bars in the bathroom? No  Shower chair or bench in shower? No  Elevated toilet seat or a handicapped toilet? No   TIMED UP AND GO:  Was the test performed? No . Audio visit   Cognitive Function:    12/24/2017    8:00 AM  MMSE - Mini Mental State Exam  Orientation to time 5  Orientation to Place 5  Registration 3  Attention/ Calculation 5  Recall 2  Language- name 2 objects 2  Language- repeat 1  Language- follow 3 step command 3  Language- read & follow direction 1  Write a sentence 1  Copy design 1  Total score 29        08/16/2022    1:59 PM  6CIT Screen  What Year? 0 points  What month? 0 points  What time? 0 points  Count back from 20 0 points  Months in reverse 0 points  Repeat phrase 0 points  Total Score 0 points    Immunizations Immunization History  Administered Date(s) Administered   Influenza Split 08/11/2021   Influenza, High Dose Seasonal PF 09/09/2013, 11/06/2016, 08/21/2017, 10/30/2018, 10/13/2019   Influenza-Unspecified 09/16/2014, 09/10/2015, 09/03/2020   PFIZER(Purple Top)SARS-COV-2 Vaccination 12/13/2019, 01/03/2020, 09/03/2020, 03/14/2021   Pfizer Covid-19 Vaccine Bivalent Booster 40yr & up 08/17/2021   Pneumococcal Conjugate-13 05/13/2015   Pneumococcal  Polysaccharide-23 05/04/2014   Td 01/29/2009   Tdap 10/13/2019   Zoster Recombinat (Shingrix) 01/01/2017, 03/01/2017   Zoster, Live 03/17/2013    TDAP status: Up to date  Flu Vaccine status: Due, Education has been provided regarding the importance of this vaccine. Advised may receive this vaccine at local pharmacy or Health Dept. Aware to provide a copy of the vaccination record if obtained from local pharmacy or Health Dept. Verbalized acceptance and understanding.  Pneumococcal vaccine status: Up to date  Covid-19 vaccine status: Information provided on how to obtain vaccines.   Qualifies for Shingles Vaccine? Yes   Zostavax completed Yes   Shingrix Completed?: Yes  Screening Tests Health Maintenance  Topic Date Due   COVID-19 Vaccine (6 - Pfizer risk series) 10/12/2021   INFLUENZA VACCINE  06/20/2022   COLONOSCOPY (Pts 45-445yrInsurance coverage will need to be confirmed)  05/17/2026   TETANUS/TDAP  10/12/2029   Pneumonia Vaccine 6556Years old  Completed   Hepatitis C Screening  Completed   Zoster Vaccines- Shingrix  Completed   HPV VACCINES  Aged Out    Health Maintenance  Health Maintenance Due  Topic Date Due   COVID-19 Vaccine (6 - Pfizer risk series) 10/12/2021   INFLUENZA VACCINE  06/20/2022    Colorectal cancer screening: Type of screening: Colonoscopy. Completed 05/17/21. Repeat every 5 years  Lung Cancer Screening: (Low Dose CT Chest recommended if Age 73-80ears, 30 pack-year currently smoking OR have quit w/in 15years.) does not qualify.   Lung Cancer Screening Referral: N/a  Additional Screening:  Hepatitis C Screening: does qualify; Completed 08/21/17  Vision Screening: Recommended annual ophthalmology exams for early detection of glaucoma and other disorders of the eye. Is the patient up to date with their annual eye exam?  Yes  Who is the provider or what is the name of the office in which the patient attends annual eye exams? Dr. MiSabra Heckf pt  is not established with a provider, would they like to be referred to a provider to establish care? No .   Dental Screening: Recommended annual dental exams for proper oral hygiene  Community Resource Referral / Chronic Care Management: CRR required this visit?  No   CCM required this visit?  No      Plan:     I have personally reviewed and noted the following in the patient's chart:   Medical and social history Use of alcohol, tobacco or illicit drugs  Current medications and supplements including opioid prescriptions. Patient is not currently taking opioid prescriptions. Functional ability and status Nutritional status Physical activity Advanced directives List of other physicians Hospitalizations, surgeries, and ER visits in previous 12 months Vitals Screenings to include cognitive, depression, and falls Referrals and appointments  In addition, I have reviewed and discussed with patient certain preventive protocols, quality metrics, and best practice recommendations. A written personalized care plan for preventive services as well as general preventive health recommendations were provided to patient.   Due to this being a telephonic visit, the after visit summary with patients personalized plan was offered to patient via mail or my-chart. Patient would  like to access on my-chart.   Beatris Ship, [place]   08/16/2022   Nurse Notes: None  I have reviewed and agree with Health Coaches documentation.  Kathlene November, MD

## 2022-08-16 NOTE — Patient Instructions (Signed)
Bobby [name] , Thank you for taking time to come for your Medicare Wellness Visit. I appreciate your ongoing commitment to your health goals. Please review the following plan we discussed and let me know if I can assist you in the future.   These are the goals we discussed:  Goals      Patient Stated     Start bike riding & lift weights        This is a list of the screening recommended for you and due dates:  Health Maintenance  Topic Date Due   COVID-19 Vaccine (6 - Pfizer risk series) 10/12/2021   Flu Shot  06/20/2022   Colon Cancer Screening  05/17/2026   Tetanus Vaccine  10/12/2029   Pneumonia Vaccine  Completed   Hepatitis C Screening: USPSTF Recommendation to screen - Ages 18-79 yo.  Completed   Zoster (Shingles) Vaccine  Completed   HPV Vaccine  Aged Out     Next appointment: Follow up in one year for your annual wellness visit.   Preventive Care 2 Years and Older, Male Preventive care refers to lifestyle choices and visits with your health care provider that can promote health and wellness. What does preventive care include? A yearly physical exam. This is also called an annual well check. Dental exams once or twice a year. Routine eye exams. Ask your health care provider how often you should have your eyes checked. Personal lifestyle choices, including: Daily care of your teeth and gums. Regular physical activity. Eating a healthy diet. Avoiding tobacco and drug use. Limiting alcohol use. Practicing safe sex. Taking low doses of aspirin every day. Taking vitamin and mineral supplements as recommended by your health care provider. What happens during an annual well check? The services and screenings done by your health care provider during your annual well check will depend on your age, overall health, lifestyle risk factors, and family history of disease. Counseling  Your health care provider may ask you questions about your: Alcohol use. Tobacco use. Drug  use. Emotional well-being. Home and relationship well-being. Sexual activity. Eating habits. History of falls. Memory and ability to understand (cognition). Work and work Statistician. Screening  You may have the following tests or measurements: Height, weight, and BMI. Blood pressure. Lipid and cholesterol levels. These may be checked every 5 years, or more frequently if you are over 54 years old. Skin check. Lung cancer screening. You may have this screening every year starting at age 66 if you have a 30-pack-year history of smoking and currently smoke or have quit within the past 15 years. Fecal occult blood test (FOBT) of the stool. You may have this test every year starting at age 39. Flexible sigmoidoscopy or colonoscopy. You may have a sigmoidoscopy every 5 years or a colonoscopy every 10 years starting at age 57. Prostate cancer screening. Recommendations will vary depending on your family history and other risks. Hepatitis C blood test. Hepatitis B blood test. Sexually transmitted disease (STD) testing. Diabetes screening. This is done by checking your blood sugar (glucose) after you have not eaten for a while (fasting). You may have this done every 1-3 years. Abdominal aortic aneurysm (AAA) screening. You may need this if you are a current or former smoker. Osteoporosis. You may be screened starting at age 44 if you are at high risk. Talk with your health care provider about your test results, treatment options, and if necessary, the need for more tests. Vaccines  Your health care provider may recommend  certain vaccines, such as: Influenza vaccine. This is recommended every year. Tetanus, diphtheria, and acellular pertussis (Tdap, Td) vaccine. You may need a Td booster every 10 years. Zoster vaccine. You may need this after age 66. Pneumococcal 13-valent conjugate (PCV13) vaccine. One dose is recommended after age 21. Pneumococcal polysaccharide (PPSV23) vaccine. One dose is  recommended after age 7. Talk to your health care provider about which screenings and vaccines you need and how often you need them. This information is not intended to replace advice given to you by your health care provider. Make sure you discuss any questions you have with your health care provider. Document Released: 12/03/2015 Document Revised: 07/26/2016 Document Reviewed: 09/07/2015 Elsevier Interactive Patient Education  2017 [place] Prevention in the Home Falls can cause injuries. They can happen to people of all ages. There are many things you can do to make your home safe and to help prevent falls. What can I do on the outside of my home? Regularly fix the edges of walkways and driveways and fix any cracks. Remove anything that might make you trip as you walk through a door, such as a raised step or threshold. Trim any bushes or trees on the path to your home. Use bright outdoor lighting. Clear any walking paths of anything that might make someone trip, such as rocks or tools. Regularly check to see if handrails are loose or broken. Make sure that both sides of any steps have handrails. Any raised decks and porches should have guardrails on the edges. Have any leaves, snow, or ice cleared regularly. Use sand or salt on walking paths during winter. Clean up any spills in your garage right away. This includes oil or grease spills. What can I do in the bathroom? Use night lights. Install grab bars by the toilet and in the tub and shower. Do not use towel bars as grab bars. Use non-skid mats or decals in the tub or shower. If you need to sit down in the shower, use a plastic, non-slip stool. Keep the floor dry. Clean up any water that spills on the floor as soon as it happens. Remove soap buildup in the tub or shower regularly. Attach bath mats securely with double-sided non-slip rug tape. Do not have throw rugs and other things on the floor that can make you  trip. What can I do in the bedroom? Use night lights. Make sure that you have a light by your bed that is easy to reach. Do not use any sheets or blankets that are too big for your bed. They should not hang down onto the floor. Have a firm chair that has side arms. You can use this for support while you get dressed. Do not have throw rugs and other things on the floor that can make you trip. What can I do in the kitchen? Clean up any spills right away. Avoid walking on wet floors. Keep items that you use a lot in easy-to-reach places. If you need to reach something above you, use a strong step stool that has a grab bar. Keep electrical cords out of the way. Do not use floor polish or wax that makes floors slippery. If you must use wax, use non-skid floor wax. Do not have throw rugs and other things on the floor that can make you trip. What can I do with my stairs? Do not leave any items on the stairs. Make sure that there are handrails on both sides of the  stairs and use them. Fix handrails that are broken or loose. Make sure that handrails are as long as the stairways. Check any carpeting to make sure that it is firmly attached to the stairs. Fix any carpet that is loose or worn. Avoid having throw rugs at the top or bottom of the stairs. If you do have throw rugs, attach them to the floor with carpet tape. Make sure that you have a light switch at the top of the stairs and the bottom of the stairs. If you do not have them, ask someone to add them for you. What else can I do to help prevent falls? Wear shoes that: Do not have high heels. Have rubber bottoms. Are comfortable and fit you well. Are closed at the toe. Do not wear sandals. If you use a stepladder: Make sure that it is fully opened. Do not climb a closed stepladder. Make sure that both sides of the stepladder are locked into place. Ask someone to hold it for you, if possible. Clearly mark and make sure that you can  see: Any grab bars or handrails. First and last steps. Where the edge of each step is. Use tools that help you move around (mobility aids) if they are needed. These include: Canes. Walkers. Scooters. Crutches. Turn on the lights when you go into a dark area. Replace any light bulbs as soon as they burn out. Set up your furniture so you have a clear path. Avoid moving your furniture around. If any of your floors are uneven, fix them. If there are any pets around you, be aware of where they are. Review your medicines with your doctor. Some medicines can make you feel dizzy. This can increase your chance of falling. Ask your doctor what other things that you can do to help prevent falls. This information is not intended to replace advice given to you by your health care provider. Make sure you discuss any questions you have with your health care provider. Document Released: 09/02/2009 Document Revised: 04/13/2016 Document Reviewed: 12/11/2014 Elsevier Interactive Patient Education  2017 [employer].

## 2022-08-21 ENCOUNTER — Other Ambulatory Visit: Payer: Self-pay | Admitting: Hematology & Oncology

## 2022-08-21 DIAGNOSIS — E871 Hypo-osmolality and hyponatremia: Secondary | ICD-10-CM | POA: Diagnosis not present

## 2022-08-21 DIAGNOSIS — I10 Essential (primary) hypertension: Secondary | ICD-10-CM | POA: Diagnosis not present

## 2022-08-21 DIAGNOSIS — E559 Vitamin D deficiency, unspecified: Secondary | ICD-10-CM | POA: Diagnosis not present

## 2022-08-21 DIAGNOSIS — R918 Other nonspecific abnormal finding of lung field: Secondary | ICD-10-CM

## 2022-08-21 LAB — BASIC METABOLIC PANEL
BUN: 15 (ref 4–21)
CO2: 27 — AB (ref 13–22)
Chloride: 99 (ref 99–108)
Creatinine: 0.9 (ref 0.6–1.3)
Glucose: 89
Potassium: 4.8 mEq/L (ref 3.5–5.1)
Sodium: 138 (ref 137–147)

## 2022-08-21 LAB — CBC AND DIFFERENTIAL
HCT: 37 — AB (ref 41–53)
Hemoglobin: 13.3 — AB (ref 13.5–17.5)
Platelets: 226 10*3/uL (ref 150–400)
WBC: 15.8

## 2022-08-21 LAB — VITAMIN D 25 HYDROXY (VIT D DEFICIENCY, FRACTURES): Vit D, 25-Hydroxy: 40

## 2022-08-21 LAB — COMPREHENSIVE METABOLIC PANEL
Albumin: 4.6 (ref 3.5–5.0)
Calcium: 9.6 (ref 8.7–10.7)
eGFR: 91

## 2022-08-23 ENCOUNTER — Telehealth: Payer: Self-pay | Admitting: Internal Medicine

## 2022-08-24 NOTE — Telephone Encounter (Signed)
PDMP okay, Rx sent 

## 2022-08-24 NOTE — Telephone Encounter (Signed)
Requesting: clonazepam 0.'5mg'$   Contract:11/03/20 UDS:11/02/21 Last Visit: 02/15/22 Next Visit: 02/21/23 Last Refill:04/24/22#60 and 2RF   Please Advise

## 2022-08-30 DIAGNOSIS — E871 Hypo-osmolality and hyponatremia: Secondary | ICD-10-CM | POA: Diagnosis not present

## 2022-08-30 DIAGNOSIS — I10 Essential (primary) hypertension: Secondary | ICD-10-CM | POA: Diagnosis not present

## 2022-09-04 ENCOUNTER — Encounter: Payer: Self-pay | Admitting: Internal Medicine

## 2022-09-25 ENCOUNTER — Encounter (HOSPITAL_BASED_OUTPATIENT_CLINIC_OR_DEPARTMENT_OTHER): Payer: Self-pay

## 2022-09-25 ENCOUNTER — Ambulatory Visit (HOSPITAL_BASED_OUTPATIENT_CLINIC_OR_DEPARTMENT_OTHER)
Admission: RE | Admit: 2022-09-25 | Discharge: 2022-09-25 | Disposition: A | Discharge status: Home / Self Care | Payer: Medicare HMO | Source: Ambulatory Visit | Attending: Hematology & Oncology | Admitting: Hematology & Oncology

## 2022-09-25 DIAGNOSIS — R918 Other nonspecific abnormal finding of lung field: Secondary | ICD-10-CM | POA: Diagnosis not present

## 2022-09-25 DIAGNOSIS — I700 Atherosclerosis of aorta: Secondary | ICD-10-CM | POA: Diagnosis not present

## 2022-09-25 DIAGNOSIS — R911 Solitary pulmonary nodule: Secondary | ICD-10-CM | POA: Diagnosis not present

## 2022-09-25 MED ORDER — IOHEXOL 300 MG/ML  SOLN
100.0000 mL | Freq: Once | INTRAMUSCULAR | Status: AC | PRN
  Administered 2022-09-25: 75 mL via INTRAVENOUS

## 2022-09-27 ENCOUNTER — Inpatient Hospital Stay: Payer: Medicare HMO | Admitting: Hematology & Oncology

## 2022-09-27 ENCOUNTER — Other Ambulatory Visit: Payer: Self-pay

## 2022-09-27 ENCOUNTER — Encounter: Payer: Self-pay | Admitting: Hematology & Oncology

## 2022-09-27 ENCOUNTER — Inpatient Hospital Stay: Payer: Medicare HMO | Attending: Hematology & Oncology

## 2022-09-27 VITALS — BP 146/81 | HR 50 | Temp 97.5°F | Resp 18 | Ht 72.0 in | Wt 176.0 lb

## 2022-09-27 DIAGNOSIS — D72820 Lymphocytosis (symptomatic): Secondary | ICD-10-CM | POA: Diagnosis not present

## 2022-09-27 DIAGNOSIS — R918 Other nonspecific abnormal finding of lung field: Secondary | ICD-10-CM | POA: Insufficient documentation

## 2022-09-27 DIAGNOSIS — C9110 Chronic lymphocytic leukemia of B-cell type not having achieved remission: Secondary | ICD-10-CM | POA: Diagnosis not present

## 2022-09-27 DIAGNOSIS — R591 Generalized enlarged lymph nodes: Secondary | ICD-10-CM

## 2022-09-27 LAB — CBC WITH DIFFERENTIAL (CANCER CENTER ONLY)
Abs Immature Granulocytes: 0.03 10*3/uL (ref 0.00–0.07)
Basophils Absolute: 0.1 10*3/uL (ref 0.0–0.1)
Basophils Relative: 1 %
Eosinophils Absolute: 0.3 10*3/uL (ref 0.0–0.5)
Eosinophils Relative: 2 %
HCT: 38.2 % — ABNORMAL LOW (ref 39.0–52.0)
Hemoglobin: 12.8 g/dL — ABNORMAL LOW (ref 13.0–17.0)
Immature Granulocytes: 0 %
Lymphocytes Relative: 73 %
Lymphs Abs: 10.9 10*3/uL — ABNORMAL HIGH (ref 0.7–4.0)
MCH: 31.8 pg (ref 26.0–34.0)
MCHC: 33.5 g/dL (ref 30.0–36.0)
MCV: 94.8 fL (ref 80.0–100.0)
Monocytes Absolute: 0.6 10*3/uL (ref 0.1–1.0)
Monocytes Relative: 4 %
Neutro Abs: 3 10*3/uL (ref 1.7–7.7)
Neutrophils Relative %: 20 %
Platelet Count: 217 10*3/uL (ref 150–400)
RBC: 4.03 MIL/uL — ABNORMAL LOW (ref 4.22–5.81)
RDW: 12.4 % (ref 11.5–15.5)
Smear Review: NORMAL
WBC Count: 14.9 10*3/uL — ABNORMAL HIGH (ref 4.0–10.5)
nRBC: 0 % (ref 0.0–0.2)

## 2022-09-27 LAB — CMP (CANCER CENTER ONLY)
ALT: 17 U/L (ref 0–44)
AST: 22 U/L (ref 15–41)
Albumin: 4.6 g/dL (ref 3.5–5.0)
Alkaline Phosphatase: 57 U/L (ref 38–126)
Anion gap: 6 (ref 5–15)
BUN: 27 mg/dL — ABNORMAL HIGH (ref 8–23)
CO2: 30 mmol/L (ref 22–32)
Calcium: 9.6 mg/dL (ref 8.9–10.3)
Chloride: 96 mmol/L — ABNORMAL LOW (ref 98–111)
Creatinine: 1 mg/dL (ref 0.61–1.24)
GFR, Estimated: >60 mL/min (ref >60)
Glucose, Bld: 103 mg/dL — ABNORMAL HIGH (ref 70–99)
Potassium: 5 mmol/L (ref 3.5–5.1)
Sodium: 132 mmol/L — ABNORMAL LOW (ref 135–145)
Total Bilirubin: 0.8 mg/dL (ref 0.3–1.2)
Total Protein: 7.2 g/dL (ref 6.5–8.1)

## 2022-09-27 LAB — LACTATE DEHYDROGENASE: LDH: 141 U/L (ref 98–192)

## 2022-09-27 LAB — SAVE SMEAR(SSMR), FOR PROVIDER SLIDE REVIEW

## 2022-09-27 NOTE — Progress Notes (Signed)
Hematology and Oncology Follow Up Visit  Bobby [name] [id number] Jun 19, 1951 71 y.o. 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  When we last saw him back in September, we did do a CT scan on him.  The CT scan did not show any adenopathy.  However, there is some issues with his lungs.  There is some nodules and some consolidation.  We repeated his CT scan today.  Unfortunately, looks like there has been some slight progression in his lungs.  He has some areas of consolidation.  There is nothing that looks like it is malignant.  From what the radiologist says, it looks like there may be a atypical infection or may be fungus.  He is not symptomatic.  He has had no cough.  There is no shortness of breath.  Unfortunately, we will going to have to send him to Pulmonary Medicine for an evaluation.  He may need to have a bronchoscopy and biopsies are brushings.  Otherwise, he is doing nicely.  He and his wife have been very active.  They go bike riding.  He has had no fever.  He has had no swollen lymph nodes.  He has had no bleeding.  There is been no rashes.  Overall, I would say his performance status is ECOG 0.  She  Medications:  Current Outpatient Medications:    carvedilol (COREG) 6.25 MG tablet, TAKE ONE TABLET BY MOUTH TWICE A DAY WITH MEALS, Disp: 180 tablet, Rfl: 2   cholecalciferol (VITAMIN D3) 25 MCG (1000 UNIT) tablet, Take 1,000 Units by mouth daily., Disp: , Rfl:    clonazePAM (KLONOPIN) 0.5 MG tablet, TAKE ONE TABLET BY MOUTH TWICE A DAY AS NEEDED FOR ANXIETY, Disp: 60 tablet, Rfl: 2   DUPIXENT 300 MG/2ML SOPN, Inject into the skin., Disp: , Rfl:    folic acid (FOLVITE) 1 MG tablet, Take 1 tablet (1 mg total) by mouth daily., Disp: 90 tablet, Rfl: 2   hydrOXYzine (ATARAX) 10 MG tablet, Take 10 mg by mouth 3 (three) times daily as  needed for itching., Disp: , Rfl:    Multiple Vitamin (MULTIVITAMIN WITH MINERALS) TABS, Take 1 tablet by mouth daily., Disp: , Rfl:    tadalafil (CIALIS) 10 MG tablet, Take 1-2 tablets (10-20 mg total) by mouth every other day as needed for erectile dysfunction., Disp: 20 tablet, Rfl: 3   thiamine 100 MG tablet, Take 1 tablet (100 mg total) by mouth daily., Disp: 90 tablet, Rfl: 2   triamcinolone cream (KENALOG) 0.1 %, SMARTSIG:1 Application Topical 2-3 Times Daily, Disp: , Rfl:    zolpidem (AMBIEN) 10 MG tablet, TAKE ONE TABLET BY MOUTH EVERY NIGHT AT BEDTIME AS NEEDED FOR SLEEP, Disp: 90 tablet, Rfl: 1  Allergies:  Allergies  Allergen Reactions   Tramadol Hives    Past Medical History, Surgical history, Social history, and Family History were reviewed and updated.  Review of Systems: Review of Systems  Constitutional: Negative.   HENT:  Negative.    Eyes: Negative.   Respiratory: Negative.    Cardiovascular: Negative.   Gastrointestinal: Negative.   Endocrine: Negative.   Genitourinary: Negative.    Musculoskeletal: Negative.   Skin: Negative.   Neurological: Negative.   Hematological: Negative.   Psychiatric/Behavioral: Negative.      Physical Exam:  height is 6' (1.829 m) and weight is 176 lb (79.8 kg).  His oral temperature is 97.5 F (36.4 C) (abnormal). His blood pressure is 146/81 (abnormal) and his pulse is 50 (abnormal). His respiration is 18 and oxygen saturation is 100%.   Wt Readings from Last 3 Encounters:  09/27/22 176 lb (79.8 kg)  06/28/22 173 lb (78.5 kg)  02/24/22 173 lb (78.5 kg)    Physical Exam Vitals reviewed.  HENT:     Head: Normocephalic and atraumatic.  Eyes:     Pupils: Pupils are equal, round, and reactive to light.  Cardiovascular:     Rate and Rhythm: Normal rate and regular rhythm.     Heart sounds: Normal heart sounds.  Pulmonary:     Effort: Pulmonary effort is normal.     Breath sounds: Normal breath sounds.  Abdominal:      General: Bowel sounds are normal.     Palpations: Abdomen is soft.  Musculoskeletal:        General: No tenderness or deformity. Normal range of motion.     Cervical back: Normal range of motion.  Lymphadenopathy:     Cervical: No cervical adenopathy.  Skin:    General: Skin is warm and dry.     Findings: No erythema or rash.  Neurological:     Mental Status: He is alert and oriented to person, place, and time.  Psychiatric:        Behavior: Behavior normal.        Thought Content: Thought content normal.        Judgment: Judgment normal.      Lab Results  Component Value Date   WBC 14.9 (H) 09/27/2022   HGB 12.8 (L) 09/27/2022   HCT 38.2 (L) 09/27/2022   MCV 94.8 09/27/2022   PLT 217 09/27/2022     Chemistry      Component Value Date/Time   NA 132 (L) 09/27/2022 0936   NA 138 08/21/2022 0000   K 5.0 09/27/2022 0936   CL 96 (L) 09/27/2022 0936   CO2 30 09/27/2022 0936   BUN 27 (H) 09/27/2022 0936   BUN 15 08/21/2022 0000   CREATININE 1.00 09/27/2022 0936   GLU 89 08/21/2022 0000      Component Value Date/Time   CALCIUM 9.6 09/27/2022 0936   ALKPHOS 57 09/27/2022 0936   AST 22 09/27/2022 0936   ALT 17 09/27/2022 0936   BILITOT 0.8 09/27/2022 0936       Impression and Plan: Bobby [name] is a very nice 71 year old white male.  He has a monoclonal B-cell population of lymphocytes.  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  Our goal now is to get him to Pulmonary Medicine.  I think that they need to see him.  I suspect he probably can end up having a bronchoscopy.  His immunoglobulin levels are okay so I would think that his immune system should be adequate.  However, given that he likely does have an underlying lymphoproliferative disorder, he could certainly be at risk for atypical infections.  I would like to see him back myself in about 3 months or so.  By then, he would have had a thorough evaluation by Pulmonary Medicine.  I would  assume that he will have a bronchoscopy.   Volanda Napoleon, MD 11/8/202310:21 AM

## 2022-09-28 LAB — KAPPA/LAMBDA LIGHT CHAINS
Kappa free light chain: 19.8 mg/L — ABNORMAL HIGH (ref 3.3–19.4)
Kappa, lambda light chain ratio: 2 — ABNORMAL HIGH (ref 0.26–1.65)
Lambda free light chains: 9.9 mg/L (ref 5.7–26.3)

## 2022-09-29 LAB — IGG, IGA, IGM
IgA: 86 mg/dL (ref 61–437)
IgG (Immunoglobin G), Serum: 894 mg/dL (ref 603–1613)
IgM (Immunoglobulin M), Srm: 52 mg/dL (ref 15–143)

## 2022-10-05 ENCOUNTER — Telehealth: Payer: Self-pay | Admitting: Emergency Medicine

## 2022-10-05 ENCOUNTER — Other Ambulatory Visit: Payer: Self-pay | Admitting: *Deleted

## 2022-10-05 ENCOUNTER — Other Ambulatory Visit (HOSPITAL_BASED_OUTPATIENT_CLINIC_OR_DEPARTMENT_OTHER): Payer: Self-pay

## 2022-10-05 DIAGNOSIS — D72820 Lymphocytosis (symptomatic): Secondary | ICD-10-CM

## 2022-10-05 DIAGNOSIS — C9110 Chronic lymphocytic leukemia of B-cell type not having achieved remission: Secondary | ICD-10-CM

## 2022-10-05 DIAGNOSIS — R918 Other nonspecific abnormal finding of lung field: Secondary | ICD-10-CM

## 2022-10-05 DIAGNOSIS — R591 Generalized enlarged lymph nodes: Secondary | ICD-10-CM

## 2022-10-05 MED ORDER — COMIRNATY 30 MCG/0.3ML IM SUSY
PREFILLED_SYRINGE | INTRAMUSCULAR | 0 refills | Status: DC
  Filled 2022-10-05: qty 0.3, 1d supply, fill #0

## 2022-10-05 NOTE — Telephone Encounter (Signed)
[place] and spoke with Lorriane Shire about pt. Per Lorriane Shire, Dr. Marin Olp wanted to speak with Dr. Lamonte Sakai about pt. 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 She stated that it mentioned that this might be atypical infection or a fungus and a bronch with biopsy and brushings will be needed to be done.  Lorriane Shire is going to send a referral to the office so we can get pt scheduled for an appt with Dr. Lamonte Sakai.  Routing encounter to Dr. Lamonte Sakai due to Dr. Marin Olp wanting to speak with him about pt.

## 2022-10-09 NOTE — Telephone Encounter (Signed)
I have been out of office and unavailable  for last several days I reviewed the patient's CT chest.  Agree there are subtle findings that could be consistent with inflammatory or infectious process.  Would be reasonable to either repeat serial CT or pursue bronchoscopy for culture and tissue diagnosis.  Would like to have the patient seen to review the scan, decide next steps.  Please set him up with me in next available nodule slot or blocked slot.  Thank you  CC: DR Katheran Awe

## 2022-10-09 NOTE — Telephone Encounter (Signed)
Pt scheduled for 11/01/22 at 1 pm (first available). Nothing further needed at this time.

## 2022-10-11 DIAGNOSIS — Z85820 Personal history of malignant melanoma of skin: Secondary | ICD-10-CM | POA: Diagnosis not present

## 2022-10-11 DIAGNOSIS — Z79899 Other long term (current) drug therapy: Secondary | ICD-10-CM | POA: Diagnosis not present

## 2022-10-11 DIAGNOSIS — D225 Melanocytic nevi of trunk: Secondary | ICD-10-CM | POA: Diagnosis not present

## 2022-10-11 DIAGNOSIS — L2089 Other atopic dermatitis: Secondary | ICD-10-CM | POA: Diagnosis not present

## 2022-10-11 DIAGNOSIS — Z85828 Personal history of other malignant neoplasm of skin: Secondary | ICD-10-CM | POA: Diagnosis not present

## 2022-10-11 DIAGNOSIS — L814 Other melanin hyperpigmentation: Secondary | ICD-10-CM | POA: Diagnosis not present

## 2022-10-11 DIAGNOSIS — L218 Other seborrheic dermatitis: Secondary | ICD-10-CM | POA: Diagnosis not present

## 2022-10-11 DIAGNOSIS — L821 Other seborrheic keratosis: Secondary | ICD-10-CM | POA: Diagnosis not present

## 2022-10-11 DIAGNOSIS — L570 Actinic keratosis: Secondary | ICD-10-CM | POA: Diagnosis not present

## 2022-10-11 DIAGNOSIS — Z08 Encounter for follow-up examination after completed treatment for malignant neoplasm: Secondary | ICD-10-CM | POA: Diagnosis not present

## 2022-10-16 ENCOUNTER — Telehealth: Payer: Self-pay | Admitting: Emergency Medicine

## 2022-10-17 NOTE — Telephone Encounter (Signed)
Reviewed imaging w Dr Marin Olp - subtle micronodular disease, an area of RUL focal atelectasis. He is clinically well, although relative immunosuppression due to his CLL. He will probably need bronchoscopy for cx data. I will discuss w him when I see him at consult 12/13.

## 2022-11-01 ENCOUNTER — Encounter: Payer: Self-pay | Admitting: Emergency Medicine

## 2022-11-01 ENCOUNTER — Ambulatory Visit: Payer: Medicare HMO | Admitting: Emergency Medicine

## 2022-11-01 VITALS — BP 132/76 | HR 57 | Temp 97.9°F | Ht 72.0 in | Wt 178.6 lb

## 2022-11-01 DIAGNOSIS — R9389 Abnormal findings on diagnostic imaging of other specified body structures: Secondary | ICD-10-CM

## 2022-11-01 NOTE — Progress Notes (Signed)
Subjective:    Patient ID: Bobby [name], male    DOB: Aug 07, 1951, 71 y.o.   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 Marin Olp.  No evidence of hypogammaglobinemia per Dr. Antonieta Pert notes.  He also has a history of hypertension, SIADH with associated hyponatremia, melanoma in situ.  He is on Dupixent per dermatology.  He has undergone serial CT chest imaging with Dr. Marin Olp, most recently performed 09/25/2022 to follow some subtle micronodular disease and airways inflammation noted on his CT 07/26/2022.  He is here today to discuss that study. Today he reports that he feels well. He deals w intermittent low back pain for several years. No constitutional sx. No cough.   CT chest 09/25/2022 reviewed by me, shows no mediastinal or hilar adenopathy.  Scattered peribronchovascular nodularity and some nodular consolidation with an area of linear consolidation in the right lower lobe that is new.  Overall mild progression compared with his previous scan in September 2023.   Review of Systems As per HPI  Past Medical History:  Diagnosis Date   Anxiety and depression    Hypertension    Hyponatremia 03-2013   ongoing, seeing Dr. Posey Pronto   Insomnia    Melanoma in situ of back [facility])    biopsy on 03/05/2020   SIADH (syndrome of inappropriate ADH production) ([place]) 2017   Dr. Posey Pronto   Squamous cell carcinoma in situ      Family History  Problem Relation Age of Onset   Hypertension Mother    Stroke Mother    Diabetes Mother    Emphysema Mother        smoked   Thyroid disease Mother    Alcoholism Father    Colon cancer Neg Hx    Prostate cancer Neg Hx    CAD Neg Hx      Social History   Socioeconomic History   Marital status: Married    Spouse name: Not on file   Number of children: 2   Years of education: 16   Highest education level: Bachelor's degree (e.g., BA, AB, BS)   Occupational History   Occupation: semi-retired--business owner   Tobacco Use   Smoking status: Never   Smokeless tobacco: Never  Vaping Use   Vaping Use: Never used  Substance and Sexual Activity   Alcohol use: Yes    Alcohol/week: 0.0 standard drinks of alcohol    Comment: socially , 2 to 3 drinks  a day   Drug use: No   Sexual activity: Yes  Other Topics Concern   Not on file  Social History Narrative   [name], married    2 children   mom passed away 2008/05/13   Right-handed.   2-3 cups coffee per day.   Lives at home with wife.            Social Determinants of Health   Financial Resource Strain: Low Risk  (08/10/2021)   Overall Financial Resource Strain (CARDIA)    Difficulty of Paying Living Expenses: Not hard at all  Food Insecurity: No Food Insecurity (08/10/2021)   Hunger Vital Sign    Worried About Running Out of Food in the Last Year: Never true    Ran Out of Food in the Last Year: Never true  Transportation Needs: No Transportation Needs (08/10/2021)   PRAPARE - Transportation    Lack of Transportation (Medical): No    Lack of  Transportation (Non-Medical): No  Physical Activity: Insufficiently Active (08/10/2021)   Exercise Vital Sign    Days of Exercise per Week: 3 days    Minutes of Exercise per Session: 40 min  Stress: No Stress Concern Present (08/10/2021)   [place]    Feeling of Stress : Only a little  Social Connections: Moderately Isolated (08/10/2021)   Social Connection and Isolation Panel [NHANES]    Frequency of Communication with Friends and Family: More than three times a week    Frequency of Social Gatherings with Friends and Family: More than three times a week    Attends Religious Services: Never    Marine scientist or Organizations: No    Attends Archivist Meetings: Never    Marital Status: Married  Human resources officer Violence: Not At Risk (08/10/2021)    Humiliation, Afraid, Rape, and Kick questionnaire    Fear of Current or Ex-Partner: No    Emotionally Abused: No    Physically Abused: No    Sexually Abused: No     Allergies  Allergen Reactions   Tramadol Hives     Outpatient Medications Prior to Visit  Medication Sig Dispense Refill   carvedilol (COREG) 6.25 MG tablet TAKE ONE TABLET BY MOUTH TWICE A DAY WITH MEALS 180 tablet 2   cholecalciferol (VITAMIN D3) 25 MCG (1000 UNIT) tablet Take 1,000 Units by mouth daily.     clonazePAM (KLONOPIN) 0.5 MG tablet TAKE ONE TABLET BY MOUTH TWICE A DAY AS NEEDED FOR ANXIETY 60 tablet 2   COVID-19 mRNA vaccine 2023-2024 (COMIRNATY) syringe Inject into the muscle. 0.3 mL 0   DUPIXENT 300 MG/2ML SOPN Inject into the skin.     folic acid (FOLVITE) 1 MG tablet Take 1 tablet (1 mg total) by mouth daily. 90 tablet 2   hydrOXYzine (ATARAX) 10 MG tablet Take 10 mg by mouth 3 (three) times daily as needed for itching.     Multiple Vitamin (MULTIVITAMIN WITH MINERALS) TABS Take 1 tablet by mouth daily.     tadalafil (CIALIS) 10 MG tablet Take 1-2 tablets (10-20 mg total) by mouth every other day as needed for erectile dysfunction. 20 tablet 3   thiamine 100 MG tablet Take 1 tablet (100 mg total) by mouth daily. 90 tablet 2   triamcinolone cream (KENALOG) 0.1 % SMARTSIG:1 Application Topical 2-3 Times Daily     zolpidem (AMBIEN) 10 MG tablet TAKE ONE TABLET BY MOUTH EVERY NIGHT AT BEDTIME AS NEEDED FOR SLEEP 90 tablet 1   No facility-administered medications prior to visit.        Objective:   Physical Exam Vitals:   11/01/22 1307  BP: 132/76  Pulse: (!) 57  Temp: 97.9 F (36.6 C)  TempSrc: Oral  SpO2: 98%  Weight: 178 lb 9.6 oz (81 kg)  Height: 6' (1.829 m)    Gen: Pleasant, well-nourished, in no distress,  normal affect  ENT: No lesions,  mouth clear,  oropharynx clear, no postnasal drip  Neck: No JVD, no stridor  Lungs: No use of accessory muscles, no crackles or wheezing on normal  respiration, no wheeze on forced expiration  Cardiovascular: RRR, heart sounds normal, no murmur or gallops, no peripheral edema  Musculoskeletal: No deformities, no cyanosis or clubbing  Neuro: alert, awake, non focal  Skin: Warm, no lesions or rash      Assessment & Plan:   Abnormal CT of the chest Patient with right-sided peribronchovascular nodularity  and mild bronchiectatic change with a more linear area of consolidation in the right lower lobe that is new compared with September 2023.  Have to presume that he has at least relative immunosuppression given his myeloproliferative disorder (immunoglobulin and WBC have been okay).  We discussed differential diagnosis including possible opportunistic infection, noninfectious inflammatory process.  Much less likely to be related to his lymphoproliferative process but possible.  I recommended bronchoscopy with BAL for cultures, brushings, transbronchial biopsies.  He understands and agrees the rationale.  Risks and benefits discussed and he wants to proceed.  We will try to get this arranged for 11/27/2022.   Baltazar Apo, MD, PhD 11/01/2022, 4:25 PM [facility] [phone] or if no answer before 7:00PM call [phone] For any issues after 7:00PM please call eLink [phone]

## 2022-11-01 NOTE — Patient Instructions (Addendum)
We will arrange for a bronchoscopy to evaluate inflammation in the right lung.  This will be done as an outpatient under general anesthesia at [facility] endoscopy.  You will need a designated driver.  We will try to get this set up for 11/27/2022. Follow Dr. Lamonte Sakai in 1 month or next available to review your bronchoscopy results.

## 2022-11-01 NOTE — Assessment & Plan Note (Signed)
Patient with right-sided peribronchovascular nodularity and mild bronchiectatic change with a more linear area of consolidation in the right lower lobe that is new compared with September 2023.  Have to presume that he has at least relative immunosuppression given his myeloproliferative disorder (immunoglobulin and WBC have been okay).  We discussed differential diagnosis including possible opportunistic infection, noninfectious inflammatory process.  Much less likely to be related to his lymphoproliferative process but possible.  I recommended bronchoscopy with BAL for cultures, brushings, transbronchial biopsies.  He understands and agrees the rationale.  Risks and benefits discussed and he wants to proceed.  We will try to get this arranged for 11/27/2022.

## 2022-11-01 NOTE — H&P (View-Only) (Signed)
Subjective:    Patient ID: Bobby [name], male    DOB: 1951-06-28, 71 y.o.   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 Marin Olp.  No evidence of hypogammaglobinemia per Dr. Antonieta Pert notes.  He also has a history of hypertension, SIADH with associated hyponatremia, melanoma in situ.  He is on Dupixent per dermatology.  He has undergone serial CT chest imaging with Dr. Marin Olp, most recently performed 09/25/2022 to follow some subtle micronodular disease and airways inflammation noted on his CT 07/26/2022.  He is here today to discuss that study. Today he reports that he feels well. He deals w intermittent low back pain for several years. No constitutional sx. No cough.   CT chest 09/25/2022 reviewed by me, shows no mediastinal or hilar adenopathy.  Scattered peribronchovascular nodularity and some nodular consolidation with an area of linear consolidation in the right lower lobe that is new.  Overall mild progression compared with his previous scan in September 2023.   Review of Systems As per HPI  Past Medical History:  Diagnosis Date   Anxiety and depression    Hypertension    Hyponatremia 03-2013   ongoing, seeing Dr. Posey Pronto   Insomnia    Melanoma in situ of back [facility])    biopsy on 03/05/2020   SIADH (syndrome of inappropriate ADH production) ([place]) 2017   Dr. Posey Pronto   Squamous cell carcinoma in situ      Family History  Problem Relation Age of Onset   Hypertension Mother    Stroke Mother    Diabetes Mother    Emphysema Mother        smoked   Thyroid disease Mother    Alcoholism Father    Colon cancer Neg Hx    Prostate cancer Neg Hx    CAD Neg Hx      Social History   Socioeconomic History   Marital status: Married    Spouse name: Not on file   Number of children: 2   Years of education: 16   Highest education level: Bachelor's degree (e.g., BA, AB, BS)   Occupational History   Occupation: semi-retired--business owner   Tobacco Use   Smoking status: Never   Smokeless tobacco: Never  Vaping Use   Vaping Use: Never used  Substance and Sexual Activity   Alcohol use: Yes    Alcohol/week: 0.0 standard drinks of alcohol    Comment: socially , 2 to 3 drinks  a day   Drug use: No   Sexual activity: Yes  Other Topics Concern   Not on file  Social History Narrative   [name], married    2 children   mom passed away 04/30/08   Right-handed.   2-3 cups coffee per day.   Lives at home with wife.            Social Determinants of Health   Financial Resource Strain: Low Risk  (08/10/2021)   Overall Financial Resource Strain (CARDIA)    Difficulty of Paying Living Expenses: Not hard at all  Food Insecurity: No Food Insecurity (08/10/2021)   Hunger Vital Sign    Worried About Running Out of Food in the Last Year: Never true    Ran Out of Food in the Last Year: Never true  Transportation Needs: No Transportation Needs (08/10/2021)   PRAPARE - Transportation    Lack of Transportation (Medical): No    Lack of  Transportation (Non-Medical): No  Physical Activity: Insufficiently Active (08/10/2021)   Exercise Vital Sign    Days of Exercise per Week: 3 days    Minutes of Exercise per Session: 40 min  Stress: No Stress Concern Present (08/10/2021)   [employer] - Occupational Stress Questionnaire    Feeling of Stress : Only a little  Social Connections: Moderately Isolated (08/10/2021)   Social Connection and Isolation Panel [NHANES]    Frequency of Communication with Friends and Family: More than three times a week    Frequency of Social Gatherings with Friends and Family: More than three times a week    Attends Religious Services: Never    Active Member of Clubs or Organizations: No    Attends Club or Organization Meetings: Never    Marital Status: Married  Intimate Partner Violence: Not At Risk (08/10/2021)    Humiliation, Afraid, Rape, and Kick questionnaire    Fear of Current or Ex-Partner: No    Emotionally Abused: No    Physically Abused: No    Sexually Abused: No     Allergies  Allergen Reactions   Tramadol Hives     Outpatient Medications Prior to Visit  Medication Sig Dispense Refill   carvedilol (COREG) 6.25 MG tablet TAKE ONE TABLET BY MOUTH TWICE A DAY WITH MEALS 180 tablet 2   cholecalciferol (VITAMIN D3) 25 MCG (1000 UNIT) tablet Take 1,000 Units by mouth daily.     clonazePAM (KLONOPIN) 0.5 MG tablet TAKE ONE TABLET BY MOUTH TWICE A DAY AS NEEDED FOR ANXIETY 60 tablet 2   COVID-19 mRNA vaccine 2023-2024 (COMIRNATY) syringe Inject into the muscle. 0.3 mL 0   DUPIXENT 300 MG/2ML SOPN Inject into the skin.     folic acid (FOLVITE) 1 MG tablet Take 1 tablet (1 mg total) by mouth daily. 90 tablet 2   hydrOXYzine (ATARAX) 10 MG tablet Take 10 mg by mouth 3 (three) times daily as needed for itching.     Multiple Vitamin (MULTIVITAMIN WITH MINERALS) TABS Take 1 tablet by mouth daily.     tadalafil (CIALIS) 10 MG tablet Take 1-2 tablets (10-20 mg total) by mouth every other day as needed for erectile dysfunction. 20 tablet 3   thiamine 100 MG tablet Take 1 tablet (100 mg total) by mouth daily. 90 tablet 2   triamcinolone cream (KENALOG) 0.1 % SMARTSIG:1 Application Topical 2-3 Times Daily     zolpidem (AMBIEN) 10 MG tablet TAKE ONE TABLET BY MOUTH EVERY NIGHT AT BEDTIME AS NEEDED FOR SLEEP 90 tablet 1   No facility-administered medications prior to visit.        Objective:   Physical Exam Vitals:   11/01/22 1307  BP: 132/76  Pulse: (!) 57  Temp: 97.9 F (36.6 C)  TempSrc: Oral  SpO2: 98%  Weight: 178 lb 9.6 oz (81 kg)  Height: 6' (1.829 m)    Gen: Pleasant, well-nourished, in no distress,  normal affect  ENT: No lesions,  mouth clear,  oropharynx clear, no postnasal drip  Neck: No JVD, no stridor  Lungs: No use of accessory muscles, no crackles or wheezing on normal  respiration, no wheeze on forced expiration  Cardiovascular: RRR, heart sounds normal, no murmur or gallops, no peripheral edema  Musculoskeletal: No deformities, no cyanosis or clubbing  Neuro: alert, awake, non focal  Skin: Warm, no lesions or rash      Assessment & Plan:   Abnormal CT of the chest Patient with right-sided peribronchovascular nodularity   and mild bronchiectatic change with a more linear area of consolidation in the right lower lobe that is new compared with September 2023.  Have to presume that he has at least relative immunosuppression given his myeloproliferative disorder (immunoglobulin and WBC have been okay).  We discussed differential diagnosis including possible opportunistic infection, noninfectious inflammatory process.  Much less likely to be related to his lymphoproliferative process but possible.  I recommended bronchoscopy with BAL for cultures, brushings, transbronchial biopsies.  He understands and agrees the rationale.  Risks and benefits discussed and he wants to proceed.  We will try to get this arranged for 11/27/2022.   Baltazar Apo, MD, PhD 11/01/2022, 4:25 PM [facility] [phone] or if no answer before 7:00PM call [phone] For any issues after 7:00PM please call eLink [phone]

## 2022-11-08 ENCOUNTER — Telehealth: Payer: Self-pay | Admitting: Internal Medicine

## 2022-11-08 NOTE — Telephone Encounter (Signed)
Requesting: Ambien '10mg'$   Contract: 11/03/20 UDS: 11/02/21 Last Visit: 02/15/22 Next Visit: 02/21/23 Last Refill: 05/12/22 #90 and 1RF   Please Advise

## 2022-11-08 NOTE — Telephone Encounter (Signed)
PDMP okay, Rx sent 

## 2022-11-18 ENCOUNTER — Encounter: Payer: Self-pay | Admitting: Internal Medicine

## 2022-11-23 ENCOUNTER — Other Ambulatory Visit: Payer: Medicare HMO

## 2022-11-23 DIAGNOSIS — R9389 Abnormal findings on diagnostic imaging of other specified body structures: Secondary | ICD-10-CM | POA: Diagnosis not present

## 2022-11-24 ENCOUNTER — Other Ambulatory Visit: Payer: Self-pay

## 2022-11-24 ENCOUNTER — Encounter (HOSPITAL_COMMUNITY): Payer: Self-pay | Admitting: Emergency Medicine

## 2022-11-24 NOTE — Progress Notes (Signed)
PCP - Dr. Larose Kells  Cardiologist - Denies  EP- Denies  Endocrine- Denies  Pulm- Denies  Chest x-ray - 11/27/22- Day of surgery  EKG - 11/27/22- Day of surgery  Stress Test - Denies  ECHO - Denies  Cardiac Cath - Denies  AICD-na PM-na LOOP-na  Nerve Stimulator- Denies  Dialysis- Denies  Sleep Study - Denies CPAP - Denies  LABS- 11/27/22: CBC, BMP 11/23/22(E): COVID- Pending  ASA- Denies  ERAS- No  HA1C- Denies  Anesthesia- No  Pt denies having chest pain, sob, or fever during the pre-op phone call. All instructions explained to the pt, with a verbal understanding of the material. Pt also instructed to wear a mask and social distance if he goes out. The opportunity to ask questions was provided.

## 2022-11-24 NOTE — Progress Notes (Signed)
S.D.W- Instructions   Your procedure is scheduled on Mon., Jan. 8, 2024 from 7:30AM-8:30AM.  Report to [facility] Main Entrance "A" at 5:30 A.M., then check in with the Admitting office.  Call this number if you have problems the morning of surgery:  [phone]             If you experience any cold or flu symptoms such as cough, fever, chills, shortness of breath, etc. between now and your scheduled surgery, please notify us at the above         number.  Masks are now required throughout our facilities due to the increasing cases of Covid, Flu, and RSV infections.   Remember:  Do not eat after midnight on Jan. 7th    Take these medicines the morning of surgery with A SIP OF WATER:  Carvedilol (COREG)   If Needed: ClonazePAM (KLONOPIN)   As of today, STOP taking any Aspirin (unless otherwise instructed by your surgeon) Aleve, Naproxen, Ibuprofen, Motrin, Advil, Goody's, BC's, all herbal medications, fish oil, and all vitamins.          Do not wear jewelry. Do not wear lotions, powders, cologne or deodorant. Do not shave 48 hours prior to surgery.  Men may shave face and neck. Do not bring valuables to the hospital.  [facility] is not responsible for any belongings or valuables.    Do NOT Smoke (Tobacco/Vaping)  24 hours prior to your procedure  If you use a CPAP at night, you may bring your mask for your overnight stay.   Contacts, glasses, hearing aids, dentures or partials may not be worn into surgery, please bring cases for these belongings   For patients admitted to the hospital, discharge time will be determined by your treatment team.   Patients discharged the day of surgery will not be allowed to drive home, and someone needs to stay with them for 24 hours.  Special instructions:    Oral Hygiene is also important to reduce your risk of infection.  Remember - BRUSH YOUR TEETH THE MORNING OF SURGERY WITH YOUR REGULAR TOOTHPASTE  [facility]- Preparing For  Surgery  Before surgery, you can play an important role. Because skin is not sterile, your skin needs to be as free of germs as possible. You can reduce the number of germs on your skin by washing with Antibacterial Soap before surgery.     Please follow these instructions carefully.     Shower the NIGHT BEFORE SURGERY and the MORNING OF SURGERY with Antibacterial Soap.   Pat yourself dry with a CLEAN TOWEL.  Wear CLEAN PAJAMAS to bed the night before surgery  Place CLEAN SHEETS on your bed the night before your surgery  DO NOT SLEEP WITH PETS.  Day of Surgery:  Take a shower with Antibacterial soap. Wear Clean/Comfortable clothing the morning of surgery Do not apply any deodorants/lotions.   Remember to brush your teeth WITH YOUR REGULAR TOOTHPASTE.   If you test positive for Covid, or been in contact with anyone that has tested positive in the last 10 days, please notify your surgeon.  SURGICAL WAITING ROOM VISITATION Patients having surgery or a procedure may have no more than 2 support people in the waiting area - these visitors may rotate.   Children under the age of 85 must have an adult with them who is not the patient. If the patient needs to stay at the hospital during part of their recovery, the visitor guidelines for inpatient  rooms apply. Pre-op nurse will coordinate an appropriate time for 1 support person to accompany patient in pre-op.  This support person may not rotate.   Please refer to the [facility] website for the visitor guidelines for Inpatients (after your surgery is over and you are in a regular room).

## 2022-11-25 LAB — SPECIMEN STATUS REPORT

## 2022-11-25 LAB — NOVEL CORONAVIRUS, NAA: SARS-CoV-2, NAA: NOT DETECTED

## 2022-11-27 ENCOUNTER — Encounter (HOSPITAL_COMMUNITY): Payer: Self-pay | Admitting: Emergency Medicine

## 2022-11-27 ENCOUNTER — Encounter (HOSPITAL_COMMUNITY)
Admission: RE | Disposition: A | Discharge status: Home / Self Care | Payer: Self-pay | Source: Home / Self Care | Attending: Emergency Medicine

## 2022-11-27 ENCOUNTER — Ambulatory Visit (HOSPITAL_COMMUNITY): Payer: Medicare HMO

## 2022-11-27 ENCOUNTER — Other Ambulatory Visit: Payer: Self-pay

## 2022-11-27 ENCOUNTER — Ambulatory Visit (HOSPITAL_BASED_OUTPATIENT_CLINIC_OR_DEPARTMENT_OTHER): Payer: Medicare HMO | Admitting: Certified Registered Nurse Anesthetist

## 2022-11-27 ENCOUNTER — Ambulatory Visit (HOSPITAL_COMMUNITY)
Admission: RE | Admit: 2022-11-27 | Discharge: 2022-11-27 | Disposition: A | Discharge status: Home / Self Care | Payer: Medicare HMO | Attending: Emergency Medicine | Admitting: Emergency Medicine

## 2022-11-27 ENCOUNTER — Ambulatory Visit (HOSPITAL_COMMUNITY): Payer: Medicare HMO | Admitting: Certified Registered Nurse Anesthetist

## 2022-11-27 DIAGNOSIS — J189 Pneumonia, unspecified organism: Secondary | ICD-10-CM | POA: Diagnosis not present

## 2022-11-27 DIAGNOSIS — J8410 Pulmonary fibrosis, unspecified: Secondary | ICD-10-CM | POA: Diagnosis not present

## 2022-11-27 DIAGNOSIS — E222 Syndrome of inappropriate secretion of antidiuretic hormone: Secondary | ICD-10-CM | POA: Insufficient documentation

## 2022-11-27 DIAGNOSIS — J984 Other disorders of lung: Secondary | ICD-10-CM | POA: Insufficient documentation

## 2022-11-27 DIAGNOSIS — Z9889 Other specified postprocedural states: Secondary | ICD-10-CM | POA: Diagnosis not present

## 2022-11-27 DIAGNOSIS — R918 Other nonspecific abnormal finding of lung field: Secondary | ICD-10-CM | POA: Diagnosis not present

## 2022-11-27 DIAGNOSIS — R9389 Abnormal findings on diagnostic imaging of other specified body structures: Secondary | ICD-10-CM

## 2022-11-27 DIAGNOSIS — I10 Essential (primary) hypertension: Secondary | ICD-10-CM | POA: Diagnosis not present

## 2022-11-27 DIAGNOSIS — R911 Solitary pulmonary nodule: Secondary | ICD-10-CM

## 2022-11-27 HISTORY — PX: BRONCHIAL BIOPSY: SHX5109

## 2022-11-27 HISTORY — PX: VIDEO BRONCHOSCOPY: SHX5072

## 2022-11-27 HISTORY — PX: BRONCHIAL BRUSHINGS: SHX5108

## 2022-11-27 HISTORY — PX: BRONCHIAL WASHINGS: SHX5105

## 2022-11-27 LAB — BODY FLUID CELL COUNT WITH DIFFERENTIAL
Eos, Fluid: 0 %
Lymphs, Fluid: 41 %
Monocyte-Macrophage-Serous Fluid: 16 % — ABNORMAL LOW (ref 50–90)
Neutrophil Count, Fluid: 43 % — ABNORMAL HIGH (ref 0–25)
Total Nucleated Cell Count, Fluid: 181 cu mm (ref 0–1000)

## 2022-11-27 LAB — BASIC METABOLIC PANEL
Anion gap: 6 (ref 5–15)
BUN: 13 mg/dL (ref 8–23)
CO2: 28 mmol/L (ref 22–32)
Calcium: 8.5 mg/dL — ABNORMAL LOW (ref 8.9–10.3)
Chloride: 97 mmol/L — ABNORMAL LOW (ref 98–111)
Creatinine, Ser: 0.85 mg/dL (ref 0.61–1.24)
GFR, Estimated: >60 mL/min (ref >60)
Glucose, Bld: 101 mg/dL — ABNORMAL HIGH (ref 70–99)
Potassium: 3.9 mmol/L (ref 3.5–5.1)
Sodium: 131 mmol/L — ABNORMAL LOW (ref 135–145)

## 2022-11-27 LAB — CBC
HCT: 35 % — ABNORMAL LOW (ref 39.0–52.0)
Hemoglobin: 12.1 g/dL — ABNORMAL LOW (ref 13.0–17.0)
MCH: 32.6 pg (ref 26.0–34.0)
MCHC: 34.6 g/dL (ref 30.0–36.0)
MCV: 94.3 fL (ref 80.0–100.0)
Platelets: 206 10*3/uL (ref 150–400)
RBC: 3.71 MIL/uL — ABNORMAL LOW (ref 4.22–5.81)
RDW: 12.9 % (ref 11.5–15.5)
WBC: 15.9 10*3/uL — ABNORMAL HIGH (ref 4.0–10.5)
nRBC: 0 % (ref 0.0–0.2)

## 2022-11-27 SURGERY — BRONCHOSCOPY, WITH FLUOROSCOPY
Anesthesia: General

## 2022-11-27 MED ORDER — PROPOFOL 10 MG/ML IV BOLUS
INTRAVENOUS | Status: DC | PRN
  Administered 2022-11-27: 130 mg via INTRAVENOUS
  Administered 2022-11-27: 20 mg via INTRAVENOUS

## 2022-11-27 MED ORDER — CHLORHEXIDINE GLUCONATE 0.12 % MT SOLN: 15.0000 mL | Freq: Once | OROMUCOSAL | Status: AC

## 2022-11-27 MED ORDER — PROPOFOL 500 MG/50ML IV EMUL
INTRAVENOUS | Status: DC | PRN
  Administered 2022-11-27: 150 ug/kg/min via INTRAVENOUS

## 2022-11-27 MED ORDER — FENTANYL CITRATE (PF) 100 MCG/2ML IJ SOLN
INTRAMUSCULAR | Status: DC | PRN
  Administered 2022-11-27 (×2): 50 ug via INTRAVENOUS

## 2022-11-27 MED ORDER — EPHEDRINE SULFATE-NACL 50-0.9 MG/10ML-% IV SOSY
PREFILLED_SYRINGE | INTRAVENOUS | Status: DC | PRN
  Administered 2022-11-27: 15 mg via INTRAVENOUS

## 2022-11-27 MED ORDER — DEXAMETHASONE SODIUM PHOSPHATE 10 MG/ML IJ SOLN
INTRAMUSCULAR | Status: DC | PRN
  Administered 2022-11-27: 10 mg via INTRAVENOUS

## 2022-11-27 MED ORDER — LIDOCAINE 2% (20 MG/ML) 5 ML SYRINGE
INTRAMUSCULAR | Status: DC | PRN
  Administered 2022-11-27: 60 mg via INTRAVENOUS

## 2022-11-27 MED ORDER — CHLORHEXIDINE GLUCONATE 0.12 % MT SOLN
OROMUCOSAL | Status: AC
  Administered 2022-11-27: 15 mL via OROMUCOSAL
  Filled 2022-11-27: qty 15

## 2022-11-27 MED ORDER — GLYCOPYRROLATE PF 0.2 MG/ML IJ SOSY
PREFILLED_SYRINGE | INTRAMUSCULAR | Status: DC | PRN
  Administered 2022-11-27: .1 mg via INTRAVENOUS

## 2022-11-27 MED ORDER — ROCURONIUM BROMIDE 10 MG/ML (PF) SYRINGE
PREFILLED_SYRINGE | INTRAVENOUS | Status: DC | PRN
  Administered 2022-11-27: 60 mg via INTRAVENOUS

## 2022-11-27 MED ORDER — ONDANSETRON HCL 4 MG/2ML IJ SOLN
INTRAMUSCULAR | Status: DC | PRN
  Administered 2022-11-27: 4 mg via INTRAVENOUS

## 2022-11-27 MED ORDER — SUGAMMADEX SODIUM 200 MG/2ML IV SOLN
INTRAVENOUS | Status: DC | PRN
  Administered 2022-11-27: 200 mg via INTRAVENOUS

## 2022-11-27 MED ORDER — LACTATED RINGERS IV SOLN: INTRAVENOUS | Status: DC

## 2022-11-27 NOTE — Op Note (Signed)
Video Bronchoscopy Procedure Note  Date of Operation: 11/27/2022  Pre-op Diagnosis: Right upper lobe and right lower lobe micronodular pulmonary traits  Post-op Diagnosis: Same  Surgeon: Baltazar Apo  Assistants: none  Anesthesia: General anesthesia  Operation: Flexible video fiberoptic bronchoscopy and biopsies.  Estimated Blood Loss: 0 cc  Complications: none noted  Indications and History: Bobby [name] is 72 y.o. with history of monoclonal B-cell lymphocytosis followed by oncology.  He was found to have micronodular pulmonary infiltrates in the right upper lobe and right lower lobe superior segment on surveillance CT scan of the chest, most recent 09/25/2022.  Recommendation made to achieve culture data, tissue diagnoses via bronchoscopy with BAL and biopsies. The risks, benefits, complications, treatment options and expected outcomes were discussed with the patient.  The possibilities of pneumothorax, pneumonia, reaction to medication, pulmonary aspiration, perforation of a viscus, bleeding, failure to diagnose a condition and creating a complication requiring transfusion or operation were discussed with the patient who freely signed the consent.    Description of Procedure: The patient was seen in the Preoperative Area, was examined and was deemed appropriate to proceed.  The patient was taken to [facility] endoscopy room 3, identified as Bobby [name] and the procedure verified as Flexible Video Fiberoptic Bronchoscopy.  A Time Out was held and the above information confirmed.   General anesthesia was initiated. The video fiberoptic bronchoscope was introduced via the ETT and a general inspection was performed which showed normal trachea, normal main carina. The R sided airways were inspected and showed normal RUL, BI, RML and RLL. The L side was then inspected. The LLL, Lingular and LUL airways were normal.   Bronchoalveolar lavage was performed in the superior segment of  the right lower lobe with 120 cc normal saline instilled and approximately 35 cc returned.  This was sent for cell count and differential, microbiology, cytology.  Under fluoroscopic guidance transbronchial brushings were performed in the superior segment of the right lower lobe.  Under fluoroscopic guidance transbronchial biopsies were performed in multiple subsegments of both the superior segment of the right lower lobe and the anterior and posterior segments of the right upper lobe.  The patient tolerated the procedure well. The bronchoscope was removed. There were no obvious complications.   Samples: 1.  Bronchoalveolar lavage from the superior segment of the right lower lobe 2.  Transbronchial brushings superior segment right lower lobe 3.  Transbronchial forceps biopsy superior segment right lower lobe 4.  Transbronchial forceps biopsies anterior and posterior segments of the right upper lobe  Plans:  We will review the cytology, pathology and microbiology results with the patient when they become available.  Outpatient followup will be with Dr Lamonte Sakai and Dr. Marin Olp.    Baltazar Apo, MD, PhD 11/27/2022, 8:05 AM [facility] [phone] or if no answer [phone]

## 2022-11-27 NOTE — Transfer of Care (Signed)
Immediate Anesthesia Transfer of Care Note  Patient: Bobby [name]  Procedure(s) Performed: VIDEO BRONCHOSCOPY WITH FLUORO BRONCHIAL BIOPSIES BRONCHIAL WASHINGS BRONCHIAL BRUSHINGS  Patient Location: PACU  Anesthesia Type:General  Level of Consciousness: awake  Airway & Oxygen Therapy: Patient Spontanous Breathing  Post-op Assessment: Report given to RN and Post -op Vital signs reviewed and stable  Post vital signs: Reviewed and stable  Last Vitals:  Vitals Value Taken Time  BP 112/61 11/27/22 0815  Temp    Pulse 68 11/27/22 0819  Resp 14 11/27/22 0819  SpO2 96 % 11/27/22 0819  Vitals shown include unvalidated device data.  Last Pain:  Vitals:   11/27/22 0634  TempSrc:   PainSc: 0-No pain      Patients Stated Pain Goal: 3 (77/11/65 7903)  Complications: No notable events documented.

## 2022-11-27 NOTE — Interval H&P Note (Signed)
History and Physical Interval Note:  11/27/2022 7:19 AM  Bobby [name]  has presented today for surgery, with the diagnosis of RIGHT UPPER AND LOWER [place].  The various methods of treatment have been discussed with the patient and family. After consideration of risks, benefits and other options for treatment, the patient has consented to  Procedure(s): VIDEO BRONCHOSCOPY WITH FLUORO (N/A) as a surgical intervention.  The patient's history has been reviewed, patient examined, no change in status, stable for surgery.  I have reviewed the patient's chart and labs.  Questions were answered to the patient's satisfaction.     Collene Gobble

## 2022-11-27 NOTE — Discharge Instructions (Signed)
Flexible Bronchoscopy, Care After This sheet gives you information about how to care for yourself after your test. Your doctor may also give you more specific instructions. If you have problems or questions, contact your doctor. Follow these instructions at home: Eating and drinking When your numbness is gone and your cough and gag reflexes have come back, you may: Eat soft foods. Slowly drink liquids. The day after the test, go back to your normal diet. Driving Do not drive for 24 hours if you were given a medicine to help you relax (sedative). Do not drive or use heavy machinery while taking prescription pain medicine. General instructions  Take over-the-counter and prescription medicines only as told by your doctor. Return to your normal activities as told. Ask what activities are safe for you. Do not use any products that have nicotine or tobacco in them. This includes cigarettes and e-cigarettes. If you need help quitting, ask your doctor. Keep all follow-up visits as told by your doctor. This is important. It is very important if you had a tissue sample (biopsy) taken. Get help right away if: You have shortness of breath that gets worse. You get light-headed. You feel like you are going to pass out (faint). You have chest pain. You cough up: More than a little blood. More blood than before. Summary Do not eat or drink anything (not even water) for 2 hours after your test, or until your numbing medicine wears off. Do not use cigarettes. Do not use e-cigarettes. Get help right away if you have chest pain.  Please call our office for any questions or concerns.  [phone].  This information is not intended to replace advice given to you by your health care provider. Make sure you discuss any questions you have with your health care provider. Document Released: 09/03/2009 Document Revised: 10/19/2017 Document Reviewed: 11/24/2016 Elsevier Patient Education  2020 [employer].

## 2022-11-27 NOTE — Anesthesia Procedure Notes (Signed)
Procedure Name: Intubation Date/Time: 11/27/2022 7:37 AM  Performed by: Genelle Bal, CRNAPre-anesthesia Checklist: Patient identified, Emergency Drugs available, Suction available and Patient being monitored Patient Re-evaluated:Patient Re-evaluated prior to induction Oxygen Delivery Method: Circle system utilized Preoxygenation: Pre-oxygenation with 100% oxygen Induction Type: IV induction Ventilation: Mask ventilation without difficulty Laryngoscope Size: Miller and 2 Grade View: Grade I Tube type: Oral Tube size: 8.5 mm Number of attempts: 1 Airway Equipment and Method: Stylet and Oral airway Placement Confirmation: ETT inserted through vocal cords under direct vision, positive ETCO2 and breath sounds checked- equal and bilateral Secured at: 23 cm Tube secured with: Tape Dental Injury: Teeth and Oropharynx as per pre-operative assessment

## 2022-11-27 NOTE — Anesthesia Preprocedure Evaluation (Signed)
Anesthesia Evaluation  Patient identified by MRN, date of birth, ID band Patient awake    Reviewed: Allergy & Precautions, NPO status , Patient's Chart, lab work & pertinent test results  History of Anesthesia Complications Negative for: history of anesthetic complications  Airway Mallampati: II  TM Distance: >3 FB Neck ROM: Full    Dental  (+) Teeth Intact, Dental Advisory Given   Pulmonary neg pulmonary ROS   breath sounds clear to auscultation       Cardiovascular hypertension, Pt. on medications and Pt. on home beta blockers (-) angina (-) Past MI and (-) CHF  Rhythm:Regular  RIGHT UPPER AND LOWER [place] NODULER DISEASE   Neuro/Psych negative neurological ROS     GI/Hepatic negative GI ROS, Neg liver ROS,,,  Endo/Other  negative endocrine ROS    Renal/GU negative Renal ROS     Musculoskeletal negative musculoskeletal ROS (+)    Abdominal   Peds  Hematology negative hematology ROS (+)   Anesthesia Other Findings   Reproductive/Obstetrics                             Anesthesia Physical Anesthesia Plan  ASA: 2  Anesthesia Plan: General   Post-op Pain Management: Minimal or no pain anticipated   Induction: Intravenous  PONV Risk Score and Plan: 2 and Ondansetron, Dexamethasone, Propofol infusion and TIVA  Airway Management Planned: Oral ETT  Additional Equipment: None  Intra-op Plan:   Post-operative Plan: Extubation in OR  Informed Consent: I have reviewed the patients History and Physical, chart, labs and discussed the procedure including the risks, benefits and alternatives for the proposed anesthesia with the patient or authorized representative who has indicated his/her understanding and acceptance.     Dental advisory given  Plan Discussed with: CRNA  Anesthesia Plan Comments:        Anesthesia Quick Evaluation

## 2022-11-28 LAB — CYTOLOGY - NON PAP

## 2022-11-28 LAB — SURGICAL PATHOLOGY

## 2022-11-29 ENCOUNTER — Encounter (HOSPITAL_COMMUNITY): Payer: Self-pay | Admitting: Emergency Medicine

## 2022-11-29 LAB — CULTURE, BAL-QUANTITATIVE W GRAM STAIN: Culture: NO GROWTH

## 2022-11-29 LAB — ACID FAST SMEAR (AFB, MYCOBACTERIA): Acid Fast Smear: NEGATIVE

## 2022-11-29 NOTE — Anesthesia Postprocedure Evaluation (Signed)
Anesthesia Post Note  Patient: CIAN COSTANZO  Procedure(s) Performed: VIDEO BRONCHOSCOPY WITH FLUORO BRONCHIAL BIOPSIES BRONCHIAL WASHINGS BRONCHIAL BRUSHINGS     Patient location during evaluation: PACU Anesthesia Type: General Level of consciousness: awake and alert Pain management: pain level controlled Vital Signs Assessment: post-procedure vital signs reviewed and stable Respiratory status: spontaneous breathing, nonlabored ventilation and respiratory function stable Cardiovascular status: blood pressure returned to baseline and stable Postop Assessment: no apparent nausea or vomiting Anesthetic complications: no  No notable events documented.  Last Vitals:  Vitals:   11/27/22 0815 11/27/22 0851  BP:  130/71  Pulse:  (!) 58  Resp:  14  Temp: 36.5 C   SpO2:  98%    Last Pain:  Vitals:   11/27/22 0815  TempSrc:   PainSc: 0-No pain                 Denissa Cozart

## 2022-12-01 ENCOUNTER — Telehealth: Payer: Self-pay | Admitting: Emergency Medicine

## 2022-12-01 NOTE — Telephone Encounter (Signed)
Reviewed results from bronchoscopy with the patient.  His cytology and pathology are all negative for any malignancy, no evidence of granulomatous disease.  Bacterial culture negative and AFB, fungal cultures are all pending.  He has an office visit next week and we will assess his results and plan next steps.

## 2022-12-02 LAB — AEROBIC/ANAEROBIC CULTURE W GRAM STAIN (SURGICAL/DEEP WOUND): Culture: NO GROWTH

## 2022-12-05 ENCOUNTER — Encounter: Payer: Self-pay | Admitting: Emergency Medicine

## 2022-12-05 ENCOUNTER — Ambulatory Visit: Payer: Medicare HMO | Admitting: Emergency Medicine

## 2022-12-05 VITALS — BP 118/64 | HR 57 | Ht 72.0 in | Wt 176.4 lb

## 2022-12-05 DIAGNOSIS — H04201 Unspecified epiphora, right lacrimal gland: Secondary | ICD-10-CM | POA: Diagnosis not present

## 2022-12-05 DIAGNOSIS — R9389 Abnormal findings on diagnostic imaging of other specified body structures: Secondary | ICD-10-CM | POA: Diagnosis not present

## 2022-12-05 NOTE — Patient Instructions (Signed)
We reviewed your bronchoscopy results and your CT findings today. We will continue to follow your bronchoscopy culture information until it is finalized.  We will review this by phone. We will plan to repeat your CT scan of the chest in May 2024 to compare with priors. Follow-up with Dr. Lamonte Sakai in May after your CT so we can review the results together. Continue to follow with Dr. Marin Olp as planned

## 2022-12-05 NOTE — Assessment & Plan Note (Signed)
Bronchoscopy was reassuring.  Pathology and cytology all negative for granulomas or malignancy.  The cell count was bland.  Cultures are still pending, smear is negative.  We will review these with him when mature.  He needs a repeat CT chest to follow his mild parabronchial inflammation, will do in 6 months which would be May 2024.  We reviewed your bronchoscopy results and your CT findings today. We will continue to follow your bronchoscopy culture information until it is finalized.  We will review this by phone. We will plan to repeat your CT scan of the chest in May 2024 to compare with priors. Follow-up with Dr. Lamonte Sakai in May after your CT so we can review the results together. Continue to follow with Dr. Marin Olp as planned

## 2022-12-05 NOTE — Progress Notes (Signed)
Subjective:    Patient ID: Bobby [name], male    DOB: Feb 11, 1951, 72 y.o.   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 Marin Olp.  No evidence of hypogammaglobinemia per Dr. Antonieta Pert notes.  He also has a history of hypertension, SIADH with associated hyponatremia, melanoma in situ.  He is on Dupixent per dermatology.  He has undergone serial CT chest imaging with Dr. Marin Olp, most recently performed 09/25/2022 to follow some subtle micronodular disease and airways inflammation noted on his CT 07/26/2022.  He is here today to discuss that study. Today he reports that he feels well. He deals w intermittent low back pain for several years. No constitutional sx. No cough.   CT chest 09/25/2022 reviewed by me, shows no mediastinal or hilar adenopathy.  Scattered peribronchovascular nodularity and some nodular consolidation with an area of linear consolidation in the right lower lobe that is new.  Overall mild progression compared with his previous scan in September 2023.   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 Marin Olp.  I saw him after he had an abnormal CT chest in 09/2022 that showed some subtle micronodular disease and airways inflammation particularly in the right lower lobe, but notable in all lobes on the right.  Based on this we performed bronchoscopy 11/27/2022 with random transbronchial brushings and biopsies, BAL.  Cytology and pathology all negative for any malignancy or any evidence of granulomatous disease. Today he reports no respiratory symptoms. No fevers, wt loss.   Cell count WBC 181 (43% PMN, 41% lymphs, 16% mono) Fungal smears, AFB smear negative with final cultures pending.  Bacterial cultures are negative and final.   Review of Systems As per HPI  Past  Medical History:  Diagnosis Date   Anxiety and depression    Hypertension    Hyponatremia 03/2013   ongoing, seeing Dr. Posey Pronto   Insomnia    Melanoma in situ of back [facility])    biopsy on 03/05/2020   SIADH (syndrome of inappropriate ADH production) ([place]) 2017   Dr. Posey Pronto   Squamous cell carcinoma in situ      Family History  Problem Relation Age of Onset   Hypertension Mother    Stroke Mother    Diabetes Mother    Emphysema Mother        smoked   Thyroid disease Mother    Alcoholism Father    Colon cancer Neg Hx    Prostate cancer Neg Hx    CAD Neg Hx      Social History   Socioeconomic History   Marital status: Married    Spouse name: Not on file   Number of children: 2   Years of education: 16   Highest education level: Bachelor's degree (e.g., BA, AB, BS)  Occupational History   Occupation: semi-retired--business owner   Tobacco Use   Smoking status: Never   Smokeless tobacco: Never  Vaping Use   Vaping Use: Never used  Substance and Sexual Activity   Alcohol use: Yes    Alcohol/week: 0.0 standard drinks of alcohol    Comment: socially , 2 to 3 drinks  a day   Drug use: No   Sexual activity: Yes  Other Topics Concern   Not on file  Social History Narrative   [name], married    2 children  mom passed away June 2009   Right-handed.   2-3 cups coffee per day.   Lives at home with wife.            Social Determinants of Health   Financial Resource Strain: Low Risk  (08/10/2021)   Overall Financial Resource Strain (CARDIA)    Difficulty of Paying Living Expenses: Not hard at all  Food Insecurity: No Food Insecurity (08/10/2021)   Hunger Vital Sign    Worried About Running Out of Food in the Last Year: Never true    Ran Out of Food in the Last Year: Never true  Transportation Needs: No Transportation Needs (08/10/2021)   PRAPARE - Hydrologist (Medical): No    Lack of Transportation (Non-Medical): No  Physical Activity:  Insufficiently Active (08/10/2021)   Exercise Vital Sign    Days of Exercise per Week: 3 days    Minutes of Exercise per Session: 40 min  Stress: No Stress Concern Present (08/10/2021)   [place]    Feeling of Stress : Only a little  Social Connections: Moderately Isolated (08/10/2021)   Social Connection and Isolation Panel [NHANES]    Frequency of Communication with Friends and Family: More than three times a week    Frequency of Social Gatherings with Friends and Family: More than three times a week    Attends Religious Services: Never    Marine scientist or Organizations: No    Attends Archivist Meetings: Never    Marital Status: Married  Human resources officer Violence: Not At Risk (08/10/2021)   Humiliation, Afraid, Rape, and Kick questionnaire    Fear of Current or Ex-Partner: No    Emotionally Abused: No    Physically Abused: No    Sexually Abused: No     Allergies  Allergen Reactions   Tramadol Hives     Outpatient Medications Prior to Visit  Medication Sig Dispense Refill   carvedilol (COREG) 6.25 MG tablet TAKE ONE TABLET BY MOUTH TWICE A DAY WITH MEALS 180 tablet 2   cholecalciferol (VITAMIN D3) 25 MCG (1000 UNIT) tablet Take 1,000 Units by mouth daily.     clonazePAM (KLONOPIN) 0.5 MG tablet TAKE ONE TABLET BY MOUTH TWICE A DAY AS NEEDED FOR ANXIETY 60 tablet 2   COVID-19 mRNA vaccine 2023-2024 (COMIRNATY) syringe Inject into the muscle. 0.3 mL 0   DUPIXENT 300 MG/2ML SOPN Inject into the skin every 14 (fourteen) days.     folic acid (FOLVITE) 1 MG tablet Take 1 tablet (1 mg total) by mouth daily. 90 tablet 2   Multiple Vitamin (MULTIVITAMIN WITH MINERALS) TABS Take 1 tablet by mouth daily.     tadalafil (CIALIS) 10 MG tablet Take 1-2 tablets (10-20 mg total) by mouth every other day as needed for erectile dysfunction. 20 tablet 3   thiamine 100 MG tablet Take 1 tablet (100 mg total) by mouth  daily. 90 tablet 2   triamcinolone cream (KENALOG) 0.1 % SMARTSIG:1 Application Topical 2-3 Times Daily     zolpidem (AMBIEN) 10 MG tablet TAKE ONE TABLET BY MOUTH EVERY NIGHT AT BEDTIME AS NEEDED FOR SLEEP 90 tablet 1   No facility-administered medications prior to visit.        Objective:   Physical Exam Vitals:   12/05/22 0927  BP: 118/64  Pulse: (!) 57  SpO2: 98%  Weight: 176 lb 6.4 oz (80 kg)  Height: 6' (1.829 m)  Gen: Pleasant, well-nourished, in no distress,  normal affect  ENT: No lesions,  mouth clear,  oropharynx clear, no postnasal drip  Neck: No JVD, no stridor  Lungs: No use of accessory muscles, no crackles or wheezing on normal respiration, no wheeze on forced expiration  Cardiovascular: RRR, heart sounds normal, no murmur or gallops, no peripheral edema  Musculoskeletal: No deformities, no cyanosis or clubbing  Neuro: alert, awake, non focal  Skin: Warm, no lesions or rash      Assessment & Plan:   Abnormal CT of the chest Bronchoscopy was reassuring.  Pathology and cytology all negative for granulomas or malignancy.  The cell count was bland.  Cultures are still pending, smear is negative.  We will review these with him when mature.  He needs a repeat CT chest to follow his mild parabronchial inflammation, will do in 6 months which would be May 2024.  We reviewed your bronchoscopy results and your CT findings today. We will continue to follow your bronchoscopy culture information until it is finalized.  We will review this by phone. We will plan to repeat your CT scan of the chest in May 2024 to compare with priors. Follow-up with Dr. Lamonte Sakai in May after your CT so we can review the results together. Continue to follow with Dr. Marin Olp as planned  Time spent 30 minutes  Baltazar Apo, MD, PhD 12/05/2022, 10:05 AM [facility] [phone] or if no answer before 7:00PM call [phone] For any issues after 7:00PM please  call eLink [phone]

## 2022-12-13 DIAGNOSIS — Z79899 Other long term (current) drug therapy: Secondary | ICD-10-CM | POA: Diagnosis not present

## 2022-12-13 DIAGNOSIS — L2089 Other atopic dermatitis: Secondary | ICD-10-CM | POA: Diagnosis not present

## 2022-12-14 ENCOUNTER — Telehealth (HOSPITAL_BASED_OUTPATIENT_CLINIC_OR_DEPARTMENT_OTHER): Payer: Self-pay

## 2022-12-20 DIAGNOSIS — H5203 Hypermetropia, bilateral: Secondary | ICD-10-CM | POA: Diagnosis not present

## 2022-12-27 ENCOUNTER — Telehealth: Payer: Self-pay | Admitting: Internal Medicine

## 2022-12-27 NOTE — Telephone Encounter (Signed)
Requesting: clonazepam 0.'5mg'$   Contract: 11/03/20 UDS: 11/02/21 Last Visit: 02/15/22 Next Visit: 02/21/23 Last Refill:08/24/22 #60 and 2RF   Please Advise

## 2022-12-27 NOTE — Telephone Encounter (Signed)
PDMP okay, Rx sent 

## 2022-12-28 LAB — FUNGAL ORGANISM REFLEX

## 2022-12-28 LAB — FUNGUS CULTURE WITH STAIN

## 2022-12-28 LAB — FUNGUS CULTURE RESULT

## 2023-01-11 LAB — ACID FAST CULTURE WITH REFLEXED SENSITIVITIES (MYCOBACTERIA): Acid Fast Culture: NEGATIVE

## 2023-01-23 ENCOUNTER — Encounter: Payer: Self-pay | Admitting: Medical Oncology

## 2023-01-23 ENCOUNTER — Other Ambulatory Visit: Payer: Self-pay

## 2023-01-23 ENCOUNTER — Inpatient Hospital Stay (HOSPITAL_BASED_OUTPATIENT_CLINIC_OR_DEPARTMENT_OTHER): Payer: Medicare HMO | Admitting: Medical Oncology

## 2023-01-23 ENCOUNTER — Inpatient Hospital Stay: Payer: Medicare HMO | Attending: Hematology & Oncology

## 2023-01-23 VITALS — BP 134/71 | HR 60 | Temp 97.7°F | Resp 18 | Ht 72.0 in | Wt 181.4 lb

## 2023-01-23 DIAGNOSIS — M549 Dorsalgia, unspecified: Secondary | ICD-10-CM | POA: Insufficient documentation

## 2023-01-23 DIAGNOSIS — M5441 Lumbago with sciatica, right side: Secondary | ICD-10-CM

## 2023-01-23 DIAGNOSIS — G8929 Other chronic pain: Secondary | ICD-10-CM | POA: Insufficient documentation

## 2023-01-23 DIAGNOSIS — E222 Syndrome of inappropriate secretion of antidiuretic hormone: Secondary | ICD-10-CM | POA: Diagnosis not present

## 2023-01-23 DIAGNOSIS — C9110 Chronic lymphocytic leukemia of B-cell type not having achieved remission: Secondary | ICD-10-CM

## 2023-01-23 DIAGNOSIS — Z79899 Other long term (current) drug therapy: Secondary | ICD-10-CM | POA: Diagnosis not present

## 2023-01-23 DIAGNOSIS — D72820 Lymphocytosis (symptomatic): Secondary | ICD-10-CM | POA: Insufficient documentation

## 2023-01-23 LAB — CBC WITH DIFFERENTIAL (CANCER CENTER ONLY)
Abs Immature Granulocytes: 0.03 10*3/uL (ref 0.00–0.07)
Basophils Absolute: 0.1 10*3/uL (ref 0.0–0.1)
Basophils Relative: 0 %
Eosinophils Absolute: 0.3 10*3/uL (ref 0.0–0.5)
Eosinophils Relative: 2 %
HCT: 35.9 % — ABNORMAL LOW (ref 39.0–52.0)
Hemoglobin: 12.6 g/dL — ABNORMAL LOW (ref 13.0–17.0)
Immature Granulocytes: 0 %
Lymphocytes Relative: 75 %
Lymphs Abs: 13.7 10*3/uL — ABNORMAL HIGH (ref 0.7–4.0)
MCH: 32.5 pg (ref 26.0–34.0)
MCHC: 35.1 g/dL (ref 30.0–36.0)
MCV: 92.5 fL (ref 80.0–100.0)
Monocytes Absolute: 0.7 10*3/uL (ref 0.1–1.0)
Monocytes Relative: 4 %
Neutro Abs: 3.4 10*3/uL (ref 1.7–7.7)
Neutrophils Relative %: 19 %
Platelet Count: 214 10*3/uL (ref 150–400)
RBC: 3.88 MIL/uL — ABNORMAL LOW (ref 4.22–5.81)
RDW: 12.2 % (ref 11.5–15.5)
Smear Review: NORMAL
WBC Count: 18.2 10*3/uL — ABNORMAL HIGH (ref 4.0–10.5)
nRBC: 0 % (ref 0.0–0.2)

## 2023-01-23 LAB — CMP (CANCER CENTER ONLY)
ALT: 16 U/L (ref 0–44)
AST: 24 U/L (ref 15–41)
Albumin: 4.5 g/dL (ref 3.5–5.0)
Alkaline Phosphatase: 57 U/L (ref 38–126)
Anion gap: 7 (ref 5–15)
BUN: 19 mg/dL (ref 8–23)
CO2: 27 mmol/L (ref 22–32)
Calcium: 9.2 mg/dL (ref 8.9–10.3)
Chloride: 93 mmol/L — ABNORMAL LOW (ref 98–111)
Creatinine: 1.01 mg/dL (ref 0.61–1.24)
GFR, Estimated: >60 mL/min (ref >60)
Glucose, Bld: 99 mg/dL (ref 70–99)
Potassium: 5.2 mmol/L — ABNORMAL HIGH (ref 3.5–5.1)
Sodium: 127 mmol/L — ABNORMAL LOW (ref 135–145)
Total Bilirubin: 0.5 mg/dL (ref 0.3–1.2)
Total Protein: 6.8 g/dL (ref 6.5–8.1)

## 2023-01-23 LAB — SAVE SMEAR(SSMR), FOR PROVIDER SLIDE REVIEW

## 2023-01-23 LAB — LACTATE DEHYDROGENASE: LDH: 144 U/L (ref 98–192)

## 2023-01-23 NOTE — Progress Notes (Signed)
Hematology and Oncology Follow Up Visit  Bobby [name] [id number] Oct 12, 1951 71 y.o. 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  Presents with his wife.   At his last visit he was sent to Pulmonary Medicine for an evaluation for CT abnormality. He is being followed by Dr. Lamonte Sakai with last visit on 12/05/2022. Had a reassuring bronchoscopy with negative pathology/cytology for granulomas or malignancy. Due for repeat chest CT in May 2024.   He has had no fever.  He has had no swollen lymph nodes.  He has had no bleeding.  There is been no rashes.  Only concern is some chronic back pain that seems to be slowly worsening. No injury. Midline with some right sided sciatica. He is seeing a Restaurant manager, fast food and this seems to help a bit. Not taking anything for the discomfort. No saddle anesthesia or incontinence but has had a mild foot drop of the right leg for months.   Overall, I would say his performance status is ECOG 0.  Wt Readings from Last 3 Encounters:  12/05/22 176 lb 6.4 oz (80 kg)  11/27/22 174 lb (78.9 kg)  11/01/22 178 lb 9.6 oz (81 kg)     Medications:  Current Outpatient Medications:    carvedilol (COREG) 6.25 MG tablet, TAKE ONE TABLET BY MOUTH TWICE A DAY WITH MEALS, Disp: 180 tablet, Rfl: 2   cholecalciferol (VITAMIN D3) 25 MCG (1000 UNIT) tablet, Take 1,000 Units by mouth daily., Disp: , Rfl:    clonazePAM (KLONOPIN) 0.5 MG tablet, TAKE ONE TABLET BY MOUTH TWICE A DAY AS NEEDED FOR ANXIETY, Disp: 60 tablet, Rfl: 1   COVID-19 mRNA vaccine 2023-2024 (COMIRNATY) syringe, Inject into the muscle., Disp: 0.3 mL, Rfl: 0   DUPIXENT 300 MG/2ML SOPN, Inject into the skin every 14 (fourteen) days., Disp: , Rfl:    folic acid (FOLVITE) 1 MG tablet, Take 1 tablet (1 mg total) by mouth daily., Disp: 90 tablet, Rfl: 2   Multiple Vitamin (MULTIVITAMIN  WITH MINERALS) TABS, Take 1 tablet by mouth daily., Disp: , Rfl:    tadalafil (CIALIS) 10 MG tablet, Take 1-2 tablets (10-20 mg total) by mouth every other day as needed for erectile dysfunction., Disp: 20 tablet, Rfl: 3   thiamine 100 MG tablet, Take 1 tablet (100 mg total) by mouth daily., Disp: 90 tablet, Rfl: 2   triamcinolone cream (KENALOG) 0.1 %, SMARTSIG:1 Application Topical 2-3 Times Daily, Disp: , Rfl:    zolpidem (AMBIEN) 10 MG tablet, TAKE ONE TABLET BY MOUTH EVERY NIGHT AT BEDTIME AS NEEDED FOR SLEEP, Disp: 90 tablet, Rfl: 1  Allergies:  Allergies  Allergen Reactions   Tramadol Hives    Past Medical History, Surgical history, Social history, and Family History were reviewed and updated.  Review of Systems: Review of Systems  Constitutional: Negative.   HENT:  Negative.    Eyes: Negative.   Respiratory: Negative.    Cardiovascular: Negative.   Gastrointestinal: Negative.   Endocrine: Negative.   Genitourinary: Negative.    Musculoskeletal:  Positive for back pain.  Skin: Negative.   Neurological: Negative.   Hematological: Negative.   Psychiatric/Behavioral: Negative.      Physical Exam:  vitals were not taken for this visit.   Wt Readings from Last 3 Encounters:  12/05/22 176 lb 6.4 oz (80 kg)  11/27/22 174 lb (78.9 kg)  11/01/22 178 lb  9.6 oz (81 kg)    Physical Exam Vitals reviewed.  HENT:     Head: Normocephalic and atraumatic.  Eyes:     Pupils: Pupils are equal, round, and reactive to light.  Cardiovascular:     Rate and Rhythm: Normal rate and regular rhythm.     Heart sounds: Normal heart sounds.  Pulmonary:     Effort: Pulmonary effort is normal.     Breath sounds: Normal breath sounds.  Abdominal:     General: Bowel sounds are normal.     Palpations: Abdomen is soft.  Musculoskeletal:        General: No tenderness or deformity. Normal range of motion.     Cervical back: Normal range of motion.  Lymphadenopathy:     Cervical: No  cervical adenopathy.  Skin:    General: Skin is warm and dry.     Findings: No erythema or rash.  Neurological:     Mental Status: He is alert and oriented to person, place, and time.     Comments: Mild foot drop of the right leg   Psychiatric:        Behavior: Behavior normal.        Thought Content: Thought content normal.        Judgment: Judgment normal.      Lab Results  Component Value Date   WBC 18.2 (H) 01/23/2023   HGB 12.6 (L) 01/23/2023   HCT 35.9 (L) 01/23/2023   MCV 92.5 01/23/2023   PLT 214 01/23/2023     Chemistry      Component Value Date/Time   NA 127 (L) 01/23/2023 0900   NA 138 08/21/2022 0000   K 5.2 (H) 01/23/2023 0900   CL 93 (L) 01/23/2023 0900   CO2 27 01/23/2023 0900   BUN 19 01/23/2023 0900   BUN 15 08/21/2022 0000   CREATININE 1.01 01/23/2023 0900   GLU 89 08/21/2022 0000      Component Value Date/Time   CALCIUM 9.2 01/23/2023 0900   ALKPHOS 57 01/23/2023 0900   AST 24 01/23/2023 0900   ALT 16 01/23/2023 0900   BILITOT 0.5 01/23/2023 0900       Impression and Plan: Bobby [name] is a very nice 72 year old white male.  He has a monoclonal B-cell population of lymphocytes.  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  Very happy to read his visit report from Pulmonary. I recommend that he continue follow up with their office.   He will continue to follow up with his nephrologist regarding his SIADH/electrolyte abnormalities.   In terms of his lymphocytosis his WBC has increased to 18.2 (was 14.9 at previous visit). Baseline is 15.9 over the past 6 months. No fatigue, lymphadenopathy, unintentional weight loss or night sweats. Hemoglobin is improved to 12.6 from 12.1. Will investigate his back pain with MR imaging and referral to orthopedics. Discussed with Dr. Elnoria Howard.    I would like to see him back myself in about 3 months with labs    Bobby [name], [place] 3/5/20249:34 AM

## 2023-01-24 ENCOUNTER — Other Ambulatory Visit: Payer: Medicare HMO

## 2023-01-24 ENCOUNTER — Ambulatory Visit: Payer: Medicare HMO | Admitting: Hematology & Oncology

## 2023-02-07 ENCOUNTER — Ambulatory Visit (HOSPITAL_BASED_OUTPATIENT_CLINIC_OR_DEPARTMENT_OTHER)
Admission: RE | Admit: 2023-02-07 | Discharge: 2023-02-07 | Disposition: A | Discharge status: Home / Self Care | Payer: Medicare HMO | Source: Ambulatory Visit | Attending: Medical Oncology | Admitting: Medical Oncology

## 2023-02-07 DIAGNOSIS — M21371 Foot drop, right foot: Secondary | ICD-10-CM | POA: Diagnosis not present

## 2023-02-07 DIAGNOSIS — C9110 Chronic lymphocytic leukemia of B-cell type not having achieved remission: Secondary | ICD-10-CM

## 2023-02-07 DIAGNOSIS — M4316 Spondylolisthesis, lumbar region: Secondary | ICD-10-CM | POA: Diagnosis not present

## 2023-02-07 DIAGNOSIS — M5441 Lumbago with sciatica, right side: Secondary | ICD-10-CM | POA: Insufficient documentation

## 2023-02-07 DIAGNOSIS — M549 Dorsalgia, unspecified: Secondary | ICD-10-CM | POA: Diagnosis not present

## 2023-02-07 DIAGNOSIS — G8929 Other chronic pain: Secondary | ICD-10-CM

## 2023-02-07 DIAGNOSIS — M545 Low back pain, unspecified: Secondary | ICD-10-CM | POA: Diagnosis not present

## 2023-02-12 ENCOUNTER — Encounter: Payer: Self-pay | Admitting: Hematology & Oncology

## 2023-02-12 DIAGNOSIS — G8929 Other chronic pain: Secondary | ICD-10-CM

## 2023-02-21 ENCOUNTER — Encounter: Payer: Self-pay | Admitting: Internal Medicine

## 2023-02-21 ENCOUNTER — Ambulatory Visit (INDEPENDENT_AMBULATORY_CARE_PROVIDER_SITE_OTHER): Payer: Medicare HMO | Admitting: Internal Medicine

## 2023-02-21 VITALS — BP 126/82 | HR 61 | Temp 97.9°F | Resp 16 | Ht 72.0 in | Wt 180.1 lb

## 2023-02-21 DIAGNOSIS — R69 Illness, unspecified: Secondary | ICD-10-CM | POA: Diagnosis not present

## 2023-02-21 DIAGNOSIS — I10 Essential (primary) hypertension: Secondary | ICD-10-CM

## 2023-02-21 DIAGNOSIS — E785 Hyperlipidemia, unspecified: Secondary | ICD-10-CM

## 2023-02-21 DIAGNOSIS — G4700 Insomnia, unspecified: Secondary | ICD-10-CM

## 2023-02-21 DIAGNOSIS — C9110 Chronic lymphocytic leukemia of B-cell type not having achieved remission: Secondary | ICD-10-CM | POA: Insufficient documentation

## 2023-02-21 DIAGNOSIS — F411 Generalized anxiety disorder: Secondary | ICD-10-CM

## 2023-02-21 DIAGNOSIS — Z0001 Encounter for general adult medical examination with abnormal findings: Secondary | ICD-10-CM | POA: Diagnosis not present

## 2023-02-21 DIAGNOSIS — Z79899 Other long term (current) drug therapy: Secondary | ICD-10-CM | POA: Diagnosis not present

## 2023-02-21 DIAGNOSIS — Z0000 Encounter for general adult medical examination without abnormal findings: Secondary | ICD-10-CM

## 2023-02-21 LAB — LIPID PANEL
Cholesterol: 152 mg/dL (ref 0–200)
HDL: 54.5 mg/dL (ref >39.00)
LDL Cholesterol: 82 mg/dL (ref 0–99)
NonHDL: 97.31
Total CHOL/HDL Ratio: 3
Triglycerides: 76 mg/dL (ref 0.0–149.0)
VLDL: 15.2 mg/dL (ref 0.0–40.0)

## 2023-02-21 LAB — TSH: TSH: 1.36 u[IU]/mL (ref 0.35–5.50)

## 2023-02-21 LAB — PSA: PSA: 2.12 ng/mL (ref 0.10–4.00)

## 2023-02-21 NOTE — Progress Notes (Signed)
Subjective:    Patient ID: Bobby [name], male    DOB: Apr 30, 1951, 72 y.o.   MRN: [id number]  DOS:  02/21/2023 Type of visit - description: CPX, here with his wife  Since the last office visit, saw multiple doctors, chart is reviewed. In general feels well, has occasional low back pain. Sleeps well as long as he takes Ambien. No recent ambulatory BPs  Review of Systems  Other than above, a 14 point review of systems is negative     Past Medical History:  Diagnosis Date   Anxiety and depression    Hypertension    Hyponatremia 03/2013   ongoing, seeing Dr. Posey Pronto   Insomnia    Melanoma in situ of back    biopsy on 03/05/2020   SIADH (syndrome of inappropriate ADH production) 2017   Dr. Posey Pronto   Squamous cell carcinoma in situ     Past Surgical History:  Procedure Laterality Date   BRONCHIAL BIOPSY  11/27/2022   Procedure: BRONCHIAL BIOPSIES;  Surgeon: Collene Gobble, MD;  Location: [place];  Service: Cardiopulmonary;;   BRONCHIAL BRUSHINGS  11/27/2022   Procedure: BRONCHIAL BRUSHINGS;  Surgeon: Collene Gobble, MD;  Location: [place];  Service: Cardiopulmonary;;   BRONCHIAL WASHINGS  11/27/2022   Procedure: BRONCHIAL WASHINGS;  Surgeon: Collene Gobble, MD;  Location: [facility];  Service: Cardiopulmonary;;   MOHS SURGERY  ~ 05-23-20   face, [facility]   SHOULDER SURGERY  1980s   right shoulder   VIDEO BRONCHOSCOPY N/A 11/27/2022   Procedure: VIDEO BRONCHOSCOPY WITH FLUORO;  Surgeon: Collene Gobble, MD;  Location: [place];  Service: Cardiopulmonary;  Laterality: N/A;   Social History   Socioeconomic History   Marital status: Married    Spouse name: Not on file   Number of children: 2   Years of education: 16   Highest education level: Bachelor's degree (e.g., BA, AB, BS)  Occupational History   Occupation: semi-retired--business owner   Tobacco Use   Smoking status: Never   Smokeless tobacco: Never  Vaping Use   Vaping Use: Never used  Substance and  Sexual Activity   Alcohol use: Yes    Comment: 1-2 qd   Drug use: No   Sexual activity: Yes  Other Topics Concern   Not on file  Social History Narrative   [name], married    2 children   mom passed away 05-23-2008   Right-handed.   2-3 cups coffee per day.   Lives at home with wife.            Social Determinants of Health   Financial Resource Strain: Low Risk  (08/10/2021)   Overall Financial Resource Strain (CARDIA)    Difficulty of Paying Living Expenses: Not hard at all  Food Insecurity: No Food Insecurity (08/10/2021)   Hunger Vital Sign    Worried About Running Out of Food in the Last Year: Never true    Ran Out of Food in the Last Year: Never true  Transportation Needs: No Transportation Needs (08/10/2021)   PRAPARE - Hydrologist (Medical): No    Lack of Transportation (Non-Medical): No  Physical Activity: Insufficiently Active (08/10/2021)   Exercise Vital Sign    Days of Exercise per Week: 3 days    Minutes of Exercise per Session: 40 min  Stress: No Stress Concern Present (08/10/2021)   [place]    Feeling of Stress :  Only a little  Social Connections: Moderately Isolated (08/10/2021)   Social Connection and Isolation Panel [NHANES]    Frequency of Communication with Friends and Family: More than three times a week    Frequency of Social Gatherings with Friends and Family: More than three times a week    Attends Religious Services: Never    Marine scientist or Organizations: No    Attends Archivist Meetings: Never    Marital Status: Married  Human resources officer Violence: Not At Risk (08/10/2021)   Humiliation, Afraid, Rape, and Kick questionnaire    Fear of Current or Ex-Partner: No    Emotionally Abused: No    Physically Abused: No    Sexually Abused: No     Current Outpatient Medications  Medication Instructions   b complex vitamins capsule 1  capsule, Oral, Daily   carvedilol (COREG) 6.25 MG tablet TAKE ONE TABLET BY MOUTH TWICE A DAY WITH MEALS   cholecalciferol (VITAMIN D3) 1,000 Units, Oral, Daily   clonazePAM (KLONOPIN) 0.5 MG tablet TAKE ONE TABLET BY MOUTH TWICE A DAY AS NEEDED FOR ANXIETY   DUPIXENT 300 MG/2ML SOPN Subcutaneous, Every 14 days   folic acid (FOLVITE) 1 mg, Oral, Daily   Multiple Vitamin (MULTIVITAMIN WITH MINERALS) TABS 1 tablet, Oral, Daily   niacinamide 500 mg, Oral, 2 times daily with meals   tadalafil (CIALIS) 10-20 mg, Oral, Every 48 hours PRN   thiamine (VITAMIN B1) 100 mg, Oral, Daily   triamcinolone cream (KENALOG) 0.1 % SMARTSIG:1 Application Topical 2-3 Times Daily   zolpidem (AMBIEN) 10 MG tablet TAKE ONE TABLET BY MOUTH EVERY NIGHT AT BEDTIME AS NEEDED FOR SLEEP       Objective:   Physical Exam BP 126/82   Pulse 61   Temp 97.9 F (36.6 C) (Oral)   Resp 16   Ht 6' (1.829 m)   Wt 180 lb 2 oz (81.7 kg)   SpO2 98%   BMI 24.43 kg/m  General: Well developed, NAD, BMI noted Neck: No  thyromegaly  HEENT:  Normocephalic . Face symmetric, atraumatic Lungs:  CTA B Normal respiratory effort, no intercostal retractions, no accessory muscle use. Heart: RRR,  no murmur.  Abdomen:  Not distended, soft, non-tender. No rebound or rigidity.   Lower extremities: no pretibial edema bilaterally  Skin: Exposed areas without rash. Not pale. Not jaundice Neurologic:  alert & oriented X3.  Speech normal, gait appropriate for age and unassisted Strength symmetric and appropriate for age.  Psych: Cognition and judgment appear intact.  Cooperative with normal attention span and concentration.  Behavior appropriate. No anxious or depressed appearing.     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 MCI per neuro visit 12/2017 Hyponatremia  onset 2014, admitted. Dx SIADH (renal 12/2015:RX to watch fluid intake and sx -gait d/o, MS changes) H/o vit D def Oncology: - SCC: DX 07-2019 -Melanoma dx 02/2020 (R upper back);  ~12-2021nose-Moh's (per pt)  -CLL Dx 2023  Abnormal CT chest, bronchoscopy 11-2022, BX negative for malignant .   Next CT May 2024 Dermatology- on Dupixent   PLAN:  Here for CPX HTN: BP today looks good, on carvedilol. Anxiety, depression, insomnia: On Ambien, clonazepam.  Well-controlled.  UDS and contract today. CV RF: Calculated risk is 22%, high, in addition, CT chest done few months ago show aortic atherosclerosis  and coronary artery calcifications.  With this in mind he qualify for statins  despite relatively low LDL. This was extensively discussed with the patient and his wife.  Multiple question answered to the best of my ability.  Plan is to check FLP today and further advise with results.  Offered  a referral to the lipid clinic if so desired. CLL: Dx last year, per oncology Abnormal CT chest: Follow-up by a bronchoscopy which showed no malignancy. RTC 1 year  In addition to CPX,   we had extensive discussion w/ pt and his wife about the need for a statin given his calculated risk and CT chest results.  Multiple questions answered to the best of my ability.

## 2023-02-21 NOTE — Patient Instructions (Signed)
Vaccines I recommend: Pneumonia shot, specifically one called  PNM 20 Visit the [facility] website to learn more about statins and high cholesterol  Check the  blood pressure regularly BP GOAL is between 110/65 and  135/85. If it is consistently higher or lower, let me know      GO TO THE LAB : Get the blood work     [name], [place] back for a physical exam in 1 year

## 2023-02-21 NOTE — Assessment & Plan Note (Signed)
Here for CPX HTN: BP today looks good, on carvedilol. Anxiety, depression, insomnia: On Ambien, clonazepam.  Well-controlled.  UDS and contract today. CV RF: Calculated risk is 22%, high, in addition, CT chest done few months ago show aortic atherosclerosis and coronary artery calcifications.  With this in mind he qualify for statins  despite relatively low LDL. This was extensively discussed with the patient and his wife.  Multiple question answered to the best of my ability.  Plan is to check FLP today and further advise with results.  Offered  a referral to the lipid clinic if so desired. CLL: Dx last year, per oncology Abnormal CT chest: Follow-up by a bronchoscopy which showed no malignancy. RTC 1 year

## 2023-02-21 NOTE — Assessment & Plan Note (Signed)
-  Tdap   09/2019 - zostavax 2014, s/p Shingrix x2 -  PNM 23: 27 (age ~50),  Prevnar: 2016  - had a RSV - Rec   PNM 20 (@ the pharmacy), flu shot q fall  -COVID vaccine: utd per pt  -CCS: Dr. Watt Climes Cscope ~ 2007,Cscope 08-2009; Tubular adenoma per bx report; colonoscopy 09-2015, no polyps. Cscope 05-17-21 -- 5 years per path report - prostate ca screening: No FH, no symptoms.  Check PSA -Diet and exercise: Encouraged to continue exercising regularly, he likes bike road, safety encourage - Labs reviewed, will get a FLP TSH and PSA - EtOH: Reports he has 1 or 2 servings at night. - ACP on file

## 2023-02-22 MED ORDER — ATORVASTATIN CALCIUM 20 MG PO TABS: 20.0000 mg | ORAL_TABLET | Freq: Every day | ORAL | 0 refills | Status: DC

## 2023-02-22 NOTE — Addendum Note (Signed)
Addended byDamita Dunnings D on: 02/22/2023 03:50 PM   Modules accepted: Orders

## 2023-02-23 LAB — DRUG MONITORING PANEL 375977 , URINE
Alcohol Metabolites: POSITIVE ng/mL — AB (ref <500)
Alphahydroxyalprazolam: NEGATIVE ng/mL (ref <25)
Alphahydroxymidazolam: NEGATIVE ng/mL (ref <50)
Alphahydroxytriazolam: NEGATIVE ng/mL (ref <50)
Aminoclonazepam: 236 ng/mL — ABNORMAL HIGH (ref <25)
Amphetamines: NEGATIVE ng/mL (ref <500)
Barbiturates: NEGATIVE ng/mL (ref <300)
Benzodiazepines: POSITIVE ng/mL — AB (ref <100)
Cocaine Metabolite: NEGATIVE ng/mL (ref <150)
Desmethyltramadol: NEGATIVE ng/mL (ref <100)
Ethyl Glucuronide (ETG): >10000 ng/mL — ABNORMAL HIGH (ref <500)
Ethyl Sulfate (ETS): >10000 ng/mL — ABNORMAL HIGH (ref <100)
Hydroxyethylflurazepam: NEGATIVE ng/mL (ref <50)
Lorazepam: NEGATIVE ng/mL (ref <50)
Marijuana Metabolite: NEGATIVE ng/mL (ref <20)
Nordiazepam: NEGATIVE ng/mL (ref <50)
Opiates: NEGATIVE ng/mL (ref <100)
Oxazepam: NEGATIVE ng/mL (ref <50)
Oxycodone: NEGATIVE ng/mL (ref <100)
Temazepam: NEGATIVE ng/mL (ref <50)
Tramadol: NEGATIVE ng/mL (ref <100)

## 2023-02-23 LAB — DM TEMPLATE

## 2023-03-02 DIAGNOSIS — M5416 Radiculopathy, lumbar region: Secondary | ICD-10-CM | POA: Diagnosis not present

## 2023-03-06 DIAGNOSIS — M48062 Spinal stenosis, lumbar region with neurogenic claudication: Secondary | ICD-10-CM | POA: Diagnosis not present

## 2023-03-12 ENCOUNTER — Telehealth: Payer: Self-pay | Admitting: Internal Medicine

## 2023-03-12 NOTE — Telephone Encounter (Signed)
PDMP okay, prescription sent 

## 2023-03-12 NOTE — Telephone Encounter (Signed)
Requesting: clonazepam 0.5mg   Contract: 11/03/20 UDS: 02/21/23 Last Visit: 02/21/23 Next Visit: 02/27/24 Last Refill: 12/27/22 #60 and 1RF  Please Advise

## 2023-03-13 ENCOUNTER — Other Ambulatory Visit: Payer: Self-pay | Admitting: Internal Medicine

## 2023-03-19 ENCOUNTER — Ambulatory Visit: Payer: Medicare HMO | Admitting: Physical Therapy

## 2023-03-20 DIAGNOSIS — M48062 Spinal stenosis, lumbar region with neurogenic claudication: Secondary | ICD-10-CM | POA: Diagnosis not present

## 2023-03-21 ENCOUNTER — Ambulatory Visit: Payer: Medicare HMO

## 2023-03-22 ENCOUNTER — Ambulatory Visit: Payer: Medicare HMO | Admitting: Physical Therapy

## 2023-03-27 ENCOUNTER — Ambulatory Visit: Payer: Medicare HMO | Attending: Orthopedic Surgery

## 2023-03-27 ENCOUNTER — Other Ambulatory Visit: Payer: Self-pay

## 2023-03-27 DIAGNOSIS — R2689 Other abnormalities of gait and mobility: Secondary | ICD-10-CM

## 2023-03-27 DIAGNOSIS — M48062 Spinal stenosis, lumbar region with neurogenic claudication: Secondary | ICD-10-CM | POA: Diagnosis not present

## 2023-03-27 DIAGNOSIS — M5459 Other low back pain: Secondary | ICD-10-CM | POA: Diagnosis not present

## 2023-03-27 NOTE — Therapy (Signed)
OUTPATIENT PHYSICAL THERAPY THORACOLUMBAR EVALUATION     Patient Name: Bobby [name] MRN: [id number] DOB:1951/06/30, 72 y.o., male Today's Date: 03/27/2023   END OF SESSION:   PT End of Session - 03/27/23 1318       Visit Number 1     Date for PT Re-Evaluation 06/19/23     Progress Note Due on Visit 10     PT Start Time 1318     PT Stop Time 1400     PT Time Calculation (min) 42 min     Activity Tolerance Patient tolerated treatment well     Behavior During Therapy [facility] for tasks assessed/performed                       Past Medical History:  Diagnosis Date   Anxiety and depression     Hypertension     Hyponatremia 03/2013    ongoing, seeing Dr. Allena Katz   Insomnia     Melanoma in situ of back [facility])      biopsy on 03/05/2020   SIADH (syndrome of inappropriate ADH production) (HCC) 2017    Dr. Allena Katz   Squamous cell carcinoma in situ           Past Surgical History:  Procedure Laterality Date   BRONCHIAL BIOPSY   11/27/2022    Procedure: BRONCHIAL BIOPSIES;  Surgeon: Leslye Peer, MD;  Location: [facility] ENDOSCOPY;  Service: Cardiopulmonary;;   BRONCHIAL BRUSHINGS   11/27/2022    Procedure: BRONCHIAL BRUSHINGS;  Surgeon: Leslye Peer, MD;  Location: [facility] ENDOSCOPY;  Service: Cardiopulmonary;;   BRONCHIAL WASHINGS   11/27/2022    Procedure: BRONCHIAL WASHINGS;  Surgeon: Leslye Peer, MD;  Location: [facility] ENDOSCOPY;  Service: Cardiopulmonary;;   MOHS SURGERY   ~ 04/2020    face, [facility]   SHOULDER SURGERY   1980s    right shoulder   VIDEO BRONCHOSCOPY N/A 11/27/2022    Procedure: VIDEO BRONCHOSCOPY WITH FLUORO;  Surgeon: Leslye Peer, MD;  Location: [facility] ENDOSCOPY;  Service: Cardiopulmonary;  Laterality: N/A;        Patient Active Problem List    Diagnosis Date Noted   CLL (chronic lymphocytic leukemia) (HCC) 02/21/2023   Abnormal CT of the chest 11/01/2022   Melanoma in situ of back (HCC) 03/16/2020   Squamous cell carcinoma in situ 08/21/2019   Vitamin D deficiency  05/29/2019   Cough 01/01/2016   PCP NOTES >>>> 09/28/2015   Hypogonadism in male 05/13/2015   SIADH (syndrome of inappropriate ADH production) (HCC) 04/13/2013   Memory problem 04/13/2013   Annual physical exam 03/06/2012   THORACOLUMBAR SCOLIOSIS, MILD 08/18/2010   BACK PAIN, LUMBAR 07/21/2010   Anxiety state 04/01/2009   Essential hypertension, benign 01/04/2009   INSOMNIA-SLEEP DISORDER-UNSPEC 09/08/2008      PCP: Willow Ora MD   REFERRING PROVIDER: Estill Bamberg, MD     REFERRING DIAG: chronic LBP   Rationale for Evaluation and Treatment: Rehabilitation   THERAPY DIAG:  Spinal stenosis of lumbar region with neurogenic claudication   Other abnormalities of gait and mobility   ONSET DATE: x 3 years 2021   SUBJECTIVE:  SUBJECTIVE STATEMENT: Walking I a problem, getting out of bed hurts a lot. Was working out in my home gym 3 x week, lifting dead weights.  Bench press, curl/ used road bike for cardio, has started about 2 week ago.     PERTINENT HISTORY:  H/o of 3 yrs of lower back pain which affects his gait.  Had a shot the other day. 1 week ago,  which helped for 2 to 3 days     PAIN:  Are you having pain? Yes: NPRS scale: 0/10 Pain location: central lower back L4-S1 region pointed to by patient Pain description: much better with shot Aggravating factors: getting up in am  Relieving factors: take aspirin   PRECAUTIONS: None   WEIGHT BEARING RESTRICTIONS: No   FALLS:  Has patient fallen in last 6 months? No   LIVING ENVIRONMENT: Lives with: lives with their spouse Lives in: House/apartment Stairs: Yes: Internal: 11 steps; can reach both and External: 5 steps; on right going up Has following equipment at home: None   OCCUPATION: works at desk, on computer most of the day    PLOF: Independent   PATIENT GOALS: get my back strong while I am not hurting from the shot   NEXT MD VISIT: approximately 6 weeks   OBJECTIVE:  MRI from 03/14/23: DIAGNOSTIC FINDINGS:  IMPRESSION: 1. At L4-5 there is a broad-based disc bulge. Severe bilateral facet arthropathy and ligamentum flavum infolding. Severe spinal stenosis. No left foraminal stenosis. Severe right foraminal stenosis. 2. At L5-S1 there is a broad-based disc osteophyte complex. Moderate bilateral facet arthropathy with a small right facet effusion. Severe left foraminal stenosis. Mild right foraminal stenosis. 3. At L3-4 there is a broad-based disc bulge. Moderate bilateral facet arthropathy. Mild spinal stenosis. Moderate right and mild left foraminal stenosis. 4. No acute osseous injury of the lumbar spine.   IMPRESSION: 1. At T1-2 there is a minimal broad-based disc osteophyte complex. Moderate-severe bilateral foraminal stenosis. 2. No acute osseous injury of the thoracic spine. 3. Partially visualized cervical spine spondylosis. If there is further clinical concern, recommend a dedicated MRI of the cervical spine.       PATIENT SURVEYS:  Modified Oswestry 20/50    SCREENING FOR RED FLAGS: Bowel or bladder incontinence: No Spinal tumors: No Cauda equina syndrome: No Compression fracture: No Abdominal aneurysm: No   COGNITION: Overall cognitive status: Within functional limits for tasks assessed                          SENSATION: WFL   MUSCLE LENGTH: Hamstrings: Right 35 deg; Left 35 deg Thomas test: Right wnl deg; Left wnl deg   POSTURE: decreased lumbar lordosis and R lateral pelvic shift , very straight lumbar region L 1 to S1.  Minimal/can't muscle mass B gluteals   PALPATION: Non tender spinous processes lumbar region, paraspinals, quadratus   LUMBAR ROM:    AROM eval  Flexion 75  Extension 45  Right lateral flexion 35  Left lateral flexion 35  Right rotation    Left  rotation     (Blank rows = not tested)   LOWER EXTREMITY ROM:   all hip, knee, ankle AROM and PROM wnl B   LOWER EXTREMITY MMT:     MMT Right eval Left eval  Hip flexion 4 4  Hip extension 4- 4-  Hip abduction 3+ 3+  Hip adduction      Hip internal rotation  Hip external rotation      Knee flexion      Knee extension 4+ 4+  Ankle dorsiflexion 4 4  Ankle plantarflexion 4 4  Ankle inversion      Ankle eversion       (Blank rows = not tested)   LUMBAR SPECIAL TESTS:  Straight leg raise test: Negative   FUNCTIONAL TESTS:  30 seconds chair stand test 15 reps 6 min walk test  GAIT: Distance walked: 1200' Assistive device utilized: None Level of assistance: SBA Comments: gait with pronounced B decreased step length, frequent ataxia L LE, tends to plant B forefeet, no heel strike B   TODAY'S TREATMENT:                                                                                                                              DATE: 03/27/23  Eval, instructed in blue t band ankle pumps and seated hip abd/ER    PATIENT EDUCATION:  Education details: POC goals Person educated: Patient Education method: Programmer, multimedia, Demonstration, Tactile cues, Verbal cues, and Handouts Education comprehension: verbalized understanding   HOME EXERCISE PROGRAM: Seated blue t band hip abd and seated ankle pumps   ASSESSMENT:   CLINICAL IMPRESSION: Patient is a 72 y.o. male who was seen today for physical therapy evaluation and treatment for chronic lower back pain.  He has experienced an improvement in his pain over the last week since receiving an injection. His most remarkable deficit noted today is gait abnormality with discoordination and balance deficits. He also presents with diffuse LE weakness, most pronounced with MMT with B ankle dflexion and B hip abduction.  Needs additional balance testing, would recommend with FGA. Will benefit from skilled PT to address his deficits, particularly  strength B LE's   OBJECTIVE IMPAIRMENTS: Abnormal gait, decreased activity tolerance, decreased balance, and decreased strength.    ACTIVITY LIMITATIONS: carrying, lifting, bending, squatting, stairs, and locomotion level   PARTICIPATION LIMITATIONS: cleaning, laundry, shopping, community activity, and yard work   PERSONAL FACTORS: Age, Fitness, Time since onset of injury/illness/exacerbation, and 1-2 comorbidities: chronic  lymphocytic leukemia, anxiety  are also affecting patient's functional outcome.    REHAB POTENTIAL: Good   CLINICAL DECISION MAKING: Stable/uncomplicated   EVALUATION COMPLEXITY: Low     GOALS: Goals reviewed with patient? Yes   SHORT TERM GOALS: Target date: 2 weeks, 04/10/23   I HEP for  Baseline: Goal status: INITIAL     LONG TERM GOALS: Target date: 06/19/23   FGA 27/30 Baseline: needs assessment in next few visits Goal status: INITIAL   2.  30 sec sit to stand 17 reps Baseline: 15 reps Goal status: INITIAL   3.  Increase strength B hips, quads, plantar flexors to 4+/5 Baseline: 3+ to 4+/5 Goal status: INITIAL   4. Modified oswestry increase to 30/50 Baseline: 20/50 Goal status: initial PLAN:   PT FREQUENCY: 2x/week   PT DURATION: 8 weeks   PLANNED INTERVENTIONS:  Therapeutic exercises, Therapeutic activity, Neuromuscular re-education, Balance training, Gait training, Patient/Family education, Self Care, and Joint mobilization.   PLAN FOR NEXT SESSION: progress with FGA, strengthening on equipment and on mat for hips.     Oletha Tolson L Holli Rengel, PT 03/27/2023, 5:58 PM

## 2023-03-27 NOTE — Evaluation (Signed)
OUTPATIENT PHYSICAL THERAPY THORACOLUMBAR EVALUATION     Patient Name: Bobby [name] MRN: [id number] DOB:11/13/1951, 71 y.o., male Today's Date: 03/27/2023   END OF SESSION:   PT End of Session - 03/27/23 1318       Visit Number 1     Date for PT Re-Evaluation 06/19/23     Progress Note Due on Visit 10     PT Start Time 1318     PT Stop Time 1400     PT Time Calculation (min) 42 min     Activity Tolerance Patient tolerated treatment well     Behavior During Therapy WFL for tasks assessed/performed                       Past Medical History:  Diagnosis Date   Anxiety and depression     Hypertension     Hyponatremia 03/2013    ongoing, seeing Dr. Patel   Insomnia     Melanoma in situ of back (HCC)      biopsy on 03/05/2020   SIADH (syndrome of inappropriate ADH production) (HCC) 2017    Dr. Patel   Squamous cell carcinoma in situ           Past Surgical History:  Procedure Laterality Date   BRONCHIAL BIOPSY   11/27/2022    Procedure: BRONCHIAL BIOPSIES;  Surgeon: Byrum, Robert S, MD;  Location: [name];  Service: Cardiopulmonary;;   BRONCHIAL BRUSHINGS   11/27/2022    Procedure: BRONCHIAL BRUSHINGS;  Surgeon: Byrum, Robert S, MD;  Location: [name];  Service: Cardiopulmonary;;   BRONCHIAL WASHINGS   11/27/2022    Procedure: BRONCHIAL WASHINGS;  Surgeon: Byrum, Robert S, MD;  Location: [name];  Service: Cardiopulmonary;;   MOHS SURGERY   ~ 04/2020    face, [facility]   SHOULDER SURGERY   1980s    right shoulder   VIDEO BRONCHOSCOPY N/A 11/27/2022    Procedure: VIDEO BRONCHOSCOPY WITH FLUORO;  Surgeon: Byrum, Robert S, MD;  Location: [name];  Service: Cardiopulmonary;  Laterality: N/A;        Patient Active Problem List    Diagnosis Date Noted   CLL (chronic lymphocytic leukemia) (HCC) 02/21/2023   Abnormal CT of the chest 11/01/2022   Melanoma in situ of back (HCC) 03/16/2020   Squamous cell carcinoma in situ 08/21/2019   Vitamin D deficiency  05/29/2019   Cough 01/01/2016   PCP NOTES >>>> 09/28/2015   Hypogonadism in male 05/13/2015   SIADH (syndrome of inappropriate ADH production) (HCC) 04/13/2013   Memory problem 04/13/2013   Annual physical exam 03/06/2012   THORACOLUMBAR SCOLIOSIS, MILD 08/18/2010   BACK PAIN, LUMBAR 07/21/2010   Anxiety state 04/01/2009   Essential hypertension, benign 01/04/2009   INSOMNIA-SLEEP DISORDER-UNSPEC 09/08/2008      PCP: Paz, Jose MD   REFERRING PROVIDER: Dumonski, Mark, MD     REFERRING DIAG: chronic LBP   Rationale for Evaluation and Treatment: Rehabilitation   THERAPY DIAG:  Spinal stenosis of lumbar region with neurogenic claudication   Other abnormalities of gait and mobility   ONSET DATE: x 3 years 2021   SUBJECTIVE:                                                                                                                                                                                              SUBJECTIVE STATEMENT: Walking I a problem, getting out of bed hurts a lot. Was working out in my home gym 3 x week, lifting dead weights.  Bench press, curl/ used road bike for cardio, has started about 2 week ago.     PERTINENT HISTORY:  H/o of 3 yrs of lower back pain which affects his gait.  Had a shot the other day. 1 week ago,  which helped for 2 to 3 days     PAIN:  Are you having pain? Yes: NPRS scale: 0/10 Pain location: central lower back L4-S1 region pointed to by patient Pain description: much better with shot Aggravating factors: getting up in am  Relieving factors: take aspirin   PRECAUTIONS: None   WEIGHT BEARING RESTRICTIONS: No   FALLS:  Has patient fallen in last 6 months? No   LIVING ENVIRONMENT: Lives with: lives with their spouse Lives in: House/apartment Stairs: Yes: Internal: 11 steps; can reach both and External: 5 steps; on right going up Has following equipment at home: None   OCCUPATION: works at desk, on computer most of the day    PLOF: Independent   PATIENT GOALS: get my back strong while I am not hurting from the shot   NEXT MD VISIT: approximately 6 weeks   OBJECTIVE:  MRI from 03/14/23: DIAGNOSTIC FINDINGS:  IMPRESSION: 1. At L4-5 there is a broad-based disc bulge. Severe bilateral facet arthropathy and ligamentum flavum infolding. Severe spinal stenosis. No left foraminal stenosis. Severe right foraminal stenosis. 2. At L5-S1 there is a broad-based disc osteophyte complex. Moderate bilateral facet arthropathy with a small right facet effusion. Severe left foraminal stenosis. Mild right foraminal stenosis. 3. At L3-4 there is a broad-based disc bulge. Moderate bilateral facet arthropathy. Mild spinal stenosis. Moderate right and mild left foraminal stenosis. 4. No acute osseous injury of the lumbar spine.   IMPRESSION: 1. At T1-2 there is a minimal broad-based disc osteophyte complex. Moderate-severe bilateral foraminal stenosis. 2. No acute osseous injury of the thoracic spine. 3. Partially visualized cervical spine spondylosis. If there is further clinical concern, recommend a dedicated MRI of the cervical spine.       PATIENT SURVEYS:  Modified Oswestry 20/50    SCREENING FOR RED FLAGS: Bowel or bladder incontinence: No Spinal tumors: No Cauda equina syndrome: No Compression fracture: No Abdominal aneurysm: No   COGNITION: Overall cognitive status: Within functional limits for tasks assessed                          SENSATION: WFL   MUSCLE LENGTH: Hamstrings: Right 35 deg; Left 35 deg Thomas test: Right wnl deg; Left wnl deg   POSTURE: decreased lumbar lordosis and R lateral pelvic shift , very straight lumbar region L 1 to S1.  Minimal/can't muscle mass B gluteals   PALPATION: Non tender spinous processes lumbar region, paraspinals, quadratus   LUMBAR ROM:    AROM eval  Flexion 75  Extension 45  Right lateral flexion 35  Left lateral flexion 35  Right rotation    Left  rotation     (Blank rows = not tested)   LOWER EXTREMITY ROM:   all hip, knee, ankle AROM and PROM wnl B   LOWER EXTREMITY MMT:     MMT Right eval Left eval  Hip flexion 4 4  Hip extension 4- 4-  Hip abduction 3+ 3+  Hip adduction      Hip internal rotation        Hip external rotation      Knee flexion      Knee extension 4+ 4+  Ankle dorsiflexion 4 4  Ankle plantarflexion 4 4  Ankle inversion      Ankle eversion       (Blank rows = not tested)   LUMBAR SPECIAL TESTS:  Straight leg raise test: Negative   FUNCTIONAL TESTS:  30 seconds chair stand test 15 reps 6 min walk test  GAIT: Distance walked: 1200' Assistive device utilized: None Level of assistance: SBA Comments: gait with pronounced B decreased step length, frequent ataxia L LE, tends to plant B forefeet, no heel strike B   TODAY'S TREATMENT:                                                                                                                              DATE: 03/27/23  Eval, instructed in blue t band ankle pumps and seated hip abd/ER    PATIENT EDUCATION:  Education details: POC goals Person educated: Patient Education method: Explanation, Demonstration, Tactile cues, Verbal cues, and Handouts Education comprehension: verbalized understanding   HOME EXERCISE PROGRAM: Seated blue t band hip abd and seated ankle pumps   ASSESSMENT:   CLINICAL IMPRESSION: Patient is a 71 y.o. male who was seen today for physical therapy evaluation and treatment for chronic lower back pain.  He has experienced an improvement in his pain over the last week since receiving an injection. His most remarkable deficit noted today is gait abnormality with discoordination and balance deficits. He also presents with diffuse LE weakness, most pronounced with MMT with B ankle dflexion and B hip abduction.  Needs additional balance testing, would recommend with FGA. Will benefit from skilled PT to address his deficits, particularly  strength B LE's   OBJECTIVE IMPAIRMENTS: Abnormal gait, decreased activity tolerance, decreased balance, and decreased strength.    ACTIVITY LIMITATIONS: carrying, lifting, bending, squatting, stairs, and locomotion level   PARTICIPATION LIMITATIONS: cleaning, laundry, shopping, community activity, and yard work   PERSONAL FACTORS: Age, Fitness, Time since onset of injury/illness/exacerbation, and 1-2 comorbidities: chronic  lymphocytic leukemia, anxiety  are also affecting patient's functional outcome.    REHAB POTENTIAL: Good   CLINICAL DECISION MAKING: Stable/uncomplicated   EVALUATION COMPLEXITY: Low     GOALS: Goals reviewed with patient? Yes   SHORT TERM GOALS: Target date: 2 weeks, 04/10/23   I HEP for  Baseline: Goal status: INITIAL     LONG TERM GOALS: Target date: 06/19/23   FGA 27/30 Baseline: needs assessment in next few visits Goal status: INITIAL   2.  30 sec sit to stand 17 reps Baseline: 15 reps Goal status: INITIAL   3.  Increase strength B hips, quads, plantar flexors to 4+/5 Baseline: 3+ to 4+/5 Goal status: INITIAL   4. Modified oswestry increase to 30/50 Baseline: 20/50 Goal status: initial PLAN:   PT FREQUENCY: 2x/week   PT DURATION: 8 weeks   PLANNED INTERVENTIONS:

## 2023-03-29 ENCOUNTER — Other Ambulatory Visit: Payer: Self-pay | Admitting: Internal Medicine

## 2023-03-30 ENCOUNTER — Ambulatory Visit: Payer: Medicare HMO | Admitting: Physical Therapy

## 2023-03-30 DIAGNOSIS — M5459 Other low back pain: Secondary | ICD-10-CM | POA: Diagnosis not present

## 2023-03-30 DIAGNOSIS — R2689 Other abnormalities of gait and mobility: Secondary | ICD-10-CM

## 2023-03-30 DIAGNOSIS — M48062 Spinal stenosis, lumbar region with neurogenic claudication: Secondary | ICD-10-CM | POA: Diagnosis not present

## 2023-03-30 NOTE — Therapy (Signed)
OUTPATIENT PHYSICAL THERAPY TREATMENT  Patient Name: Bobby [name] MRN: [id number] DOB:June 29, 1951, 72 y.o., male Today's Date: 03/30/2023  END OF SESSION:  PT End of Session - 03/30/23 0936     Visit Number 2    Date for PT Re-Evaluation 06/19/23    Progress Note Due on Visit 10    PT Start Time 0936    PT Stop Time 1024    PT Time Calculation (min) 48 min    Activity Tolerance Patient tolerated treatment well    Behavior During Therapy [facility] for tasks assessed/performed             Past Medical History:  Diagnosis Date   Anxiety and depression    Hypertension    Hyponatremia 03/2013   ongoing, seeing Dr. Allena Katz   Insomnia    Melanoma in situ of back [facility])    biopsy on 03/05/2020   SIADH (syndrome of inappropriate ADH production) (HCC) 2017   Dr. Allena Katz   Squamous cell carcinoma in situ    Past Surgical History:  Procedure Laterality Date   BRONCHIAL BIOPSY  11/27/2022   Procedure: BRONCHIAL BIOPSIES;  Surgeon: Leslye Peer, MD;  Location: [facility] ENDOSCOPY;  Service: Cardiopulmonary;;   BRONCHIAL BRUSHINGS  11/27/2022   Procedure: BRONCHIAL BRUSHINGS;  Surgeon: Leslye Peer, MD;  Location: [facility] ENDOSCOPY;  Service: Cardiopulmonary;;   BRONCHIAL WASHINGS  11/27/2022   Procedure: BRONCHIAL WASHINGS;  Surgeon: Leslye Peer, MD;  Location: [facility] ENDOSCOPY;  Service: Cardiopulmonary;;   MOHS SURGERY  ~ 04/2020   face, [facility]   SHOULDER SURGERY  1980s   right shoulder   VIDEO BRONCHOSCOPY N/A 11/27/2022   Procedure: VIDEO BRONCHOSCOPY WITH FLUORO;  Surgeon: Leslye Peer, MD;  Location[facility] ENDOSCOPY;  Service: Cardiopulmonary;  Laterality: N/A;   Patient Active Problem List   Diagnosis Date Noted   CLL (chronic lymphocytic leukemia) (HCC) 02/21/2023   Abnormal CT of the chest 11/01/2022   Melanoma in situ of back (HCC) 03/16/2020   Squamous cell carcinoma in situ 08/21/2019   Vitamin D deficiency 05/29/2019   Cough 01/01/2016   PCP NOTES >>>> 09/28/2015   Hypogonadism in  male 05/13/2015   SIADH (syndrome of inappropriate ADH production) (HCC) 04/13/2013   Memory problem 04/13/2013   Annual physical exam 03/06/2012   THORACOLUMBAR SCOLIOSIS, MILD 08/18/2010   BACK PAIN, LUMBAR 07/21/2010   Anxiety state 04/01/2009   Essential hypertension, benign 01/04/2009   INSOMNIA-SLEEP DISORDER-UNSPEC 09/08/2008    PCP: Wanda Plump, MD   REFERRING PROVIDER: Estill Bamberg, MD   REFERRING DIAG: M54.50 (ICD-10-CM) - Low back pain, unspecified   THERAPY DIAG:  Other low back pain  Other abnormalities of gait and mobility  RATIONALE FOR EVALUATION AND TREATMENT: Rehabilitation  ONSET DATE: x 3 years - 2021  NEXT MD VISIT: ~6 weeks   SUBJECTIVE:  SUBJECTIVE STATEMENT: Patient reports pain remains mild, difficulty identifying things that exacerbate pain currently.  PAIN: Are you having pain? Yes: NPRS scale: 2/10 Pain location: central lower back L4-S1 region pointed to by patient Pain description: mild Aggravating factors: curling weights Relieving factors: ibuprofen, riding bike  PERTINENT HISTORY:  H/o of 3 yrs of lower back pain which affects his gait. Had a shot the other day - 1 week ago, which helped for 2 to 3 days. PMH: Chronic LBP, thoracolumbar scoliosis, hypertension, CLL (chronic lymphocytic leukemia), melanoma - back, anxiety, depression, insomnia  PRECAUTIONS: None  WEIGHT BEARING RESTRICTIONS: No  FALLS:  Has patient fallen in last 6 months? No  LIVING ENVIRONMENT: Lives with: lives with their spouse Lives in: House/apartment Stairs: Yes: Internal: 11 steps; can reach both and External: 5 steps; on right going up Has following equipment at home: None  OCCUPATION: works at desk, on computer most of the day   PLOF:  Independent  PATIENT GOALS: "get my back strong while I am not hurting from the shot "   OBJECTIVE: (objective measures completed at initial evaluation unless otherwise dated)  DIAGNOSTIC FINDINGS:  02/07/23 - Lumbar spine MRI: IMPRESSION: At L4-5 there is a broad-based disc bulge. Severe bilateral facet arthropathy and ligamentum flavum infolding. Severe spinal stenosis. No left foraminal stenosis. Severe right foraminal stenosis. At L5-S1 there is a broad-based disc osteophyte complex. Moderate bilateral facet arthropathy with a small right facet effusion. Severe left foraminal stenosis. Mild right foraminal stenosis. At L3-4 there is a broad-based disc bulge. Moderate bilateral facet arthropathy. Mild spinal stenosis. Moderate right and mild left foraminal stenosis. No acute osseous injury of the lumbar spine.  02/07/23 - Thoracic spine MRI: IMPRESSION: At T1-2 there is a minimal broad-based disc osteophyte complex.  Moderate-severe bilateral foraminal stenosis. No acute osseous injury of the thoracic spine. Partially visualized cervical spine spondylosis. If there is further clinical concern, recommend a dedicated MRI of the cervical spine.  PATIENT SURVEYS:  Modified Oswestry 20/50   SCREENING FOR RED FLAGS: Bowel or bladder incontinence: No Spinal tumors: No Cauda equina syndrome: No Compression fracture: No Abdominal aneurysm: No  COGNITION:  Overall cognitive status: Within functional limits for tasks assessed    SENSATION: WFL  MUSCLE LENGTH: Hamstrings: Right 35 deg; Left 35 deg Thomas test: Right WNL deg; Left WNL deg  POSTURE:  decreased lumbar lordosis and R lateral pelvic shift , very straight lumbar region L 1 to S1.  Minimal/can't muscle mass B gluteals  PALPATION: Non tender spinous processes lumbar region, paraspinals, quadratus   LUMBAR ROM:   Active  eval  Flexion 75  Extension 45  Right lateral flexion 35  Left lateral flexion 35  Right  rotation   Left rotation   (Blank rows = not tested)  LOWER EXTREMITY ROM:    all hip, knee, ankle AROM and PROM wnl B   LOWER EXTREMITY MMT:    MMT Right eval Left eval  Hip flexion 4 4  Hip extension 4- 4-  Hip abduction 3+ 3+  Hip adduction    Hip internal rotation    Hip external rotation    Knee flexion    Knee extension 4+ 4+  Ankle dorsiflexion 4 4  Ankle plantarflexion 4 4  Ankle inversion    Ankle eversion     (Blank rows = not tested)  LUMBAR SPECIAL TESTS:  Straight leg raise test: Negative  FUNCTIONAL TESTS:  30 seconds chair stand test 15 reps 6 min walk  test  GAIT: Distance walked: 1200' Assistive device utilized: None Level of assistance: SBA Comments: gait with pronounced B decreased step length, frequent ataxia L LE, tends to plant B forefeet, no heel strike B  FUNCTIONAL TESTS: (03/30/23) 5 times sit to stand: 10.31 sec Timed up and go (TUG): 9.66 sec 10 meter walk test: 8.75 sec Functional gait assessment: 20/30; 19-24 = medium risk fall    TODAY'S TREATMENT:   03/30/23 THERAPEUTIC ACTIVITIES: 5 times sit to stand: 10.31 sec Timed up and go (TUG): 9.66 sec 10 meter walk test: 8.75 sec Functional gait assessment: 20/30; 19-24 = medium risk fall  THERAPEUTIC EXERCISE: to improve flexibility, strength and mobility.  Verbal and tactile cues throughout for technique.  TrA isometric/PPT 10 x 5" TrA + alt blue TB bent-knee fallout 10 x 5" Bridge + blue TB hip ABD isometric 10 x 5" Hooklying TrA + sequential march x 10   03/27/23  Eval, instructed in blue t band ankle pumps and seated hip abd/ER    PATIENT EDUCATION:  Education details: PT eval findings and initial HEP Person educated: Patient Education method: Explanation, Demonstration, Verbal cues, Handouts, and MedBridgeGO access code provided Education comprehension: verbalized understanding, returned demonstration, verbal cues required, and needs further education  HOME EXERCISE  PROGRAM: Seated blue t band hip abd and seated ankle pumps   Access Code: [email or web address] URL: [email or web address] Date: 03/30/2023 Prepared by: Glenetta Hew  Exercises - Hooklying Hamstring Stretch with Strap  - 1-2 x daily - 7 x weekly - 3 reps - 30 sec hold - Supine Posterior Pelvic Tilt  - 1-2 x daily - 7 x weekly - 2 sets - 10 reps - 5 sec hold - Hooklying Single Leg Bent Knee Fallouts with Resistance  - 1-2 x daily - 7 x weekly - 2 sets - 10 reps - 3 sec hold - Supine Bridge with Resistance Band  - 1-2 x daily - 7 x weekly - 2 sets - 10 reps - 5 sec hold - Hooklying Sequential Leg March and Lower  - 1-2 x daily - 7 x weekly - 2 sets - 10 reps - 3 sec hold   ASSESSMENT:  CLINICAL IMPRESSION: Completed balance assessment including FGA with score of 20/30 indicating a medium risk for falls.  Initiated core and lumbopelvic strengthening with emphasis on hip musculature.  Cues necessary for proper technique and pacing as well as avoidance of holding his breath, however Thelonious Oglesbee" able to perform good return demonstration of each exercise.  HEP instructions provided including information on how to access MedBridgeGO for videos as needed if questions arise with home performance.  OBJECTIVE IMPAIRMENTS: Abnormal gait, decreased activity tolerance, decreased balance, decreased knowledge of condition, decreased mobility, decreased strength, impaired flexibility, improper body mechanics, postural dysfunction, and pain.   ACTIVITY LIMITATIONS: carrying, lifting, bending, squatting, stairs, and locomotion level  PARTICIPATION LIMITATIONS: cleaning, laundry, shopping, community activity, and yard work  PERSONAL FACTORS: Age, Fitness, Time since onset of injury/illness/exacerbation, and 1-2 comorbidities: chronic lymphocytic leukemia, anxiety  are also affecting patient's functional outcome.   REHAB POTENTIAL: Good  CLINICAL DECISION MAKING: Stable/uncomplicated  EVALUATION  COMPLEXITY: Low   GOALS: Goals reviewed with patient? Yes  SHORT TERM GOALS: Target date: 2 weeks, 04/10/23   I HEP for  Baseline:  Goal status: IN PROGRESS  LONG TERM GOALS: Target date: 06/19/23   FGA 27/30   Baseline: 20/30 Goal status: IN PROGRESS  2.  30 sec sit to stand 17 reps .  Baseline: 15 reps Goal status: IN PROGRESS  3.  Increase strength B hips, quads, plantar flexors to 4+/5  Baseline: 3+ to 4+/5 Goal status: IN PROGRESS  4.  Modified oswestry increase to 30/50  Baseline: 20/50 Goal status: IN PROGRESS   PLAN:  PT FREQUENCY: 2x/week  PT DURATION: 8 weeks  PLANNED INTERVENTIONS: Therapeutic exercises, Therapeutic activity, Neuromuscular re-education, Balance training, Gait training, Patient/Family education, Self Care, and Joint mobilization  PLAN FOR NEXT SESSION: strengthening on equipment and on mat for hips    Marry Guan, PT 03/30/2023, 10:47 AM

## 2023-04-03 ENCOUNTER — Other Ambulatory Visit: Payer: Self-pay

## 2023-04-03 ENCOUNTER — Ambulatory Visit: Payer: BLUE CROSS/BLUE SHIELD

## 2023-04-03 DIAGNOSIS — M5459 Other low back pain: Secondary | ICD-10-CM

## 2023-04-03 DIAGNOSIS — R2689 Other abnormalities of gait and mobility: Secondary | ICD-10-CM | POA: Diagnosis not present

## 2023-04-03 DIAGNOSIS — M48062 Spinal stenosis, lumbar region with neurogenic claudication: Secondary | ICD-10-CM | POA: Diagnosis not present

## 2023-04-03 NOTE — Therapy (Signed)
OUTPATIENT PHYSICAL THERAPY TREATMENT  Patient Name: Bobby [name] MRN: [id number] DOB:[birth year], 72 y.o., male Today's Date: 04/03/2023  END OF SESSION:  PT End of Session - 04/03/23 1444     Visit Number 3    Date for PT Re-Evaluation 06/19/23    Progress Note Due on Visit 10    PT Start Time 1400    PT Stop Time 1443    PT Time Calculation (min) 43 min    Activity Tolerance Patient tolerated treatment well    Behavior During Therapy [facility] for tasks assessed/performed              Past Medical History:  Diagnosis Date   Anxiety and depression    Hypertension    Hyponatremia 03/2013   ongoing, seeing Dr. Allena Katz   Insomnia    Melanoma in situ of back [facility])    biopsy on 03/05/2020   SIADH (syndrome of inappropriate ADH production) (HCC) 2017   Dr. Allena Katz   Squamous cell carcinoma in situ    Past Surgical History:  Procedure Laterality Date   BRONCHIAL BIOPSY  11/27/2022   Procedure: BRONCHIAL BIOPSIES;  Surgeon: Leslye Peer, MD;  Location: [facility] ENDOSCOPY;  Service: Cardiopulmonary;;   BRONCHIAL BRUSHINGS  11/27/2022   Procedure: BRONCHIAL BRUSHINGS;  Surgeon: Leslye Peer, MD;  Location: [facility] ENDOSCOPY;  Service: Cardiopulmonary;;   BRONCHIAL WASHINGS  11/27/2022   Procedure: BRONCHIAL WASHINGS;  Surgeon: Leslye Peer, MD;  Location: [facility] ENDOSCOPY;  Service: Cardiopulmonary;;   MOHS SURGERY  ~ 04/2020   face, [facility]   SHOULDER SURGERY  1980s   right shoulder   VIDEO BRONCHOSCOPY N/A 11/27/2022   Procedure: VIDEO BRONCHOSCOPY WITH FLUORO;  Surgeon: Leslye Peer, MD;  Location: [facility] ENDOSCOPY;  Service: Cardiopulmonary;  Laterality: N/A;   Patient Active Problem List   Diagnosis Date Noted   CLL (chronic lymphocytic leukemia) (HCC) 02/21/2023   Abnormal CT of the chest 11/01/2022   Melanoma in situ of back (HCC) 03/16/2020   Squamous cell carcinoma in situ 08/21/2019   Vitamin D deficiency 05/29/2019   Cough 01/01/2016   PCP NOTES >>>> 09/28/2015   Hypogonadism  in male 05/13/2015   SIADH (syndrome of inappropriate ADH production) (HCC) 04/13/2013   Memory problem 04/13/2013   Annual physical exam 03/06/2012   THORACOLUMBAR SCOLIOSIS, MILD 08/18/2010   BACK PAIN, LUMBAR 07/21/2010   Anxiety state 04/01/2009   Essential hypertension, benign 01/04/2009   INSOMNIA-SLEEP DISORDER-UNSPEC 09/08/2008    PCP: Wanda Plump, MD   REFERRING PROVIDER: Estill Bamberg, MD   REFERRING DIAG: M54.50 (ICD-10-CM) - Low back pain, unspecified   THERAPY DIAG:  Other low back pain  Other abnormalities of gait and mobility  Spinal stenosis of lumbar region with neurogenic claudication  RATIONALE FOR EVALUATION AND TREATMENT: Rehabilitation  ONSET DATE: x 3 years - 2021  NEXT MD VISIT: ~6 weeks   SUBJECTIVE:  SUBJECTIVE STATEMENT: Patient reports pain remains mild, did fall at nursing home on high step that wasn't marked, while visiting his mother in law.   PAIN: Are you having pain? Yes: NPRS scale: 2/10 Pain location: central lower back L4-S1 region pointed to by patient Pain description: mild Aggravating factors: curling weights Relieving factors: ibuprofen, riding bike  PERTINENT HISTORY:  H/o of 3 yrs of lower back pain which affects his gait. Had a shot the other day - 1 week ago, which helped for 2 to 3 days. PMH: Chronic LBP, thoracolumbar scoliosis, hypertension, CLL (chronic lymphocytic leukemia), melanoma - back, anxiety, depression, insomnia  PRECAUTIONS: None  WEIGHT BEARING RESTRICTIONS: No  FALLS:  Has patient fallen in last 6 months? No  LIVING ENVIRONMENT: Lives with: lives with their spouse Lives in: House/apartment Stairs: Yes: Internal: 11 steps; can reach both and External: 5 steps; on right going up Has following equipment at  home: None  OCCUPATION: works at desk, on computer most of the day   PLOF: Independent  PATIENT GOALS: "get my back strong while I am not hurting from the shot "   OBJECTIVE: (objective measures completed at initial evaluation unless otherwise dated)  DIAGNOSTIC FINDINGS:  02/07/23 - Lumbar spine MRI: IMPRESSION: At L4-5 there is a broad-based disc bulge. Severe bilateral facet arthropathy and ligamentum flavum infolding. Severe spinal stenosis. No left foraminal stenosis. Severe right foraminal stenosis. At L5-S1 there is a broad-based disc osteophyte complex. Moderate bilateral facet arthropathy with a small right facet effusion. Severe left foraminal stenosis. Mild right foraminal stenosis. At L3-4 there is a broad-based disc bulge. Moderate bilateral facet arthropathy. Mild spinal stenosis. Moderate right and mild left foraminal stenosis. No acute osseous injury of the lumbar spine.  02/07/23 - Thoracic spine MRI: IMPRESSION: At T1-2 there is a minimal broad-based disc osteophyte complex.  Moderate-severe bilateral foraminal stenosis. No acute osseous injury of the thoracic spine. Partially visualized cervical spine spondylosis. If there is further clinical concern, recommend a dedicated MRI of the cervical spine.  PATIENT SURVEYS:  Modified Oswestry 20/50   SCREENING FOR RED FLAGS: Bowel or bladder incontinence: No Spinal tumors: No Cauda equina syndrome: No Compression fracture: No Abdominal aneurysm: No  COGNITION:  Overall cognitive status: Within functional limits for tasks assessed    SENSATION: WFL  MUSCLE LENGTH: Hamstrings: Right 35 deg; Left 35 deg Thomas test: Right WNL deg; Left WNL deg  POSTURE:  decreased lumbar lordosis and R lateral pelvic shift , very straight lumbar region L 1 to S1.  Minimal/can't muscle mass B gluteals  PALPATION: Non tender spinous processes lumbar region, paraspinals, quadratus   LUMBAR ROM:   Active  eval  Flexion 75   Extension 45  Right lateral flexion 35  Left lateral flexion 35  Right rotation   Left rotation   (Blank rows = not tested)  LOWER EXTREMITY ROM:    all hip, knee, ankle AROM and PROM wnl B   LOWER EXTREMITY MMT:    MMT Right eval Left eval  Hip flexion 4 4  Hip extension 4- 4-  Hip abduction 3+ 3+  Hip adduction    Hip internal rotation    Hip external rotation    Knee flexion    Knee extension 4+ 4+  Ankle dorsiflexion 4 4  Ankle plantarflexion 4 4  Ankle inversion    Ankle eversion     (Blank rows = not tested)  LUMBAR SPECIAL TESTS:  Straight leg raise test: Negative  FUNCTIONAL TESTS:  30 seconds chair stand test 15 reps 6 min walk test  GAIT: Distance walked: 1200' Assistive device utilized: None Level of assistance: SBA Comments: gait with pronounced B decreased step length, frequent ataxia L LE, tends to plant B forefeet, no heel strike B  FUNCTIONAL TESTS: (03/30/23) 5 times sit to stand: 10.31 sec Timed up and go (TUG): 9.66 sec 10 meter walk test: 8.75 sec Functional gait assessment: 20/30; 19-24 = medium risk fall    TODAY'S TREATMENT:   04/03/23: Therapeutic exercise: continued with therex progression to address LS spine stability and hip strength Walked in hall way, 3 laps, 900' for tissue perfusion, cardiovascular engagement Supine for pelvic tilts, 5 sec holds, 10 reps Bridging , with theraband around knees and sustained hip abd to engage proximal stabilizers Prone TKE's Prone alt hip and knee ext Quadriped bird dog Seated knee ext b  Seated hamstring curls B 35#, cues to reduce speed and control eccentrics Lat pulls 35#12 x 2 Seated rows 45# 12 x 2  03/30/23 THERAPEUTIC ACTIVITIES: 5 times sit to stand: 10.31 sec Timed up and go (TUG): 9.66 sec 10 meter walk test: 8.75 sec Functional gait assessment: 20/30; 19-24 = medium risk fall  THERAPEUTIC EXERCISE: to improve flexibility, strength and mobility.  Verbal and tactile cues  throughout for technique.  TrA isometric/PPT 10 x 5" TrA + alt blue TB bent-knee fallout 10 x 5" Bridge + blue TB hip ABD isometric 10 x 5" Hooklying TrA + sequential march x 10   03/27/23  Eval, instructed in blue t band ankle pumps and seated hip abd/ER    PATIENT EDUCATION:  Education details: PT eval findings and initial HEP Person educated: Patient Education method: Explanation, Demonstration, Verbal cues, Handouts, and MedBridgeGO access code provided Education comprehension: verbalized understanding, returned demonstration, verbal cues required, and needs further education  HOME EXERCISE PROGRAM:\ Access Code: [email or web address] URL: [email or web address] Date: 04/03/2023 Prepared by: Linton Rump Loucile Posner  Exercises - Hooklying Hamstring Stretch with Strap  - 1-2 x daily - 7 x weekly - 3 reps - 30 sec hold - Supine Posterior Pelvic Tilt  - 1-2 x daily - 7 x weekly - 2 sets - 10 reps - 5 sec hold - Hooklying Single Leg Bent Knee Fallouts with Resistance  - 1-2 x daily - 7 x weekly - 2 sets - 10 reps - 3 sec hold - Supine Bridge with Resistance Band  - 1-2 x daily - 7 x weekly - 2 sets - 10 reps - 5 sec hold - Hooklying Sequential Leg March and Lower  - 1-2 x daily - 7 x weekly - 2 sets - 10 reps - 3 sec hold - Bird Dog Progression  - 1 x daily - 7 x weekly - 3 sets - 10 reps Seated blue t band hip abd and seated ankle pumps   Access Code: [email or web address] URL: [email or web address] Date: 03/30/2023 Prepared by: Glenetta Hew  Exercises - Hooklying Hamstring Stretch with Strap  - 1-2 x daily - 7 x weekly - 3 reps - 30 sec hold - Supine Posterior Pelvic Tilt  - 1-2 x daily - 7 x weekly - 2 sets - 10 reps - 5 sec hold - Hooklying Single Leg Bent Knee Fallouts with Resistance  - 1-2 x daily - 7 x weekly - 2 sets - 10 reps - 3 sec hold - Supine Bridge with Resistance Band  - 1-2 x daily - 7 x weekly - 2 sets - 10 reps - 5 sec  hold - Hooklying Sequential Leg March and Lower  -  1-2 x daily - 7 x weekly - 2 sets - 10 reps - 3 sec hold   ASSESSMENT:  CLINICAL IMPRESSION: 3rd skilled PT visit to address gait impairment and LE and hip stability.  Progressed difficulty of some of the exercises today.  Noted more pronounced weakness R hip as compared to L today. Well motivated.  Continues to benefit from skilled PT to address his goals of improving overall strength. OBJECTIVE IMPAIRMENTS: Abnormal gait, decreased activity tolerance, decreased balance, decreased knowledge of condition, decreased mobility, decreased strength, impaired flexibility, improper body mechanics, postural dysfunction, and pain.   ACTIVITY LIMITATIONS: carrying, lifting, bending, squatting, stairs, and locomotion level  PARTICIPATION LIMITATIONS: cleaning, laundry, shopping, community activity, and yard work  PERSONAL FACTORS: Age, Fitness, Time since onset of injury/illness/exacerbation, and 1-2 comorbidities: chronic lymphocytic leukemia, anxiety  are also affecting patient's functional outcome.   REHAB POTENTIAL: Good  CLINICAL DECISION MAKING: Stable/uncomplicated  EVALUATION COMPLEXITY: Low   GOALS: Goals reviewed with patient? Yes  SHORT TERM GOALS: Target date: 2 weeks, 04/10/23   I HEP for  Baseline:  Goal status: IN PROGRESS  LONG TERM GOALS: Target date: 06/19/23   FGA 27/30   Baseline: 20/30 Goal status: IN PROGRESS  2.  30 sec sit to stand 17 reps .  Baseline: 15 reps Goal status: IN PROGRESS  3.  Increase strength B hips, quads, plantar flexors to 4+/5  Baseline: 3+ to 4+/5 Goal status: IN PROGRESS  4.  Modified oswestry increase to 30/50  Baseline: 20/50 Goal status: IN PROGRESS   PLAN:  PT FREQUENCY: 2x/week  PT DURATION: 8 weeks  PLANNED INTERVENTIONS: Therapeutic exercises, Therapeutic activity, Neuromuscular re-education, Balance training, Gait training, Patient/Family education, Self Care, and Joint mobilization  PLAN FOR NEXT SESSION: strengthening  on equipment and on mat for hips    Joletta Manner L Cledith Abdou, PT 04/03/2023, 5:13 PM

## 2023-04-04 ENCOUNTER — Ambulatory Visit (HOSPITAL_BASED_OUTPATIENT_CLINIC_OR_DEPARTMENT_OTHER)
Admission: RE | Admit: 2023-04-04 | Discharge: 2023-04-04 | Disposition: A | Discharge status: Home / Self Care | Payer: Medicare HMO | Source: Ambulatory Visit | Attending: Emergency Medicine | Admitting: Emergency Medicine

## 2023-04-04 DIAGNOSIS — R9389 Abnormal findings on diagnostic imaging of other specified body structures: Secondary | ICD-10-CM | POA: Insufficient documentation

## 2023-04-04 DIAGNOSIS — J479 Bronchiectasis, uncomplicated: Secondary | ICD-10-CM | POA: Diagnosis not present

## 2023-04-04 DIAGNOSIS — R918 Other nonspecific abnormal finding of lung field: Secondary | ICD-10-CM | POA: Diagnosis not present

## 2023-04-05 ENCOUNTER — Other Ambulatory Visit: Payer: Self-pay

## 2023-04-05 ENCOUNTER — Ambulatory Visit: Payer: Medicare HMO

## 2023-04-05 DIAGNOSIS — R2689 Other abnormalities of gait and mobility: Secondary | ICD-10-CM

## 2023-04-05 DIAGNOSIS — M48062 Spinal stenosis, lumbar region with neurogenic claudication: Secondary | ICD-10-CM

## 2023-04-05 DIAGNOSIS — M5459 Other low back pain: Secondary | ICD-10-CM | POA: Diagnosis not present

## 2023-04-05 NOTE — Therapy (Signed)
OUTPATIENT PHYSICAL THERAPY TREATMENT  Patient Name: Bobby [name] MRN: [id number] DOB:06/18/1951, 72 y.o., male Today's Date: 04/05/2023  END OF SESSION:  PT End of Session - 04/05/23 1020     Visit Number 4    Date for PT Re-Evaluation 06/19/23    PT Start Time 1015    PT Stop Time 1100    PT Time Calculation (min) 45 min    Activity Tolerance Patient tolerated treatment well    Behavior During Therapy [facility] for tasks assessed/performed               Past Medical History:  Diagnosis Date   Anxiety and depression    Hypertension    Hyponatremia 03/2013   ongoing, seeing Dr. Allena Katz   Insomnia    Melanoma in situ of back [facility])    biopsy on 03/05/2020   SIADH (syndrome of inappropriate ADH production) (HCC) 2017   Dr. Allena Katz   Squamous cell carcinoma in situ    Past Surgical History:  Procedure Laterality Date   BRONCHIAL BIOPSY  11/27/2022   Procedure: BRONCHIAL BIOPSIES;  Surgeon: Leslye Peer, MD;  Location: [facility] ENDOSCOPY;  Service: Cardiopulmonary;;   BRONCHIAL BRUSHINGS  11/27/2022   Procedure: BRONCHIAL BRUSHINGS;  Surgeon: Leslye Peer, MD;  Location: [facility] ENDOSCOPY;  Service: Cardiopulmonary;;   BRONCHIAL WASHINGS  11/27/2022   Procedure: BRONCHIAL WASHINGS;  Surgeon: Leslye Peer, MD;  Location: [facility] ENDOSCOPY;  Service: Cardiopulmonary;;   MOHS SURGERY  ~ 04/2020   face, [facility]   SHOULDER SURGERY  1980s   right shoulder   VIDEO BRONCHOSCOPY N/A 11/27/2022   Procedure: VIDEO BRONCHOSCOPY WITH FLUORO;  Surgeon: Leslye Peer, MD;  Location: [facility] ENDOSCOPY;  Service: Cardiopulmonary;  Laterality: N/A;   Patient Active Problem List   Diagnosis Date Noted   CLL (chronic lymphocytic leukemia) (HCC) 02/21/2023   Abnormal CT of the chest 11/01/2022   Melanoma in situ of back (HCC) 03/16/2020   Squamous cell carcinoma in situ 08/21/2019   Vitamin D deficiency 05/29/2019   Cough 01/01/2016   PCP NOTES >>>> 09/28/2015   Hypogonadism in male 05/13/2015   SIADH  (syndrome of inappropriate ADH production) (HCC) 04/13/2013   Memory problem 04/13/2013   Annual physical exam 03/06/2012   THORACOLUMBAR SCOLIOSIS, MILD 08/18/2010   BACK PAIN, LUMBAR 07/21/2010   Anxiety state 04/01/2009   Essential hypertension, benign 01/04/2009   INSOMNIA-SLEEP DISORDER-UNSPEC 09/08/2008    PCP: Wanda Plump, MD   REFERRING PROVIDER: Estill Bamberg, MD   REFERRING DIAG: M54.50 (ICD-10-CM) - Low back pain, unspecified   THERAPY DIAG:  No diagnosis found.  RATIONALE FOR EVALUATION AND TREATMENT: Rehabilitation  ONSET DATE: x 3 years - 2021  NEXT MD VISIT: ~6 weeks   SUBJECTIVE:  SUBJECTIVE STATEMENT: Patient reports mild pain R post hip/ SI jt after last session but not severe PAIN: Are you having pain? Yes: NPRS scale: 2/10 Pain location: central lower back L4-S1 region pointed to by patient Pain description: mild Aggravating factors: curling weights Relieving factors: ibuprofen, riding bike  PERTINENT HISTORY:  H/o of 3 yrs of lower back pain which affects his gait. Had a shot the other day - 1 week ago, which helped for 2 to 3 days. PMH: Chronic LBP, thoracolumbar scoliosis, hypertension, CLL (chronic lymphocytic leukemia), melanoma - back, anxiety, depression, insomnia  PRECAUTIONS: None  WEIGHT BEARING RESTRICTIONS: No  FALLS:  Has patient fallen in last 6 months? No  LIVING ENVIRONMENT: Lives with: lives with their spouse Lives in: House/apartment Stairs: Yes: Internal: 11 steps; can reach both and External: 5 steps; on right going up Has following equipment at home: None  OCCUPATION: works at desk, on computer most of the day   PLOF: Independent  PATIENT GOALS: "get my back strong while I am not hurting from the shot "   OBJECTIVE:  (objective measures completed at initial evaluation unless otherwise dated)  DIAGNOSTIC FINDINGS:  02/07/23 - Lumbar spine MRI: IMPRESSION: At L4-5 there is a broad-based disc bulge. Severe bilateral facet arthropathy and ligamentum flavum infolding. Severe spinal stenosis. No left foraminal stenosis. Severe right foraminal stenosis. At L5-S1 there is a broad-based disc osteophyte complex. Moderate bilateral facet arthropathy with a small right facet effusion. Severe left foraminal stenosis. Mild right foraminal stenosis. At L3-4 there is a broad-based disc bulge. Moderate bilateral facet arthropathy. Mild spinal stenosis. Moderate right and mild left foraminal stenosis. No acute osseous injury of the lumbar spine.  02/07/23 - Thoracic spine MRI: IMPRESSION: At T1-2 there is a minimal broad-based disc osteophyte complex.  Moderate-severe bilateral foraminal stenosis. No acute osseous injury of the thoracic spine. Partially visualized cervical spine spondylosis. If there is further clinical concern, recommend a dedicated MRI of the cervical spine.  PATIENT SURVEYS:  Modified Oswestry 20/50   SCREENING FOR RED FLAGS: Bowel or bladder incontinence: No Spinal tumors: No Cauda equina syndrome: No Compression fracture: No Abdominal aneurysm: No  COGNITION:  Overall cognitive status: Within functional limits for tasks assessed    SENSATION: WFL  MUSCLE LENGTH: Hamstrings: Right 35 deg; Left 35 deg Thomas test: Right WNL deg; Left WNL deg  POSTURE:  decreased lumbar lordosis and R lateral pelvic shift , very straight lumbar region L 1 to S1.  Minimal/can't muscle mass B gluteals  PALPATION: Non tender spinous processes lumbar region, paraspinals, quadratus   LUMBAR ROM:   Active  eval  Flexion 75  Extension 45  Right lateral flexion 35  Left lateral flexion 35  Right rotation   Left rotation   (Blank rows = not tested)  LOWER EXTREMITY ROM:    all hip, knee, ankle AROM and  PROM wnl B   LOWER EXTREMITY MMT:    MMT Right eval Left eval  Hip flexion 4 4  Hip extension 4- 4-  Hip abduction 3+ 3+  Hip adduction    Hip internal rotation    Hip external rotation    Knee flexion    Knee extension 4+ 4+  Ankle dorsiflexion 4 4  Ankle plantarflexion 4 4  Ankle inversion    Ankle eversion     (Blank rows = not tested)  LUMBAR SPECIAL TESTS:  Straight leg raise test: Negative  FUNCTIONAL TESTS:  30 seconds chair stand test 15 reps 6  min walk test  GAIT: Distance walked: 1200' Assistive device utilized: None Level of assistance: SBA Comments: gait with pronounced B decreased step length, frequent ataxia L LE, tends to plant B forefeet, no heel strike B  FUNCTIONAL TESTS: (03/30/23) 5 times sit to stand: 10.31 sec Timed up and go (TUG): 9.66 sec 10 meter walk test: 8.75 sec Functional gait assessment: 20/30; 19-24 = medium risk fall    TODAY'S TREATMENT:  04/05/23: Recumbent cycle 6 min level 6 Quadriped bird dog 15x 3 sec holds Prone ex for traps, scapular stabilizer engagement: rows 8# 15 reps Y's 2# 15 reps Shoulder extension, hand palm up 8# 15 reps Hamstring curls B 35#, 2 sets 15 reps cues to slow down for eccentric control Knee ext 45#, 3 sets 12 Lat pulls 45#, 2 sets 20 Standing hip abd alt with t band , blue 20x Standing alt hip ext with blue t band around thighs 20x  Ladder climb against wall, ipsilateral arm and leg, 15 x, lost balance to R  with weight bearing on R LE       04/03/23: Therapeutic exercise: continued with therex progression to address LS spine stability and hip strength Walked in hall way, 3 laps, 900' for tissue perfusion, cardiovascular engagement Supine for pelvic tilts, 5 sec holds, 10 reps Bridging , with theraband around knees and sustained hip abd to engage proximal stabilizers Prone TKE's Prone alt hip and knee ext Quadriped bird dog Seated knee ext b  Seated hamstring curls B 35#, cues to reduce  speed and control eccentrics Lat pulls 35#12 x 2 Seated rows 45# 12 x 2  03/30/23 THERAPEUTIC ACTIVITIES: 5 times sit to stand: 10.31 sec Timed up and go (TUG): 9.66 sec 10 meter walk test: 8.75 sec Functional gait assessment: 20/30; 19-24 = medium risk fall  THERAPEUTIC EXERCISE: to improve flexibility, strength and mobility.  Verbal and tactile cues throughout for technique.  TrA isometric/PPT 10 x 5" TrA + alt blue TB bent-knee fallout 10 x 5" Bridge + blue TB hip ABD isometric 10 x 5" Hooklying TrA + sequential march x 10   03/27/23  Eval, instructed in blue t band ankle pumps and seated hip abd/ER    PATIENT EDUCATION:  Education details: PT eval findings and initial HEP Person educated: Patient Education method: Explanation, Demonstration, Verbal cues, Handouts, and MedBridgeGO access code provided Education comprehension: verbalized understanding, returned demonstration, verbal cues required, and needs further education  HOME EXERCISE PROGRAM:\ Access Code: [email or web address] URL: [email or web address] Date: 04/03/2023 Prepared by: Linton Rump Roshni Burbano  Exercises - Hooklying Hamstring Stretch with Strap  - 1-2 x daily - 7 x weekly - 3 reps - 30 sec hold - Supine Posterior Pelvic Tilt  - 1-2 x daily - 7 x weekly - 2 sets - 10 reps - 5 sec hold - Hooklying Single Leg Bent Knee Fallouts with Resistance  - 1-2 x daily - 7 x weekly - 2 sets - 10 reps - 3 sec hold - Supine Bridge with Resistance Band  - 1-2 x daily - 7 x weekly - 2 sets - 10 reps - 5 sec hold - Hooklying Sequential Leg March and Lower  - 1-2 x daily - 7 x weekly - 2 sets - 10 reps - 3 sec hold - Bird Dog Progression  - 1 x daily - 7 x weekly - 3 sets - 10 reps Seated blue t band hip abd and seated ankle pumps   Access Code: [email or web address] URL: [email or web address] Date: 03/30/2023  Prepared by: Glenetta Hew  Exercises - Hooklying Hamstring Stretch with Strap  - 1-2 x daily - 7 x weekly - 3 reps - 30  sec hold - Supine Posterior Pelvic Tilt  - 1-2 x daily - 7 x weekly - 2 sets - 10 reps - 5 sec hold - Hooklying Single Leg Bent Knee Fallouts with Resistance  - 1-2 x daily - 7 x weekly - 2 sets - 10 reps - 3 sec hold - Supine Bridge with Resistance Band  - 1-2 x daily - 7 x weekly - 2 sets - 10 reps - 5 sec hold - Hooklying Sequential Leg March and Lower  - 1-2 x daily - 7 x weekly - 2 sets - 10 reps - 3 sec hold   ASSESSMENT:  CLINICAL IMPRESSION: 4th skilled PT visit to address gait impairment and LE and hip stability.  Again progressed difficulty of some of the exercises today.  Noted more pronounced weakness R hip as compared to L today, lost balance with R sided weight bearing activity at end of session.  Needs frequent cues to slow activities to engage control. Well motivated.  Continues to benefit from skilled PT to address his goals of improving overall strength. OBJECTIVE IMPAIRMENTS: Abnormal gait, decreased activity tolerance, decreased balance, decreased knowledge of condition, decreased mobility, decreased strength, impaired flexibility, improper body mechanics, postural dysfunction, and pain.   ACTIVITY LIMITATIONS: carrying, lifting, bending, squatting, stairs, and locomotion level  PARTICIPATION LIMITATIONS: cleaning, laundry, shopping, community activity, and yard work  PERSONAL FACTORS: Age, Fitness, Time since onset of injury/illness/exacerbation, and 1-2 comorbidities: chronic lymphocytic leukemia, anxiety  are also affecting patient's functional outcome.   REHAB POTENTIAL: Good  CLINICAL DECISION MAKING: Stable/uncomplicated  EVALUATION COMPLEXITY: Low   GOALS: Goals reviewed with patient? Yes  SHORT TERM GOALS: Target date: 2 weeks, 04/10/23   I HEP for  Baseline:  Goal status: IN PROGRESS  LONG TERM GOALS: Target date: 06/19/23   FGA 27/30   Baseline: 20/30 Goal status: IN PROGRESS  2.  30 sec sit to stand 17 reps .  Baseline: 15 reps Goal status: IN  PROGRESS  3.  Increase strength B hips, quads, plantar flexors to 4+/5  Baseline: 3+ to 4+/5 Goal status: IN PROGRESS  4.  Modified oswestry increase to 30/50  Baseline: 20/50 Goal status: IN PROGRESS   PLAN:  PT FREQUENCY: 2x/week  PT DURATION: 8 weeks  PLANNED INTERVENTIONS: Therapeutic exercises, Therapeutic activity, Neuromuscular re-education, Balance training, Gait training, Patient/Family education, Self Care, and Joint mobilization  PLAN FOR NEXT SESSION: strengthening on equipment and on mat for hips    Hiroto Saltzman L Tayshawn Purnell, PT 04/05/2023, 12:07 PM

## 2023-04-09 DIAGNOSIS — M5416 Radiculopathy, lumbar region: Secondary | ICD-10-CM | POA: Diagnosis not present

## 2023-04-10 ENCOUNTER — Ambulatory Visit: Payer: Medicare HMO | Admitting: Emergency Medicine

## 2023-04-10 ENCOUNTER — Encounter: Payer: Self-pay | Admitting: Emergency Medicine

## 2023-04-10 VITALS — BP 120/68 | HR 59 | Temp 97.6°F | Ht 72.0 in | Wt 181.6 lb

## 2023-04-10 DIAGNOSIS — M5440 Lumbago with sciatica, unspecified side: Secondary | ICD-10-CM | POA: Diagnosis not present

## 2023-04-10 DIAGNOSIS — R9389 Abnormal findings on diagnostic imaging of other specified body structures: Secondary | ICD-10-CM | POA: Diagnosis not present

## 2023-04-10 DIAGNOSIS — G8929 Other chronic pain: Secondary | ICD-10-CM | POA: Diagnosis not present

## 2023-04-10 NOTE — Assessment & Plan Note (Signed)
Being considered for possible lumbar surgery.  Discussed his pulmonary risk with him today.  He is a low risk patient.  No hesitation from pulmonary perspective if he elects to proceed.

## 2023-04-10 NOTE — Patient Instructions (Signed)
We reviewed your CT scan of the chest today. Will plan to repeat your CT in 6 months (November) to compare. Please call our office if you develop any new respiratory or constitutional symptoms: Cough, mucus production, fevers, chills, weight loss, sweats.  If so then we may decide to repeat your CT scan of the chest sooner. We reviewed your risk profile for possible planned lumbar thoracic surgery.  You are low risk for this procedure from a pulmonary standpoint.  No hesitation in proceeding as long as you continue to feel well. Follow Dr. Myna Hidalgo as planned Follow with Dr. Delton Coombes in 6 months after your CT so we can review.

## 2023-04-10 NOTE — Addendum Note (Signed)
Addended by: Marylynn Pearson on: 04/10/2023 09:35 AM   Modules accepted: Orders

## 2023-04-10 NOTE — Assessment & Plan Note (Signed)
Very subtle bronchiectatic change, micronodular disease and tree-in-bud pattern.  Has a waxing and waning component.  No significant progression.  Suspect he may be colonized with Mycobacterium although his bronchoscopy did not grow this out.  For now I am comfortable following him clinically and with repeat CT in 6 months.  We reviewed your CT scan of the chest today. Will plan to repeat your CT in 6 months (November) to compare. Please call our office if you develop any new respiratory or constitutional symptoms: Cough, mucus production, fevers, chills, weight loss, sweats.  If so then we may decide to repeat your CT scan of the chest sooner. We reviewed your risk profile for possible planned lumbar thoracic surgery.  You are low risk for this procedure from a pulmonary standpoint.  No hesitation in proceeding as long as you continue to feel well. Follow Dr. Myna Hidalgo as planned Follow with Dr. Delton Coombes in 6 months after your CT so we can review.

## 2023-04-10 NOTE — Progress Notes (Signed)
Subjective:    Patient ID: Bobby [name], male    DOB: Oct 29, 1951, 72 y.o.   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 Myna Hidalgo.  No evidence of hypogammaglobinemia per Dr. Gustavo Lah notes.  He also has a history of hypertension, SIADH with associated hyponatremia, melanoma in situ.  He is on Dupixent per dermatology.  He has undergone serial CT chest imaging with Dr. Myna Hidalgo, most recently performed 09/25/2022 to follow some subtle micronodular disease and airways inflammation noted on his CT 07/26/2022.  He is here today to discuss that study. Today he reports that he feels well. He deals w intermittent low back pain for several years. No constitutional sx. No cough.   CT chest 09/25/2022 reviewed by me, shows no mediastinal or hilar adenopathy.  Scattered peribronchovascular nodularity and some nodular consolidation with an area of linear consolidation in the right lower lobe that is new.  Overall mild progression compared with his previous scan in September 2023.   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 Myna Hidalgo.  I saw him after he had an abnormal CT chest in 09/2022 that showed some subtle micronodular disease and airways inflammation particularly in the right lower lobe, but notable in all lobes on the right.  Based on this we performed bronchoscopy 11/27/2022 with random transbronchial brushings and biopsies, BAL.  Cytology and pathology all negative for any malignancy or any evidence of granulomatous disease. Today he reports no respiratory symptoms. No fevers, wt loss.   Cell count WBC 181 (43% PMN, 41% lymphs, 16% mono) Fungal smears, AFB smear negative with final cultures pending.  Bacterial cultures are negative and final.   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  He has been followed by Dr. Myna Hidalgo.  He is a never smoker.  We have been following an abnormal CT scan of his chest, prompted bronchoscopy 11/27/2022.  Randon transbronchial biopsies were negative, no granulomas.  All culture data negative.  He reports today that he has been having back pain, is limited by this.  He is not having any breathing difficulty.  No fevers, chills, sweats, weight loss, cough or sputum.  His white blood cell count is [name] at 18.2  CT chest 04/04/2023 reviewed by me, shows [name] calcified left lower lobe granuloma, bilateral patchy mild to moderate cylindrical bronchiectatic changes with some associated tree-in-bud opacity.  Most prominent in the posterior right upper lobe and posterior lower lobes.  There is been some resolution of right lower lobe focus but other areas remain present, may be more prominent.   Review of Systems As per HPI  Past Medical History:  Diagnosis Date   Anxiety and depression    Hypertension    Hyponatremia 03/2013   ongoing, seeing Dr. Allena Katz   Insomnia    Melanoma in situ of back [facility])    biopsy on 03/05/2020   SIADH (syndrome of inappropriate ADH production) (HCC) 2017   Dr. Allena Katz   Squamous cell carcinoma in situ      Family History  Problem Relation Age of Onset   Hypertension Mother    Stroke Mother    Diabetes Mother    Emphysema Mother        smoked  Thyroid disease Mother    Alcoholism Father    Colon cancer Neg Hx    Prostate cancer Neg Hx    CAD Neg Hx      Social History   Socioeconomic History   Marital status: Married    Spouse name: Not on file   Number of children: 2   Years of education: 16   Highest education level: Bachelor's degree (e.g., BA, AB, BS)  Occupational History   Occupation: semi-retired--business owner   Tobacco Use   Smoking status: Never   Smokeless  tobacco: Never  Vaping Use   Vaping Use: Never used  Substance and Sexual Activity   Alcohol use: Yes    Comment: 1-2 qd   Drug use: No   Sexual activity: Yes  Other Topics Concern   Not on file  Social History Narrative   "Bobby [name]", married    2 children   mom passed away 2008/06/07  Right-handed.   2-3 cups coffee per day.   Lives at home with wife.            Social Determinants of Health   Financial Resource Strain: Low Risk  (08/10/2021)   Overall Financial Resource Strain (CARDIA)    Difficulty of Paying Living Expenses: Not hard at all  Food Insecurity: No Food Insecurity (08/10/2021)   Hunger Vital Sign    Worried About Running Out of Food in the Last Year: Never true    Ran Out of Food in the Last Year: Never true  Transportation Needs: No Transportation Needs (08/10/2021)   PRAPARE - Administrator, Civil Service (Medical): No    Lack of Transportation (Non-Medical): No  Physical Activity: Insufficiently Active (08/10/2021)   Exercise Vital Sign    Days of Exercise per Week: 3 days    Minutes of Exercise per Session: 40 min  Stress: No Stress Concern Present (08/10/2021)   [employer] of Occupational Health - Occupational Stress Questionnaire    Feeling of Stress : Only a little  Social Connections: Moderately Isolated (08/10/2021)   Social Connection and Isolation Panel [NHANES]    Frequency of Communication with Friends and Family: More than three times a week    Frequency of Social Gatherings with Friends and Family: More than three times a week    Attends Religious Services: Never    Database administrator or Organizations: No    Attends Banker Meetings: Never    Marital Status: Married  Catering manager Violence: Not At Risk (08/10/2021)   Humiliation, Afraid, Rape, and Kick questionnaire    Fear of Current or Ex-Partner: No    Emotionally Abused: No    Physically Abused: No    Sexually Abused: No     Allergies  Allergen  Reactions   Tramadol Hives     Outpatient Medications Prior to Visit  Medication Sig Dispense Refill   atorvastatin (LIPITOR) 20 MG tablet Take 1 tablet (20 mg total) by mouth at bedtime. 90 tablet 0   b complex vitamins capsule Take 1 capsule by mouth daily.     carvedilol (COREG) 6.25 MG tablet Take 1 tablet (6.25 mg total) by mouth 2 (two) times daily with a meal. 180 tablet 3   cholecalciferol (VITAMIN D3) 25 MCG (1000 UNIT) tablet Take 1,000 Units by mouth daily.     clonazePAM (KLONOPIN) 0.5 MG tablet TAKE ONE TABLET BY MOUTH TWICE A DAY AS NEEDED FOR ANXIETY 60 tablet 3   DUPIXENT  300 MG/2ML SOPN Inject into the skin every 14 (fourteen) days.     folic acid (FOLVITE) 1 MG tablet Take 1 tablet (1 mg total) by mouth daily. 90 tablet 2   Multiple Vitamin (MULTIVITAMIN WITH MINERALS) TABS Take 1 tablet by mouth daily.     niacinamide 500 MG tablet Take 500 mg by mouth 2 (two) times daily with a meal.     tadalafil (CIALIS) 10 MG tablet TAKE ONE TO TWO TABLETS BY MOUTH EVERY OTHER DAY AS NEEDED 20 tablet 3   thiamine 100 MG tablet Take 1 tablet (100 mg total) by mouth daily. 90 tablet 2   triamcinolone cream (KENALOG) 0.1 % SMARTSIG:1 Application Topical 2-3 Times Daily     zolpidem (AMBIEN) 10 MG tablet TAKE ONE TABLET BY MOUTH EVERY NIGHT AT BEDTIME AS NEEDED FOR SLEEP 90 tablet 1   No facility-administered medications prior to visit.        Objective:   Physical Exam Vitals:   04/10/23 0900  BP: 120/68  Pulse: (!) 59  Temp: 97.6 F (36.4 C)  TempSrc: Oral  SpO2: 98%  Weight: 181 lb 9.6 oz (82.4 kg)  Height: 6' (1.829 m)    Gen: Pleasant, well-nourished, in no distress,  normal affect  ENT: No lesions,  mouth clear,  oropharynx clear, no postnasal drip  Neck: No JVD, no stridor  Lungs: No use of accessory muscles, no crackles or wheezing on normal respiration, no wheeze on forced expiration  Cardiovascular: RRR, heart sounds normal, no murmur or gallops, no  peripheral edema  Musculoskeletal: No deformities, no cyanosis or clubbing  Neuro: alert, awake, non focal  Skin: Warm, no lesions or rash      Assessment & Plan:   Abnormal CT of the chest Very subtle bronchiectatic change, micronodular disease and tree-in-bud pattern.  Has a waxing and waning component.  No significant progression.  Suspect he may be colonized with Mycobacterium although his bronchoscopy did not grow this out.  For now I am comfortable following him clinically and with repeat CT in 6 months.  We reviewed your CT scan of the chest today. Will plan to repeat your CT in 6 months (November) to compare. Please call our office if you develop any new respiratory or constitutional symptoms: Cough, mucus production, fevers, chills, weight loss, sweats.  If so then we may decide to repeat your CT scan of the chest sooner. We reviewed your risk profile for possible planned lumbar thoracic surgery.  You are low risk for this procedure from a pulmonary standpoint.  No hesitation in proceeding as long as you continue to feel well. Follow Dr. Myna Hidalgo as planned Follow with Dr. Delton Coombes in 6 months after your CT so we can review.  BACK PAIN, LUMBAR Being considered for possible lumbar surgery.  Discussed his pulmonary risk with him today.  He is a low risk patient.  No hesitation from pulmonary perspective if he elects to proceed.   Levy Pupa, MD, PhD 04/10/2023, 9:30 AM [facility] [phone] or if no answer before 7:00PM call [phone] For any issues after 7:00PM please call eLink [phone]

## 2023-04-11 ENCOUNTER — Ambulatory Visit: Payer: Medicare HMO | Admitting: Physical Therapy

## 2023-04-11 ENCOUNTER — Encounter: Payer: Self-pay | Admitting: Physical Therapy

## 2023-04-11 DIAGNOSIS — M48062 Spinal stenosis, lumbar region with neurogenic claudication: Secondary | ICD-10-CM | POA: Diagnosis not present

## 2023-04-11 DIAGNOSIS — R2689 Other abnormalities of gait and mobility: Secondary | ICD-10-CM | POA: Diagnosis not present

## 2023-04-11 DIAGNOSIS — M5459 Other low back pain: Secondary | ICD-10-CM | POA: Diagnosis not present

## 2023-04-11 NOTE — Therapy (Signed)
OUTPATIENT PHYSICAL THERAPY TREATMENT  Patient Name: Bobby [name] MRN: [id number] DOB:August 02, 1951, 72 y.o., male Today's Date: 04/11/2023  END OF SESSION:  PT End of Session - 04/11/23 0851     Visit Number 5    Date for PT Re-Evaluation 06/19/23    PT Start Time 0851    PT Stop Time 0930    PT Time Calculation (min) 39 min    Activity Tolerance Patient tolerated treatment well    Behavior During Therapy [facility] for tasks assessed/performed               Past Medical History:  Diagnosis Date   Anxiety and depression    Hypertension    Hyponatremia 03/2013   ongoing, seeing Dr. Allena Katz   Insomnia    Melanoma in situ of back [facility])    biopsy on 03/05/2020   SIADH (syndrome of inappropriate ADH production) (HCC) 2017   Dr. Allena Katz   Squamous cell carcinoma in situ    Past Surgical History:  Procedure Laterality Date   BRONCHIAL BIOPSY  11/27/2022   Procedure: BRONCHIAL BIOPSIES;  Surgeon: Leslye Peer, MD;  Location: [facility] ENDOSCOPY;  Service: Cardiopulmonary;;   BRONCHIAL BRUSHINGS  11/27/2022   Procedure: BRONCHIAL BRUSHINGS;  Surgeon: Leslye Peer, MD;  Location: [facility] ENDOSCOPY;  Service: Cardiopulmonary;;   BRONCHIAL WASHINGS  11/27/2022   Procedure: BRONCHIAL WASHINGS;  Surgeon: Leslye Peer, MD;  Location: [facility] ENDOSCOPY;  Service: Cardiopulmonary;;   MOHS SURGERY  ~ 04/2020   face, [facility]   SHOULDER SURGERY  1980s   right shoulder   VIDEO BRONCHOSCOPY N/A 11/27/2022   Procedure: VIDEO BRONCHOSCOPY WITH FLUORO;  Surgeon: Leslye Peer, MD;  Location: [facility] ENDOSCOPY;  Service: Cardiopulmonary;  Laterality: N/A;   Patient Active Problem List   Diagnosis Date Noted   CLL (chronic lymphocytic leukemia) (HCC) 02/21/2023   Abnormal CT of the chest 11/01/2022   Melanoma in situ of back (HCC) 03/16/2020   Squamous cell carcinoma in situ 08/21/2019   Vitamin D deficiency 05/29/2019   Cough 01/01/2016   PCP NOTES >>>> 09/28/2015   Hypogonadism in male 05/13/2015   SIADH  (syndrome of inappropriate ADH production) (HCC) 04/13/2013   Memory problem 04/13/2013   Annual physical exam 03/06/2012   THORACOLUMBAR SCOLIOSIS, MILD 08/18/2010   BACK PAIN, LUMBAR 07/21/2010   Anxiety state 04/01/2009   Essential hypertension, benign 01/04/2009   INSOMNIA-SLEEP DISORDER-UNSPEC 09/08/2008    PCP: Wanda Plump, MD   REFERRING PROVIDER: Estill Bamberg, MD   REFERRING DIAG: M54.50 (ICD-10-CM) - Low back pain, unspecified   THERAPY DIAG:  Other low back pain  Other abnormalities of gait and mobility  RATIONALE FOR EVALUATION AND TREATMENT: Rehabilitation  ONSET DATE: x 3 years - 2021  NEXT MD VISIT: ~6 weeks   SUBJECTIVE:  SUBJECTIVE STATEMENT: Pt denies pain today. Notes he is tired due to getting to bed late last night. Pt reports he has a back surgery coming up next month. Only completing HEP once a day 2x/wk but denies any concerns.  PAIN: Are you having pain? No  PERTINENT HISTORY:  H/o of 3 yrs of lower back pain which affects his gait. Had a shot the other day - 1 week ago, which helped for 2 to 3 days. PMH: Chronic LBP, thoracolumbar scoliosis, hypertension, CLL (chronic lymphocytic leukemia), melanoma - back, anxiety, depression, insomnia  PRECAUTIONS: None  WEIGHT BEARING RESTRICTIONS: No  FALLS:  Has patient fallen in last 6 months? No  LIVING ENVIRONMENT: Lives with: lives with their spouse Lives in: House/apartment Stairs: Yes: Internal: 11 steps; can reach both and External: 5 steps; on right going up Has following equipment at home: None  OCCUPATION: works at desk, on computer most of the day   PLOF: Independent  PATIENT GOALS: "get my back strong while I am not hurting from the shot "   OBJECTIVE: (objective measures completed at  initial evaluation unless otherwise dated)  DIAGNOSTIC FINDINGS:  02/07/23 - Lumbar spine MRI: IMPRESSION: At L4-5 there is a broad-based disc bulge. Severe bilateral facet arthropathy and ligamentum flavum infolding. Severe spinal stenosis. No left foraminal stenosis. Severe right foraminal stenosis. At L5-S1 there is a broad-based disc osteophyte complex. Moderate bilateral facet arthropathy with a small right facet effusion. Severe left foraminal stenosis. Mild right foraminal stenosis. At L3-4 there is a broad-based disc bulge. Moderate bilateral facet arthropathy. Mild spinal stenosis. Moderate right and mild left foraminal stenosis. No acute osseous injury of the lumbar spine.  02/07/23 - Thoracic spine MRI: IMPRESSION: At T1-2 there is a minimal broad-based disc osteophyte complex.  Moderate-severe bilateral foraminal stenosis. No acute osseous injury of the thoracic spine. Partially visualized cervical spine spondylosis. If there is further clinical concern, recommend a dedicated MRI of the cervical spine.  PATIENT SURVEYS:  Modified Oswestry 20/50   SCREENING FOR RED FLAGS: Bowel or bladder incontinence: No Spinal tumors: No Cauda equina syndrome: No Compression fracture: No Abdominal aneurysm: No  COGNITION:  Overall cognitive status: Within functional limits for tasks assessed    SENSATION: WFL  MUSCLE LENGTH: Hamstrings: Right 35 deg; Left 35 deg Thomas test: Right WNL deg; Left WNL deg  POSTURE:  decreased lumbar lordosis and R lateral pelvic shift , very straight lumbar region L 1 to S1.  Minimal/can't muscle mass B gluteals  PALPATION: Non tender spinous processes lumbar region, paraspinals, quadratus   LUMBAR ROM:   Active  eval  Flexion 75  Extension 45  Right lateral flexion 35  Left lateral flexion 35  Right rotation   Left rotation   (Blank rows = not tested)  LOWER EXTREMITY ROM:    all hip, knee, ankle AROM and PROM wnl B   LOWER EXTREMITY  MMT:    MMT Right eval Left eval  Hip flexion 4 4  Hip extension 4- 4-  Hip abduction 3+ 3+  Hip adduction    Hip internal rotation    Hip external rotation    Knee flexion    Knee extension 4+ 4+  Ankle dorsiflexion 4 4  Ankle plantarflexion 4 4  Ankle inversion    Ankle eversion     (Blank rows = not tested)  LUMBAR SPECIAL TESTS:  Straight leg raise test: Negative  FUNCTIONAL TESTS:  30 seconds chair stand test 15 reps 6 min walk  test  GAIT: Distance walked: 1200' Assistive device utilized: None Level of assistance: SBA Comments: gait with pronounced B decreased step length, frequent ataxia L LE, tends to plant B forefeet, no heel strike B  FUNCTIONAL TESTS: (03/30/23) 5 times sit to stand: 10.31 sec Timed up and go (TUG): 9.66 sec 10 meter walk test: 8.75 sec Functional gait assessment: 20/30; 19-24 = medium risk fall    TODAY'S TREATMENT:   04/11/23 THERAPEUTIC EXERCISE: to improve flexibility, strength and mobility.  Verbal and tactile cues throughout for technique.  Rec Bike - L6 x 6 min B side-stepping with looped blue TB just below knees 4 x 10 ft along counter Fwd/back monster walk with looped blue TB just below knees 4 x 10 ft along counter Counter squat + blue TB hip ABD isometric 2 x 10 Standing TrA + blue TB row 10 x 3" Standing TrA + blue TB scap retraction + shoulder extension 10 x 3" Standing B blue TB pallof press 12 x 3" each R/L Fitter (1 black/1 blue) hip ABD x 15 each R/L Fitter (1 black/1 blue) hip extension x 15 each   04/05/23: Recumbent cycle 6 min level 6 Quadriped bird dog 15x 3 sec holds Prone ex for traps, scapular stabilizer engagement: rows 8# 15 reps Y's 2# 15 reps Shoulder extension, hand palm up 8# 15 reps Hamstring curls B 35#, 2 sets 15 reps cues to slow down for eccentric control Knee ext 45#, 3 sets 12 Lat pulls 45#, 2 sets 20 Standing hip abd alt with t band , blue 20x Standing alt hip ext with blue t band around  thighs 20x  Ladder climb against wall, ipsilateral arm and leg, 15 x, lost balance to R  with weight bearing on R LE   04/03/23: Therapeutic exercise: continued with therex progression to address LS spine stability and hip strength Walked in hall way, 3 laps, 900' for tissue perfusion, cardiovascular engagement Supine for pelvic tilts, 5 sec holds, 10 reps Bridging , with theraband around knees and sustained hip abd to engage proximal stabilizers Prone TKE's Prone alt hip and knee ext Quadriped bird dog Seated knee ext b  Seated hamstring curls B 35#, cues to reduce speed and control eccentrics Lat pulls 35#12 x 2 Seated rows 45# 12 x 2   PATIENT EDUCATION:  Education details: PT eval findings and initial HEP Person educated: Patient Education method: Explanation, Demonstration, Verbal cues, Handouts, and MedBridgeGO access code provided Education comprehension: verbalized understanding, returned demonstration, verbal cues required, and needs further education  HOME EXERCISE PROGRAM:\ Access Code: [email or web address] URL: [email or web address] Date: 04/03/2023 Prepared by: Linton Rump Speaks  Exercises - Hooklying Hamstring Stretch with Strap  - 1-2 x daily - 7 x weekly - 3 reps - 30 sec hold - Supine Posterior Pelvic Tilt  - 1-2 x daily - 7 x weekly - 2 sets - 10 reps - 5 sec hold - Hooklying Single Leg Bent Knee Fallouts with Resistance  - 1-2 x daily - 7 x weekly - 2 sets - 10 reps - 3 sec hold - Supine Bridge with Resistance Band  - 1-2 x daily - 7 x weekly - 2 sets - 10 reps - 5 sec hold - Hooklying Sequential Leg March and Lower  - 1-2 x daily - 7 x weekly - 2 sets - 10 reps - 3 sec hold - Bird Dog Progression  - 1 x daily - 7 x weekly - 3 sets - 10 reps Seated blue t  band hip abd and seated ankle pumps    ASSESSMENT:  CLINICAL IMPRESSION: Petra Kuba" admits to limited compliance with HEP but denies any concerns or need for review - STG met. Continued progression of hip  and core/lumbopelvic strengthening with more upright and dynamic activities today to incorporate balance and dynamic stability. Frequent cues for proper technique and pacing to best activate desire muscles but otherwise well tolerated. [name] will continue to benefit from skilled PT to address remaining strength and balance deficits to improve mobility and activity tolerance with decreased pain interference.   OBJECTIVE IMPAIRMENTS: Abnormal gait, decreased activity tolerance, decreased balance, decreased knowledge of condition, decreased mobility, decreased strength, impaired flexibility, improper body mechanics, postural dysfunction, and pain.   ACTIVITY LIMITATIONS: carrying, lifting, bending, squatting, stairs, and locomotion level  PARTICIPATION LIMITATIONS: cleaning, laundry, shopping, community activity, and yard work  PERSONAL FACTORS: Age, Fitness, Time since onset of injury/illness/exacerbation, and 1-2 comorbidities: chronic lymphocytic leukemia, anxiety  are also affecting patient's functional outcome.   REHAB POTENTIAL: Good  CLINICAL DECISION MAKING: Stable/uncomplicated  EVALUATION COMPLEXITY: Low   GOALS: Goals reviewed with patient? Yes  SHORT TERM GOALS: Target date: 2 weeks, 04/10/23   I HEP for  Baseline:  Goal status: MET  04/11/23 - Only completing HEP once a day 2x/wk but denies any concerns.  LONG TERM GOALS: Target date: 06/19/23   FGA 27/30   Baseline: 20/30 Goal status: IN PROGRESS    2.  30 sec sit to stand 17 reps .  Baseline: 15 reps Goal status: IN PROGRESS  3.  Increase strength B hips, quads, plantar flexors to 4+/5  Baseline: 3+ to 4+/5 Goal status: IN PROGRESS  4.  Modified oswestry increase to 30/50  Baseline: 20/50 Goal status: IN PROGRESS   PLAN:  PT FREQUENCY: 2x/week  PT DURATION: 8 weeks  PLANNED INTERVENTIONS: Therapeutic exercises, Therapeutic activity, Neuromuscular re-education, Balance training, Gait training, Patient/Family  education, Self Care, and Joint mobilization  PLAN FOR NEXT SESSION: strengthening on equipment and on mat for hips and core/lumbopelvic musculature   Marry Guan, PT 04/11/2023, 9:31 AM

## 2023-04-12 ENCOUNTER — Ambulatory Visit: Payer: Medicare HMO

## 2023-04-12 ENCOUNTER — Other Ambulatory Visit: Payer: Self-pay

## 2023-04-12 DIAGNOSIS — R2689 Other abnormalities of gait and mobility: Secondary | ICD-10-CM | POA: Diagnosis not present

## 2023-04-12 DIAGNOSIS — M48062 Spinal stenosis, lumbar region with neurogenic claudication: Secondary | ICD-10-CM | POA: Diagnosis not present

## 2023-04-12 DIAGNOSIS — M5459 Other low back pain: Secondary | ICD-10-CM | POA: Diagnosis not present

## 2023-04-12 NOTE — Therapy (Signed)
OUTPATIENT PHYSICAL THERAPY TREATMENT  Patient Name: Bobby [name] MRN: [id number] DOB:1951/01/04, 72 y.o., male Today's Date: 04/12/2023  END OF SESSION:  PT End of Session - 04/12/23 1022     Visit Number 6    Date for PT Re-Evaluation 06/19/23    Progress Note Due on Visit 10    PT Start Time 1017    PT Stop Time 1100    PT Time Calculation (min) 43 min    Activity Tolerance Patient tolerated treatment well    Behavior During Therapy [facility] for tasks assessed/performed                Past Medical History:  Diagnosis Date   Anxiety and depression    Hypertension    Hyponatremia 03/2013   ongoing, seeing Dr. Allena Katz   Insomnia    Melanoma in situ of back [facility])    biopsy on 03/05/2020   SIADH (syndrome of inappropriate ADH production) (HCC) 2017   Dr. Allena Katz   Squamous cell carcinoma in situ    Past Surgical History:  Procedure Laterality Date   BRONCHIAL BIOPSY  11/27/2022   Procedure: BRONCHIAL BIOPSIES;  Surgeon: Leslye Peer, MD;  Location: [facility] ENDOSCOPY;  Service: Cardiopulmonary;;   BRONCHIAL BRUSHINGS  11/27/2022   Procedure: BRONCHIAL BRUSHINGS;  Surgeon: Leslye Peer, MD;  Location: [place] ENDOSCOPY;  Service: Cardiopulmonary;;   BRONCHIAL WASHINGS  11/27/2022   Procedure: BRONCHIAL WASHINGS;  Surgeon: Leslye Peer, MD;  Location: [facility] ENDOSCOPY;  Service: Cardiopulmonary;;   MOHS SURGERY  ~ 04/2020   face, [facility]   SHOULDER SURGERY  1980s   right shoulder   VIDEO BRONCHOSCOPY N/A 11/27/2022   Procedure: VIDEO BRONCHOSCOPY WITH FLUORO;  Surgeon: Leslye Peer, MD;  Location: [facility] ENDOSCOPY;  Service: Cardiopulmonary;  Laterality: N/A;   Patient Active Problem List   Diagnosis Date Noted   CLL (chronic lymphocytic leukemia) (HCC) 02/21/2023   Abnormal CT of the chest 11/01/2022   Melanoma in situ of back (HCC) 03/16/2020   Squamous cell carcinoma in situ 08/21/2019   Vitamin D deficiency 05/29/2019   Cough 01/01/2016   PCP NOTES >>>> 09/28/2015    Hypogonadism in male 05/13/2015   SIADH (syndrome of inappropriate ADH production) (HCC) 04/13/2013   Memory problem 04/13/2013   Annual physical exam 03/06/2012   THORACOLUMBAR SCOLIOSIS, MILD 08/18/2010   BACK PAIN, LUMBAR 07/21/2010   Anxiety state 04/01/2009   Essential hypertension, benign 01/04/2009   INSOMNIA-SLEEP DISORDER-UNSPEC 09/08/2008    PCP: Wanda Plump, MD   REFERRING PROVIDER: Estill Bamberg, MD   REFERRING DIAG: M54.50 (ICD-10-CM) - Low back pain, unspecified   THERAPY DIAG:  Other low back pain  Other abnormalities of gait and mobility  Spinal stenosis of lumbar region with neurogenic claudication  RATIONALE FOR EVALUATION AND TREATMENT: Rehabilitation  ONSET DATE: x 3 years - 2021  NEXT MD VISIT: ~6 weeks   SUBJECTIVE:  Met with surgeon yesterday and plans to go ahead with the LS fusion surgery.  Would like to come in as much as possible between now and surgery date to maximize his strength and posture to enhance his recovery. SUBJECTIVE STATEMENT: Pt denies pain today. Notes he is tired due to getting to bed late last night. Pt reports he has a back surgery coming up next month. Only completing HEP once a day 2x/wk but denies any concerns.  PAIN: Are you having pain? No  PERTINENT HISTORY:  H/o of 3 yrs of lower back pain which affects his gait. Had a shot the other day - 1 week ago, which helped for 2 to 3 days. PMH: Chronic LBP, thoracolumbar scoliosis, hypertension, CLL (chronic lymphocytic leukemia), melanoma - back, anxiety, depression, insomnia  PRECAUTIONS: None  WEIGHT BEARING RESTRICTIONS: No  FALLS:  Has patient fallen in last 6 months? No  LIVING ENVIRONMENT: Lives with: lives with their spouse Lives in: House/apartment Stairs: Yes:  Internal: 11 steps; can reach both and External: 5 steps; on right going up Has following equipment at home: None  OCCUPATION: works at desk, on computer most of the day   PLOF: Independent  PATIENT GOALS: "get my back strong while I am not hurting from the shot "   OBJECTIVE: (objective measures completed at initial evaluation unless otherwise dated)  DIAGNOSTIC FINDINGS:  02/07/23 - Lumbar spine MRI: IMPRESSION: At L4-5 there is a broad-based disc bulge. Severe bilateral facet arthropathy and ligamentum flavum infolding. Severe spinal stenosis. No left foraminal stenosis. Severe right foraminal stenosis. At L5-S1 there is a broad-based disc osteophyte complex. Moderate bilateral facet arthropathy with a small right facet effusion. Severe left foraminal stenosis. Mild right foraminal stenosis. At L3-4 there is a broad-based disc bulge. Moderate bilateral facet arthropathy. Mild spinal stenosis. Moderate right and mild left foraminal stenosis. No acute osseous injury of the lumbar spine.  02/07/23 - Thoracic spine MRI: IMPRESSION: At T1-2 there is a minimal broad-based disc osteophyte complex.  Moderate-severe bilateral foraminal stenosis. No acute osseous injury of the thoracic spine. Partially visualized cervical spine spondylosis. If there is further clinical concern, recommend a dedicated MRI of the cervical spine.  PATIENT SURVEYS:  Modified Oswestry 20/50   SCREENING FOR RED FLAGS: Bowel or bladder incontinence: No Spinal tumors: No Cauda equina syndrome: No Compression fracture: No Abdominal aneurysm: No  COGNITION:  Overall cognitive status: Within functional limits for tasks assessed    SENSATION: WFL  MUSCLE LENGTH: Hamstrings: Right 35 deg; Left 35 deg Thomas test: Right WNL deg; Left WNL deg  POSTURE:  decreased lumbar lordosis and R lateral pelvic shift , very straight lumbar region L 1 to S1.  Minimal/can't muscle mass B gluteals  PALPATION: Non tender  spinous processes lumbar region, paraspinals, quadratus   LUMBAR ROM:   Active  eval  Flexion 75  Extension 45  Right lateral flexion 35  Left lateral flexion 35  Right rotation   Left rotation   (Blank rows = not tested)  LOWER EXTREMITY ROM:    all hip, knee, ankle AROM and PROM wnl B   LOWER EXTREMITY MMT:    MMT Right eval Left eval  Hip flexion 4 4  Hip extension 4- 4-  Hip abduction 3+ 3+  Hip adduction    Hip internal rotation    Hip external rotation    Knee flexion    Knee extension 4+ 4+  Ankle dorsiflexion 4 4  Ankle plantarflexion 4 4  Ankle  inversion    Ankle eversion     (Blank rows = not tested)  LUMBAR SPECIAL TESTS:  Straight leg raise test: Negative  FUNCTIONAL TESTS:  30 seconds chair stand test 15 reps 6 min walk test  GAIT: Distance walked: 1200' Assistive device utilized: None Level of assistance: SBA Comments: gait with pronounced B decreased step length, frequent ataxia L LE, tends to plant B forefeet, no heel strike B  FUNCTIONAL TESTS: (03/30/23) 5 times sit to stand: 10.31 sec Timed up and go (TUG): 9.66 sec 10 meter walk test: 8.75 sec Functional gait assessment: 20/30; 19-24 = medium risk fall    TODAY'S TREATMENT:  04/12/23: Rec bike level 3 6 min  Seated B knee flexion 45#, 3 sets 10  Seated B long arc quads 45# 3 sets 10 Seated rows 45# 3 sets 10 Lat rows 45#, 3 sets 10 Quadriped B hip ext  15x 3 sec holds , with 2 # cuff weights ankles B  Supine dead bugs alternating with isometric abd cxn  04/11/23 THERAPEUTIC EXERCISE: to improve flexibility, strength and mobility.  Verbal and tactile cues throughout for technique.  Rec Bike - L6 x 6 min B side-stepping with looped blue TB just below knees 4 x 10 ft along counter Fwd/back monster walk with looped blue TB just below knees 4 x 10 ft along counter Counter squat + blue TB hip ABD isometric 2 x 10 Standing TrA + blue TB row 10 x 3" Standing TrA + blue TB scap  retraction + shoulder extension 10 x 3" Standing B blue TB pallof press 12 x 3" each R/L Fitter (1 black/1 blue) hip ABD x 15 each R/L Fitter (1 black/1 blue) hip extension x 15 each   04/05/23: Recumbent cycle 6 min level 6 Quadriped bird dog 15x 3 sec holds Prone ex for traps, scapular stabilizer engagement: rows 8# 15 reps Y's 2# 15 reps Shoulder extension, hand palm up 8# 15 reps Hamstring curls B 35#, 2 sets 15 reps cues to slow down for eccentric control Knee ext 45#, 3 sets 12 Lat pulls 45#, 2 sets 20 Standing hip abd alt with t band , blue 20x Standing alt hip ext with blue t band around thighs 20x  Ladder climb against wall, ipsilateral arm and leg, 15 x, lost balance to R  with weight bearing on R LE   04/03/23: Therapeutic exercise: continued with therex progression to address LS spine stability and hip strength Walked in hall way, 3 laps, 900' for tissue perfusion, cardiovascular engagement Supine for pelvic tilts, 5 sec holds, 10 reps Bridging , with theraband around knees and sustained hip abd to engage proximal stabilizers Prone TKE's Prone alt hip and knee ext Quadriped bird dog Seated knee ext b  Seated hamstring curls B 35#, cues to reduce speed and control eccentrics Lat pulls 35#12 x 2 Seated rows 45# 12 x 2   PATIENT EDUCATION:  Education details: PT eval findings and initial HEP Person educated: Patient Education method: Explanation, Demonstration, Verbal cues, Handouts, and MedBridgeGO access code provided Education comprehension: verbalized understanding, returned demonstration, verbal cues required, and needs further education  HOME EXERCISE PROGRAM:\ Access Code: [email or web address] URL: [email or web address] Date: 04/03/2023 Prepared by: Linton Rump Rashaad Hallstrom  Exercises - Hooklying Hamstring Stretch with Strap  - 1-2 x daily - 7 x weekly - 3 reps - 30 sec hold - Supine Posterior Pelvic Tilt  - 1-2 x daily - 7 x weekly - 2 sets - 10 reps - 5  sec hold -  Hooklying Single Leg Bent Knee Fallouts with Resistance  - 1-2 x daily - 7 x weekly - 2 sets - 10 reps - 3 sec hold - Supine Bridge with Resistance Band  - 1-2 x daily - 7 x weekly - 2 sets - 10 reps - 5 sec hold - Hooklying Sequential Leg March and Lower  - 1-2 x daily - 7 x weekly - 2 sets - 10 reps - 3 sec hold - Bird Dog Progression  - 1 x daily - 7 x weekly - 3 sets - 10 reps Seated blue t band hip abd and seated ankle pumps    ASSESSMENT:  CLINICAL IMPRESSION: Juston "Bill" Scherzer returned today for ongoing skilled PT due to weakness B hips and trunk, and balance , mobility deficits.  He met with orthopedic surgeon this week and has plans to undergo LS fusion, thinks should be within the next month.  Still has stability, motor control deficits , tends to move quickly, choppy gait, difficulty controlling R hip extension when isolating this movement. He wishes to increase his frequency to 3 x week if possible to build his strength as much as possible to improve his surgical outcome.   [name] will continue to benefit from skilled PT to address remaining strength and balance deficits to improve mobility and activity tolerance with decreased pain interference.   OBJECTIVE IMPAIRMENTS: Abnormal gait, decreased activity tolerance, decreased balance, decreased knowledge of condition, decreased mobility, decreased strength, impaired flexibility, improper body mechanics, postural dysfunction, and pain.   ACTIVITY LIMITATIONS: carrying, lifting, bending, squatting, stairs, and locomotion level  PARTICIPATION LIMITATIONS: cleaning, laundry, shopping, community activity, and yard work  PERSONAL FACTORS: Age, Fitness, Time since onset of injury/illness/exacerbation, and 1-2 comorbidities: chronic lymphocytic leukemia, anxiety  are also affecting patient's functional outcome.   REHAB POTENTIAL: Good  CLINICAL DECISION MAKING: Stable/uncomplicated  EVALUATION COMPLEXITY: Low   GOALS: Goals  reviewed with patient? Yes  SHORT TERM GOALS: Target date: 2 weeks, 04/10/23   I HEP for  Baseline:  Goal status: MET  04/11/23 - Only completing HEP once a day 2x/wk but denies any concerns.  LONG TERM GOALS: Target date: 06/19/23   FGA 27/30   Baseline: 20/30 Goal status: IN PROGRESS    2.  30 sec sit to stand 17 reps .  Baseline: 15 reps Goal status: IN PROGRESS  3.  Increase strength B hips, quads, plantar flexors to 4+/5  Baseline: 3+ to 4+/5 Goal status: IN PROGRESS  4.  Modified oswestry increase to 30/50  Baseline: 20/50 Goal status: IN PROGRESS   PLAN:  PT FREQUENCY: 2x/week  PT DURATION: 8 weeks  PLANNED INTERVENTIONS: Therapeutic exercises, Therapeutic activity, Neuromuscular re-education, Balance training, Gait training, Patient/Family education, Self Care, and Joint mobilization  PLAN FOR NEXT SESSION: strengthening on equipment and on mat for hips and core/lumbopelvic musculature   Annette Liotta L Ellesse Antenucci, PT 04/12/2023, 11:28 AM

## 2023-04-13 ENCOUNTER — Encounter: Payer: Medicare HMO | Admitting: Physical Therapy

## 2023-04-17 ENCOUNTER — Ambulatory Visit: Payer: Medicare HMO | Admitting: Physical Therapy

## 2023-04-18 ENCOUNTER — Ambulatory Visit: Payer: Medicare HMO

## 2023-04-18 ENCOUNTER — Other Ambulatory Visit: Payer: Self-pay | Admitting: Orthopedic Surgery

## 2023-04-18 DIAGNOSIS — R2689 Other abnormalities of gait and mobility: Secondary | ICD-10-CM

## 2023-04-18 DIAGNOSIS — M48062 Spinal stenosis, lumbar region with neurogenic claudication: Secondary | ICD-10-CM

## 2023-04-18 DIAGNOSIS — M5459 Other low back pain: Secondary | ICD-10-CM | POA: Diagnosis not present

## 2023-04-18 NOTE — Therapy (Signed)
OUTPATIENT PHYSICAL THERAPY TREATMENT  Patient Name: Bobby [name] MRN: [id number] DOB:1950-11-27, 72 y.o., male Today's Date: 04/18/2023  END OF SESSION:  PT End of Session - 04/18/23 1133     Visit Number 7    Date for PT Re-Evaluation 06/19/23    Progress Note Due on Visit 10    PT Start Time 1104    PT Stop Time 1147    PT Time Calculation (min) 43 min    Activity Tolerance Patient tolerated treatment well    Behavior During Therapy [facility] for tasks assessed/performed                 Past Medical History:  Diagnosis Date   Anxiety and depression    Hypertension    Hyponatremia 03/2013   ongoing, seeing Dr. Allena Katz   Insomnia    Melanoma in situ of back [facility])    biopsy on 03/05/2020   SIADH (syndrome of inappropriate ADH production) (HCC) 2017   Dr. Allena Katz   Squamous cell carcinoma in situ    Past Surgical History:  Procedure Laterality Date   BRONCHIAL BIOPSY  11/27/2022   Procedure: BRONCHIAL BIOPSIES;  Surgeon: Leslye Peer, MD;  Location: [facility] ENDOSCOPY;  Service: Cardiopulmonary;;   BRONCHIAL BRUSHINGS  11/27/2022   Procedure: BRONCHIAL BRUSHINGS;  Surgeon: Leslye Peer, MD;  Location: [facility] ENDOSCOPY;  Service: Cardiopulmonary;;   BRONCHIAL WASHINGS  11/27/2022   Procedure: BRONCHIAL WASHINGS;  Surgeon: Leslye Peer, MD;  Location: [facility] ENDOSCOPY;  Service: Cardiopulmonary;;   MOHS SURGERY  ~ 04/2020   face, [facility]   SHOULDER SURGERY  1980s   right shoulder   VIDEO BRONCHOSCOPY N/A 11/27/2022   Procedure: VIDEO BRONCHOSCOPY WITH FLUORO;  Surgeon: Leslye Peer, MD;  Location: [facility] ENDOSCOPY;  Service: Cardiopulmonary;  Laterality: N/A;   Patient Active Problem List   Diagnosis Date Noted   CLL (chronic lymphocytic leukemia) (HCC) 02/21/2023   Abnormal CT of the chest 11/01/2022   Melanoma in situ of back (HCC) 03/16/2020   Squamous cell carcinoma in situ 08/21/2019   Vitamin D deficiency 05/29/2019   Cough 01/01/2016   PCP NOTES >>>> 09/28/2015    Hypogonadism in male 05/13/2015   SIADH (syndrome of inappropriate ADH production) (HCC) 04/13/2013   Memory problem 04/13/2013   Annual physical exam 03/06/2012   THORACOLUMBAR SCOLIOSIS, MILD 08/18/2010   BACK PAIN, LUMBAR 07/21/2010   Anxiety state 04/01/2009   Essential hypertension, benign 01/04/2009   INSOMNIA-SLEEP DISORDER-UNSPEC 09/08/2008    PCP: Wanda Plump, MD   REFERRING PROVIDER: Estill Bamberg, MD   REFERRING DIAG: M54.50 (ICD-10-CM) - Low back pain, unspecified   THERAPY DIAG:  Other low back pain  Other abnormalities of gait and mobility  Spinal stenosis of lumbar region with neurogenic claudication  RATIONALE FOR EVALUATION AND TREATMENT: Rehabilitation  ONSET DATE: x 3 years - 2021  NEXT MD VISIT: ~6 weeks   SUBJECTIVE:  SUBJECTIVE STATEMENT: Pt reports doing some exercises at home. Took a pain pill before he came.  PAIN: Are you having pain? No  PERTINENT HISTORY:  H/o of 3 yrs of lower back pain which affects his gait. Had a shot the other day - 1 week ago, which helped for 2 to 3 days. PMH: Chronic LBP, thoracolumbar scoliosis, hypertension, CLL (chronic lymphocytic leukemia), melanoma - back, anxiety, depression, insomnia  PRECAUTIONS: None  WEIGHT BEARING RESTRICTIONS: No  FALLS:  Has patient fallen in last 6 months? No  LIVING ENVIRONMENT: Lives with: lives with their spouse Lives in: House/apartment Stairs: Yes: Internal: 11 steps; can reach both and External: 5 steps; on right going up Has following equipment at home: None  OCCUPATION: works at desk, on computer most of the day   PLOF: Independent  PATIENT GOALS: "get my back strong while I am not hurting from the shot "   OBJECTIVE: (objective measures completed at initial  evaluation unless otherwise dated)  DIAGNOSTIC FINDINGS:  02/07/23 - Lumbar spine MRI: IMPRESSION: At L4-5 there is a broad-based disc bulge. Severe bilateral facet arthropathy and ligamentum flavum infolding. Severe spinal stenosis. No left foraminal stenosis. Severe right foraminal stenosis. At L5-S1 there is a broad-based disc osteophyte complex. Moderate bilateral facet arthropathy with a small right facet effusion. Severe left foraminal stenosis. Mild right foraminal stenosis. At L3-4 there is a broad-based disc bulge. Moderate bilateral facet arthropathy. Mild spinal stenosis. Moderate right and mild left foraminal stenosis. No acute osseous injury of the lumbar spine.  02/07/23 - Thoracic spine MRI: IMPRESSION: At T1-2 there is a minimal broad-based disc osteophyte complex.  Moderate-severe bilateral foraminal stenosis. No acute osseous injury of the thoracic spine. Partially visualized cervical spine spondylosis. If there is further clinical concern, recommend a dedicated MRI of the cervical spine.  PATIENT SURVEYS:  Modified Oswestry 20/50   SCREENING FOR RED FLAGS: Bowel or bladder incontinence: No Spinal tumors: No Cauda equina syndrome: No Compression fracture: No Abdominal aneurysm: No  COGNITION:  Overall cognitive status: Within functional limits for tasks assessed    SENSATION: WFL  MUSCLE LENGTH: Hamstrings: Right 35 deg; Left 35 deg Thomas test: Right WNL deg; Left WNL deg  POSTURE:  decreased lumbar lordosis and R lateral pelvic shift , very straight lumbar region L 1 to S1.  Minimal/can't muscle mass B gluteals  PALPATION: Non tender spinous processes lumbar region, paraspinals, quadratus   LUMBAR ROM:   Active  eval  Flexion 75  Extension 45  Right lateral flexion 35  Left lateral flexion 35  Right rotation   Left rotation   (Blank rows = not tested)  LOWER EXTREMITY ROM:    all hip, knee, ankle AROM and PROM wnl B   LOWER EXTREMITY MMT:     MMT Right eval Left eval  Hip flexion 4 4  Hip extension 4- 4-  Hip abduction 3+ 3+  Hip adduction    Hip internal rotation    Hip external rotation    Knee flexion    Knee extension 4+ 4+  Ankle dorsiflexion 4 4  Ankle plantarflexion 4 4  Ankle inversion    Ankle eversion     (Blank rows = not tested)  LUMBAR SPECIAL TESTS:  Straight leg raise test: Negative  FUNCTIONAL TESTS:  30 seconds chair stand test 15 reps 6 min walk test  GAIT: Distance walked: 1200' Assistive device utilized: None Level of assistance: SBA Comments: gait with pronounced B decreased step length, frequent ataxia  L LE, tends to plant B forefeet, no heel strike B  FUNCTIONAL TESTS: (03/30/23) 5 times sit to stand: 10.31 sec Timed up and go (TUG): 9.66 sec 10 meter walk test: 8.75 sec Functional gait assessment: 20/30; 19-24 = medium risk fall    TODAY'S TREATMENT:  04/18/23 Bike L4x47min Pallof press 5# 2x10 B Standing rows with cable 20# 2x10 Bridge with TrA 2x10 5 sec holds S/L clamshell blue TB 2x10 B Knee flexion 20# 2x10 R/L Knee extension 20# 2x10 R/L  04/12/23: Rec bike level 3 6 min  Seated B knee flexion 45#, 3 sets 10  Seated B long arc quads 45# 3 sets 10 Seated rows 45# 3 sets 10 Lat rows 45#, 3 sets 10 Quadriped B hip ext  15x 3 sec holds , with 2 # cuff weights ankles B  Supine dead bugs alternating with isometric abd cxn  04/11/23 THERAPEUTIC EXERCISE: to improve flexibility, strength and mobility.  Verbal and tactile cues throughout for technique.  Rec Bike - L6 x 6 min B side-stepping with looped blue TB just below knees 4 x 10 ft along counter Fwd/back monster walk with looped blue TB just below knees 4 x 10 ft along counter Counter squat + blue TB hip ABD isometric 2 x 10 Standing TrA + blue TB row 10 x 3" Standing TrA + blue TB scap retraction + shoulder extension 10 x 3" Standing B blue TB pallof press 12 x 3" each R/L Fitter (1 black/1 blue) hip ABD x 15  each R/L Fitter (1 black/1 blue) hip extension x 15 each   04/05/23: Recumbent cycle 6 min level 6 Quadriped bird dog 15x 3 sec holds Prone ex for traps, scapular stabilizer engagement: rows 8# 15 reps Y's 2# 15 reps Shoulder extension, hand palm up 8# 15 reps Hamstring curls B 35#, 2 sets 15 reps cues to slow down for eccentric control Knee ext 45#, 3 sets 12 Lat pulls 45#, 2 sets 20 Standing hip abd alt with t band , blue 20x Standing alt hip ext with blue t band around thighs 20x  Ladder climb against wall, ipsilateral arm and leg, 15 x, lost balance to R  with weight bearing on R LE   04/03/23: Therapeutic exercise: continued with therex progression to address LS spine stability and hip strength Walked in hall way, 3 laps, 900' for tissue perfusion, cardiovascular engagement Supine for pelvic tilts, 5 sec holds, 10 reps Bridging , with theraband around knees and sustained hip abd to engage proximal stabilizers Prone TKE's Prone alt hip and knee ext Quadriped bird dog Seated knee ext b  Seated hamstring curls B 35#, cues to reduce speed and control eccentrics Lat pulls 35#12 x 2 Seated rows 45# 12 x 2   PATIENT EDUCATION:  Education details: PT eval findings and initial HEP Person educated: Patient Education method: Explanation, Demonstration, Verbal cues, Handouts, and MedBridgeGO access code provided Education comprehension: verbalized understanding, returned demonstration, verbal cues required, and needs further education  HOME EXERCISE PROGRAM:\ Access Code: [email or web address] URL: [email or web address] Date: 04/03/2023 Prepared by: Linton Rump Speaks  Exercises - Hooklying Hamstring Stretch with Strap  - 1-2 x daily - 7 x weekly - 3 reps - 30 sec hold - Supine Posterior Pelvic Tilt  - 1-2 x daily - 7 x weekly - 2 sets - 10 reps - 5 sec hold - Hooklying Single Leg Bent Knee Fallouts with Resistance  - 1-2 x daily - 7 x weekly - 2 sets - 10  reps - 3 sec hold - Supine  Bridge with Resistance Band  - 1-2 x daily - 7 x weekly - 2 sets - 10 reps - 5 sec hold - Hooklying Sequential Leg March and Lower  - 1-2 x daily - 7 x weekly - 2 sets - 10 reps - 3 sec hold - Bird Dog Progression  - 1 x daily - 7 x weekly - 3 sets - 10 reps Seated blue t band hip abd and seated ankle pumps    ASSESSMENT:  CLINICAL IMPRESSION: Pt showed good tolerance for the progression of exercises. Progressed exercises while providing cues to correct form and technique. He had many questions and concerns about his lumbar fusion he is getting on 6/27 and I also answered his questions to the best of my ability.   OBJECTIVE IMPAIRMENTS: Abnormal gait, decreased activity tolerance, decreased balance, decreased knowledge of condition, decreased mobility, decreased strength, impaired flexibility, improper body mechanics, postural dysfunction, and pain.   ACTIVITY LIMITATIONS: carrying, lifting, bending, squatting, stairs, and locomotion level  PARTICIPATION LIMITATIONS: cleaning, laundry, shopping, community activity, and yard work  PERSONAL FACTORS: Age, Fitness, Time since onset of injury/illness/exacerbation, and 1-2 comorbidities: chronic lymphocytic leukemia, anxiety  are also affecting patient's functional outcome.   REHAB POTENTIAL: Good  CLINICAL DECISION MAKING: Stable/uncomplicated  EVALUATION COMPLEXITY: Low   GOALS: Goals reviewed with patient? Yes  SHORT TERM GOALS: Target date: 2 weeks, 04/10/23   I HEP for  Baseline:  Goal status: MET  04/11/23 - Only completing HEP once a day 2x/wk but denies any concerns.  LONG TERM GOALS: Target date: 06/19/23   FGA 27/30   Baseline: 20/30 Goal status: IN PROGRESS    2.  30 sec sit to stand 17 reps .  Baseline: 15 reps Goal status: IN PROGRESS  3.  Increase strength B hips, quads, plantar flexors to 4+/5  Baseline: 3+ to 4+/5 Goal status: IN PROGRESS  4.  Modified oswestry increase to 30/50  Baseline: 20/50 Goal status:  IN PROGRESS   PLAN:  PT FREQUENCY: 2x/week  PT DURATION: 8 weeks  PLANNED INTERVENTIONS: Therapeutic exercises, Therapeutic activity, Neuromuscular re-education, Balance training, Gait training, Patient/Family education, Self Care, and Joint mobilization  PLAN FOR NEXT SESSION: strengthening on equipment and on mat for hips and core/lumbopelvic musculature   Amyra Vantuyl L Rozena Fierro, PTA 04/18/2023, 11:56 AM

## 2023-04-23 ENCOUNTER — Ambulatory Visit: Payer: Medicare HMO | Attending: Orthopedic Surgery

## 2023-04-23 DIAGNOSIS — R2689 Other abnormalities of gait and mobility: Secondary | ICD-10-CM | POA: Insufficient documentation

## 2023-04-23 DIAGNOSIS — M48062 Spinal stenosis, lumbar region with neurogenic claudication: Secondary | ICD-10-CM | POA: Diagnosis not present

## 2023-04-23 DIAGNOSIS — M5459 Other low back pain: Secondary | ICD-10-CM | POA: Insufficient documentation

## 2023-04-23 NOTE — Therapy (Signed)
OUTPATIENT PHYSICAL THERAPY TREATMENT  Patient Name: Bobby [name] MRN: [id number] DOB:1951-10-21, 72 y.o., male Today's Date: 04/23/2023  END OF SESSION:  PT End of Session - 04/23/23 1540     Visit Number 8    Date for PT Re-Evaluation 06/19/23    Progress Note Due on Visit 10    PT Start Time 1534    PT Stop Time 1615    PT Time Calculation (min) 41 min    Activity Tolerance Patient tolerated treatment well    Behavior During Therapy [facility] for tasks assessed/performed                 Past Medical History:  Diagnosis Date   Anxiety and depression    Hypertension    Hyponatremia 03/2013   ongoing, seeing Dr. Allena Katz   Insomnia    Melanoma in situ of back [facility])    biopsy on 03/05/2020   SIADH (syndrome of inappropriate ADH production) (HCC) 2017   Dr. Allena Katz   Squamous cell carcinoma in situ    Past Surgical History:  Procedure Laterality Date   BRONCHIAL BIOPSY  11/27/2022   Procedure: BRONCHIAL BIOPSIES;  Surgeon: Leslye Peer, MD;  Location: [facility] ENDOSCOPY;  Service: Cardiopulmonary;;   BRONCHIAL BRUSHINGS  11/27/2022   Procedure: BRONCHIAL BRUSHINGS;  Surgeon: Leslye Peer, MD;  Location: [facility] ENDOSCOPY;  Service: Cardiopulmonary;;   BRONCHIAL WASHINGS  11/27/2022   Procedure: BRONCHIAL WASHINGS;  Surgeon: Leslye Peer, MD;  Location: [facility] ENDOSCOPY;  Service: Cardiopulmonary;;   MOHS SURGERY  ~ 04/2020   face, [facility]   SHOULDER SURGERY  1980s   right shoulder   VIDEO BRONCHOSCOPY N/A 11/27/2022   Procedure: VIDEO BRONCHOSCOPY WITH FLUORO;  Surgeon: Leslye Peer, MD;  Location: [facility] ENDOSCOPY;  Service: Cardiopulmonary;  Laterality: N/A;   Patient Active Problem List   Diagnosis Date Noted   CLL (chronic lymphocytic leukemia) (HCC) 02/21/2023   Abnormal CT of the chest 11/01/2022   Melanoma in situ of back (HCC) 03/16/2020   Squamous cell carcinoma in situ 08/21/2019   Vitamin D deficiency 05/29/2019   Cough 01/01/2016   PCP NOTES >>>> 09/28/2015    Hypogonadism in male 05/13/2015   SIADH (syndrome of inappropriate ADH production) (HCC) 04/13/2013   Memory problem 04/13/2013   Annual physical exam 03/06/2012   THORACOLUMBAR SCOLIOSIS, MILD 08/18/2010   BACK PAIN, LUMBAR 07/21/2010   Anxiety state 04/01/2009   Essential hypertension, benign 01/04/2009   INSOMNIA-SLEEP DISORDER-UNSPEC 09/08/2008    PCP: Wanda Plump, MD   REFERRING PROVIDER: Estill Bamberg, MD   REFERRING DIAG: M54.50 (ICD-10-CM) - Low back pain, unspecified   THERAPY DIAG:  Other low back pain  Other abnormalities of gait and mobility  Spinal stenosis of lumbar region with neurogenic claudication  RATIONALE FOR EVALUATION AND TREATMENT: Rehabilitation  ONSET DATE: x 3 years - 2021  NEXT MD VISIT: ~6 weeks   SUBJECTIVE:  SUBJECTIVE STATEMENT: Pt reports doing some exercises at home. Took a pain pill before he came.  PAIN: Are you having pain? No  PERTINENT HISTORY:  H/o of 3 yrs of lower back pain which affects his gait. Had a shot the other day - 1 week ago, which helped for 2 to 3 days. PMH: Chronic LBP, thoracolumbar scoliosis, hypertension, CLL (chronic lymphocytic leukemia), melanoma - back, anxiety, depression, insomnia  PRECAUTIONS: None  WEIGHT BEARING RESTRICTIONS: No  FALLS:  Has patient fallen in last 6 months? No  LIVING ENVIRONMENT: Lives with: lives with their spouse Lives in: House/apartment Stairs: Yes: Internal: 11 steps; can reach both and External: 5 steps; on right going up Has following equipment at home: None  OCCUPATION: works at desk, on computer most of the day   PLOF: Independent  PATIENT GOALS: "get my back strong while I am not hurting from the shot "   OBJECTIVE: (objective measures completed at initial  evaluation unless otherwise dated)  DIAGNOSTIC FINDINGS:  02/07/23 - Lumbar spine MRI: IMPRESSION: At L4-5 there is a broad-based disc bulge. Severe bilateral facet arthropathy and ligamentum flavum infolding. Severe spinal stenosis. No left foraminal stenosis. Severe right foraminal stenosis. At L5-S1 there is a broad-based disc osteophyte complex. Moderate bilateral facet arthropathy with a small right facet effusion. Severe left foraminal stenosis. Mild right foraminal stenosis. At L3-4 there is a broad-based disc bulge. Moderate bilateral facet arthropathy. Mild spinal stenosis. Moderate right and mild left foraminal stenosis. No acute osseous injury of the lumbar spine.  02/07/23 - Thoracic spine MRI: IMPRESSION: At T1-2 there is a minimal broad-based disc osteophyte complex.  Moderate-severe bilateral foraminal stenosis. No acute osseous injury of the thoracic spine. Partially visualized cervical spine spondylosis. If there is further clinical concern, recommend a dedicated MRI of the cervical spine.  PATIENT SURVEYS:  Modified Oswestry 20/50   SCREENING FOR RED FLAGS: Bowel or bladder incontinence: No Spinal tumors: No Cauda equina syndrome: No Compression fracture: No Abdominal aneurysm: No  COGNITION:  Overall cognitive status: Within functional limits for tasks assessed    SENSATION: WFL  MUSCLE LENGTH: Hamstrings: Right 35 deg; Left 35 deg Thomas test: Right WNL deg; Left WNL deg  POSTURE:  decreased lumbar lordosis and R lateral pelvic shift , very straight lumbar region L 1 to S1.  Minimal/can't muscle mass B gluteals  PALPATION: Non tender spinous processes lumbar region, paraspinals, quadratus   LUMBAR ROM:   Active  eval  Flexion 75  Extension 45  Right lateral flexion 35  Left lateral flexion 35  Right rotation   Left rotation   (Blank rows = not tested)  LOWER EXTREMITY ROM:    all hip, knee, ankle AROM and PROM wnl B   LOWER EXTREMITY MMT:     MMT Right eval Left eval  Hip flexion 4 4  Hip extension 4- 4-  Hip abduction 3+ 3+  Hip adduction    Hip internal rotation    Hip external rotation    Knee flexion    Knee extension 4+ 4+  Ankle dorsiflexion 4 4  Ankle plantarflexion 4 4  Ankle inversion    Ankle eversion     (Blank rows = not tested)  LUMBAR SPECIAL TESTS:  Straight leg raise test: Negative  FUNCTIONAL TESTS:  30 seconds chair stand test 15 reps 6 min walk test  GAIT: Distance walked: 1200' Assistive device utilized: None Level of assistance: SBA Comments: gait with pronounced B decreased step length, frequent ataxia  L LE, tends to plant B forefeet, no heel strike B  FUNCTIONAL TESTS: (03/30/23) 5 times sit to stand: 10.31 sec Timed up and go (TUG): 9.66 sec 10 meter walk test: 8.75 sec Functional gait assessment: 20/30; 19-24 = medium risk fall    TODAY'S TREATMENT:  04/23/23 Therapeutic Exercise: to improve strength and mobility.  Demo, verbal and tactile cues throughout for technique.  Bike L4x20min Knee flexion 35# 2x10  Knee extension 30# 2x10  Supine bridge 2x10 with ball squeeze Alt knee lift and lower x 10 with TrA brace  TrA with pedals x 5  Standing shoulder extension green TB 2x10  04/18/23 Bike L4x58min Pallof press 5# 2x10 B Standing rows with cable 20# 2x10 Bridge with TrA 2x10 5 sec holds S/L clamshell blue TB 2x10 B Knee flexion 20# 2x10 R/L Knee extension 20# 2x10 R/L  04/12/23: Rec bike level 3 6 min  Seated B knee flexion 45#, 3 sets 10  Seated B long arc quads 45# 3 sets 10 Seated rows 45# 3 sets 10 Lat rows 45#, 3 sets 10 Quadriped B hip ext  15x 3 sec holds , with 2 # cuff weights ankles B  Supine dead bugs alternating with isometric abd cxn  04/11/23 THERAPEUTIC EXERCISE: to improve flexibility, strength and mobility.  Verbal and tactile cues throughout for technique.  Rec Bike - L6 x 6 min B side-stepping with looped blue TB just below knees 4 x 10 ft along  counter Fwd/back monster walk with looped blue TB just below knees 4 x 10 ft along counter Counter squat + blue TB hip ABD isometric 2 x 10 Standing TrA + blue TB row 10 x 3" Standing TrA + blue TB scap retraction + shoulder extension 10 x 3" Standing B blue TB pallof press 12 x 3" each R/L Fitter (1 black/1 blue) hip ABD x 15 each R/L Fitter (1 black/1 blue) hip extension x 15 each   04/05/23: Recumbent cycle 6 min level 6 Quadriped bird dog 15x 3 sec holds Prone ex for traps, scapular stabilizer engagement: rows 8# 15 reps Y's 2# 15 reps Shoulder extension, hand palm up 8# 15 reps Hamstring curls B 35#, 2 sets 15 reps cues to slow down for eccentric control Knee ext 45#, 3 sets 12 Lat pulls 45#, 2 sets 20 Standing hip abd alt with t band , blue 20x Standing alt hip ext with blue t band around thighs 20x  Ladder climb against wall, ipsilateral arm and leg, 15 x, lost balance to R  with weight bearing on R LE   04/03/23: Therapeutic exercise: continued with therex progression to address LS spine stability and hip strength Walked in hall way, 3 laps, 900' for tissue perfusion, cardiovascular engagement Supine for pelvic tilts, 5 sec holds, 10 reps Bridging , with theraband around knees and sustained hip abd to engage proximal stabilizers Prone TKE's Prone alt hip and knee ext Quadriped bird dog Seated knee ext b  Seated hamstring curls B 35#, cues to reduce speed and control eccentrics Lat pulls 35#12 x 2 Seated rows 45# 12 x 2   PATIENT EDUCATION:  Education details: PT eval findings and initial HEP Person educated: Patient Education method: Explanation, Demonstration, Verbal cues, Handouts, and MedBridgeGO access code provided Education comprehension: verbalized understanding, returned demonstration, verbal cues required, and needs further education  HOME EXERCISE PROGRAM:\ Access Code: [email or web address] URL: [email or web address] Date: 04/03/2023 Prepared by: Bobby [name]  Exercises - Hooklying Hamstring Stretch with Strap  -  1-2 x daily - 7 x weekly - 3 reps - 30 sec hold - Supine Posterior Pelvic Tilt  - 1-2 x daily - 7 x weekly - 2 sets - 10 reps - 5 sec hold - Hooklying Single Leg Bent Knee Fallouts with Resistance  - 1-2 x daily - 7 x weekly - 2 sets - 10 reps - 3 sec hold - Supine Bridge with Resistance Band  - 1-2 x daily - 7 x weekly - 2 sets - 10 reps - 5 sec hold - Hooklying Sequential Leg March and Lower  - 1-2 x daily - 7 x weekly - 2 sets - 10 reps - 3 sec hold - Bird Dog Progression  - 1 x daily - 7 x weekly - 3 sets - 10 reps Seated blue t band hip abd and seated ankle pumps    ASSESSMENT:  CLINICAL IMPRESSION: Pt completed all interventions. Progressed with lumbar stabilization and LE strength. He needed lots of cues with the TrA w/ pedals and alt knee lifts in supine. Cues for slow contolled movement with the leg machine. Overall showed good tolerance for ex and would continue to benefit from skilled therapy.   OBJECTIVE IMPAIRMENTS: Abnormal gait, decreased activity tolerance, decreased balance, decreased knowledge of condition, decreased mobility, decreased strength, impaired flexibility, improper body mechanics, postural dysfunction, and pain.   ACTIVITY LIMITATIONS: carrying, lifting, bending, squatting, stairs, and locomotion level  PARTICIPATION LIMITATIONS: cleaning, laundry, shopping, community activity, and yard work  PERSONAL FACTORS: Age, Fitness, Time since onset of injury/illness/exacerbation, and 1-2 comorbidities: chronic lymphocytic leukemia, anxiety  are also affecting patient's functional outcome.   REHAB POTENTIAL: Good  CLINICAL DECISION MAKING: Stable/uncomplicated  EVALUATION COMPLEXITY: Low   GOALS: Goals reviewed with patient? Yes  SHORT TERM GOALS: Target date: 2 weeks, 04/10/23   I HEP for  Baseline:  Goal status: MET  04/11/23 - Only completing HEP once a day 2x/wk but denies any concerns.  LONG  TERM GOALS: Target date: 06/19/23   FGA 27/30   Baseline: 20/30 Goal status: IN PROGRESS    2.  30 sec sit to stand 17 reps .  Baseline: 15 reps Goal status: IN PROGRESS  3.  Increase strength B hips, quads, plantar flexors to 4+/5  Baseline: 3+ to 4+/5 Goal status: IN PROGRESS  4.  Modified oswestry increase to 30/50  Baseline: 20/50 Goal status: IN PROGRESS   PLAN:  PT FREQUENCY: 2x/week  PT DURATION: 8 weeks  PLANNED INTERVENTIONS: Therapeutic exercises, Therapeutic activity, Neuromuscular re-education, Balance training, Gait training, Patient/Family education, Self Care, and Joint mobilization  PLAN FOR NEXT SESSION: strengthening on equipment and on mat for hips and core/lumbopelvic musculature   Dayle Mcnerney L Amarius Toto, PTA 04/23/2023, 4:19 PM

## 2023-04-25 ENCOUNTER — Encounter: Payer: Self-pay | Admitting: Hematology & Oncology

## 2023-04-25 ENCOUNTER — Inpatient Hospital Stay: Payer: Medicare HMO | Attending: Hematology & Oncology

## 2023-04-25 ENCOUNTER — Other Ambulatory Visit: Payer: Self-pay

## 2023-04-25 ENCOUNTER — Inpatient Hospital Stay: Payer: Medicare HMO | Admitting: Hematology & Oncology

## 2023-04-25 VITALS — BP 133/72 | HR 61 | Temp 97.8°F | Resp 18 | Ht 72.0 in | Wt 181.0 lb

## 2023-04-25 DIAGNOSIS — C9110 Chronic lymphocytic leukemia of B-cell type not having achieved remission: Secondary | ICD-10-CM | POA: Diagnosis not present

## 2023-04-25 DIAGNOSIS — Z79899 Other long term (current) drug therapy: Secondary | ICD-10-CM | POA: Diagnosis not present

## 2023-04-25 DIAGNOSIS — D72820 Lymphocytosis (symptomatic): Secondary | ICD-10-CM | POA: Diagnosis not present

## 2023-04-25 DIAGNOSIS — I2510 Atherosclerotic heart disease of native coronary artery without angina pectoris: Secondary | ICD-10-CM | POA: Insufficient documentation

## 2023-04-25 DIAGNOSIS — E871 Hypo-osmolality and hyponatremia: Secondary | ICD-10-CM | POA: Insufficient documentation

## 2023-04-25 LAB — CMP (CANCER CENTER ONLY)
ALT: 21 U/L (ref 0–44)
AST: 24 U/L (ref 15–41)
Albumin: 4.5 g/dL (ref 3.5–5.0)
Alkaline Phosphatase: 63 U/L (ref 38–126)
Anion gap: 4 — ABNORMAL LOW (ref 5–15)
BUN: 19 mg/dL (ref 8–23)
CO2: 29 mmol/L (ref 22–32)
Calcium: 9.2 mg/dL (ref 8.9–10.3)
Chloride: 96 mmol/L — ABNORMAL LOW (ref 98–111)
Creatinine: 0.96 mg/dL (ref 0.61–1.24)
GFR, Estimated: >60 mL/min (ref >60)
Glucose, Bld: 102 mg/dL — ABNORMAL HIGH (ref 70–99)
Potassium: 4.8 mmol/L (ref 3.5–5.1)
Sodium: 129 mmol/L — ABNORMAL LOW (ref 135–145)
Total Bilirubin: 0.6 mg/dL (ref 0.3–1.2)
Total Protein: 6.7 g/dL (ref 6.5–8.1)

## 2023-04-25 LAB — CBC WITH DIFFERENTIAL (CANCER CENTER ONLY)
Abs Immature Granulocytes: 0.03 10*3/uL (ref 0.00–0.07)
Basophils Absolute: 0.1 10*3/uL (ref 0.0–0.1)
Basophils Relative: 0 %
Eosinophils Absolute: 0.3 10*3/uL (ref 0.0–0.5)
Eosinophils Relative: 2 %
HCT: 33.8 % — ABNORMAL LOW (ref 39.0–52.0)
Hemoglobin: 11.8 g/dL — ABNORMAL LOW (ref 13.0–17.0)
Immature Granulocytes: 0 %
Lymphocytes Relative: 69 %
Lymphs Abs: 11.1 10*3/uL — ABNORMAL HIGH (ref 0.7–4.0)
MCH: 32 pg (ref 26.0–34.0)
MCHC: 34.9 g/dL (ref 30.0–36.0)
MCV: 91.6 fL (ref 80.0–100.0)
Monocytes Absolute: 0.7 10*3/uL (ref 0.1–1.0)
Monocytes Relative: 4 %
Neutro Abs: 4 10*3/uL (ref 1.7–7.7)
Neutrophils Relative %: 25 %
Platelet Count: 222 10*3/uL (ref 150–400)
RBC: 3.69 MIL/uL — ABNORMAL LOW (ref 4.22–5.81)
RDW: 12.2 % (ref 11.5–15.5)
Smear Review: NORMAL
WBC Count: 16.3 10*3/uL — ABNORMAL HIGH (ref 4.0–10.5)
nRBC: 0 % (ref 0.0–0.2)

## 2023-04-25 LAB — SAVE SMEAR(SSMR), FOR PROVIDER SLIDE REVIEW

## 2023-04-25 LAB — LACTATE DEHYDROGENASE: LDH: 145 U/L (ref 98–192)

## 2023-04-25 NOTE — Progress Notes (Signed)
Hematology and Oncology Follow Up Visit  Bobby [name] [id number] [birth year] 72 y.o. 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  The big news is that he is going to have back surgery.  He will have back surgery on June 27.  He has significant back disease.  He has disc disease at L4-L5.  He will be operated on by Dr. Yevette Edwards at [facility].  He has been seeing Dr. Delton Coombes of Pulmonary medicine.  Dr. Delton Coombes has been evaluating his pulmonary issues.  He does have changes on his CT scan.  Thankfully, there is no evidence of malignancy.  He recently had a CT scan that was done.  This showed that there was some evidence of bronchiectasis.  Everything looked relatively stable.  He does have three-vessel coronary atherosclerosis.  I told he and his wife that this is some that his family doctor would have to consider evaluating.  I know his family doctor wants to put him on a statin drug.  I suppose this could be done.  His cholesterol levels look quite good.  His cholesterol/HDL ratio is only 3.  He has a very low LDL already.  Otherwise, he is having a tough time exercise because of his back.  He has had no swollen lymph nodes.  He is had no fever.  He has had no rashes.  There is been no bleeding.  He has had no change in bowel or bladder habits.  Overall, I would say that his performance status is probably ECOG 1.    Wt Readings from Last 3 Encounters:  04/25/23 181 lb (82.1 kg)  04/10/23 181 lb 9.6 oz (82.4 kg)  02/21/23 180 lb 2 oz (81.7 kg)     Medications:  Current Outpatient Medications:    atorvastatin (LIPITOR) 20 MG tablet, Take 1 tablet (20 mg total) by mouth at bedtime., Disp: 90 tablet, Rfl: 0   b complex vitamins capsule, Take 1 capsule by mouth daily., Disp: , Rfl:    carvedilol (COREG) 6.25 MG tablet, Take 1 tablet (6.25 mg total) by  mouth 2 (two) times daily with a meal., Disp: 180 tablet, Rfl: 3   cholecalciferol (VITAMIN D3) 25 MCG (1000 UNIT) tablet, Take 1,000 Units by mouth daily., Disp: , Rfl:    clonazePAM (KLONOPIN) 0.5 MG tablet, TAKE ONE TABLET BY MOUTH TWICE A DAY AS NEEDED FOR ANXIETY, Disp: 60 tablet, Rfl: 3   DUPIXENT 300 MG/2ML SOPN, Inject into the skin every 14 (fourteen) days., Disp: , Rfl:    folic acid (FOLVITE) 1 MG tablet, Take 1 tablet (1 mg total) by mouth daily., Disp: 90 tablet, Rfl: 2   Multiple Vitamin (MULTIVITAMIN WITH MINERALS) TABS, Take 1 tablet by mouth daily., Disp: , Rfl:    niacinamide 500 MG tablet, Take 500 mg by mouth 2 (two) times daily with a meal., Disp: , Rfl:    tadalafil (CIALIS) 10 MG tablet, TAKE ONE TO TWO TABLETS BY MOUTH EVERY OTHER DAY AS NEEDED, Disp: 20 tablet, Rfl: 3   thiamine 100 MG tablet, Take 1 tablet (100 mg total) by mouth daily., Disp: 90 tablet, Rfl: 2   triamcinolone cream (KENALOG) 0.1 %, SMARTSIG:1 Application Topical 2-3 Times Daily, Disp: , Rfl:    zolpidem (AMBIEN) 10 MG tablet, TAKE ONE TABLET BY MOUTH EVERY NIGHT AT BEDTIME AS NEEDED FOR SLEEP, Disp: 90 tablet,  Rfl: 1  Allergies:  Allergies  Allergen Reactions   Tramadol Hives    Past Medical History, Surgical history, Social history, and Family History were reviewed and updated.  Review of Systems: Review of Systems  Constitutional: Negative.   HENT:  Negative.    Eyes: Negative.   Respiratory: Negative.    Cardiovascular: Negative.   Gastrointestinal: Negative.   Endocrine: Negative.   Genitourinary: Negative.    Musculoskeletal:  Positive for back pain.  Skin: Negative.   Neurological: Negative.   Hematological: Negative.   Psychiatric/Behavioral: Negative.      Physical Exam:  height is 6' (1.829 m) and weight is 181 lb (82.1 kg). His oral temperature is 97.8 F (36.6 C). His blood pressure is 133/72 and his pulse is 61. His respiration is 18 and oxygen saturation is 100%.   Wt  Readings from Last 3 Encounters:  04/25/23 181 lb (82.1 kg)  04/10/23 181 lb 9.6 oz (82.4 kg)  02/21/23 180 lb 2 oz (81.7 kg)    Physical Exam Vitals reviewed.  HENT:     Head: Normocephalic and atraumatic.  Eyes:     Pupils: Pupils are equal, round, and reactive to light.  Cardiovascular:     Rate and Rhythm: Normal rate and regular rhythm.     Heart sounds: Normal heart sounds.  Pulmonary:     Effort: Pulmonary effort is normal.     Breath sounds: Normal breath sounds.  Abdominal:     General: Bowel sounds are normal.     Palpations: Abdomen is soft.  Musculoskeletal:        General: No tenderness or deformity. Normal range of motion.     Cervical back: Normal range of motion.  Lymphadenopathy:     Cervical: No cervical adenopathy.  Skin:    General: Skin is warm and dry.     Findings: No erythema or rash.  Neurological:     Mental Status: He is alert and oriented to person, place, and time.     Comments: Mild foot drop of the right leg   Psychiatric:        Behavior: Behavior normal.        Thought Content: Thought content normal.        Judgment: Judgment normal.     Lab Results  Component Value Date   WBC 16.3 (H) 04/25/2023   HGB 11.8 (L) 04/25/2023   HCT 33.8 (L) 04/25/2023   MCV 91.6 04/25/2023   PLT 222 04/25/2023     Chemistry      Component Value Date/Time   NA 129 (L) 04/25/2023 1020   NA 138 08/21/2022 0000   K 4.8 04/25/2023 1020   CL 96 (L) 04/25/2023 1020   CO2 29 04/25/2023 1020   BUN 19 04/25/2023 1020   BUN 15 08/21/2022 0000   CREATININE 0.96 04/25/2023 1020   GLU 89 08/21/2022 0000      Component Value Date/Time   CALCIUM 9.2 04/25/2023 1020   ALKPHOS 63 04/25/2023 1020   AST 24 04/25/2023 1020   ALT 21 04/25/2023 1020   BILITOT 0.6 04/25/2023 1020       Impression and Plan: Bobby [name] is a very nice 72 year old white male.  He has a monoclonal B-cell population of lymphocytes.  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  I really do not see a problem with him having surgery.  I do not think that he should have any problems healing from the  surgery.  His immune system should be okay.  I will go ahead and plan to get him back myself in the Fall.  I think this would be reasonable.  Hopefully, he would have healed up from his back surgery that he would not have any problems.  I think he does see a Nephrologist because of the hyponatremia.  Hopefully, his lung function will stabilize or maybe even get better.   Josph Macho, MD 6/5/202411:49 AM

## 2023-04-26 ENCOUNTER — Ambulatory Visit: Payer: Medicare HMO

## 2023-04-26 DIAGNOSIS — R2689 Other abnormalities of gait and mobility: Secondary | ICD-10-CM

## 2023-04-26 DIAGNOSIS — M5459 Other low back pain: Secondary | ICD-10-CM | POA: Diagnosis not present

## 2023-04-26 DIAGNOSIS — M48062 Spinal stenosis, lumbar region with neurogenic claudication: Secondary | ICD-10-CM

## 2023-04-26 NOTE — Therapy (Signed)
OUTPATIENT PHYSICAL THERAPY TREATMENT  Patient Name: Bobby [name] MRN: [id number] DOB:July 23, 1951, 72 y.o., male Today's Date: 04/26/2023  END OF SESSION:  PT End of Session - 04/26/23 1447     Visit Number 9    Date for PT Re-Evaluation 06/19/23    Progress Note Due on Visit 10    PT Start Time 1445    PT Stop Time 1530    PT Time Calculation (min) 45 min    Activity Tolerance Patient tolerated treatment well    Behavior During Therapy [facility] for tasks assessed/performed                  Past Medical History:  Diagnosis Date   Anxiety and depression    Hypertension    Hyponatremia 03/2013   ongoing, seeing Dr. Allena Katz   Insomnia    Melanoma in situ of back [facility])    biopsy on 03/05/2020   SIADH (syndrome of inappropriate ADH production) (HCC) 2017   Dr. Allena Katz   Squamous cell carcinoma in situ    Past Surgical History:  Procedure Laterality Date   BRONCHIAL BIOPSY  11/27/2022   Procedure: BRONCHIAL BIOPSIES;  Surgeon: Leslye Peer, MD;  Location: [facility] ENDOSCOPY;  Service: Cardiopulmonary;;   BRONCHIAL BRUSHINGS  11/27/2022   Procedure: BRONCHIAL BRUSHINGS;  Surgeon: Leslye Peer, MD;  Location: [facility] ENDOSCOPY;  Service: Cardiopulmonary;;   BRONCHIAL WASHINGS  11/27/2022   Procedure: BRONCHIAL WASHINGS;  Surgeon: Leslye Peer, MD;  Location: [facility] ENDOSCOPY;  Service: Cardiopulmonary;;   MOHS SURGERY  ~ 04/2020   face, [facility]   SHOULDER SURGERY  1980s   right shoulder   VIDEO BRONCHOSCOPY N/A 11/27/2022   Procedure: VIDEO BRONCHOSCOPY WITH FLUORO;  Surgeon: Leslye Peer, MD;  Location: [facility] ENDOSCOPY;  Service: Cardiopulmonary;  Laterality: N/A;   Patient Active Problem List   Diagnosis Date Noted   CLL (chronic lymphocytic leukemia) (HCC) 02/21/2023   Abnormal CT of the chest 11/01/2022   Melanoma in situ of back (HCC) 03/16/2020   Squamous cell carcinoma in situ 08/21/2019   Vitamin D deficiency 05/29/2019   Cough 01/01/2016   PCP NOTES >>>> 09/28/2015    Hypogonadism in male 05/13/2015   SIADH (syndrome of inappropriate ADH production) (HCC) 04/13/2013   Memory problem 04/13/2013   Annual physical exam 03/06/2012   THORACOLUMBAR SCOLIOSIS, MILD 08/18/2010   BACK PAIN, LUMBAR 07/21/2010   Anxiety state 04/01/2009   Essential hypertension, benign 01/04/2009   INSOMNIA-SLEEP DISORDER-UNSPEC 09/08/2008    PCP: Wanda Plump, MD   REFERRING PROVIDER: Estill Bamberg, MD   REFERRING DIAG: M54.50 (ICD-10-CM) - Low back pain, unspecified   THERAPY DIAG:  Other low back pain  Other abnormalities of gait and mobility  Spinal stenosis of lumbar region with neurogenic claudication  RATIONALE FOR EVALUATION AND TREATMENT: Rehabilitation  ONSET DATE: x 3 years - 2021  NEXT MD VISIT: ~6 weeks   SUBJECTIVE:  SUBJECTIVE STATEMENT: Pt moved a computer up/down some stairs, comes in today with mildly antalgic gait.  PAIN: Are you having pain? Yes: NPRS scale: 4/10 Pain location: low back Pain description: irritation  PERTINENT HISTORY:  H/o of 3 yrs of lower back pain which affects his gait. Had a shot the other day - 1 week ago, which helped for 2 to 3 days. PMH: Chronic LBP, thoracolumbar scoliosis, hypertension, CLL (chronic lymphocytic leukemia), melanoma - back, anxiety, depression, insomnia  PRECAUTIONS: None  WEIGHT BEARING RESTRICTIONS: No  FALLS:  Has patient fallen in last 6 months? No  LIVING ENVIRONMENT: Lives with: lives with their spouse Lives in: House/apartment Stairs: Yes: Internal: 11 steps; can reach both and External: 5 steps; on right going up Has following equipment at home: None  OCCUPATION: works at desk, on computer most of the day   PLOF: Independent  PATIENT GOALS: "get my back strong while I am not  hurting from the shot "   OBJECTIVE: (objective measures completed at initial evaluation unless otherwise dated)  DIAGNOSTIC FINDINGS:  02/07/23 - Lumbar spine MRI: IMPRESSION: At L4-5 there is a broad-based disc bulge. Severe bilateral facet arthropathy and ligamentum flavum infolding. Severe spinal stenosis. No left foraminal stenosis. Severe right foraminal stenosis. At L5-S1 there is a broad-based disc osteophyte complex. Moderate bilateral facet arthropathy with a small right facet effusion. Severe left foraminal stenosis. Mild right foraminal stenosis. At L3-4 there is a broad-based disc bulge. Moderate bilateral facet arthropathy. Mild spinal stenosis. Moderate right and mild left foraminal stenosis. No acute osseous injury of the lumbar spine.  02/07/23 - Thoracic spine MRI: IMPRESSION: At T1-2 there is a minimal broad-based disc osteophyte complex.  Moderate-severe bilateral foraminal stenosis. No acute osseous injury of the thoracic spine. Partially visualized cervical spine spondylosis. If there is further clinical concern, recommend a dedicated MRI of the cervical spine.  PATIENT SURVEYS:  Modified Oswestry 20/50   SCREENING FOR RED FLAGS: Bowel or bladder incontinence: No Spinal tumors: No Cauda equina syndrome: No Compression fracture: No Abdominal aneurysm: No  COGNITION:  Overall cognitive status: Within functional limits for tasks assessed    SENSATION: WFL  MUSCLE LENGTH: Hamstrings: Right 35 deg; Left 35 deg Thomas test: Right WNL deg; Left WNL deg  POSTURE:  decreased lumbar lordosis and R lateral pelvic shift , very straight lumbar region L 1 to S1.  Minimal/can't muscle mass B gluteals  PALPATION: Non tender spinous processes lumbar region, paraspinals, quadratus   LUMBAR ROM:   Active  eval  Flexion 75  Extension 45  Right lateral flexion 35  Left lateral flexion 35  Right rotation   Left rotation   (Blank rows = not tested)  LOWER  EXTREMITY ROM:    all hip, knee, ankle AROM and PROM wnl B   LOWER EXTREMITY MMT:    MMT Right eval Left eval  Hip flexion 4 4  Hip extension 4- 4-  Hip abduction 3+ 3+  Hip adduction    Hip internal rotation    Hip external rotation    Knee flexion    Knee extension 4+ 4+  Ankle dorsiflexion 4 4  Ankle plantarflexion 4 4  Ankle inversion    Ankle eversion     (Blank rows = not tested)  LUMBAR SPECIAL TESTS:  Straight leg raise test: Negative  FUNCTIONAL TESTS:  30 seconds chair stand test 15 reps 6 min walk test  GAIT: Distance walked: 1200' Assistive device utilized: None Level of assistance: SBA  Comments: gait with pronounced B decreased step length, frequent ataxia L LE, tends to plant B forefeet, no heel strike B  FUNCTIONAL TESTS: (03/30/23) 5 times sit to stand: 10.31 sec Timed up and go (TUG): 9.66 sec 10 meter walk test: 8.75 sec Functional gait assessment: 20/30; 19-24 = medium risk fall    TODAY'S TREATMENT:  04/26/23 Therapeutic Exercise: to improve strength and mobility.  Demo, verbal and tactile cues throughout for technique.  Bike L4x96min FGA: 20/30 Supine unilateral pedals with trA x 10 R/L Supine isometric dead bug with orange pball 2x5 with 5 second hold Bird dog x 10 bil Pallof press x 10 each direction Demo of standing HS curls with green TB  04/23/23 Therapeutic Exercise: to improve strength and mobility.  Demo, verbal and tactile cues throughout for technique.  Bike L4x72min Knee flexion 35# 2x10  Knee extension 30# 2x10  Supine bridge 2x10 with ball squeeze Alt knee lift and lower x 10 with TrA brace  TrA with pedals x 5  Standing shoulder extension green TB 2x10  04/18/23 Bike L4x47min Pallof press 5# 2x10 B Standing rows with cable 20# 2x10 Bridge with TrA 2x10 5 sec holds S/L clamshell blue TB 2x10 B Knee flexion 20# 2x10 R/L Knee extension 20# 2x10 R/L  04/12/23: Rec bike level 3 6 min  Seated B knee flexion 45#, 3 sets 10   Seated B long arc quads 45# 3 sets 10 Seated rows 45# 3 sets 10 Lat rows 45#, 3 sets 10 Quadriped B hip ext  15x 3 sec holds , with 2 # cuff weights ankles B  Supine dead bugs alternating with isometric abd cxn  04/11/23 THERAPEUTIC EXERCISE: to improve flexibility, strength and mobility.  Verbal and tactile cues throughout for technique.  Rec Bike - L6 x 6 min B side-stepping with looped blue TB just below knees 4 x 10 ft along counter Fwd/back monster walk with looped blue TB just below knees 4 x 10 ft along counter Counter squat + blue TB hip ABD isometric 2 x 10 Standing TrA + blue TB row 10 x 3" Standing TrA + blue TB scap retraction + shoulder extension 10 x 3" Standing B blue TB pallof press 12 x 3" each R/L Fitter (1 black/1 blue) hip ABD x 15 each R/L Fitter (1 black/1 blue) hip extension x 15 each   04/05/23: Recumbent cycle 6 min level 6 Quadriped bird dog 15x 3 sec holds Prone ex for traps, scapular stabilizer engagement: rows 8# 15 reps Y's 2# 15 reps Shoulder extension, hand palm up 8# 15 reps Hamstring curls B 35#, 2 sets 15 reps cues to slow down for eccentric control Knee ext 45#, 3 sets 12 Lat pulls 45#, 2 sets 20 Standing hip abd alt with t band , blue 20x Standing alt hip ext with blue t band around thighs 20x  Ladder climb against wall, ipsilateral arm and leg, 15 x, lost balance to R  with weight bearing on R LE   04/03/23: Therapeutic exercise: continued with therex progression to address LS spine stability and hip strength Walked in hall way, 3 laps, 900' for tissue perfusion, cardiovascular engagement Supine for pelvic tilts, 5 sec holds, 10 reps Bridging , with theraband around knees and sustained hip abd to engage proximal stabilizers Prone TKE's Prone alt hip and knee ext Quadriped bird dog Seated knee ext b  Seated hamstring curls B 35#, cues to reduce speed and control eccentrics Lat pulls 35#12 x 2 Seated  rows 45# 12 x 2   PATIENT  EDUCATION:  Education details: PT eval findings and initial HEP Person educated: Patient Education method: Explanation, Demonstration, Verbal cues, Handouts, and MedBridgeGO access code provided Education comprehension: verbalized understanding, returned demonstration, verbal cues required, and needs further education  HOME EXERCISE PROGRAM:\ Access Code: [email or web address] URL: [email or web address] Date: 04/26/2023 Prepared by: Verta Ellen  Exercises - Hooklying Hamstring Stretch with Strap  - 1-2 x daily - 7 x weekly - 3 reps - 30 sec hold - Supine Posterior Pelvic Tilt  - 1-2 x daily - 7 x weekly - 2 sets - 10 reps - 5 sec hold - Hooklying Single Leg Bent Knee Fallouts with Resistance  - 1-2 x daily - 7 x weekly - 2 sets - 10 reps - 3 sec hold - Supine Bridge with Resistance Band  - 1-2 x daily - 7 x weekly - 2 sets - 10 reps - 5 sec hold - Hooklying Sequential Leg March and Lower  - 1-2 x daily - 7 x weekly - 2 sets - 10 reps - 3 sec hold - Bird Dog Progression  - 1 x daily - 7 x weekly - 3 sets - 10 reps - Standing Hamstring Curl with Resistance  - 1 x daily - 7 x weekly - 3 sets - 10 reps   ASSESSMENT:  CLINICAL IMPRESSION: Pt with good response to treatment today. Continued working on core stabilization exercises. Pt had question about exercises to target hamstrings, I demonstrated a standing HS curl with green TB for HEP. FGA score remained the same. He showed more antalgic gait today, after moving a computer up and down stairs before he came in. Pain was improved by exercise.  OBJECTIVE IMPAIRMENTS: Abnormal gait, decreased activity tolerance, decreased balance, decreased knowledge of condition, decreased mobility, decreased strength, impaired flexibility, improper body mechanics, postural dysfunction, and pain.   ACTIVITY LIMITATIONS: carrying, lifting, bending, squatting, stairs, and locomotion level  PARTICIPATION LIMITATIONS: cleaning, laundry, shopping, community  activity, and yard work  PERSONAL FACTORS: Age, Fitness, Time since onset of injury/illness/exacerbation, and 1-2 comorbidities: chronic lymphocytic leukemia, anxiety  are also affecting patient's functional outcome.   REHAB POTENTIAL: Good  CLINICAL DECISION MAKING: Stable/uncomplicated  EVALUATION COMPLEXITY: Low   GOALS: Goals reviewed with patient? Yes  SHORT TERM GOALS: Target date: 2 weeks, 04/10/23   I HEP for  Baseline:  Goal status: MET  04/11/23 - Only completing HEP once a day 2x/wk but denies any concerns.  LONG TERM GOALS: Target date: 06/19/23   FGA 27/30   Baseline: 20/30 Goal status: IN PROGRESS- 04/26/23 no change  2.  30 sec sit to stand 17 reps .  Baseline: 15 reps Goal status: IN PROGRESS  3.  Increase strength B hips, quads, plantar flexors to 4+/5  Baseline: 3+ to 4+/5 Goal status: IN PROGRESS  4.  Modified oswestry increase to 30/50  Baseline: 20/50 Goal status: IN PROGRESS   PLAN:  PT FREQUENCY: 2x/week  PT DURATION: 8 weeks  PLANNED INTERVENTIONS: Therapeutic exercises, Therapeutic activity, Neuromuscular re-education, Balance training, Gait training, Patient/Family education, Self Care, and Joint mobilization  PLAN FOR NEXT SESSION: progress note; strengthening on equipment and on mat for hips and core/lumbopelvic musculature; prepare for back operation on 6/27   Darleene Cleaver, PTA 04/26/2023, 3:58 PM

## 2023-04-30 ENCOUNTER — Ambulatory Visit: Payer: Medicare HMO | Admitting: Physical Therapy

## 2023-04-30 ENCOUNTER — Encounter: Payer: Self-pay | Admitting: Physical Therapy

## 2023-04-30 DIAGNOSIS — M48062 Spinal stenosis, lumbar region with neurogenic claudication: Secondary | ICD-10-CM | POA: Diagnosis not present

## 2023-04-30 DIAGNOSIS — R2689 Other abnormalities of gait and mobility: Secondary | ICD-10-CM

## 2023-04-30 DIAGNOSIS — M5459 Other low back pain: Secondary | ICD-10-CM | POA: Diagnosis not present

## 2023-04-30 NOTE — Therapy (Signed)
OUTPATIENT PHYSICAL THERAPY TREATMENT  Progress Note  Reporting Period 03/27/2023 to 04/30/2023   See note below for Objective Data and Assessment of Progress/Goals.     Patient Name: Bobby [name] MRN: [id number] DOB:[birth year], 72 y.o., male Today's Date: 04/30/2023  END OF SESSION:  PT End of Session - 04/30/23 1449     Visit Number 10    Date for PT Re-Evaluation 06/19/23    Authorization Type [employer]    Progress Note Due on Visit 10    PT Start Time 1449    PT Stop Time 1536    PT Time Calculation (min) 47 min    Activity Tolerance Patient tolerated treatment well    Behavior During Therapy [facility] for tasks assessed/performed                  Past Medical History:  Diagnosis Date   Anxiety and depression    Hypertension    Hyponatremia 03/2013   ongoing, seeing Dr. Allena Katz   Insomnia    Melanoma in situ of back [facility])    biopsy on 03/05/2020   SIADH (syndrome of inappropriate ADH production) (HCC) 2017   Dr. Allena Katz   Squamous cell carcinoma in situ    Past Surgical History:  Procedure Laterality Date   BRONCHIAL BIOPSY  11/27/2022   Procedure: BRONCHIAL BIOPSIES;  Surgeon: Leslye Peer, MD;  Location: [facility] ENDOSCOPY;  Service: Cardiopulmonary;;   BRONCHIAL BRUSHINGS  11/27/2022   Procedure: BRONCHIAL BRUSHINGS;  Surgeon: Leslye Peer, MD;  Location: [facility] ENDOSCOPY;  Service: Cardiopulmonary;;   BRONCHIAL WASHINGS  11/27/2022   Procedure: BRONCHIAL WASHINGS;  Surgeon: Leslye Peer, MD;  Location: [facility] ENDOSCOPY;  Service: Cardiopulmonary;;   MOHS SURGERY  ~ 04/2020   face, [facility]   SHOULDER SURGERY  1980s   right shoulder   VIDEO BRONCHOSCOPY N/A 11/27/2022   Procedure: VIDEO BRONCHOSCOPY WITH FLUORO;  Surgeon: Leslye Peer, MD;  Location: [facility] ENDOSCOPY;  Service: Cardiopulmonary;  Laterality: N/A;   Patient Active Problem List   Diagnosis Date Noted   CLL (chronic lymphocytic leukemia) (HCC) 02/21/2023   Abnormal CT of the chest 11/01/2022    Melanoma in situ of back (HCC) 03/16/2020   Squamous cell carcinoma in situ 08/21/2019   Vitamin D deficiency 05/29/2019   Cough 01/01/2016   PCP NOTES >>>> 09/28/2015   Hypogonadism in male 05/13/2015   SIADH (syndrome of inappropriate ADH production) (HCC) 04/13/2013   Memory problem 04/13/2013   Annual physical exam 03/06/2012   THORACOLUMBAR SCOLIOSIS, MILD 08/18/2010   BACK PAIN, LUMBAR 07/21/2010   Anxiety state 04/01/2009   Essential hypertension, benign 01/04/2009   INSOMNIA-SLEEP DISORDER-UNSPEC 09/08/2008    PCP: Wanda Plump, MD   REFERRING PROVIDER: Estill Bamberg, MD   REFERRING DIAG: M54.50 (ICD-10-CM) - Low back pain, unspecified   THERAPY DIAG:  Other low back pain  Other abnormalities of gait and mobility  Spinal stenosis of lumbar region with neurogenic claudication  RATIONALE FOR EVALUATION AND TREATMENT: Rehabilitation  ONSET DATE: x 3 years - 2021  NEXT MD VISIT: ~6 weeks   SUBJECTIVE:  SUBJECTIVE STATEMENT: Pt feels like his stomach and back muscles are getting stronger.  Walking tolerance is better but still not where he wants it to be.  PAIN: Are you having pain? No  PERTINENT HISTORY:  H/o of 3 yrs of lower back pain which affects his gait. Had a shot the other day - 1 week ago, which helped for 2 to 3 days. PMH: Chronic LBP, thoracolumbar scoliosis, hypertension, CLL (chronic lymphocytic leukemia), melanoma - back, anxiety, depression, insomnia  PRECAUTIONS: None  WEIGHT BEARING RESTRICTIONS: No  FALLS:  Has patient fallen in last 6 months? No  LIVING ENVIRONMENT: Lives with: lives with their spouse Lives in: House/apartment Stairs: Yes: Internal: 11 steps; can reach both and External: 5 steps; on right going up Has following equipment at  home: None  OCCUPATION: works at desk, on computer most of the day   PLOF: Independent  PATIENT GOALS: "get my back strong while I am not hurting from the shot "   OBJECTIVE: (objective measures completed at initial evaluation unless otherwise dated)  DIAGNOSTIC FINDINGS:  02/07/23 - Lumbar spine MRI: IMPRESSION: At L4-5 there is a broad-based disc bulge. Severe bilateral facet arthropathy and ligamentum flavum infolding. Severe spinal stenosis. No left foraminal stenosis. Severe right foraminal stenosis. At L5-S1 there is a broad-based disc osteophyte complex. Moderate bilateral facet arthropathy with a small right facet effusion. Severe left foraminal stenosis. Mild right foraminal stenosis. At L3-4 there is a broad-based disc bulge. Moderate bilateral facet arthropathy. Mild spinal stenosis. Moderate right and mild left foraminal stenosis. No acute osseous injury of the lumbar spine.  02/07/23 - Thoracic spine MRI: IMPRESSION: At T1-2 there is a minimal broad-based disc osteophyte complex.  Moderate-severe bilateral foraminal stenosis. No acute osseous injury of the thoracic spine. Partially visualized cervical spine spondylosis. If there is further clinical concern, recommend a dedicated MRI of the cervical spine.  PATIENT SURVEYS:  Modified Oswestry 20/50 ; 04/30/23: 7/50 = 14.0%  SCREENING FOR RED FLAGS: Bowel or bladder incontinence: No Spinal tumors: No Cauda equina syndrome: No Compression fracture: No Abdominal aneurysm: No  COGNITION:  Overall cognitive status: Within functional limits for tasks assessed    SENSATION: WFL  MUSCLE LENGTH: Hamstrings: Right 35 deg; Left 35 deg Thomas test: Right WNL deg; Left WNL deg  POSTURE:  decreased lumbar lordosis and R lateral pelvic shift , very straight lumbar region L 1 to S1.  Minimal/can't muscle mass B gluteals  PALPATION: Non tender spinous processes lumbar region, paraspinals, quadratus   LUMBAR ROM:   Active   eval  Flexion 75  Extension 45  Right lateral flexion 35  Left lateral flexion 35  Right rotation   Left rotation   (Blank rows = not tested)  LOWER EXTREMITY ROM:    all hip, knee, ankle AROM and PROM wnl B   LOWER EXTREMITY MMT:    MMT Right eval Left eval Right 04/30/23 Left 04/30/23  Hip flexion 4 4 5 5   Hip extension 4- 4- 4 4+  Hip abduction 3+ 3+ 4- 4-  Hip adduction   4 4+  Hip internal rotation   5 5  Hip external rotation   4 4  Knee flexion   5 5  Knee extension 4+ 4+ 5 5  Ankle dorsiflexion 4 4 4+ 4+  Ankle plantarflexion 4 4 5 5   Ankle inversion      Ankle eversion       (Blank rows = not tested)  LUMBAR SPECIAL TESTS:  Straight leg raise test: Negative  FUNCTIONAL TESTS:  30 seconds chair stand test 15 reps 6 min walk test  GAIT: Distance walked: 1200' Assistive device utilized: None Level of assistance: SBA Comments: gait with pronounced B decreased step length, frequent ataxia L LE, tends to plant B forefeet, no heel strike B  FUNCTIONAL TESTS: (03/30/23) 5 times sit to stand: 10.31 sec Timed up and go (TUG): 9.66 sec 10 meter walk test: 8.75 sec Functional gait assessment: 20/30; 19-24 = medium risk fall    TODAY'S TREATMENT:   04/30/23 THERAPEUTIC EXERCISE: to improve flexibility, strength and mobility.  Demonstration, verbal and tactile cues throughout for technique.  Rec Bike - L4 x 7 min Bridge + blue TB hip ABD isometric 10 x 5" Bridge + blue TB clam 10 x 5"; cues to maintain bridge elevation with abduction, not allowing hips to drop Standing hip ABD with looped blue TB at lower thighs x 10;  cues to maintain upright posture and avoid substitution with side bending at torso  THERAPEUTIC ACTIVITIES: LE MMT 30 sec STS = 16 reps Modified Oswestry: 7 / 50 = 14.0 % Goal assessment   04/26/23 Therapeutic Exercise: to improve strength and mobility.  Demo, verbal and tactile cues throughout for technique.  Bike L4x69min FGA: 20/30 Supine  unilateral pedals with trA x 10 R/L Supine isometric dead bug with orange pball 2x5 with 5 second hold Bird dog x 10 bil Pallof press x 10 each direction Demo of standing HS curls with green TB   04/23/23 Therapeutic Exercise: to improve strength and mobility.  Demo, verbal and tactile cues throughout for technique.  Bike L4x64min Knee flexion 35# 2x10  Knee extension 30# 2x10  Supine bridge 2x10 with ball squeeze Alt knee lift and lower x 10 with TrA brace  TrA with pedals x 5  Standing shoulder extension green TB 2x10   PATIENT EDUCATION:  Education details: progress with PT, ongoing PT POC, and HEP progression Person educated: Patient Education method: Explanation, Demonstration, Verbal cues, Handouts, and MedBridgeGO access code updated Education comprehension: verbalized understanding, returned demonstration, verbal cues required, and needs further education  HOME EXERCISE PROGRAM:\ Access Code: [email or web address] URL: [email or web address] Date: 04/30/2023 Prepared by: Glenetta Hew  Exercises - Hooklying Hamstring Stretch with Strap  - 1-2 x daily - 7 x weekly - 3 reps - 30 sec hold - Supine Posterior Pelvic Tilt  - 1-2 x daily - 7 x weekly - 2 sets - 10 reps - 5 sec hold - Hooklying Single Leg Bent Knee Fallouts with Resistance  - 1-2 x daily - 7 x weekly - 2 sets - 10 reps - 3 sec hold - Supine Bridge with Resistance Band  - 1-2 x daily - 7 x weekly - 2 sets - 10 reps - 5 sec hold - Hooklying Sequential Leg March and Lower  - 1-2 x daily - 7 x weekly - 2 sets - 10 reps - 3 sec hold - Bird Dog Progression  - 1 x daily - 7 x weekly - 3 sets - 10 reps - Standing Hamstring Curl with Resistance  - 1 x daily - 7 x weekly - 3 sets - 10 reps - Bridge with Hip Abduction and Resistance  - 1 x daily - 7 x weekly - 2 sets - 10 reps - 5 sec hold - Standing Hip Abduction with Resistance at Thighs  - 1 x daily - 7 x weekly - 2 sets - 10 reps - 3 sec  hold   ASSESSMENT:  CLINICAL  IMPRESSION: Iwao Surowiec" notes awareness of improving core (abdominal and back) strength but MMT reveals continued B hip weakness, most prominent in B hip abduction. He was able to complete 1 additional rep during the 30 sec STS but remains 1 rep shy of the goal. His pain remains intermittent, usually only mild in intensity. Modified Oswestry indicates a reduction in perceived disability with current score decreased to 7/50 or 14% disability.  [name] is progressing toward his PT goals, with LTG #4 now met and LTG #3 now partially met.  He reports surgery remains scheduled for 05/17/2023, therefore we will continue to focus on improving lateral hip stability and core strength in time remaining until surgery.  OBJECTIVE IMPAIRMENTS: Abnormal gait, decreased activity tolerance, decreased balance, decreased knowledge of condition, decreased mobility, decreased strength, impaired flexibility, improper body mechanics, postural dysfunction, and pain.   ACTIVITY LIMITATIONS: carrying, lifting, bending, squatting, stairs, and locomotion level  PARTICIPATION LIMITATIONS: cleaning, laundry, shopping, community activity, and yard work  PERSONAL FACTORS: Age, Fitness, Time since onset of injury/illness/exacerbation, and 1-2 comorbidities: chronic lymphocytic leukemia, anxiety  are also affecting patient's functional outcome.   REHAB POTENTIAL: Good  CLINICAL DECISION MAKING: Stable/uncomplicated  EVALUATION COMPLEXITY: Low   GOALS: Goals reviewed with patient? Yes  SHORT TERM GOALS: Target date: 2 weeks, 04/10/23   I HEP for  Baseline:  Goal status: MET  04/11/23 - Only completing HEP once a day 2x/wk but denies any concerns.  LONG TERM GOALS: Target date: 06/19/23   FGA 27/30   Baseline: 20/30 Goal status: IN PROGRESS  04/26/23 - no change  2.  30 sec sit to stand 17 reps .  Baseline: 15 reps Goal status: IN PROGRESS  04/30/23 - 16 reps   3.  Increase strength B hips, quads, plantar flexors to  4+/5  Baseline: 3+ to 4+/5 Goal status: PARTIALLY MET  04/30/23 - Overall strength improved with quads and PF now 5/5, but still with moderate hip weakness  4.  Modified oswestry decrease to 10/50  Baseline: 20/50 Goal status: MET  04/30/23 -  7 / 50 = 14.0 %   PLAN:  PT FREQUENCY: 2x/week  PT DURATION: 8 weeks  PLANNED INTERVENTIONS: Therapeutic exercises, Therapeutic activity, Neuromuscular re-education, Balance training, Gait training, Patient/Family education, Self Care, and Joint mobilization  PLAN FOR NEXT SESSION: strengthening for hips (esp hip abduction, extension and ER) and core/lumbopelvic musculature; prepare for back operation on 6/27 - education on BLT precautions   Marry Guan, PT 04/30/2023, 5:05 PM

## 2023-05-02 ENCOUNTER — Ambulatory Visit: Payer: Medicare HMO

## 2023-05-02 DIAGNOSIS — M5459 Other low back pain: Secondary | ICD-10-CM | POA: Diagnosis not present

## 2023-05-02 DIAGNOSIS — R2689 Other abnormalities of gait and mobility: Secondary | ICD-10-CM

## 2023-05-02 DIAGNOSIS — M48062 Spinal stenosis, lumbar region with neurogenic claudication: Secondary | ICD-10-CM

## 2023-05-02 NOTE — Therapy (Signed)
OUTPATIENT PHYSICAL THERAPY TREATMENT     Patient Name: Bobby [name] MRN: [id number] DOB:01-11-1951, 72 y.o., male Today's Date: 05/02/2023  END OF SESSION:  PT End of Session - 05/02/23 1021     Visit Number 11    Date for PT Re-Evaluation 06/19/23    Authorization Type [employer]    Progress Note Due on Visit 10    PT Start Time 1017    PT Stop Time 1103    PT Time Calculation (min) 46 min    Activity Tolerance Patient tolerated treatment well    Behavior During Therapy WFL for tasks assessed/performed                   Past Medical History:  Diagnosis Date   Anxiety and depression    Hypertension    Hyponatremia 03/2013   ongoing, seeing Dr. Allena Katz   Insomnia    Melanoma in situ of back [facility])    biopsy on 03/05/2020   SIADH (syndrome of inappropriate ADH production) (HCC) 2017   Dr. Allena Katz   Squamous cell carcinoma in situ    Past Surgical History:  Procedure Laterality Date   BRONCHIAL BIOPSY  11/27/2022   Procedure: BRONCHIAL BIOPSIES;  Surgeon: Leslye Peer, MD;  Location: [facility] ENDOSCOPY;  Service: Cardiopulmonary;;   BRONCHIAL BRUSHINGS  11/27/2022   Procedure: BRONCHIAL BRUSHINGS;  Surgeon: Leslye Peer, MD;  Location: [facility] ENDOSCOPY;  Service: Cardiopulmonary;;   BRONCHIAL WASHINGS  11/27/2022   Procedure: BRONCHIAL WASHINGS;  Surgeon: Leslye Peer, MD;  Location: [facility] ENDOSCOPY;  Service: Cardiopulmonary;;   MOHS SURGERY  ~ 04/2020   face, [facility]   SHOULDER SURGERY  1980s   right shoulder   VIDEO BRONCHOSCOPY N/A 11/27/2022   Procedure: VIDEO BRONCHOSCOPY WITH FLUORO;  Surgeon: Leslye Peer, MD;  Location: [facility] ENDOSCOPY;  Service: Cardiopulmonary;  Laterality: N/A;   Patient Active Problem List   Diagnosis Date Noted   CLL (chronic lymphocytic leukemia) (HCC) 02/21/2023   Abnormal CT of the chest 11/01/2022   Melanoma in situ of back (HCC) 03/16/2020   Squamous cell carcinoma in situ 08/21/2019   Vitamin D deficiency 05/29/2019   Cough  01/01/2016   PCP NOTES >>>> 09/28/2015   Hypogonadism in male 05/13/2015   SIADH (syndrome of inappropriate ADH production) (HCC) 04/13/2013   Memory problem 04/13/2013   Annual physical exam 03/06/2012   THORACOLUMBAR SCOLIOSIS, MILD 08/18/2010   BACK PAIN, LUMBAR 07/21/2010   Anxiety state 04/01/2009   Essential hypertension, benign 01/04/2009   INSOMNIA-SLEEP DISORDER-UNSPEC 09/08/2008    PCP: Wanda Plump, MD   REFERRING PROVIDER: Estill Bamberg, MD   REFERRING DIAG: M54.50 (ICD-10-CM) - Low back pain, unspecified   THERAPY DIAG:  Other low back pain  Other abnormalities of gait and mobility  Spinal stenosis of lumbar region with neurogenic claudication  RATIONALE FOR EVALUATION AND TREATMENT: Rehabilitation  ONSET DATE: x 3 years - 2021  NEXT MD VISIT: ~6 weeks   SUBJECTIVE:  SUBJECTIVE STATEMENT:   PAIN: Are you having pain? No  PERTINENT HISTORY:  H/o of 3 yrs of lower back pain which affects his gait. Had a shot the other day - 1 week ago, which helped for 2 to 3 days. PMH: Chronic LBP, thoracolumbar scoliosis, hypertension, CLL (chronic lymphocytic leukemia), melanoma - back, anxiety, depression, insomnia  PRECAUTIONS: None  WEIGHT BEARING RESTRICTIONS: No  FALLS:  Has patient fallen in last 6 months? No  LIVING ENVIRONMENT: Lives with: lives with their spouse Lives in: House/apartment Stairs: Yes: Internal: 11 steps; can reach both and External: 5 steps; on right going up Has following equipment at home: None  OCCUPATION: works at desk, on computer most of the day   PLOF: Independent  PATIENT GOALS: "get my back strong while I am not hurting from the shot "   OBJECTIVE: (objective measures completed at initial evaluation unless otherwise  dated)  DIAGNOSTIC FINDINGS:  02/07/23 - Lumbar spine MRI: IMPRESSION: At L4-5 there is a broad-based disc bulge. Severe bilateral facet arthropathy and ligamentum flavum infolding. Severe spinal stenosis. No left foraminal stenosis. Severe right foraminal stenosis. At L5-S1 there is a broad-based disc osteophyte complex. Moderate bilateral facet arthropathy with a small right facet effusion. Severe left foraminal stenosis. Mild right foraminal stenosis. At L3-4 there is a broad-based disc bulge. Moderate bilateral facet arthropathy. Mild spinal stenosis. Moderate right and mild left foraminal stenosis. No acute osseous injury of the lumbar spine.  02/07/23 - Thoracic spine MRI: IMPRESSION: At T1-2 there is a minimal broad-based disc osteophyte complex.  Moderate-severe bilateral foraminal stenosis. No acute osseous injury of the thoracic spine. Partially visualized cervical spine spondylosis. If there is further clinical concern, recommend a dedicated MRI of the cervical spine.  PATIENT SURVEYS:  Modified Oswestry 20/50 ; 04/30/23: 7/50 = 14.0%  SCREENING FOR RED FLAGS: Bowel or bladder incontinence: No Spinal tumors: No Cauda equina syndrome: No Compression fracture: No Abdominal aneurysm: No  COGNITION:  Overall cognitive status: Within functional limits for tasks assessed    SENSATION: WFL  MUSCLE LENGTH: Hamstrings: Right 35 deg; Left 35 deg Thomas test: Right WNL deg; Left WNL deg  POSTURE:  decreased lumbar lordosis and R lateral pelvic shift , very straight lumbar region L 1 to S1.  Minimal/can't muscle mass B gluteals  PALPATION: Non tender spinous processes lumbar region, paraspinals, quadratus   LUMBAR ROM:   Active  eval  Flexion 75  Extension 45  Right lateral flexion 35  Left lateral flexion 35  Right rotation   Left rotation   (Blank rows = not tested)  LOWER EXTREMITY ROM:    all hip, knee, ankle AROM and PROM wnl B   LOWER EXTREMITY MMT:    MMT  Right eval Left eval Right 04/30/23 Left 04/30/23  Hip flexion 4 4 5 5   Hip extension 4- 4- 4 4+  Hip abduction 3+ 3+ 4- 4-  Hip adduction   4 4+  Hip internal rotation   5 5  Hip external rotation   4 4  Knee flexion   5 5  Knee extension 4+ 4+ 5 5  Ankle dorsiflexion 4 4 4+ 4+  Ankle plantarflexion 4 4 5 5   Ankle inversion      Ankle eversion       (Blank rows = not tested)  LUMBAR SPECIAL TESTS:  Straight leg raise test: Negative  FUNCTIONAL TESTS:  30 seconds chair stand test 15 reps 6 min walk test  GAIT: Distance walked:  1200' Assistive device utilized: None Level of assistance: SBA Comments: gait with pronounced B decreased step length, frequent ataxia L LE, tends to plant B forefeet, no heel strike B  FUNCTIONAL TESTS: (03/30/23) 5 times sit to stand: 10.31 sec Timed up and go (TUG): 9.66 sec 10 meter walk test: 8.75 sec Functional gait assessment: 20/30; 19-24 = medium risk fall    TODAY'S TREATMENT:  05/02/23 THERAPEUTIC EXERCISE: to improve flexibility, strength and mobility.  Demonstration, verbal and tactile cues throughout for technique.  UBE L2.0 x 8 min Supine LTR x 10 bil Supine ER/IR x 5 bil Supine heel slide x 10  Standing hip extension 2# x 20 reps  Standing hip abduction 2# x 20 reps Single RDL bil x 10  Wall slides with staggered stance for trunk and hip extension x 5 04/30/23 THERAPEUTIC EXERCISE: to improve flexibility, strength and mobility.  Demonstration, verbal and tactile cues throughout for technique.  Rec Bike - L4 x 7 min Bridge + blue TB hip ABD isometric 10 x 5" Bridge + blue TB clam 10 x 5"; cues to maintain bridge elevation with abduction, not allowing hips to drop Standing hip ABD with looped blue TB at lower thighs x 10;  cues to maintain upright posture and avoid substitution with side bending at torso  THERAPEUTIC ACTIVITIES: LE MMT 30 sec STS = 16 reps Modified Oswestry: 7 / 50 = 14.0 % Goal  assessment   04/26/23 Therapeutic Exercise: to improve strength and mobility.  Demo, verbal and tactile cues throughout for technique.  Bike L4x62min FGA: 20/30 Supine unilateral pedals with trA x 10 R/L Supine isometric dead bug with orange pball 2x5 with 5 second hold Bird dog x 10 bil Pallof press x 10 each direction Demo of standing HS curls with green TB   04/23/23 Therapeutic Exercise: to improve strength and mobility.  Demo, verbal and tactile cues throughout for technique.  Bike L4x23min Knee flexion 35# 2x10  Knee extension 30# 2x10  Supine bridge 2x10 with ball squeeze Alt knee lift and lower x 10 with TrA brace  TrA with pedals x 5  Standing shoulder extension green TB 2x10   PATIENT EDUCATION:  Education details: HEP progression- hip extension and abduction; educated that he could use cuff weights at home but to don/doff when sitting in front of counter Person educated: Patient Education method: Explanation, Demonstration, Verbal cues, Handouts, and MedBridgeGO access code updated Education comprehension: verbalized understanding, returned demonstration, verbal cues required, and needs further education  HOME EXERCISE PROGRAM:\ Access Code: [email or web address] URL: [email or web address] Date: 04/30/2023 Prepared by: Glenetta Hew  Exercises - Hooklying Hamstring Stretch with Strap  - 1-2 x daily - 7 x weekly - 3 reps - 30 sec hold - Supine Posterior Pelvic Tilt  - 1-2 x daily - 7 x weekly - 2 sets - 10 reps - 5 sec hold - Hooklying Single Leg Bent Knee Fallouts with Resistance  - 1-2 x daily - 7 x weekly - 2 sets - 10 reps - 3 sec hold - Supine Bridge with Resistance Band  - 1-2 x daily - 7 x weekly - 2 sets - 10 reps - 5 sec hold - Hooklying Sequential Leg March and Lower  - 1-2 x daily - 7 x weekly - 2 sets - 10 reps - 3 sec hold - Bird Dog Progression  - 1 x daily - 7 x weekly - 3 sets - 10 reps - Standing Hamstring Curl with Resistance  - 1 x  daily - 7 x weekly  - 3 sets - 10 reps - Bridge with Hip Abduction and Resistance  - 1 x daily - 7 x weekly - 2 sets - 10 reps - 5 sec hold - Standing Hip Abduction with Resistance at Thighs  - 1 x daily - 7 x weekly - 2 sets - 10 reps - 3 sec hold   ASSESSMENT:  CLINICAL IMPRESSION: Pt continues to show weakness in hips with gait, taking short strides and a shuffle like pattern. Progressed hip strengthening today as well as working on stretching to gain more hip extension. May need to practice gait training with emphasize on hip extension and longer strides. Difficulty shown with single leg RDL on R side, with more cues to hinge hips needed.  OBJECTIVE IMPAIRMENTS: Abnormal gait, decreased activity tolerance, decreased balance, decreased knowledge of condition, decreased mobility, decreased strength, impaired flexibility, improper body mechanics, postural dysfunction, and pain.   ACTIVITY LIMITATIONS: carrying, lifting, bending, squatting, stairs, and locomotion level  PARTICIPATION LIMITATIONS: cleaning, laundry, shopping, community activity, and yard work  PERSONAL FACTORS: Age, Fitness, Time since onset of injury/illness/exacerbation, and 1-2 comorbidities: chronic lymphocytic leukemia, anxiety  are also affecting patient's functional outcome.   REHAB POTENTIAL: Good  CLINICAL DECISION MAKING: Stable/uncomplicated  EVALUATION COMPLEXITY: Low   GOALS: Goals reviewed with patient? Yes  SHORT TERM GOALS: Target date: 2 weeks, 04/10/23   I HEP for  Baseline:  Goal status: MET  04/11/23 - Only completing HEP once a day 2x/wk but denies any concerns.  LONG TERM GOALS: Target date: 06/19/23   FGA 27/30   Baseline: 20/30 Goal status: IN PROGRESS  04/26/23 - no change  2.  30 sec sit to stand 17 reps .  Baseline: 15 reps Goal status: IN PROGRESS  04/30/23 - 16 reps   3.  Increase strength B hips, quads, plantar flexors to 4+/5  Baseline: 3+ to 4+/5 Goal status: PARTIALLY MET  04/30/23 - Overall  strength improved with quads and PF now 5/5, but still with moderate hip weakness  4.  Modified oswestry decrease to 10/50  Baseline: 20/50 Goal status: MET  04/30/23 -  7 / 50 = 14.0 %   PLAN:  PT FREQUENCY: 2x/week  PT DURATION: 8 weeks  PLANNED INTERVENTIONS: Therapeutic exercises, Therapeutic activity, Neuromuscular re-education, Balance training, Gait training, Patient/Family education, Self Care, and Joint mobilization  PLAN FOR NEXT SESSION: strengthening for hips (esp hip abduction, extension and ER) and core/lumbopelvic musculature; prepare for back operation on 6/27 - education on BLT precautions   Darleene Cleaver, PTA 05/02/2023, 11:58 AM

## 2023-05-07 ENCOUNTER — Ambulatory Visit: Payer: Medicare HMO

## 2023-05-07 DIAGNOSIS — M48062 Spinal stenosis, lumbar region with neurogenic claudication: Secondary | ICD-10-CM

## 2023-05-07 DIAGNOSIS — M5459 Other low back pain: Secondary | ICD-10-CM

## 2023-05-07 DIAGNOSIS — R2689 Other abnormalities of gait and mobility: Secondary | ICD-10-CM | POA: Diagnosis not present

## 2023-05-07 NOTE — Pre-Procedure Instructions (Signed)
Surgical Instructions    Your procedure is scheduled on May 17, 2023.  Report to [facility] Main Entrance "A" at 5:30 A.M., then check in with the Admitting office.  Call this number if you have problems the morning of surgery:  [phone]  If you have any questions prior to your surgery date call [phone]: Open Monday-Friday 8am-4pm If you experience any cold or flu symptoms such as cough, fever, chills, shortness of breath, etc. between now and your scheduled surgery, please notify us at the above number.     Remember:  Do not eat after midnight the night before your surgery  You may drink clear liquids until 4:30 AM the morning of your surgery.   Clear liquids allowed are: Water, Non-Citrus Juices (without pulp), Carbonated Beverages, Clear Tea, Black Coffee Only (NO MILK, CREAM OR POWDERED CREAMER of any kind), and Gatorade.  Patient Instructions  The night before surgery:  No food after midnight. ONLY clear liquids after midnight  The day of surgery (if you do NOT have diabetes):  Drink ONE (1) Pre-Surgery Clear Ensure by 4:30 AM the morning of surgery. Drink in one sitting. Do not sip.  This drink was given to you during your hospital  pre-op appointment visit.  Nothing else to drink after completing the  Pre-Surgery Clear Ensure.         If you have questions, please contact your surgeon's office.     Take these medicines the morning of surgery with A SIP OF WATER:  carvedilol (COREG)   clonazePAM (KLONOPIN) - may take if needed    As of today, STOP taking any Aspirin (unless otherwise instructed by your surgeon) Aleve, Naproxen, Ibuprofen, Motrin, Advil, Goody's, BC's, all herbal medications, fish oil, and all vitamins.                     Do NOT Smoke (Tobacco/Vaping) for 24 hours prior to your procedure.  If you use a CPAP at night, you may bring your mask/headgear for your overnight stay.   Contacts, glasses, piercing's, hearing aid's, dentures or  partials may not be worn into surgery, please bring cases for these belongings.    For patients admitted to the hospital, discharge time will be determined by your treatment team.   Patients discharged the day of surgery will not be allowed to drive home, and someone needs to stay with them for 24 hours.  SURGICAL WAITING ROOM VISITATION Patients having surgery or a procedure may have no more than 2 support people in the waiting area - these visitors may rotate.   Children under the age of 74 must have an adult with them who is not the patient. If the patient needs to stay at the hospital during part of their recovery, the visitor guidelines for inpatient rooms apply. Pre-op nurse will coordinate an appropriate time for 1 support person to accompany patient in pre-op.  This support person may not rotate.   Please refer to the [facility] website for the visitor guidelines for Inpatients (after your surgery is over and you are in a regular room).   If you received a COVID test during your pre-op visit  it is requested that you wear a mask when out in public, stay away from anyone that may not be feeling well and notify your surgeon if you develop symptoms. If you have been in contact with anyone that has tested positive in the last 10 days please notify you surgeon.  Pre-operative 5 CHG Bath Instructions   You can play a key role in reducing the risk of infection after surgery. Your skin needs to be as free of germs as possible. You can reduce the number of germs on your skin by washing with CHG (chlorhexidine gluconate) soap before surgery. CHG is an antiseptic soap that kills germs and continues to kill germs even after washing.   DO NOT use if you have an allergy to chlorhexidine/CHG or antibacterial soaps. If your skin becomes reddened or irritated, stop using the CHG and notify one of our RNs at [phone].   Please shower with the CHG soap starting 4 days before surgery using the  following schedule:     Please keep in mind the following:  DO NOT shave, including legs and underarms, starting the day of your first shower.   You may shave your face at any point before/day of surgery.  Place clean sheets on your bed the day you start using CHG soap. Use a clean washcloth (not used since being washed) for each shower. DO NOT sleep with pets once you start using the CHG.   CHG Shower Instructions:  If you choose to wash your hair and private area, wash first with your normal shampoo/soap.  After you use shampoo/soap, rinse your hair and body thoroughly to remove shampoo/soap residue.  Turn the water OFF and apply about 3 tablespoons (45 ml) of CHG soap to a CLEAN washcloth.  Apply CHG soap ONLY FROM YOUR NECK DOWN TO YOUR TOES (washing for 3-5 minutes)  DO NOT use CHG soap on face, private areas, open wounds, or sores.  Pay special attention to the area where your surgery is being performed.  If you are having back surgery, having someone wash your back for you may be helpful. Wait 2 minutes after CHG soap is applied, then you may rinse off the CHG soap.  Pat dry with a clean towel  Put on clean clothes/pajamas   If you choose to wear lotion, please use ONLY the CHG-compatible lotions on the back of this paper.     Additional instructions for the day of surgery: DO NOT APPLY any lotions, deodorants, cologne, or perfumes.   Do not wear jewelry or makeup Do not wear nail polish, gel polish, artificial nails, or any other type of covering on natural nails (fingers and toes) Do not bring valuables to the hospital. [facility] is not responsible for any belongings or valuables. Put on clean/comfortable clothes.  Brush your teeth.  Ask your nurse before applying any prescription medications to the skin.      CHG Compatible Lotions   Aveeno Moisturizing lotion  Cetaphil Moisturizing Cream  Cetaphil Moisturizing Lotion  Clairol Herbal Essence Moisturizing Lotion,  Dry Skin  Clairol Herbal Essence Moisturizing Lotion, Extra Dry Skin  Clairol Herbal Essence Moisturizing Lotion, Normal Skin  Curel Age Defying Therapeutic Moisturizing Lotion with Alpha Hydroxy  Curel Extreme Care Body Lotion  Curel Soothing Hands Moisturizing Hand Lotion  Curel Therapeutic Moisturizing Cream, Fragrance-Free  Curel Therapeutic Moisturizing Lotion, Fragrance-Free  Curel Therapeutic Moisturizing Lotion, Original Formula  Eucerin Daily Replenishing Lotion  Eucerin Dry Skin Therapy Plus Alpha Hydroxy Crme  Eucerin Dry Skin Therapy Plus Alpha Hydroxy Lotion  Eucerin Original Crme  Eucerin Original Lotion  Eucerin Plus Crme Eucerin Plus Lotion  Eucerin TriLipid Replenishing Lotion  Keri Anti-Bacterial Hand Lotion  Keri Deep Conditioning Original Lotion Dry Skin Formula Softly Scented  Keri Deep Conditioning Original Lotion, Fragrance Free  Sensitive Skin Formula  Keri Lotion Fast Absorbing Fragrance Free Sensitive Skin Formula  Keri Lotion Fast Absorbing Softly Scented Dry Skin Formula  Keri Original Lotion  Keri Skin Renewal Lotion Keri Silky Smooth Lotion  Keri Silky Smooth Sensitive Skin Lotion  Nivea Body Creamy Conditioning Oil  Nivea Body Extra Enriched Lotion  Nivea Body Original Lotion  Nivea Body Sheer Moisturizing Lotion Nivea Crme  Nivea Skin Firming Lotion  NutraDerm 30 Skin Lotion  NutraDerm Skin Lotion  NutraDerm Therapeutic Skin Cream  NutraDerm Therapeutic Skin Lotion  ProShield Protective Hand Cream  Provon moisturizing lotion  Please read over the following fact sheets that you were given.

## 2023-05-07 NOTE — Therapy (Signed)
OUTPATIENT PHYSICAL THERAPY TREATMENT     Patient Name: Bobby [name] MRN: [id number] DOB:1951-02-13, 72 y.o., male Today's Date: 05/07/2023  END OF SESSION:  PT End of Session - 05/07/23 1108     Visit Number 12    Date for PT Re-Evaluation 06/19/23    Authorization Type [employer]    PT Start Time 1101    PT Stop Time 1145    PT Time Calculation (min) 44 min    Activity Tolerance Patient tolerated treatment well    Behavior During Therapy [facility] for tasks assessed/performed                   Past Medical History:  Diagnosis Date   Anxiety and depression    Hypertension    Hyponatremia 03/2013   ongoing, seeing Dr. Allena Katz   Insomnia    Melanoma in situ of back [facility])    biopsy on 03/05/2020   SIADH (syndrome of inappropriate ADH production) (HCC) 2017   Dr. Allena Katz   Squamous cell carcinoma in situ    Past Surgical History:  Procedure Laterality Date   BRONCHIAL BIOPSY  11/27/2022   Procedure: BRONCHIAL BIOPSIES;  Surgeon: Leslye Peer, MD;  Location: [facility] ENDOSCOPY;  Service: Cardiopulmonary;;   BRONCHIAL BRUSHINGS  11/27/2022   Procedure: BRONCHIAL BRUSHINGS;  Surgeon: Leslye Peer, MD;  Location: [facility] ENDOSCOPY;  Service: Cardiopulmonary;;   BRONCHIAL WASHINGS  11/27/2022   Procedure: BRONCHIAL WASHINGS;  Surgeon: Leslye Peer, MD;  Location: [facility] ENDOSCOPY;  Service: Cardiopulmonary;;   MOHS SURGERY  ~ 04/2020   face, [facility]   SHOULDER SURGERY  1980s   right shoulder   VIDEO BRONCHOSCOPY N/A 11/27/2022   Procedure: VIDEO BRONCHOSCOPY WITH FLUORO;  Surgeon: Leslye Peer, MD;  Location: [facility] ENDOSCOPY;  Service: Cardiopulmonary;  Laterality: N/A;   Patient Active Problem List   Diagnosis Date Noted   CLL (chronic lymphocytic leukemia) (HCC) 02/21/2023   Abnormal CT of the chest 11/01/2022   Melanoma in situ of back (HCC) 03/16/2020   Squamous cell carcinoma in situ 08/21/2019   Vitamin D deficiency 05/29/2019   Cough 01/01/2016   PCP NOTES >>>>  09/28/2015   Hypogonadism in male 05/13/2015   SIADH (syndrome of inappropriate ADH production) (HCC) 04/13/2013   Memory problem 04/13/2013   Annual physical exam 03/06/2012   THORACOLUMBAR SCOLIOSIS, MILD 08/18/2010   BACK PAIN, LUMBAR 07/21/2010   Anxiety state 04/01/2009   Essential hypertension, benign 01/04/2009   INSOMNIA-SLEEP DISORDER-UNSPEC 09/08/2008    PCP: Wanda Plump, MD   REFERRING PROVIDER: Estill Bamberg, MD   REFERRING DIAG: M54.50 (ICD-10-CM) - Low back pain, unspecified   THERAPY DIAG:  Other low back pain  Other abnormalities of gait and mobility  Spinal stenosis of lumbar region with neurogenic claudication  RATIONALE FOR EVALUATION AND TREATMENT: Rehabilitation  ONSET DATE: x 3 years - 2021  NEXT MD VISIT: ~6 weeks   SUBJECTIVE:  SUBJECTIVE STATEMENT: Doing good, walked about 15,000 steps on Friday but didn't do too bad afterward.  PAIN: Are you having pain? No  PERTINENT HISTORY:  H/o of 3 yrs of lower back pain which affects his gait. Had a shot the other day - 1 week ago, which helped for 2 to 3 days. PMH: Chronic LBP, thoracolumbar scoliosis, hypertension, CLL (chronic lymphocytic leukemia), melanoma - back, anxiety, depression, insomnia  PRECAUTIONS: None  WEIGHT BEARING RESTRICTIONS: No  FALLS:  Has patient fallen in last 6 months? No  LIVING ENVIRONMENT: Lives with: lives with their spouse Lives in: House/apartment Stairs: Yes: Internal: 11 steps; can reach both and External: 5 steps; on right going up Has following equipment at home: None  OCCUPATION: works at desk, on computer most of the day   PLOF: Independent  PATIENT GOALS: "get my back strong while I am not hurting from the shot "   OBJECTIVE: (objective measures  completed at initial evaluation unless otherwise dated)  DIAGNOSTIC FINDINGS:  02/07/23 - Lumbar spine MRI: IMPRESSION: At L4-5 there is a broad-based disc bulge. Severe bilateral facet arthropathy and ligamentum flavum infolding. Severe spinal stenosis. No left foraminal stenosis. Severe right foraminal stenosis. At L5-S1 there is a broad-based disc osteophyte complex. Moderate bilateral facet arthropathy with a small right facet effusion. Severe left foraminal stenosis. Mild right foraminal stenosis. At L3-4 there is a broad-based disc bulge. Moderate bilateral facet arthropathy. Mild spinal stenosis. Moderate right and mild left foraminal stenosis. No acute osseous injury of the lumbar spine.  02/07/23 - Thoracic spine MRI: IMPRESSION: At T1-2 there is a minimal broad-based disc osteophyte complex.  Moderate-severe bilateral foraminal stenosis. No acute osseous injury of the thoracic spine. Partially visualized cervical spine spondylosis. If there is further clinical concern, recommend a dedicated MRI of the cervical spine.  PATIENT SURVEYS:  Modified Oswestry 20/50 ; 04/30/23: 7/50 = 14.0%  SCREENING FOR RED FLAGS: Bowel or bladder incontinence: No Spinal tumors: No Cauda equina syndrome: No Compression fracture: No Abdominal aneurysm: No  COGNITION:  Overall cognitive status: Within functional limits for tasks assessed    SENSATION: WFL  MUSCLE LENGTH: Hamstrings: Right 35 deg; Left 35 deg Thomas test: Right WNL deg; Left WNL deg  POSTURE:  decreased lumbar lordosis and R lateral pelvic shift , very straight lumbar region L 1 to S1.  Minimal/can't muscle mass B gluteals  PALPATION: Non tender spinous processes lumbar region, paraspinals, quadratus   LUMBAR ROM:   Active  eval  Flexion 75  Extension 45  Right lateral flexion 35  Left lateral flexion 35  Right rotation   Left rotation   (Blank rows = not tested)  LOWER EXTREMITY ROM:    all hip, knee, ankle AROM  and PROM wnl B   LOWER EXTREMITY MMT:    MMT Right eval Left eval Right 04/30/23 Left 04/30/23  Hip flexion 4 4 5 5   Hip extension 4- 4- 4 4+  Hip abduction 3+ 3+ 4- 4-  Hip adduction   4 4+  Hip internal rotation   5 5  Hip external rotation   4 4  Knee flexion   5 5  Knee extension 4+ 4+ 5 5  Ankle dorsiflexion 4 4 4+ 4+  Ankle plantarflexion 4 4 5 5   Ankle inversion      Ankle eversion       (Blank rows = not tested)  LUMBAR SPECIAL TESTS:  Straight leg raise test: Negative  FUNCTIONAL TESTS:  30 seconds  chair stand test 15 reps 6 min walk test  GAIT: Distance walked: 1200' Assistive device utilized: None Level of assistance: SBA Comments: gait with pronounced B decreased step length, frequent ataxia L LE, tends to plant B forefeet, no heel strike B  FUNCTIONAL TESTS: (03/30/23) 5 times sit to stand: 10.31 sec Timed up and go (TUG): 9.66 sec 10 meter walk test: 8.75 sec Functional gait assessment: 20/30; 19-24 = medium risk fall    TODAY'S TREATMENT:  05/07/23 THERAPEUTIC EXERCISE: to improve flexibility, strength and mobility.  Demonstration, verbal and tactile cues throughout for technique.  Rec Bike L3x40min Standing hip abduction x 10 3# bil Standing hip extension x 10 3# bil Sidestep 3# weights  Hip hikes 2x10 bil Pallof press 10# x 20; 12# x 20  Alt LE extension with hand touches with yellow weight ball  05/02/23 THERAPEUTIC EXERCISE: to improve flexibility, strength and mobility.  Demonstration, verbal and tactile cues throughout for technique.  UBE L2.0 x 8 min Supine LTR x 10 bil Supine ER/IR x 5 bil Supine heel slide x 10  Standing hip extension 2# x 20 reps  Standing hip abduction 2# x 20 reps Single RDL bil x 10  Wall slides with staggered stance for trunk and hip extension x 5 04/30/23 THERAPEUTIC EXERCISE: to improve flexibility, strength and mobility.  Demonstration, verbal and tactile cues throughout for technique.  Rec Bike - L4 x 7  min Bridge + blue TB hip ABD isometric 10 x 5" Bridge + blue TB clam 10 x 5"; cues to maintain bridge elevation with abduction, not allowing hips to drop Standing hip ABD with looped blue TB at lower thighs x 10;  cues to maintain upright posture and avoid substitution with side bending at torso  THERAPEUTIC ACTIVITIES: LE MMT 30 sec STS = 16 reps Modified Oswestry: 7 / 50 = 14.0 % Goal assessment   04/26/23 Therapeutic Exercise: to improve strength and mobility.  Demo, verbal and tactile cues throughout for technique.  Bike L4x32min FGA: 20/30 Supine unilateral pedals with trA x 10 R/L Supine isometric dead bug with orange pball 2x5 with 5 second hold Bird dog x 10 bil Pallof press x 10 each direction Demo of standing HS curls with green TB   04/23/23 Therapeutic Exercise: to improve strength and mobility.  Demo, verbal and tactile cues throughout for technique.  Bike L4x65min Knee flexion 35# 2x10  Knee extension 30# 2x10  Supine bridge 2x10 with ball squeeze Alt knee lift and lower x 10 with TrA brace  TrA with pedals x 5  Standing shoulder extension green TB 2x10   PATIENT EDUCATION:  Education details: HEP progression- hip extension and abduction; educated that he could use cuff weights at home but to don/doff when sitting in front of counter Person educated: Patient Education method: Explanation, Demonstration, Verbal cues, Handouts, and MedBridgeGO access code updated Education comprehension: verbalized understanding, returned demonstration, verbal cues required, and needs further education  HOME EXERCISE PROGRAM:\ Access Code: [email or web address] URL: [email or web address] Date: 04/30/2023 Prepared by: Glenetta Hew  Exercises - Hooklying Hamstring Stretch with Strap  - 1-2 x daily - 7 x weekly - 3 reps - 30 sec hold - Supine Posterior Pelvic Tilt  - 1-2 x daily - 7 x weekly - 2 sets - 10 reps - 5 sec hold - Hooklying Single Leg Bent Knee Fallouts with Resistance   - 1-2 x daily - 7 x weekly - 2 sets - 10 reps - 3 sec hold - Supine  Bridge with Resistance Band  - 1-2 x daily - 7 x weekly - 2 sets - 10 reps - 5 sec hold - Hooklying Sequential Leg March and Lower  - 1-2 x daily - 7 x weekly - 2 sets - 10 reps - 3 sec hold - Bird Dog Progression  - 1 x daily - 7 x weekly - 3 sets - 10 reps - Standing Hamstring Curl with Resistance  - 1 x daily - 7 x weekly - 3 sets - 10 reps  - Bridge with Hip Abduction and Resistance  - 1 x daily - 7 x weekly - 2 sets - 10 reps - 5 sec hold - Standing Hip Abduction with Resistance at Thighs  - 1 x daily - 7 x weekly - 2 sets - 10 reps - 3 sec hold   ASSESSMENT:  CLINICAL IMPRESSION: Progressed exercises to improve hip/lumbar strength and ROM. Pt initially walked in with shortened strides, antalgic gait, post session he showed a better walking pattern but not getting a lot of hip extension going into heel off. He shows continued weakness in hips, evident by compensation with hip abduction and extension. Spoke with him about the importance of doing HEP for most benefits with PT. Overall he shows good improvement and would continue to benefit from skilled PT.   OBJECTIVE IMPAIRMENTS: Abnormal gait, decreased activity tolerance, decreased balance, decreased knowledge of condition, decreased mobility, decreased strength, impaired flexibility, improper body mechanics, postural dysfunction, and pain.   ACTIVITY LIMITATIONS: carrying, lifting, bending, squatting, stairs, and locomotion level  PARTICIPATION LIMITATIONS: cleaning, laundry, shopping, community activity, and yard work  PERSONAL FACTORS: Age, Fitness, Time since onset of injury/illness/exacerbation, and 1-2 comorbidities: chronic lymphocytic leukemia, anxiety  are also affecting patient's functional outcome.   REHAB POTENTIAL: Good  CLINICAL DECISION MAKING: Stable/uncomplicated  EVALUATION COMPLEXITY: Low   GOALS: Goals reviewed with patient? Yes  SHORT TERM  GOALS: Target date: 2 weeks, 04/10/23   I HEP for  Baseline:  Goal status: MET  04/11/23 - Only completing HEP once a day 2x/wk but denies any concerns.  LONG TERM GOALS: Target date: 06/19/23   FGA 27/30   Baseline: 20/30 Goal status: IN PROGRESS  04/26/23 - no change  2.  30 sec sit to stand 17 reps .  Baseline: 15 reps Goal status: IN PROGRESS  04/30/23 - 16 reps   3.  Increase strength B hips, quads, plantar flexors to 4+/5  Baseline: 3+ to 4+/5 Goal status: PARTIALLY MET  04/30/23 - Overall strength improved with quads and PF now 5/5, but still with moderate hip weakness  4.  Modified oswestry decrease to 10/50  Baseline: 20/50 Goal status: MET  04/30/23 -  7 / 50 = 14.0 %   PLAN:  PT FREQUENCY: 2x/week  PT DURATION: 8 weeks  PLANNED INTERVENTIONS: Therapeutic exercises, Therapeutic activity, Neuromuscular re-education, Balance training, Gait training, Patient/Family education, Self Care, and Joint mobilization  PLAN FOR NEXT SESSION: strengthening for hips (esp hip abduction, extension and ER) and core/lumbopelvic musculature; prepare for back operation on 6/27 - education on BLT precautions   Darleene Cleaver, PTA 05/07/2023, 11:53 AM

## 2023-05-08 ENCOUNTER — Encounter (HOSPITAL_COMMUNITY)
Admission: RE | Admit: 2023-05-08 | Discharge: 2023-05-08 | Disposition: A | Discharge status: Home / Self Care | Payer: Medicare HMO | Source: Ambulatory Visit | Attending: Orthopedic Surgery | Admitting: Orthopedic Surgery

## 2023-05-08 ENCOUNTER — Other Ambulatory Visit: Payer: Self-pay

## 2023-05-08 ENCOUNTER — Telehealth: Payer: Self-pay | Admitting: Internal Medicine

## 2023-05-08 ENCOUNTER — Encounter (HOSPITAL_COMMUNITY): Payer: Self-pay

## 2023-05-08 VITALS — BP 156/74 | HR 57 | Temp 97.6°F | Resp 18 | Ht 72.0 in | Wt 176.5 lb

## 2023-05-08 DIAGNOSIS — D649 Anemia, unspecified: Secondary | ICD-10-CM | POA: Diagnosis not present

## 2023-05-08 DIAGNOSIS — I10 Essential (primary) hypertension: Secondary | ICD-10-CM | POA: Insufficient documentation

## 2023-05-08 DIAGNOSIS — K5730 Diverticulosis of large intestine without perforation or abscess without bleeding: Secondary | ICD-10-CM | POA: Diagnosis not present

## 2023-05-08 DIAGNOSIS — I2510 Atherosclerotic heart disease of native coronary artery without angina pectoris: Secondary | ICD-10-CM | POA: Insufficient documentation

## 2023-05-08 DIAGNOSIS — D72829 Elevated white blood cell count, unspecified: Secondary | ICD-10-CM | POA: Insufficient documentation

## 2023-05-08 DIAGNOSIS — J479 Bronchiectasis, uncomplicated: Secondary | ICD-10-CM | POA: Insufficient documentation

## 2023-05-08 DIAGNOSIS — Z01812 Encounter for preprocedural laboratory examination: Secondary | ICD-10-CM | POA: Diagnosis not present

## 2023-05-08 DIAGNOSIS — K449 Diaphragmatic hernia without obstruction or gangrene: Secondary | ICD-10-CM | POA: Diagnosis not present

## 2023-05-08 DIAGNOSIS — I700 Atherosclerosis of aorta: Secondary | ICD-10-CM | POA: Insufficient documentation

## 2023-05-08 DIAGNOSIS — Z01818 Encounter for other preprocedural examination: Secondary | ICD-10-CM

## 2023-05-08 DIAGNOSIS — E871 Hypo-osmolality and hyponatremia: Secondary | ICD-10-CM | POA: Insufficient documentation

## 2023-05-08 LAB — CBC
HCT: 36.5 % — ABNORMAL LOW (ref 39.0–52.0)
Hemoglobin: 12.5 g/dL — ABNORMAL LOW (ref 13.0–17.0)
MCH: 32.3 pg (ref 26.0–34.0)
MCHC: 34.2 g/dL (ref 30.0–36.0)
MCV: 94.3 fL (ref 80.0–100.0)
Platelets: 213 10*3/uL (ref 150–400)
RBC: 3.87 MIL/uL — ABNORMAL LOW (ref 4.22–5.81)
RDW: 12.7 % (ref 11.5–15.5)
WBC: 16.4 10*3/uL — ABNORMAL HIGH (ref 4.0–10.5)
nRBC: 0 % (ref 0.0–0.2)

## 2023-05-08 LAB — BASIC METABOLIC PANEL
Anion gap: 7 (ref 5–15)
BUN: 14 mg/dL (ref 8–23)
CO2: 27 mmol/L (ref 22–32)
Calcium: 9 mg/dL (ref 8.9–10.3)
Chloride: 99 mmol/L (ref 98–111)
Creatinine, Ser: 0.94 mg/dL (ref 0.61–1.24)
GFR, Estimated: >60 mL/min (ref >60)
Glucose, Bld: 110 mg/dL — ABNORMAL HIGH (ref 70–99)
Potassium: 5 mmol/L (ref 3.5–5.1)
Sodium: 133 mmol/L — ABNORMAL LOW (ref 135–145)

## 2023-05-08 LAB — SURGICAL PCR SCREEN
MRSA, PCR: NEGATIVE
Staphylococcus aureus: NEGATIVE

## 2023-05-08 LAB — TYPE AND SCREEN
ABO/RH(D): O POS
Antibody Screen: NEGATIVE

## 2023-05-08 NOTE — Progress Notes (Signed)
PCP - Dr. Willow Ora Cardiologist -denies Pulm: Dr. Dione Booze: Dr. Jackqulyn Livings Kidney ONC: Dr. Myna Hidalgo   PPM/ICD - n/a  Chest x-ray - 11/27/22 EKG - 11/27/22 Stress Test - denies ECHO - denies Cardiac Cath - denies  Sleep Study - denies CPAP - denies  Last dose of GLP1 agonist-  n/a GLP1 instructions: n/a  Blood Thinner Instructions: n/a Aspirin Instructions: n/a  ERAS Protcol -Clear liquids until 0430 DOS PRE-SURGERY Ensure or G2- Ensure provided, complete by 0430 DOS.  COVID TEST- n/a  Anesthesia review: Spoke with Fayrene Fearing, [place] regarding previous labs from cancer center. Labs redrawn today to recheck NA and WBC count. Was ordered to only collect a CBC and BMP. Will send chart along to f/u on labs. Pt has a history of SIADH.   Patient denies shortness of breath, fever, cough and chest pain at PAT appointment   All instructions explained to the patient, with a verbal understanding of the material. Patient agrees to go over the instructions while at home for a better understanding. Patient also instructed to self quarantine after being tested for COVID-19. The opportunity to ask questions was provided.

## 2023-05-08 NOTE — Telephone Encounter (Signed)
Requesting: Ambien 10mg   Contract:02/21/23 UDS: 02/21/23 Last Visit: 02/21/23 Next Visit: 02/27/24 Last Refill: 11/08/22 #90 and 1rf  Please Advise

## 2023-05-08 NOTE — Progress Notes (Signed)
Anesthesia Chart Review:  72 year old male with pertinent history of hypertension, SIADH with chronic hyponatremia, chronic mild leukocytosis.  Patient follows with oncologist Dr. 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  Last seen 04/25/2023 and upcoming surgery was discussed at that time.  Per note, "I really do not see a problem with him having surgery. I do not think that he should have any problems healing from the surgery. His immune system should be okay."  He has been followed by pulmonologist Dr. Delton Coombes for history of abnormal CT scan.  Last seen 04/20/2023.  Per note, "Very subtle bronchiectatic change, micronodular disease and tree-in-bud pattern. Has a waxing and waning component. No significant progression. Suspect he may be colonized with Mycobacterium although his bronchoscopy did not grow this out. For now I am comfortable following him clinically and with repeat CT in 6 months."  He also stated that patient is at low risk from pulmonary standpoint to proceed with lumbar surgery.  SIADH and chronic hyponatremia are followed by nephrologist Dr. Allena Katz.  Preop labs reviewed, stable chronic hyponatremia sodium 133, stable mild leukocytosis with WBC 16.4, mild anemia with hemoglobin 12.5, otherwise unremarkable.  EKG 11/27/2022: Sinus bradycardia with first-degree AV block.  Rate 54.  CT chest 04/04/2023: IMPRESSION: 1. Patchy bilateral mild-to-moderate cylindrical bronchiectasis with associated patchy tree-in-bud opacities, smooth bronchial wall thickening and scattered centrilobular nodularity, most prominent in the posterior right upper lobe and posterior lower lobes. Mild waxing and waning interval changes as detailed, overall mildly worsened. 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 2. Three-vessel coronary atherosclerosis. 3. Small hiatal hernia. 4.  Moderate left colonic diverticulosis. 5.  Aortic Atherosclerosis (ICD10-I70.0).    [facility] Short Stay Center/Anesthesiology Phone [phone] 05/08/2023 1:59 PM

## 2023-05-08 NOTE — Progress Notes (Signed)
Anesthesia Chart Review:  72 year old male with pertinent history of hypertension, SIADH with chronic hyponatremia, chronic mild leukocytosis.  Patient follows with oncologist Dr. 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  Last seen 04/25/2023 and upcoming surgery was discussed at that time.  Per note, "I really do not see a problem with him having surgery. I do not think that he should have any problems healing from the surgery. His immune system should be okay."  He has been followed by pulmonologist Dr. Delton Coombes for history of abnormal CT scan.  Last seen 04/20/2023.  Per note, "Very subtle bronchiectatic change, micronodular disease and tree-in-bud pattern. Has a waxing and waning component. No significant progression. Suspect he may be colonized with Mycobacterium although his bronchoscopy did not grow this out. For now I am comfortable following him clinically and with repeat CT in 6 months."  He also stated that patient is at low risk from pulmonary standpoint to proceed with lumbar surgery.  SIADH and chronic hyponatremia are followed by nephrologist Dr. Allena Katz.  Preop labs reviewed, stable chronic hyponatremia sodium 133, stable mild leukocytosis with WBC 16.4, mild anemia with hemoglobin 12.5, otherwise unremarkable.  EKG 11/27/2022: Sinus bradycardia with first-degree AV block.  Rate 54.  CT chest 04/04/2023: IMPRESSION: 1. Patchy bilateral mild-to-moderate cylindrical bronchiectasis with associated patchy tree-in-bud opacities, smooth bronchial wall thickening and scattered centrilobular nodularity, most prominent in the posterior right upper lobe and posterior lower lobes. Mild waxing and waning interval changes as detailed, overall mildly worsened. 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 2. Three-vessel coronary atherosclerosis. 3. Small hiatal hernia. 4.  Moderate left colonic diverticulosis. 5.  Aortic Atherosclerosis (ICD10-I70.0).   [facility] Short Stay Center/Anesthesiology Phone [phone] 05/08/2023 1:57 PM

## 2023-05-08 NOTE — Telephone Encounter (Signed)
PDMP okay, Rx sent 

## 2023-05-08 NOTE — Anesthesia Preprocedure Evaluation (Addendum)
Anesthesia Evaluation  Patient identified by MRN, date of birth, ID band Patient awake    Reviewed: Allergy & Precautions, NPO status , Patient's Chart, lab work & pertinent test results  Airway Mallampati: II  TM Distance: >3 FB Neck ROM: Full    Dental  (+) Dental Advisory Given   Pulmonary neg pulmonary ROS   breath sounds clear to auscultation       Cardiovascular hypertension, Pt. on medications and Pt. on home beta blockers  Rhythm:Regular Rate:Normal     Neuro/Psych negative neurological ROS     GI/Hepatic negative GI ROS, Neg liver ROS,,,  Endo/Other  SIADH  Renal/GU negative Renal ROS     Musculoskeletal   Abdominal   Peds  Hematology negative hematology ROS (+)   Anesthesia Other Findings   Reproductive/Obstetrics                             Anesthesia Physical Anesthesia Plan  ASA: 2  Anesthesia Plan: General   Post-op Pain Management: Tylenol PO (pre-op)* and Ketamine IV*   Induction: Intravenous  PONV Risk Score and Plan: 2 and Dexamethasone, Ondansetron and Treatment may vary due to age or medical condition  Airway Management Planned: Oral ETT  Additional Equipment: None  Intra-op Plan:   Post-operative Plan: Extubation in OR  Informed Consent: I have reviewed the patients History and Physical, chart, labs and discussed the procedure including the risks, benefits and alternatives for the proposed anesthesia with the patient or authorized representative who has indicated his/her understanding and acceptance.     Dental advisory given  Plan Discussed with: CRNA  Anesthesia Plan Comments: (PAT note by Antionette Poles, PA-C: 72 year old male with pertinent history of hypertension, SIADH with chronic hyponatremia, chronic mild leukocytosis.  Patient follows with oncologist Dr. Myna Hidalgo for history of monoclonal B-cell lymphocytosis and possible marginal zone  lymphoma.  Last seen 04/25/2023 and upcoming surgery was discussed at that time.  Per note, "I really do not see a problem with him having surgery. I do not think that he should have any problems healing from the surgery. His immune system should be okay."  He has been followed by pulmonologist Dr. Delton Coombes for history of abnormal CT scan.  Last seen 04/20/2023.  Per note, "Very subtle bronchiectatic change, micronodular disease and tree-in-bud pattern. Has a waxing and waning component. No significant progression. Suspect he may be colonized with Mycobacterium although his bronchoscopy did not grow this out. For now I am comfortable following him clinically and with repeat CT in 6 months."  He also stated that patient is at low risk from pulmonary standpoint to proceed with lumbar surgery.  SIADH and chronic hyponatremia are followed by nephrologist Dr. Allena Katz.  Preop labs reviewed, stable chronic hyponatremia sodium 133, stable mild leukocytosis with WBC 16.4, mild anemia with hemoglobin 12.5, otherwise unremarkable.  EKG 11/27/2022: Sinus bradycardia with first-degree AV block.  Rate 54.  CT chest 04/04/2023: IMPRESSION: 1. Patchy bilateral mild-to-moderate cylindrical bronchiectasis with associated patchy tree-in-bud opacities, smooth bronchial wall thickening and scattered centrilobular nodularity, most prominent in the posterior right upper lobe and posterior lower lobes. Mild waxing and waning interval changes as detailed, overall mildly worsened. 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 2. Three-vessel coronary atherosclerosis. 3. Small hiatal hernia. 4. Moderate left colonic diverticulosis. 5.  Aortic Atherosclerosis (ICD10-I70.0).  )        Anesthesia Quick  Evaluation

## 2023-05-09 ENCOUNTER — Ambulatory Visit: Payer: Medicare HMO

## 2023-05-09 DIAGNOSIS — M48062 Spinal stenosis, lumbar region with neurogenic claudication: Secondary | ICD-10-CM | POA: Diagnosis not present

## 2023-05-09 DIAGNOSIS — M5459 Other low back pain: Secondary | ICD-10-CM

## 2023-05-09 DIAGNOSIS — R2689 Other abnormalities of gait and mobility: Secondary | ICD-10-CM

## 2023-05-09 NOTE — Therapy (Signed)
OUTPATIENT PHYSICAL THERAPY TREATMENT     Patient Name: Bobby [name] MRN: [id number] DOB:Nov 11, 1951, 72 y.o., male Today's Date: 05/09/2023  END OF SESSION:  PT End of Session - 05/09/23 1445     Visit Number 13    Date for PT Re-Evaluation 06/19/23    Authorization Type [employer]    PT Start Time 1404    PT Stop Time 1445    PT Time Calculation (min) 41 min    Activity Tolerance Patient tolerated treatment well    Behavior During Therapy [facility] for tasks assessed/performed                    Past Medical History:  Diagnosis Date   Anxiety and depression    Hypertension    Hyponatremia 03/2013   ongoing, seeing Dr. Allena Katz   Insomnia    Melanoma in situ of back [facility])    biopsy on 03/05/2020   SIADH (syndrome of inappropriate ADH production) (HCC) 2017   Dr. Allena Katz   Squamous cell carcinoma in situ    Past Surgical History:  Procedure Laterality Date   BRONCHIAL BIOPSY  11/27/2022   Procedure: BRONCHIAL BIOPSIES;  Surgeon: Leslye Peer, MD;  Location: [facility] ENDOSCOPY;  Service: Cardiopulmonary;;   BRONCHIAL BRUSHINGS  11/27/2022   Procedure: BRONCHIAL BRUSHINGS;  Surgeon: Leslye Peer, MD;  Location: [facility] ENDOSCOPY;  Service: Cardiopulmonary;;   BRONCHIAL WASHINGS  11/27/2022   Procedure: BRONCHIAL WASHINGS;  Surgeon: Leslye Peer, MD;  Location: [facility] ENDOSCOPY;  Service: Cardiopulmonary;;   MOHS SURGERY  ~ 04/2020   face, [facility]   SHOULDER SURGERY  1980s   right shoulder   VIDEO BRONCHOSCOPY N/A 11/27/2022   Procedure: VIDEO BRONCHOSCOPY WITH FLUORO;  Surgeon: Leslye Peer, MD;  Location: [facility] ENDOSCOPY;  Service: Cardiopulmonary;  Laterality: N/A;   Patient Active Problem List   Diagnosis Date Noted   CLL (chronic lymphocytic leukemia) (HCC) 02/21/2023   Abnormal CT of the chest 11/01/2022   Melanoma in situ of back (HCC) 03/16/2020   Squamous cell carcinoma in situ 08/21/2019   Vitamin D deficiency 05/29/2019   Cough 01/01/2016   PCP NOTES >>>>  09/28/2015   Hypogonadism in male 05/13/2015   SIADH (syndrome of inappropriate ADH production) (HCC) 04/13/2013   Memory problem 04/13/2013   Annual physical exam 03/06/2012   THORACOLUMBAR SCOLIOSIS, MILD 08/18/2010   BACK PAIN, LUMBAR 07/21/2010   Anxiety state 04/01/2009   Essential hypertension, benign 01/04/2009   INSOMNIA-SLEEP DISORDER-UNSPEC 09/08/2008    PCP: Wanda Plump, MD   REFERRING PROVIDER: Estill Bamberg, MD   REFERRING DIAG: M54.50 (ICD-10-CM) - Low back pain, unspecified   THERAPY DIAG:  Other low back pain  Other abnormalities of gait and mobility  Spinal stenosis of lumbar region with neurogenic claudication  RATIONALE FOR EVALUATION AND TREATMENT: Rehabilitation  ONSET DATE: x 3 years - 2021  NEXT MD VISIT: ~6 weeks   SUBJECTIVE:  SUBJECTIVE STATEMENT: Pt reports he is learning how to walk better, after last visit, taking bigger steps and rotating hips more.   PAIN: Are you having pain? No  PERTINENT HISTORY:  H/o of 3 yrs of lower back pain which affects his gait. Had a shot the other day - 1 week ago, which helped for 2 to 3 days. PMH: Chronic LBP, thoracolumbar scoliosis, hypertension, CLL (chronic lymphocytic leukemia), melanoma - back, anxiety, depression, insomnia  PRECAUTIONS: None  WEIGHT BEARING RESTRICTIONS: No  FALLS:  Has patient fallen in last 6 months? No  LIVING ENVIRONMENT: Lives with: lives with their spouse Lives in: House/apartment Stairs: Yes: Internal: 11 steps; can reach both and External: 5 steps; on right going up Has following equipment at home: None  OCCUPATION: works at desk, on computer most of the day   PLOF: Independent  PATIENT GOALS: "get my back strong while I am not hurting from the shot "   OBJECTIVE:  (objective measures completed at initial evaluation unless otherwise dated)  DIAGNOSTIC FINDINGS:  02/07/23 - Lumbar spine MRI: IMPRESSION: At L4-5 there is a broad-based disc bulge. Severe bilateral facet arthropathy and ligamentum flavum infolding. Severe spinal stenosis. No left foraminal stenosis. Severe right foraminal stenosis. At L5-S1 there is a broad-based disc osteophyte complex. Moderate bilateral facet arthropathy with a small right facet effusion. Severe left foraminal stenosis. Mild right foraminal stenosis. At L3-4 there is a broad-based disc bulge. Moderate bilateral facet arthropathy. Mild spinal stenosis. Moderate right and mild left foraminal stenosis. No acute osseous injury of the lumbar spine.  02/07/23 - Thoracic spine MRI: IMPRESSION: At T1-2 there is a minimal broad-based disc osteophyte complex.  Moderate-severe bilateral foraminal stenosis. No acute osseous injury of the thoracic spine. Partially visualized cervical spine spondylosis. If there is further clinical concern, recommend a dedicated MRI of the cervical spine.  PATIENT SURVEYS:  Modified Oswestry 20/50 ; 04/30/23: 7/50 = 14.0%  SCREENING FOR RED FLAGS: Bowel or bladder incontinence: No Spinal tumors: No Cauda equina syndrome: No Compression fracture: No Abdominal aneurysm: No  COGNITION:  Overall cognitive status: Within functional limits for tasks assessed    SENSATION: WFL  MUSCLE LENGTH: Hamstrings: Right 35 deg; Left 35 deg Thomas test: Right WNL deg; Left WNL deg  POSTURE:  decreased lumbar lordosis and R lateral pelvic shift , very straight lumbar region L 1 to S1.  Minimal/can't muscle mass B gluteals  PALPATION: Non tender spinous processes lumbar region, paraspinals, quadratus   LUMBAR ROM:   Active  eval  Flexion 75  Extension 45  Right lateral flexion 35  Left lateral flexion 35  Right rotation   Left rotation   (Blank rows = not tested)  LOWER EXTREMITY ROM:    all  hip, knee, ankle AROM and PROM wnl B   LOWER EXTREMITY MMT:    MMT Right eval Left eval Right 04/30/23 Left 04/30/23  Hip flexion 4 4 5 5   Hip extension 4- 4- 4 4+  Hip abduction 3+ 3+ 4- 4-  Hip adduction   4 4+  Hip internal rotation   5 5  Hip external rotation   4 4  Knee flexion   5 5  Knee extension 4+ 4+ 5 5  Ankle dorsiflexion 4 4 4+ 4+  Ankle plantarflexion 4 4 5 5   Ankle inversion      Ankle eversion       (Blank rows = not tested)  LUMBAR SPECIAL TESTS:  Straight leg raise test: Negative  FUNCTIONAL TESTS:  30 seconds chair stand test 15 reps 6 min walk test  GAIT: Distance walked: 1200' Assistive device utilized: None Level of assistance: SBA Comments: gait with pronounced B decreased step length, frequent ataxia L LE, tends to plant B forefeet, no heel strike B  FUNCTIONAL TESTS: (03/30/23) 5 times sit to stand: 10.31 sec Timed up and go (TUG): 9.66 sec 10 meter walk test: 8.75 sec Functional gait assessment: 20/30; 19-24 = medium risk fall    TODAY'S TREATMENT:  05/09/23 THERAPEUTIC EXERCISE: to improve flexibility, strength and mobility.  Demonstration, verbal and tactile cues throughout for technique.  Rec Bike L3x58min 22/30 FGA Shoulder ext 18lb 2x10 Pallof press 13lb x 20 each direction Bird dog 2x10 both sides  05/07/23 THERAPEUTIC EXERCISE: to improve flexibility, strength and mobility.  Demonstration, verbal and tactile cues throughout for technique.  Rec Bike L3x61min Standing hip abduction x 10 3# bil Standing hip extension x 10 3# bil Sidestep 3# weights  Hip hikes 2x10 bil Pallof press 10# x 20; 12# x 20  Alt LE extension with hand touches with yellow weight ball  05/02/23 THERAPEUTIC EXERCISE: to improve flexibility, strength and mobility.  Demonstration, verbal and tactile cues throughout for technique.  UBE L2.0 x 8 min Supine LTR x 10 bil Supine ER/IR x 5 bil Supine heel slide x 10  Standing hip extension 2# x 20 reps  Standing  hip abduction 2# x 20 reps Single RDL bil x 10  Wall slides with staggered stance for trunk and hip extension x 5 04/30/23 THERAPEUTIC EXERCISE: to improve flexibility, strength and mobility.  Demonstration, verbal and tactile cues throughout for technique.  Rec Bike - L4 x 7 min Bridge + blue TB hip ABD isometric 10 x 5" Bridge + blue TB clam 10 x 5"; cues to maintain bridge elevation with abduction, not allowing hips to drop Standing hip ABD with looped blue TB at lower thighs x 10;  cues to maintain upright posture and avoid substitution with side bending at torso  THERAPEUTIC ACTIVITIES: LE MMT 30 sec STS = 16 reps Modified Oswestry: 7 / 50 = 14.0 % Goal assessment   04/26/23 Therapeutic Exercise: to improve strength and mobility.  Demo, verbal and tactile cues throughout for technique.  Bike L4x39min FGA: 20/30 Supine unilateral pedals with trA x 10 R/L Supine isometric dead bug with orange pball 2x5 with 5 second hold Bird dog x 10 bil Pallof press x 10 each direction Demo of standing HS curls with green TB   04/23/23 Therapeutic Exercise: to improve strength and mobility.  Demo, verbal and tactile cues throughout for technique.  Bike L4x38min Knee flexion 35# 2x10  Knee extension 30# 2x10  Supine bridge 2x10 with ball squeeze Alt knee lift and lower x 10 with TrA brace  TrA with pedals x 5  Standing shoulder extension green TB 2x10   PATIENT EDUCATION:  Education details: HEP progression- hip extension and abduction; educated that he could use cuff weights at home but to don/doff when sitting in front of counter Person educated: Patient Education method: Explanation, Demonstration, Verbal cues, Handouts, and MedBridgeGO access code updated Education comprehension: verbalized understanding, returned demonstration, verbal cues required, and needs further education  HOME EXERCISE PROGRAM:\ Access Code: [email or web address] URL: [email or web address] Date:  04/30/2023 Prepared by: Glenetta Hew  Exercises - Hooklying Hamstring Stretch with Strap  - 1-2 x daily - 7 x weekly - 3 reps - 30 sec hold - Supine Posterior Pelvic Tilt  - 1-2  x daily - 7 x weekly - 2 sets - 10 reps - 5 sec hold - Hooklying Single Leg Bent Knee Fallouts with Resistance  - 1-2 x daily - 7 x weekly - 2 sets - 10 reps - 3 sec hold - Supine Bridge with Resistance Band  - 1-2 x daily - 7 x weekly - 2 sets - 10 reps - 5 sec hold - Hooklying Sequential Leg March and Lower  - 1-2 x daily - 7 x weekly - 2 sets - 10 reps - 3 sec hold - Bird Dog Progression  - 1 x daily - 7 x weekly - 3 sets - 10 reps - Standing Hamstring Curl with Resistance  - 1 x daily - 7 x weekly - 3 sets - 10 reps  - Bridge with Hip Abduction and Resistance  - 1 x daily - 7 x weekly - 2 sets - 10 reps - 5 sec hold - Standing Hip Abduction with Resistance at Thighs  - 1 x daily - 7 x weekly - 2 sets - 10 reps - 3 sec hold   ASSESSMENT:  CLINICAL IMPRESSION: Continued to progress exercises to engage core and stabilizing the muscular of the spine. FGA score improved by two points today. Reviewed HEP and provided exercises post op (supine exercises from HEP). Pt has one more visit next week before lumbar fusion on 6/27, will continue to prepare for operation as able.  OBJECTIVE IMPAIRMENTS: Abnormal gait, decreased activity tolerance, decreased balance, decreased knowledge of condition, decreased mobility, decreased strength, impaired flexibility, improper body mechanics, postural dysfunction, and pain.   ACTIVITY LIMITATIONS: carrying, lifting, bending, squatting, stairs, and locomotion level  PARTICIPATION LIMITATIONS: cleaning, laundry, shopping, community activity, and yard work  PERSONAL FACTORS: Age, Fitness, Time since onset of injury/illness/exacerbation, and 1-2 comorbidities: chronic lymphocytic leukemia, anxiety  are also affecting patient's functional outcome.   REHAB POTENTIAL: Good  CLINICAL  DECISION MAKING: Stable/uncomplicated  EVALUATION COMPLEXITY: Low   GOALS: Goals reviewed with patient? Yes  SHORT TERM GOALS: Target date: 2 weeks, 04/10/23   I HEP for  Baseline:  Goal status: MET  04/11/23 - Only completing HEP once a day 2x/wk but denies any concerns.  LONG TERM GOALS: Target date: 06/19/23   FGA 27/30   Baseline: 20/30 Goal status: IN PROGRESS  05/09/23 22/30  2.  30 sec sit to stand 17 reps .  Baseline: 15 reps Goal status: IN PROGRESS  04/30/23 - 16 reps   3.  Increase strength B hips, quads, plantar flexors to 4+/5  Baseline: 3+ to 4+/5 Goal status: PARTIALLY MET  04/30/23 - Overall strength improved with quads and PF now 5/5, but still with moderate hip weakness  4.  Modified oswestry decrease to 10/50  Baseline: 20/50 Goal status: MET  04/30/23 -  7 / 50 = 14.0 %   PLAN:  PT FREQUENCY: 2x/week  PT DURATION: 8 weeks  PLANNED INTERVENTIONS: Therapeutic exercises, Therapeutic activity, Neuromuscular re-education, Balance training, Gait training, Patient/Family education, Self Care, and Joint mobilization  PLAN FOR NEXT SESSION: strengthening for hips (esp hip abduction, extension and ER) and core/lumbopelvic musculature; prepare for back operation on 6/27 - education on BLT precautions   Darleene Cleaver, PTA 05/09/2023, 4:29 PM

## 2023-05-14 ENCOUNTER — Ambulatory Visit: Payer: Medicare HMO | Admitting: Physical Therapy

## 2023-05-14 DIAGNOSIS — M5459 Other low back pain: Secondary | ICD-10-CM

## 2023-05-14 DIAGNOSIS — R2689 Other abnormalities of gait and mobility: Secondary | ICD-10-CM | POA: Diagnosis not present

## 2023-05-14 DIAGNOSIS — M48062 Spinal stenosis, lumbar region with neurogenic claudication: Secondary | ICD-10-CM

## 2023-05-14 NOTE — Therapy (Signed)
OUTPATIENT PHYSICAL THERAPY TREATMENT / DISCHARGE SUMMARY     Patient Name: Bobby [name] MRN: [id number] DOB:1950-11-29, 72 y.o., male Today's Date: 05/14/2023  END OF SESSION:  PT End of Session - 05/14/23 0846     Visit Number 14    Date for PT Re-Evaluation 06/19/23    Authorization Type [employer]    PT Start Time [phone]    PT Stop Time 0928    PT Time Calculation (min) 42 min    Activity Tolerance Patient tolerated treatment well    Behavior During Therapy [facility] for tasks assessed/performed                     Past Medical History:  Diagnosis Date   Anxiety and depression    Hypertension    Hyponatremia 03/2013   ongoing, seeing Dr. Allena Katz   Insomnia    Melanoma in situ of back [facility])    biopsy on 03/05/2020   SIADH (syndrome of inappropriate ADH production) (HCC) 2017   Dr. Allena Katz   Squamous cell carcinoma in situ    Past Surgical History:  Procedure Laterality Date   BRONCHIAL BIOPSY  11/27/2022   Procedure: BRONCHIAL BIOPSIES;  Surgeon: Leslye Peer, MD;  Location: [facility] ENDOSCOPY;  Service: Cardiopulmonary;;   BRONCHIAL BRUSHINGS  11/27/2022   Procedure: BRONCHIAL BRUSHINGS;  Surgeon: Leslye Peer, MD;  Location: [facility] ENDOSCOPY;  Service: Cardiopulmonary;;   BRONCHIAL WASHINGS  11/27/2022   Procedure: BRONCHIAL WASHINGS;  Surgeon: Leslye Peer, MD;  Location: [facility] ENDOSCOPY;  Service: Cardiopulmonary;;   MOHS SURGERY  ~ 04/2020   face, [facility]   SHOULDER SURGERY  1980s   right shoulder   VIDEO BRONCHOSCOPY N/A 11/27/2022   Procedure: VIDEO BRONCHOSCOPY WITH FLUORO;  Surgeon: Leslye Peer, MD;  Location: [facility] ENDOSCOPY;  Service: Cardiopulmonary;  Laterality: N/A;   Patient Active Problem List   Diagnosis Date Noted   CLL (chronic lymphocytic leukemia) (HCC) 02/21/2023   Abnormal CT of the chest 11/01/2022   Melanoma in situ of back (HCC) 03/16/2020   Squamous cell carcinoma in situ 08/21/2019   Vitamin D deficiency 05/29/2019   Cough 01/01/2016    PCP NOTES >>>> 09/28/2015   Hypogonadism in male 05/13/2015   SIADH (syndrome of inappropriate ADH production) (HCC) 04/13/2013   Memory problem 04/13/2013   Annual physical exam 03/06/2012   THORACOLUMBAR SCOLIOSIS, MILD 08/18/2010   BACK PAIN, LUMBAR 07/21/2010   Anxiety state 04/01/2009   Essential hypertension, benign 01/04/2009   INSOMNIA-SLEEP DISORDER-UNSPEC 09/08/2008    PCP: Wanda Plump, MD   REFERRING PROVIDER: Estill Bamberg, MD   REFERRING DIAG: M54.50 (ICD-10-CM) - Low back pain, unspecified   THERAPY DIAG:  Other low back pain  Other abnormalities of gait and mobility  Spinal stenosis of lumbar region with neurogenic claudication  RATIONALE FOR EVALUATION AND TREATMENT: Rehabilitation  ONSET DATE: x 3 years - 2021  NEXT MD VISIT: ~6 weeks   SUBJECTIVE:  SUBJECTIVE STATEMENT: Pt reports he has been walking more and has been sleeping good. Back is a little more sore today, but unsure of trigger.  He is scheduled for LEFT-SIDED LUMBAR 4- LUMBAR 5 TRANSFORAMINAL LUMBAR INTERBODY FUSION AND DECOMPRESSION WITH INSTRUMENTATION AND ALLOGRAFT on Thursday 05/17/23.  PAIN: Are you having pain? Yes: NPRS scale: 5/10 Pain location: Low back Pain description: aching Aggravating factors: unsure Relieving factors: Tylenol  PERTINENT HISTORY:  H/o of 3 yrs of lower back pain which affects his gait. Had a shot the other day - 1 week ago, which helped for 2 to 3 days. PMH: Chronic LBP, thoracolumbar scoliosis, hypertension, CLL (chronic lymphocytic leukemia), melanoma - back, anxiety, depression, insomnia  PRECAUTIONS: None  WEIGHT BEARING RESTRICTIONS: No  FALLS:  Has patient fallen in last 6 months? No  LIVING ENVIRONMENT: Lives with: lives with their spouse Lives in:  House/apartment Stairs: Yes: Internal: 11 steps; can reach both and External: 5 steps; on right going up Has following equipment at home: None  OCCUPATION: works at desk, on computer most of the day   PLOF: Independent  PATIENT GOALS: "get my back strong while I am not hurting from the shot "   OBJECTIVE: (objective measures completed at initial evaluation unless otherwise dated)  DIAGNOSTIC FINDINGS:  02/07/23 - Lumbar spine MRI: IMPRESSION: At L4-5 there is a broad-based disc bulge. Severe bilateral facet arthropathy and ligamentum flavum infolding. Severe spinal stenosis. No left foraminal stenosis. Severe right foraminal stenosis. At L5-S1 there is a broad-based disc osteophyte complex. Moderate bilateral facet arthropathy with a small right facet effusion. Severe left foraminal stenosis. Mild right foraminal stenosis. At L3-4 there is a broad-based disc bulge. Moderate bilateral facet arthropathy. Mild spinal stenosis. Moderate right and mild left foraminal stenosis. No acute osseous injury of the lumbar spine.  02/07/23 - Thoracic spine MRI: IMPRESSION: At T1-2 there is a minimal broad-based disc osteophyte complex.  Moderate-severe bilateral foraminal stenosis. No acute osseous injury of the thoracic spine. Partially visualized cervical spine spondylosis. If there is further clinical concern, recommend a dedicated MRI of the cervical spine.  PATIENT SURVEYS:  Modified Oswestry 20/50 ; 04/30/23: 7/50 = 14.0%  SCREENING FOR RED FLAGS: Bowel or bladder incontinence: No Spinal tumors: No Cauda equina syndrome: No Compression fracture: No Abdominal aneurysm: No  COGNITION:  Overall cognitive status: Within functional limits for tasks assessed    SENSATION: WFL  MUSCLE LENGTH: Hamstrings: Right 35 deg; Left 35 deg Thomas test: Right WNL deg; Left WNL deg  POSTURE:  decreased lumbar lordosis and R lateral pelvic shift , very straight lumbar region L 1 to S1.   Minimal/can't muscle mass B gluteals  PALPATION: Non tender spinous processes lumbar region, paraspinals, quadratus   LUMBAR ROM:   Active  eval  Flexion 75  Extension 45  Right lateral flexion 35  Left lateral flexion 35  Right rotation   Left rotation   (Blank rows = not tested)  LOWER EXTREMITY ROM:    all hip, knee, ankle AROM and PROM wnl B   LOWER EXTREMITY MMT:    MMT Right eval Left eval Right 04/30/23 Left 04/30/23 Right 05/14/23 Left 05/14/23  Hip flexion 4 4 5 5 5 5   Hip extension 4- 4- 4 4+ 4+ 4+  Hip abduction 3+ 3+ 4- 4- 4- 4-  Hip adduction   4 4+ 4+ 4+  Hip internal rotation   5 5 5 5   Hip external rotation   4 4 4+ 5  Knee flexion   5 5 5 5   Knee extension 4+ 4+ 5 5 5 5   Ankle dorsiflexion 4 4 4+ 4+ 5 5  Ankle plantarflexion 4 4 5 5 5 5   Ankle inversion        Ankle eversion         (Blank rows = not tested)  LUMBAR SPECIAL TESTS:  Straight leg raise test: Negative  FUNCTIONAL TESTS:  30 seconds chair stand test 15 reps 6 min walk test  GAIT: Distance walked: 1200' Assistive device utilized: None Level of assistance: SBA Comments: gait with pronounced B decreased step length, frequent ataxia L LE, tends to plant B forefeet, no heel strike B  FUNCTIONAL TESTS: (03/30/23) 5 times sit to stand: 10.31 sec Timed up and go (TUG): 9.66 sec 10 meter walk test: 8.75 sec Functional gait assessment: 20/30; 19-24 = medium risk fall    TODAY'S TREATMENT:   05/14/23 THERAPEUTIC EXERCISE: to improve flexibility, strength and mobility.  Demonstration, verbal and tactile cues throughout for technique. Rec Bike - L4 x 6 min  THERAPEUTIC ACTIVITIES: 30 sec STS = 17 reps FGA = 25/30 Goal assessment  GAIT TRAINING: To normalize gait pattern. Distance walked: 210 ft Assistive device utilized: None Level of assistance: Complete Independence Gait pattern: decreased hip/knee flexion- Right, decreased hip/knee flexion- Left, shuffling, poor foot clearance-  Right, and poor foot clearance- Left Comments: cues for increased foot clearance with increased hip and knee flexion and heel strike on weight acceptance Stairs: Level of Assistance: Complete Independence and Modified independence Stair Negotiation Technique: Alternating Pattern  with No Rails Single Rail on Left Number of Stairs: 14 x 2  Height of Stairs: 7"  Comments: cues to ensure majority of foot placed on each step on ascent to prevent posterior LOB from heels rocking back off edge of step   05/09/23 THERAPEUTIC EXERCISE: to improve flexibility, strength and mobility.  Demonstration, verbal and tactile cues throughout for technique.  Rec Bike L3x38min 22/30 FGA Shoulder ext 18lb 2x10 Pallof press 13lb x 20 each direction Bird dog 2x10 both sides   05/07/23 THERAPEUTIC EXERCISE: to improve flexibility, strength and mobility.  Demonstration, verbal and tactile cues throughout for technique.  Rec Bike L3x38min Standing hip abduction x 10 3# bil Standing hip extension x 10 3# bil Sidestep 3# weights  Hip hikes 2x10 bil Pallof press 10# x 20; 12# x 20  Alt LE extension with hand touches with yellow weight ball   PATIENT EDUCATION:  Education details: gait safety with improved foot clearance and cautioned pt to follow post-op instructions regarding HEP Person educated: Patient Education method: Explanation Education comprehension: verbalized understanding  HOME EXERCISE PROGRAM: Access Code: [email or web address] URL: [email or web address] Date: 04/30/2023 Prepared by: Glenetta Hew  Exercises - Hooklying Hamstring Stretch with Strap  - 1-2 x daily - 7 x weekly - 3 reps - 30 sec hold - Supine Posterior Pelvic Tilt  - 1-2 x daily - 7 x weekly - 2 sets - 10 reps - 5 sec hold - Hooklying Single Leg Bent Knee Fallouts with Resistance  - 1-2 x daily - 7 x weekly - 2 sets - 10 reps - 3 sec hold - Supine Bridge with Resistance Band  - 1-2 x daily - 7 x weekly - 2 sets - 10 reps -  5 sec hold - Hooklying Sequential Leg March and Lower  - 1-2 x daily - 7 x weekly - 2 sets - 10 reps - 3 sec hold - [employer]  Dog Progression  - 1 x daily - 7 x weekly - 3 sets - 10 reps - Standing Hamstring Curl with Resistance  - 1 x daily - 7 x weekly - 3 sets - 10 reps  - Bridge with Hip Abduction and Resistance  - 1 x daily - 7 x weekly - 2 sets - 10 reps - 5 sec hold - Standing Hip Abduction with Resistance at Thighs  - 1 x daily - 7 x weekly - 2 sets - 10 reps - 3 sec hold   ASSESSMENT:  CLINICAL IMPRESSION: Jernard Reiber" has demonstrated good progress with PT, with modified Oswestry indicating improved functional activity tolerance with decreased disability. Overall LE strength now 4+ to 5/5 with only exception being bilateral hip abduction at 4-/5, allowing for improved functional mobility increased repetitions during 30 second sit to stand.  Balance also improved with FGA increased from 20/30 to 25/30.  All PT goals now met or partially met. [name] is scheduled for L-sided L4-5 TLIF on 05/17/23,  therefore will proceed with discharge from physical therapy for this episode.  OBJECTIVE IMPAIRMENTS: Abnormal gait, decreased activity tolerance, decreased balance, decreased knowledge of condition, decreased mobility, decreased strength, impaired flexibility, improper body mechanics, postural dysfunction, and pain.   ACTIVITY LIMITATIONS: carrying, lifting, bending, squatting, stairs, and locomotion level  PARTICIPATION LIMITATIONS: cleaning, laundry, shopping, community activity, and yard work  PERSONAL FACTORS: Age, Fitness, Time since onset of injury/illness/exacerbation, and 1-2 comorbidities: chronic lymphocytic leukemia, anxiety  are also affecting patient's functional outcome.   REHAB POTENTIAL: Good  CLINICAL DECISION MAKING: Stable/uncomplicated  EVALUATION COMPLEXITY: Low   GOALS: Goals reviewed with patient? Yes  SHORT TERM GOALS: Target date: 2 weeks, 04/10/23   I HEP for   Baseline:  Goal status: MET  04/11/23 - Only completing HEP once a day 2x/wk but denies any concerns.  LONG TERM GOALS: Target date: 06/19/23   FGA 27/30   Baseline: 20/30 Goal status: PARTIALLY MET  05/14/23 - improved to 25/30  2.  30 sec sit to stand 17 reps .  Baseline: 15 reps Goal status: MET  05/14/23 - 17 reps  3.  Increase strength B hips, quads, plantar flexors to 4+/5  Baseline: 3+ to 4+/5 Goal status: PARTIALLY MET  05/14/23 - Overall strength improved to 4+/5 to 5/5 with only exception being B hip ABD 4-/5  4.  Modified oswestry decrease to 10/50  Baseline: 20/50 Goal status: MET  04/30/23 -  7 / 50 = 14.0 %   PLAN:  PT FREQUENCY: 2x/week  PT DURATION: 8 weeks  PLANNED INTERVENTIONS: Therapeutic exercises, Therapeutic activity, Neuromuscular re-education, Balance training, Gait training, Patient/Family education, Self Care, and Joint mobilization  PLAN FOR NEXT SESSION: D/C from PT pending surgery on 05/17/23   PHYSICAL THERAPY DISCHARGE SUMMARY  Visits from Start of Care: 14  Current functional level related to goals / functional outcomes: Refer to above clinical impression and goal assessment.   Remaining deficits: As above.  Patient scheduled for L-sided L4-5 TLIF on 05/17/23.   Education / Equipment: HEP, Hospital doctor education.  Patient agrees to discharge. Patient goals were met or partially met. Patient is being discharged due to  patient scheduled for L-sided L4-5 TLIF on 05/17/23.   Marry Guan, PT 05/14/2023, 12:53 PM

## 2023-05-17 ENCOUNTER — Other Ambulatory Visit: Payer: Self-pay | Admitting: Internal Medicine

## 2023-05-17 ENCOUNTER — Observation Stay (HOSPITAL_COMMUNITY)
Admission: RE | Admit: 2023-05-17 | Discharge: 2023-05-18 | Disposition: A | Discharge status: Home / Self Care | Payer: Medicare HMO | Source: Ambulatory Visit | Attending: Orthopedic Surgery | Admitting: Orthopedic Surgery

## 2023-05-17 ENCOUNTER — Ambulatory Visit (HOSPITAL_BASED_OUTPATIENT_CLINIC_OR_DEPARTMENT_OTHER): Payer: Medicare HMO

## 2023-05-17 ENCOUNTER — Encounter (HOSPITAL_COMMUNITY)
Admission: RE | Disposition: A | Discharge status: Home / Self Care | Payer: Self-pay | Source: Ambulatory Visit | Attending: Orthopedic Surgery

## 2023-05-17 ENCOUNTER — Ambulatory Visit (HOSPITAL_COMMUNITY): Payer: Medicare HMO

## 2023-05-17 ENCOUNTER — Other Ambulatory Visit: Payer: Self-pay

## 2023-05-17 ENCOUNTER — Ambulatory Visit (HOSPITAL_COMMUNITY): Payer: Medicare HMO | Admitting: Physician Assistant

## 2023-05-17 DIAGNOSIS — M4316 Spondylolisthesis, lumbar region: Principal | ICD-10-CM | POA: Insufficient documentation

## 2023-05-17 DIAGNOSIS — I10 Essential (primary) hypertension: Secondary | ICD-10-CM | POA: Diagnosis not present

## 2023-05-17 DIAGNOSIS — Z85828 Personal history of other malignant neoplasm of skin: Secondary | ICD-10-CM | POA: Insufficient documentation

## 2023-05-17 DIAGNOSIS — M5416 Radiculopathy, lumbar region: Secondary | ICD-10-CM

## 2023-05-17 DIAGNOSIS — M48062 Spinal stenosis, lumbar region with neurogenic claudication: Secondary | ICD-10-CM | POA: Diagnosis not present

## 2023-05-17 DIAGNOSIS — Z79899 Other long term (current) drug therapy: Secondary | ICD-10-CM | POA: Insufficient documentation

## 2023-05-17 DIAGNOSIS — Z0189 Encounter for other specified special examinations: Secondary | ICD-10-CM | POA: Diagnosis not present

## 2023-05-17 HISTORY — PX: TRANSFORAMINAL LUMBAR INTERBODY FUSION (TLIF) WITH PEDICLE SCREW FIXATION 1 LEVEL: SHX6141

## 2023-05-17 LAB — ABO/RH: ABO/RH(D): O POS

## 2023-05-17 SURGERY — TRANSFORAMINAL LUMBAR INTERBODY FUSION (TLIF) WITH PEDICLE SCREW FIXATION 1 LEVEL
Anesthesia: General | Laterality: Left

## 2023-05-17 MED ORDER — METHOCARBAMOL 1000 MG/10ML IJ SOLN: 500.0000 mg | Freq: Four times a day (QID) | INTRAVENOUS | Status: DC | PRN

## 2023-05-17 MED ORDER — ACETAMINOPHEN 325 MG PO TABS
650.0000 mg | ORAL_TABLET | ORAL | Status: DC | PRN
  Administered 2023-05-17: 650 mg via ORAL
  Filled 2023-05-17: qty 2

## 2023-05-17 MED ORDER — PHENYLEPHRINE 80 MCG/ML (10ML) SYRINGE FOR IV PUSH (FOR BLOOD PRESSURE SUPPORT)
PREFILLED_SYRINGE | INTRAVENOUS | Status: AC
  Filled 2023-05-17: qty 10

## 2023-05-17 MED ORDER — OXYCODONE-ACETAMINOPHEN 5-325 MG PO TABS: 1.0000 | ORAL_TABLET | ORAL | 0 refills | Status: DC | PRN

## 2023-05-17 MED ORDER — HYDROCODONE-ACETAMINOPHEN 5-325 MG PO TABS
1.0000 | ORAL_TABLET | ORAL | Status: DC | PRN
  Administered 2023-05-17 – 2023-05-18 (×3): 2 via ORAL
  Filled 2023-05-17 (×3): qty 2

## 2023-05-17 MED ORDER — DUPILUMAB 300 MG/2ML ~~LOC~~ SOAJ: SUBCUTANEOUS | Status: DC

## 2023-05-17 MED ORDER — KETAMINE HCL 50 MG/5ML IJ SOSY
PREFILLED_SYRINGE | INTRAMUSCULAR | Status: AC
  Filled 2023-05-17: qty 5

## 2023-05-17 MED ORDER — THROMBIN 20000 UNITS EX SOLR
CUTANEOUS | Status: AC
  Filled 2023-05-17: qty 20000

## 2023-05-17 MED ORDER — VITAMIN D 25 MCG (1000 UNIT) PO TABS
1000.0000 [IU] | ORAL_TABLET | Freq: Every day | ORAL | Status: DC
  Administered 2023-05-17: 1000 [IU] via ORAL
  Filled 2023-05-17: qty 1

## 2023-05-17 MED ORDER — BUPIVACAINE LIPOSOME 1.3 % IJ SUSP
INTRAMUSCULAR | Status: DC | PRN
  Administered 2023-05-17: 20 mL

## 2023-05-17 MED ORDER — PHENYLEPHRINE HCL-NACL 20-0.9 MG/250ML-% IV SOLN
INTRAVENOUS | Status: DC | PRN
  Administered 2023-05-17: 30 ug/min via INTRAVENOUS

## 2023-05-17 MED ORDER — ZOLPIDEM TARTRATE 5 MG PO TABS: 10.0000 mg | ORAL_TABLET | Freq: Every evening | ORAL | Status: DC | PRN

## 2023-05-17 MED ORDER — 0.9 % SODIUM CHLORIDE (POUR BTL) OPTIME
TOPICAL | Status: DC | PRN
  Administered 2023-05-17: 1000 mL

## 2023-05-17 MED ORDER — PROPOFOL 10 MG/ML IV BOLUS
INTRAVENOUS | Status: DC | PRN
  Administered 2023-05-17: 200 mg via INTRAVENOUS

## 2023-05-17 MED ORDER — SUGAMMADEX SODIUM 200 MG/2ML IV SOLN
INTRAVENOUS | Status: DC | PRN
  Administered 2023-05-17: 200 mg via INTRAVENOUS

## 2023-05-17 MED ORDER — SENNOSIDES-DOCUSATE SODIUM 8.6-50 MG PO TABS
1.0000 | ORAL_TABLET | Freq: Every evening | ORAL | Status: DC | PRN
  Administered 2023-05-17: 1 via ORAL
  Filled 2023-05-17: qty 1

## 2023-05-17 MED ORDER — BISACODYL 5 MG PO TBEC: 5.0000 mg | DELAYED_RELEASE_TABLET | Freq: Every day | ORAL | Status: DC | PRN

## 2023-05-17 MED ORDER — ONDANSETRON HCL 4 MG PO TABS: 4.0000 mg | ORAL_TABLET | Freq: Four times a day (QID) | ORAL | Status: DC | PRN

## 2023-05-17 MED ORDER — ACETAMINOPHEN 650 MG RE SUPP: 650.0000 mg | RECTAL | Status: DC | PRN

## 2023-05-17 MED ORDER — ONDANSETRON HCL 4 MG/2ML IJ SOLN: 4.0000 mg | Freq: Four times a day (QID) | INTRAMUSCULAR | Status: DC | PRN

## 2023-05-17 MED ORDER — ONDANSETRON HCL 4 MG/2ML IJ SOLN
INTRAMUSCULAR | Status: DC | PRN
  Administered 2023-05-17: 4 mg via INTRAVENOUS

## 2023-05-17 MED ORDER — CEFAZOLIN SODIUM-DEXTROSE 2-4 GM/100ML-% IV SOLN
2.0000 g | INTRAVENOUS | Status: AC
  Administered 2023-05-17: 2 g via INTRAVENOUS
  Filled 2023-05-17: qty 100

## 2023-05-17 MED ORDER — FENTANYL CITRATE (PF) 250 MCG/5ML IJ SOLN
INTRAMUSCULAR | Status: AC
  Filled 2023-05-17: qty 5

## 2023-05-17 MED ORDER — CARVEDILOL 6.25 MG PO TABS
6.2500 mg | ORAL_TABLET | Freq: Two times a day (BID) | ORAL | Status: DC
  Administered 2023-05-17: 6.25 mg via ORAL
  Filled 2023-05-17: qty 1

## 2023-05-17 MED ORDER — MIDAZOLAM HCL 2 MG/2ML IJ SOLN
INTRAMUSCULAR | Status: DC | PRN
  Administered 2023-05-17: 2 mg via INTRAVENOUS

## 2023-05-17 MED ORDER — LIDOCAINE 2% (20 MG/ML) 5 ML SYRINGE
INTRAMUSCULAR | Status: DC | PRN
  Administered 2023-05-17: 100 mg via INTRAVENOUS

## 2023-05-17 MED ORDER — ORAL CARE MOUTH RINSE: 15.0000 mL | Freq: Once | OROMUCOSAL | Status: AC

## 2023-05-17 MED ORDER — ALUM & MAG HYDROXIDE-SIMETH 200-200-20 MG/5ML PO SUSP: 30.0000 mL | Freq: Four times a day (QID) | ORAL | Status: DC | PRN

## 2023-05-17 MED ORDER — MENTHOL 3 MG MT LOZG: 1.0000 | LOZENGE | OROMUCOSAL | Status: DC | PRN

## 2023-05-17 MED ORDER — OXYCODONE-ACETAMINOPHEN 5-325 MG PO TABS: 1.0000 | ORAL_TABLET | ORAL | Status: DC | PRN

## 2023-05-17 MED ORDER — CARVEDILOL 6.25 MG PO TABS
6.2500 mg | ORAL_TABLET | Freq: Two times a day (BID) | ORAL | Status: DC
  Administered 2023-05-18: 6.25 mg via ORAL
  Filled 2023-05-17: qty 1

## 2023-05-17 MED ORDER — LACTATED RINGERS IV SOLN: INTRAVENOUS | Status: DC

## 2023-05-17 MED ORDER — FENTANYL CITRATE (PF) 100 MCG/2ML IJ SOLN: 25.0000 ug | INTRAMUSCULAR | Status: DC | PRN

## 2023-05-17 MED ORDER — PROPOFOL 10 MG/ML IV BOLUS
INTRAVENOUS | Status: AC
  Filled 2023-05-17: qty 20

## 2023-05-17 MED ORDER — SODIUM CHLORIDE 0.9% FLUSH: 3.0000 mL | INTRAVENOUS | Status: DC | PRN

## 2023-05-17 MED ORDER — BUPIVACAINE-EPINEPHRINE (PF) 0.25% -1:200000 IJ SOLN
INTRAMUSCULAR | Status: AC
  Filled 2023-05-17: qty 30

## 2023-05-17 MED ORDER — BUPIVACAINE LIPOSOME 1.3 % IJ SUSP
INTRAMUSCULAR | Status: AC
  Filled 2023-05-17: qty 20

## 2023-05-17 MED ORDER — ATORVASTATIN CALCIUM 10 MG PO TABS
20.0000 mg | ORAL_TABLET | Freq: Every day | ORAL | Status: DC
  Filled 2023-05-17: qty 2

## 2023-05-17 MED ORDER — LIDOCAINE 2% (20 MG/ML) 5 ML SYRINGE
INTRAMUSCULAR | Status: AC
  Filled 2023-05-17: qty 5

## 2023-05-17 MED ORDER — CEFAZOLIN SODIUM-DEXTROSE 2-4 GM/100ML-% IV SOLN
2.0000 g | Freq: Three times a day (TID) | INTRAVENOUS | Status: AC
  Administered 2023-05-17 (×2): 2 g via INTRAVENOUS
  Filled 2023-05-17 (×2): qty 100

## 2023-05-17 MED ORDER — DOCUSATE SODIUM 100 MG PO CAPS
100.0000 mg | ORAL_CAPSULE | Freq: Two times a day (BID) | ORAL | Status: DC
  Administered 2023-05-17: 100 mg via ORAL
  Filled 2023-05-17: qty 1

## 2023-05-17 MED ORDER — CHLORHEXIDINE GLUCONATE 0.12 % MT SOLN
15.0000 mL | Freq: Once | OROMUCOSAL | Status: AC
  Administered 2023-05-17: 15 mL via OROMUCOSAL
  Filled 2023-05-17: qty 15

## 2023-05-17 MED ORDER — MORPHINE SULFATE (PF) 2 MG/ML IV SOLN: 1.0000 mg | INTRAVENOUS | Status: DC | PRN

## 2023-05-17 MED ORDER — FLEET ENEMA 7-19 GM/118ML RE ENEM: 1.0000 | ENEMA | Freq: Once | RECTAL | Status: DC | PRN

## 2023-05-17 MED ORDER — MIDAZOLAM HCL 2 MG/2ML IJ SOLN
INTRAMUSCULAR | Status: AC
  Filled 2023-05-17: qty 2

## 2023-05-17 MED ORDER — CLONAZEPAM 0.5 MG PO TABS
0.5000 mg | ORAL_TABLET | Freq: Two times a day (BID) | ORAL | Status: DC | PRN
  Filled 2023-05-17: qty 1

## 2023-05-17 MED ORDER — METHOCARBAMOL 500 MG PO TABS
500.0000 mg | ORAL_TABLET | Freq: Four times a day (QID) | ORAL | Status: DC | PRN
  Administered 2023-05-17 (×2): 500 mg via ORAL
  Filled 2023-05-17 (×2): qty 1

## 2023-05-17 MED ORDER — SODIUM CHLORIDE 0.9 % IV SOLN
250.0000 mL | INTRAVENOUS | Status: DC
  Administered 2023-05-17: 250 mL via INTRAVENOUS

## 2023-05-17 MED ORDER — METHOCARBAMOL 500 MG PO TABS: 500.0000 mg | ORAL_TABLET | Freq: Four times a day (QID) | ORAL | 2 refills | Status: DC | PRN

## 2023-05-17 MED ORDER — THROMBIN 20000 UNITS EX SOLR
CUTANEOUS | Status: DC | PRN
  Administered 2023-05-17: 20 mL via TOPICAL

## 2023-05-17 MED ORDER — DEXAMETHASONE SODIUM PHOSPHATE 10 MG/ML IJ SOLN
INTRAMUSCULAR | Status: DC | PRN
  Administered 2023-05-17: 10 mg via INTRAVENOUS

## 2023-05-17 MED ORDER — DEXAMETHASONE SODIUM PHOSPHATE 10 MG/ML IJ SOLN
INTRAMUSCULAR | Status: AC
  Filled 2023-05-17: qty 1

## 2023-05-17 MED ORDER — FENTANYL CITRATE (PF) 250 MCG/5ML IJ SOLN
INTRAMUSCULAR | Status: DC | PRN
  Administered 2023-05-17: 100 ug via INTRAVENOUS
  Administered 2023-05-17: 50 ug via INTRAVENOUS
  Administered 2023-05-17: 100 ug via INTRAVENOUS
  Administered 2023-05-17: 50 ug via INTRAVENOUS
  Administered 2023-05-17: 75 ug via INTRAVENOUS

## 2023-05-17 MED ORDER — POVIDONE-IODINE 7.5 % EX SOLN
Freq: Once | CUTANEOUS | Status: AC
  Filled 2023-05-17: qty 118

## 2023-05-17 MED ORDER — PHENYLEPHRINE 80 MCG/ML (10ML) SYRINGE FOR IV PUSH (FOR BLOOD PRESSURE SUPPORT)
PREFILLED_SYRINGE | INTRAVENOUS | Status: DC | PRN
  Administered 2023-05-17: 80 ug via INTRAVENOUS
  Administered 2023-05-17: 120 ug via INTRAVENOUS
  Administered 2023-05-17: 200 ug via INTRAVENOUS
  Administered 2023-05-17: 80 ug via INTRAVENOUS
  Administered 2023-05-17: 200 ug via INTRAVENOUS

## 2023-05-17 MED ORDER — SUCCINYLCHOLINE CHLORIDE 200 MG/10ML IV SOSY
PREFILLED_SYRINGE | INTRAVENOUS | Status: AC
  Filled 2023-05-17: qty 10

## 2023-05-17 MED ORDER — B COMPLEX VITAMINS PO CAPS: 1.0000 | ORAL_CAPSULE | Freq: Every day | ORAL | Status: DC

## 2023-05-17 MED ORDER — PHENOL 1.4 % MT LIQD: 1.0000 | OROMUCOSAL | Status: DC | PRN

## 2023-05-17 MED ORDER — ROCURONIUM BROMIDE 10 MG/ML (PF) SYRINGE
PREFILLED_SYRINGE | INTRAVENOUS | Status: AC
  Filled 2023-05-17: qty 10

## 2023-05-17 MED ORDER — ADULT MULTIVITAMIN W/MINERALS CH: 1.0000 | ORAL_TABLET | Freq: Every day | ORAL | Status: DC

## 2023-05-17 MED ORDER — ROCURONIUM BROMIDE 10 MG/ML (PF) SYRINGE
PREFILLED_SYRINGE | INTRAVENOUS | Status: DC | PRN
  Administered 2023-05-17: 5 mg via INTRAVENOUS
  Administered 2023-05-17: 10 mg via INTRAVENOUS
  Administered 2023-05-17: 20 mg via INTRAVENOUS
  Administered 2023-05-17: 60 mg via INTRAVENOUS
  Administered 2023-05-17: 5 mg via INTRAVENOUS

## 2023-05-17 MED ORDER — VASOPRESSIN 20 UNIT/ML IV SOLN
INTRAVENOUS | Status: AC
  Filled 2023-05-17: qty 1

## 2023-05-17 MED ORDER — SODIUM CHLORIDE 0.9% FLUSH
3.0000 mL | Freq: Two times a day (BID) | INTRAVENOUS | Status: DC
  Administered 2023-05-17 (×2): 3 mL via INTRAVENOUS

## 2023-05-17 MED ORDER — ZOLPIDEM TARTRATE 5 MG PO TABS: 5.0000 mg | ORAL_TABLET | Freq: Every evening | ORAL | Status: DC | PRN

## 2023-05-17 MED ORDER — ONDANSETRON HCL 4 MG/2ML IJ SOLN
INTRAMUSCULAR | Status: AC
  Filled 2023-05-17: qty 2

## 2023-05-17 MED ORDER — ACETAMINOPHEN 500 MG PO TABS
1000.0000 mg | ORAL_TABLET | Freq: Once | ORAL | Status: AC
  Administered 2023-05-17: 1000 mg via ORAL
  Filled 2023-05-17: qty 2

## 2023-05-17 MED ORDER — POTASSIUM CHLORIDE IN NACL 20-0.9 MEQ/L-% IV SOLN: INTRAVENOUS | Status: DC

## 2023-05-17 MED ORDER — AMISULPRIDE (ANTIEMETIC) 5 MG/2ML IV SOLN: 10.0000 mg | Freq: Once | INTRAVENOUS | Status: DC | PRN

## 2023-05-17 MED ORDER — BUPIVACAINE-EPINEPHRINE 0.25% -1:200000 IJ SOLN
INTRAMUSCULAR | Status: DC | PRN
  Administered 2023-05-17: 7 mL
  Administered 2023-05-17: 20 mL

## 2023-05-17 MED ORDER — KETAMINE HCL 10 MG/ML IJ SOLN
INTRAMUSCULAR | Status: DC | PRN
  Administered 2023-05-17: 30 mg via INTRAVENOUS

## 2023-05-17 MED ORDER — EPHEDRINE 5 MG/ML INJ
INTRAVENOUS | Status: AC
  Filled 2023-05-17: qty 5

## 2023-05-17 MED ORDER — FOLIC ACID 1 MG PO TABS
1.0000 mg | ORAL_TABLET | Freq: Every day | ORAL | Status: DC
  Administered 2023-05-17: 1 mg via ORAL
  Filled 2023-05-17: qty 1

## 2023-05-17 SURGICAL SUPPLY — 90 items
AGENT HMST KT MTR STRL THRMB (HEMOSTASIS)
APL SKNCLS STERI-STRIP NONHPOA (GAUZE/BANDAGES/DRESSINGS) ×1
BAG COUNTER SPONGE SURGICOUNT (BAG) ×1 IMPLANT
BAG SPNG CNTER NS LX DISP (BAG) ×1
BENZOIN TINCTURE PRP APPL 2/3 (GAUZE/BANDAGES/DRESSINGS) ×1 IMPLANT
BLADE CLIPPER SURG (BLADE) IMPLANT
BUR PRESCISION 1.7 ELITE (BURR) ×1 IMPLANT
BUR ROUND FLUTED 5 RND (BURR) ×1 IMPLANT
BUR ROUND PRECISION 4.0 (BURR) IMPLANT
BUR SABER RD CUTTING 3.0 (BURR) IMPLANT
CAGE SABLE 10X26 6-12 8D (Cage) IMPLANT
CANNULA GRAFT BNE VG PRE-FILL (Bone Implant) IMPLANT
CNTNR URN SCR LID CUP LEK RST (MISCELLANEOUS) ×1 IMPLANT
CONT SPEC 4OZ STRL OR WHT (MISCELLANEOUS) ×1
COVER BACK TABLE 60X90IN (DRAPES) ×1 IMPLANT
COVER MAYO STAND STRL (DRAPES) ×2 IMPLANT
COVER SURGICAL LIGHT HANDLE (MISCELLANEOUS) ×1 IMPLANT
DISPENSER GRAFT BNE VG (MISCELLANEOUS) IMPLANT
DISPENSER VIVIGEN BONE GRAFT (MISCELLANEOUS) ×1 IMPLANT
DRAIN CHANNEL 15F RND FF W/TCR (WOUND CARE) IMPLANT
DRAPE C-ARM 42X72 X-RAY (DRAPES) ×1 IMPLANT
DRAPE C-ARMOR (DRAPES) IMPLANT
DRAPE POUCH INSTRU U-SHP 10X18 (DRAPES) ×1 IMPLANT
DRAPE SURG 17X23 STRL (DRAPES) ×4 IMPLANT
DURAPREP 26ML APPLICATOR (WOUND CARE) ×1 IMPLANT
ELECT BLADE 4.0 EZ CLEAN MEGAD (MISCELLANEOUS) ×1
ELECT CAUTERY BLADE 6.4 (BLADE) ×1 IMPLANT
ELECT REM PT RETURN 9FT ADLT (ELECTROSURGICAL) ×1
ELECTRODE BLDE 4.0 EZ CLN MEGD (MISCELLANEOUS) ×1 IMPLANT
ELECTRODE REM PT RTRN 9FT ADLT (ELECTROSURGICAL) ×1 IMPLANT
EVACUATOR SILICONE 100CC (DRAIN) IMPLANT
FILTER STRAW FLUID ASPIR (MISCELLANEOUS) ×1 IMPLANT
GAUZE 4X4 16PLY ~~LOC~~+RFID DBL (SPONGE) ×1 IMPLANT
GAUZE SPONGE 4X4 12PLY STRL (GAUZE/BANDAGES/DRESSINGS) ×1 IMPLANT
GLOVE BIO SURGEON STRL SZ 6.5 (GLOVE) ×1 IMPLANT
GLOVE BIO SURGEON STRL SZ8 (GLOVE) ×1 IMPLANT
GLOVE BIOGEL PI IND STRL 7.0 (GLOVE) ×1 IMPLANT
GLOVE BIOGEL PI IND STRL 8 (GLOVE) ×1 IMPLANT
GLOVE SURG ENC MOIS LTX SZ6.5 (GLOVE) ×1 IMPLANT
GOWN STRL REUS W/ TWL LRG LVL3 (GOWN DISPOSABLE) ×2 IMPLANT
GOWN STRL REUS W/ TWL XL LVL3 (GOWN DISPOSABLE) ×1 IMPLANT
GOWN STRL REUS W/TWL LRG LVL3 (GOWN DISPOSABLE) ×2
GOWN STRL REUS W/TWL XL LVL3 (GOWN DISPOSABLE) ×1
GRAFT BONE CANNULA VIVIGEN 3 (Bone Implant) ×2 IMPLANT
IV CATH 14GX2 1/4 (CATHETERS) ×1 IMPLANT
KIT BASIN OR (CUSTOM PROCEDURE TRAY) ×1 IMPLANT
KIT POSITION SURG JACKSON T1 (MISCELLANEOUS) ×1 IMPLANT
KIT TURNOVER KIT B (KITS) ×1 IMPLANT
MARKER SKIN DUAL TIP RULER LAB (MISCELLANEOUS) ×2 IMPLANT
NDL 18GX1X1/2 (RX/OR ONLY) (NEEDLE) ×1 IMPLANT
NDL 22X1.5 STRL (OR ONLY) (MISCELLANEOUS) ×2 IMPLANT
NDL HYPO 25GX1X1/2 BEV (NEEDLE) ×1 IMPLANT
NDL SPNL 18GX3.5 QUINCKE PK (NEEDLE) ×2 IMPLANT
NEEDLE 18GX1X1/2 (RX/OR ONLY) (NEEDLE) ×1 IMPLANT
NEEDLE 22X1.5 STRL (OR ONLY) (MISCELLANEOUS) ×2 IMPLANT
NEEDLE HYPO 25GX1X1/2 BEV (NEEDLE) ×1 IMPLANT
NEEDLE SPNL 18GX3.5 QUINCKE PK (NEEDLE) ×2 IMPLANT
NS IRRIG 1000ML POUR BTL (IV SOLUTION) ×1 IMPLANT
PACK LAMINECTOMY ORTHO (CUSTOM PROCEDURE TRAY) ×1 IMPLANT
PACK UNIVERSAL I (CUSTOM PROCEDURE TRAY) ×1 IMPLANT
PAD ARMBOARD 7.5X6 YLW CONV (MISCELLANEOUS) ×2 IMPLANT
PATTIES SURGICAL .5 X1 (DISPOSABLE) ×1 IMPLANT
PATTIES SURGICAL .5X1.5 (GAUZE/BANDAGES/DRESSINGS) ×1 IMPLANT
PUTTY DBX 1CC (Putty) ×1 IMPLANT
PUTTY DBX 1CC DEPUY (Putty) IMPLANT
ROD PRE BENT EXP 40MM (Rod) IMPLANT
SCREW SET SINGLE INNER (Screw) IMPLANT
SCREW VIPER CORT FIX 6.00X30 (Screw) IMPLANT
SPONGE INTESTINAL PEANUT (DISPOSABLE) ×1 IMPLANT
SPONGE SURGIFOAM ABS GEL 100 (HEMOSTASIS) ×1 IMPLANT
SPONGE T-LAP 4X18 ~~LOC~~+RFID (SPONGE) IMPLANT
STRIP CLOSURE SKIN 1/2X4 (GAUZE/BANDAGES/DRESSINGS) ×2 IMPLANT
SURGIFLO W/THROMBIN 8M KIT (HEMOSTASIS) IMPLANT
SUT MNCRL AB 4-0 PS2 18 (SUTURE) ×1 IMPLANT
SUT VIC AB 0 CT1 18XCR BRD 8 (SUTURE) ×1 IMPLANT
SUT VIC AB 0 CT1 8-18 (SUTURE)
SUT VIC AB 1 CT1 18XCR BRD 8 (SUTURE) ×1 IMPLANT
SUT VIC AB 1 CT1 8-18 (SUTURE) ×1
SUT VIC AB 2-0 CT2 18 VCP726D (SUTURE) ×1 IMPLANT
SYR 20ML LL LF (SYRINGE) ×2 IMPLANT
SYR BULB IRRIG 60ML STRL (SYRINGE) ×1 IMPLANT
SYR CONTROL 10ML LL (SYRINGE) ×2 IMPLANT
SYR TB 1ML LUER SLIP (SYRINGE) ×1 IMPLANT
TAP EXPEDIUM DL 4.35 (INSTRUMENTS) IMPLANT
TAP EXPEDIUM DL 5.0 (INSTRUMENTS) IMPLANT
TAP EXPEDIUM DL 6.0 (INSTRUMENTS) IMPLANT
TRAY FOLEY MTR SLVR 16FR STAT (SET/KITS/TRAYS/PACK) ×1 IMPLANT
TUBE FUNNEL GL DISP (ORTHOPEDIC DISPOSABLE SUPPLIES) IMPLANT
WATER STERILE IRR 1000ML POUR (IV SOLUTION) ×1 IMPLANT
YANKAUER SUCT BULB TIP NO VENT (SUCTIONS) ×1 IMPLANT

## 2023-05-17 NOTE — H&P (Signed)
PREOPERATIVE H&P  Chief Complaint: Bilateral leg pain  HPI: Bobby [name] is a 72 y.o. male who presents with ongoing pain in the bilateral legs  MRI reveals severe stenosis and instability at L4-5  Patient has failed multiple forms of conservative care and continues to have pain (see office notes for additional details regarding the patient's full course of treatment)  Past Medical History:  Diagnosis Date   Anxiety and depression    Hypertension    Hyponatremia 03/2013   ongoing, seeing Dr. Allena Katz   Insomnia    Melanoma in situ of back [facility])    biopsy on 03/05/2020   SIADH (syndrome of inappropriate ADH production) (HCC) 2017   Dr. Allena Katz   Squamous cell carcinoma in situ    Past Surgical History:  Procedure Laterality Date   BRONCHIAL BIOPSY  11/27/2022   Procedure: BRONCHIAL BIOPSIES;  Surgeon: Leslye Peer, MD;  Location: [facility] ENDOSCOPY;  Service: Cardiopulmonary;;   BRONCHIAL BRUSHINGS  11/27/2022   Procedure: BRONCHIAL BRUSHINGS;  Surgeon: Leslye Peer, MD;  Location: [facility] ENDOSCOPY;  Service: Cardiopulmonary;;   BRONCHIAL WASHINGS  11/27/2022   Procedure: BRONCHIAL WASHINGS;  Surgeon: Leslye Peer, MD;  Location: [facility] ENDOSCOPY;  Service: Cardiopulmonary;;   MOHS SURGERY  ~ 25-May-2020   face, [facility]   SHOULDER SURGERY  1980s   right shoulder   VIDEO BRONCHOSCOPY N/A 11/27/2022   Procedure: VIDEO BRONCHOSCOPY WITH FLUORO;  Surgeon: Leslye Peer, MD;  Location: [facility] ENDOSCOPY;  Service: Cardiopulmonary;  Laterality: N/A;   Social History   Socioeconomic History   Marital status: Married    Spouse name: Not on file   Number of children: 2   Years of education: 16   Highest education level: Bachelor's degree (e.g., BA, AB, BS)  Occupational History   Occupation: semi-retired--business owner   Tobacco Use   Smoking status: Never   Smokeless tobacco: Never  Vaping Use   Vaping Use: Never used  Substance and Sexual Activity   Alcohol use: Yes    Comment: 1-2 qd    Drug use: No   Sexual activity: Yes  Other Topics Concern   Not on file  Social History Narrative   "Bobby [name]", married    2 children   mom passed away May 25, 2008  Right-handed.   2-3 cups coffee per day.   Lives at home with wife.            Social Determinants of Health   Financial Resource Strain: Low Risk  (08/10/2021)   Overall Financial Resource Strain (CARDIA)    Difficulty of Paying Living Expenses: Not hard at all  Food Insecurity: No Food Insecurity (08/10/2021)   Hunger Vital Sign    Worried About Running Out of Food in the Last Year: Never true    Ran Out of Food in the Last Year: Never true  Transportation Needs: No Transportation Needs (08/10/2021)   PRAPARE - Administrator, Civil Service (Medical): No    Lack of Transportation (Non-Medical): No  Physical Activity: Insufficiently Active (08/10/2021)   Exercise Vital Sign    Days of Exercise per Week: 3 days    Minutes of Exercise per Session: 40 min  Stress: No Stress Concern Present (08/10/2021)   [employer] of Occupational Health - Occupational Stress Questionnaire    Feeling of Stress : Only a little  Social Connections: Moderately Isolated (08/10/2021)   Social Connection and Isolation Panel [NHANES]    Frequency  of Communication with Friends and Family: More than three times a week    Frequency of Social Gatherings with Friends and Family: More than three times a week    Attends Religious Services: Never    Database administrator or Organizations: No    Attends Engineer, structural: Never    Marital Status: Married   Family History  Problem Relation Age of Onset   Hypertension Mother    Stroke Mother    Diabetes Mother    Emphysema Mother        smoked   Thyroid disease Mother    Alcoholism Father    Colon cancer Neg Hx    Prostate cancer Neg Hx    CAD Neg Hx    Allergies  Allergen Reactions   Tramadol Hives   Prior to Admission medications   Medication Sig Start  Date End Date Taking? Authorizing Provider  b complex vitamins capsule Take 1 capsule by mouth daily.   Yes [provider]  carvedilol (COREG) 6.25 MG tablet Take 1 tablet (6.25 mg total) by mouth 2 (two) times daily with a meal. 03/29/23  Yes Paz, Nolon Rod, MD  cholecalciferol (VITAMIN D3) 25 MCG (1000 UNIT) tablet Take 1,000 Units by mouth daily.   Yes [provider]  clonazePAM (KLONOPIN) 0.5 MG tablet TAKE ONE TABLET BY MOUTH TWICE A DAY AS NEEDED FOR ANXIETY Patient taking differently: Take 0.25 mg by mouth 2 (two) times daily as needed for anxiety. 03/12/23  Yes Paz, Nolon Rod, MD  DUPIXENT 300 MG/2ML SOPN Inject into the skin every 14 (fourteen) days. 01/12/22  Yes [provider]  folic acid (FOLVITE) 1 MG tablet Take 1 tablet (1 mg total) by mouth daily. 11/11/15  Yes Paz, Nolon Rod, MD  Multiple Vitamin (MULTIVITAMIN WITH MINERALS) TABS Take 1 tablet by mouth daily. 04/18/13  Yes Hongalgi, Maximino Greenland, MD  tadalafil (CIALIS) 10 MG tablet TAKE ONE TO TWO TABLETS BY MOUTH EVERY OTHER DAY AS NEEDED 03/13/23  Yes Paz, Nolon Rod, MD  zolpidem (AMBIEN) 10 MG tablet TAKE ONE TABLET BY MOUTH EVERY NIGHT AT BEDTIME AS NEEDED FOR SLEEP 05/08/23  Yes Wanda Plump, MD  atorvastatin (LIPITOR) 20 MG tablet Take 1 tablet (20 mg total) by mouth at bedtime. Patient not taking: Reported on 05/17/2023 02/22/23   Wanda Plump, MD     All other systems have been reviewed and were otherwise negative with the exception of those mentioned in the HPI and as above.  Physical Exam: Vitals:   05/17/23 0551  BP: 132/75  Pulse: 64  Resp: 17  Temp: 97.6 F (36.4 C)  SpO2: 97%    Body mass index is 23.8 kg/m.  General: Alert, no acute distress Cardiovascular: No pedal edema Respiratory: No cyanosis, no use of accessory musculature Skin: No lesions in the area of chief complaint Neurologic: Sensation intact distally Psychiatric: Patient is competent for consent with normal mood and affect Lymphatic:  No axillary or cervical lymphadenopathy    Assessment/Plan: LUMBAR RADICULOPATHY secondary to L4-5 spinal stenosis and an L4-5 spondylolisthesis Plan for Procedure(s): LEFT-SIDED LUMBAR 4- LUMBAR 5 TRANSFORAMINAL LUMBAR INTERBODY FUSION AND DECOMPRESSION WITH INSTRUMENTATION AND ALLOGRAFT   Jackelyn Hoehn, MD 05/17/2023 7:36 AM

## 2023-05-17 NOTE — Anesthesia Procedure Notes (Signed)
Procedure Name: Intubation Date/Time: 05/17/2023 8:06 AM  Performed by: Darlina Guys, CRNAPre-anesthesia Checklist: Patient identified, Emergency Drugs available, Suction available and Patient being monitored Patient Re-evaluated:Patient Re-evaluated prior to induction Oxygen Delivery Method: Circle system utilized Preoxygenation: Pre-oxygenation with 100% oxygen Induction Type: IV induction Ventilation: Mask ventilation without difficulty and Oral airway inserted - appropriate to patient size Laryngoscope Size: Mac and 4 Grade View: Grade I Tube type: Oral Tube size: 7.5 mm Number of attempts: 1 Airway Equipment and Method: Stylet Placement Confirmation: ETT inserted through vocal cords under direct vision, positive ETCO2 and breath sounds checked- equal and bilateral Secured at: 23 cm Tube secured with: Tape

## 2023-05-17 NOTE — Op Note (Signed)
PATIENT NAME: Bobby [name]   MEDICAL RECORD NO.:   [id number]   DATE OF BIRTH: 01/28/1951   DATE OF PROCEDURE: 05/17/2023                               OPERATIVE REPORT     PREOPERATIVE DIAGNOSES: 1. Bilateral lumbar radiculopathy. 2. L4-5 spinal stenosis. 3. L4-5 spondylolisthesis 4. Severe L4-5 degenerative disc disease   POSTOPERATIVE DIAGNOSES: 1. Bilateral lumbar radiculopathy. 2. L4-5 spinal stenosis. 3. L4-5 spondylolisthesis 4. Severe L4-5 degenerative disc disease   PROCEDURES: 1. L4/5 decompression 2. Left-sided L4-5 transforaminal lumbar interbody fusion. 3. Right-sided L4-5 posterolateral fusion. 4. Insertion of interbody device x1 (Globus expandable intervertebral spacer). 5. Placement of segmental posterior instrumentation L4, L5 bilaterally  6. Use of local autograft. 7. Use of morselized allograft - Vivigen 8. Intraoperative use of fluoroscopy.   SURGEON:  Estill Bamberg, MD.   ASSISTANTJason Coop, PA-C.   ANESTHESIA:  General endotracheal anesthesia.   COMPLICATIONS:  None.   DISPOSITION:  Stable.   ESTIMATED BLOOD LOSS:  100cc   INDICATIONS FOR SURGERY:  Briefly,  Bobby [name] is a pleasant 72 -year-old male who did present to me with severe and ongoing pain in the right and left legs. I did feel that the symptoms were secondary to the findings noted above.   The patient failed conservative care and did wish to proceed with the procedure  noted above.   OPERATIVE DETAILS:  On 05/17/2023, the patient was brought to surgery and general endotracheal anesthesia was administered.  The patient was placed prone on a well-padded flat Jackson bed with a spinal frame.  Antibiotics were given and a time-out procedure was performed. The back was prepped and draped in the usual fashion.  A midline incision was made overlying the L4-5 intervertebral spaces.  The fascia was incised at the midline.  The paraspinal musculature was bluntly swept  laterally.  Anatomic landmarks for the pedicles were exposed. Using fluoroscopy, I did cannulate the L4 and L5 pedicles bilaterally, using a medial to lateral cortical trajectory technique.  At this point, 6 x 30 mm screws were placed into the right pedicles, and a 40 mm rod was placed into the tulip heads of the screw, and caps were also placed.  Distraction was then applied across the L4-5 intervertebral space, and the caps were then provisionally tightened.  On the left side, bone wax was placed into the cannulated pedicle holes.  I then proceeded with the decompressive aspect of the procedure at the L4-5 level.  A partial facetectomy was performed bilaterally at L4-5, decompressing the L4-5 intervertebral space.  Of note, there was very significant overgrowth of bone, and very severe compression.  I was very pleased with the decompression. With an assistant holding medial retraction of the traversing left L5 nerve, I did perform an annulotomy at the posterolateral aspect of the L4-5 intervertebral space.  I then used a series of curettes and pituitary rongeurs to perform a thorough and complete intervertebral diskectomy.  The intervertebral space was then liberally packed with autograft as well as allograft in the form of Vivigen, as was the appropriate-sized intervertebral spacer.  The spacer was then tamped into position in the usual fashion, and expanded to 9 mm in height. I was very pleased with the press-fit of the spacer.  I then placed 6 mm screws on the left at L4 and L5. A 40-mm rod was  then placed and caps were placed. The distraction was then released on the contralateral side.  All caps were then locked.  The wound was copiously irrigated with a total of approximately 3 L prior to placing the bone graft.  Additional autograft and allograft was then packed into the posterolateral gutter on the right side to help aid in the success of the fusion.  The wound was  explored for any undue  bleeding and there was no substantial bleeding encountered.  Gel-Foam was placed over the laminectomy site.  The wound was then closed in layers using #1 Vicryl followed by 2-0 Vicryl, followed by 4-0 Monocryl.  Benzoin and Steri-Strips were applied followed by sterile dressing.     Of note, Jason Coop was my assistant throughout surgery, and did aid in retraction, suctioning, the decompression, placement of the hardware, and closure.       Estill Bamberg, MD

## 2023-05-17 NOTE — Transfer of Care (Signed)
Immediate Anesthesia Transfer of Care Note  Patient: Bobby [name]  Procedure(s) Performed: LEFT-SIDED LUMBAR 4- LUMBAR 5 TRANSFORAMINAL LUMBAR INTERBODY FUSION AND DECOMPRESSION WITH INSTRUMENTATION AND ALLOGRAFT (Left)  Patient Location: PACU  Anesthesia Type:General  Level of Consciousness: awake, alert , and patient cooperative  Airway & Oxygen Therapy: Patient Spontanous Breathing and Patient connected to face mask oxygen  Post-op Assessment: Report given to RN and Post -op Vital signs reviewed and stable  Post vital signs: Reviewed and stable  Last Vitals:  Vitals Value Taken Time  BP 126/67 05/17/23 1130  Temp 36.1 C 05/17/23 1130  Pulse 73 05/17/23 1136  Resp 9 05/17/23 1136  SpO2 94 % 05/17/23 1136  Vitals shown include unvalidated device data.  Last Pain:  Vitals:   05/17/23 0551  TempSrc: Oral         Complications: No notable events documented.

## 2023-05-18 DIAGNOSIS — Z79899 Other long term (current) drug therapy: Secondary | ICD-10-CM | POA: Diagnosis not present

## 2023-05-18 DIAGNOSIS — I10 Essential (primary) hypertension: Secondary | ICD-10-CM | POA: Diagnosis not present

## 2023-05-18 DIAGNOSIS — Z85828 Personal history of other malignant neoplasm of skin: Secondary | ICD-10-CM | POA: Diagnosis not present

## 2023-05-18 DIAGNOSIS — M48062 Spinal stenosis, lumbar region with neurogenic claudication: Secondary | ICD-10-CM | POA: Diagnosis not present

## 2023-05-18 DIAGNOSIS — M4316 Spondylolisthesis, lumbar region: Secondary | ICD-10-CM | POA: Diagnosis not present

## 2023-05-18 DIAGNOSIS — M5416 Radiculopathy, lumbar region: Secondary | ICD-10-CM | POA: Diagnosis not present

## 2023-05-18 NOTE — Progress Notes (Signed)
Patient alert and oriented, mae's well, voiding adequate amount of urine, swallowing without difficulty, no c/o pain at time of discharge. Patient discharged home with family. Script and discharged instructions given to patient. Patient and family stated understanding of instructions given. Patient has an appointment with Dr. Dumonski 

## 2023-05-18 NOTE — Evaluation (Signed)
Occupational Therapy Evaluation Patient Details Name: Bobby [name] MRN: [id number] DOB: November 03, 1951 Today's Date: 05/18/2023   History of Present Illness Pt is a 72 y.o. male s/p L4-5 decompression, transforaminal interbody fusion, and posterolateral fusion. PMH significant for anxiety, depression, HTN, hyponatremia, melanoma.   Clinical Impression   PTA, pt lived with wife and was independent in ADL, IADL, working, and driving. Upon eval, pt performing UB ADL with supervision and cues for brace application and LB ADL with mod I. Pt educated and demonstrating use of compensatory techniques for bed mobility, LB ADL, grooming, toileting, and shower transfers within precautions. Wife able to independent cue as needed for problem solving and body mechanics during bed mobility and brace application. All education provided and questions answered. Recommending discharge home with no OT follow up at this time. Wife to assist as needed. OT to sign off. Please re-consult if change in status.        Recommendations for follow up therapy are one component of a multi-disciplinary discharge planning process, led by the attending physician.  Recommendations may be updated based on patient status, additional functional criteria and insurance authorization.   Assistance Recommended at Discharge Intermittent Supervision/Assistance  Patient can return home with the following A little help with bathing/dressing/bathroom;Assistance with cooking/housework;Help with stairs or ramp for entrance;Assist for transportation    Functional Status Assessment  Patient has had a recent decline in their functional status and demonstrates the ability to make significant improvements in function in a reasonable and predictable amount of time.  Equipment Recommendations  None recommended by OT    Recommendations for Other Services       Precautions / Restrictions Precautions Precautions: Back Precaution Booklet  Issued: Yes (comment) Precaution Comments: all precautions reviewed within the context of ADl Required Braces or Orthoses: Spinal Brace Spinal Brace: Thoracolumbosacral orthotic (at all times except night time trips to the restroom) Restrictions Weight Bearing Restrictions: No      Mobility Bed Mobility Overal bed mobility: Needs Assistance Bed Mobility: Rolling, Sidelying to Sit, Sit to Sidelying Rolling: Min guard Sidelying to sit: Supervision     Sit to sidelying: Min guard General bed mobility comments: Performing bed mobility 3x this session due to pt initially with poor technique and problem solving to utilize log roll method. Cueing for body mechanics and optimal technique. Core strength decreased and evident with attempts to roll; educated regarding bending BLE to optimize success. Pt with greater success on additional attempts. Needing max multimodal cues to be able to adjust self/scoot up in bed.    Transfers Overall transfer level: Independent                        Balance Overall balance assessment: Needs assistance Sitting-balance support: No upper extremity supported, Feet supported Sitting balance-Leahy Scale: Good     Standing balance support: No upper extremity supported, During functional activity Standing balance-Leahy Scale: Good                             ADL either performed or assessed with clinical judgement   ADL Overall ADL's : Needs assistance/impaired Eating/Feeding: Independent   Grooming: Independent   Upper Body Bathing: Independent   Lower Body Bathing: Modified independent   Upper Body Dressing : Moderate assistance;Supervision/safety Upper Body Dressing Details (indicate cue type and reason): Mod A initially for brace application progressing to min cues. Wife able to indep cue after initial  education and brace adjustment from therapist Lower Body Dressing: Modified independent   Toilet Transfer: Modified  Independent   Toileting- Clothing Manipulation and Hygiene: Supervision/safety Toileting - Clothing Manipulation Details (indicate cue type and reason): incresaed time and cueing for problem solving how he would complete posterior pericare at home Tub/ Shower Transfer: Walk-in shower;Modified independent;Ambulation   Functional mobility during ADLs: Modified independent       Vision Baseline Vision/History: 1 Wears glasses Ability to See in Adequate Light: 0 Adequate Vision Assessment?: No apparent visual deficits     Perception     Praxis      Pertinent Vitals/Pain Pain Assessment Pain Assessment: Faces Faces Pain Scale: Hurts little more Pain Location: low back Pain Descriptors / Indicators: Sore Pain Intervention(s): Limited activity within patient's tolerance, Monitored during session     Hand Dominance     Extremity/Trunk Assessment Upper Extremity Assessment Upper Extremity Assessment: Overall WFL for tasks assessed   Lower Extremity Assessment Lower Extremity Assessment: Defer to PT evaluation   Cervical / Trunk Assessment Cervical / Trunk Assessment: Back Surgery   Communication Communication Communication: No difficulties   Cognition Arousal/Alertness: Awake/alert Behavior During Therapy: WFL for tasks assessed/performed Overall Cognitive Status: Impaired/Different from baseline Area of Impairment: Problem solving, Attention                   Current Attention Level: Sustained         Problem Solving: Slow processing General Comments: Pt requiring assist to tie pants from wife due to decr attention to task and highly distractable. Increased time and up to mod cues for new learning     General Comments  VSS on RA    Exercises     Shoulder Instructions      Home Living Family/patient expects to be discharged to:: Private residence Living Arrangements: Spouse/significant other Available Help at Discharge: Family Type of Home:  House Home Access: Stairs to enter Secretary/administrator of Steps: 5 Entrance Stairs-Rails: Right Home Layout: Multi-level Alternate Level Stairs-Number of Steps: 11 Alternate Level Stairs-Rails: Can reach both Bathroom Shower/Tub: Producer, television/film/video: Standard     Home Equipment: None          Prior Functioning/Environment Prior Level of Function : Independent/Modified Independent;Working/employed;Driving               ADLs Comments: works; recently moved office to home        OT Problem List: Decreased strength;Decreased activity tolerance;Impaired balance (sitting and/or standing);Decreased cognition;Decreased safety awareness;Decreased knowledge of use of DME or AE;Decreased knowledge of precautions      OT Treatment/Interventions:      OT Goals(Current goals can be found in the care plan section) Acute Rehab OT Goals Patient Stated Goal: go home OT Goal Formulation: With patient  OT Frequency:      Co-evaluation              AM-PAC OT "6 Clicks" Daily Activity     Outcome Measure Help from another person eating meals?: None Help from another person taking care of personal grooming?: None Help from another person toileting, which includes using toliet, bedpan, or urinal?: A Little Help from another person bathing (including washing, rinsing, drying)?: None Help from another person to put on and taking off regular upper body clothing?: A Little Help from another person to put on and taking off regular lower body clothing?: None 6 Click Score: 22   End of Session Equipment Utilized During Treatment: Back  brace Nurse Communication: Mobility status  Activity Tolerance: Patient tolerated treatment well Patient left: with family/visitor present (Up in room with PT)  OT Visit Diagnosis: Unsteadiness on feet (R26.81);Muscle weakness (generalized) (M62.81);Other symptoms and signs involving cognitive function                Time:  1610-9604 OT Time Calculation (min): 34 min Charges:  OT General Charges $OT Visit: 1 Visit OT Evaluation $OT Eval Low Complexity: 1 Low OT Treatments $Self Care/Home Management : 8-22 mins  Tyler Deis, OTR/L [facility] Acute Rehabilitation Office: [phone]   Myrla Halsted 05/18/2023, 9:34 AM

## 2023-05-18 NOTE — Plan of Care (Signed)

## 2023-05-18 NOTE — Plan of Care (Signed)
  Problem: Education: Goal: Ability to verbalize activity precautions or restrictions will improve Outcome: Completed/Met Goal: Knowledge of the prescribed therapeutic regimen will improve Outcome: Completed/Met Goal: Understanding of discharge needs will improve Outcome: Completed/Met   Problem: Activity: Goal: Ability to avoid complications of mobility impairment will improve Outcome: Completed/Met Goal: Ability to tolerate increased activity will improve Outcome: Completed/Met Goal: Will remain free from falls Outcome: Completed/Met   Problem: Bowel/Gastric: Goal: Gastrointestinal status for postoperative course will improve Outcome: Completed/Met   Problem: Clinical Measurements: Goal: Ability to maintain clinical measurements within normal limits will improve Outcome: Completed/Met Goal: Postoperative complications will be avoided or minimized Outcome: Completed/Met Goal: Diagnostic test results will improve Outcome: Completed/Met   Problem: Pain Management: Goal: Pain level will decrease Outcome: Completed/Met   

## 2023-05-18 NOTE — Progress Notes (Signed)
    Patient doing well 1 Day PO, substantially improved leg pain/symptoms. He states this is the best he has walked "in years." Expected well controlled LBP. Eating and drinking well, NL B/B function    Physical Exam: Vitals:   05/18/23 0645 05/18/23 0730  BP: (!) 140/80 123/73  Pulse: 68 67  Resp: 18 20  Temp: (!) 97.4 F (36.3 C) 97.6 F (36.4 C)  SpO2: 100% 98%    Dressing in place, CDI, laying in bed comfortably, TLSO at bedside OT at bedside NVI  POD #1 s/p Lumbar fusion L4-5 with resolved pre-op BIL leg pain   - up with PT/OT, encourage ambulation - Percocet for pain, Robaxin for muscle spasms  -Sent to pharm ele[facility] - d/c home today with f/u in 2 weeks

## 2023-05-18 NOTE — Evaluation (Signed)
Physical Therapy Evaluation Patient Details Name: Bobby [name] MRN: [id number] DOB: 06-18-1951 Today's Date: 05/18/2023  History of Present Illness  Pt is a 72 y.o. male s/p L4-5 decompression, transforaminal interbody fusion, and posterolateral fusion. PMH significant for anxiety, depression, HTN, hyponatremia, melanoma.  Clinical Impression  Pt presents to PT with deficits in gait, strength, power, endurance. Pt is able to mobilize for household distances, but continues to have a shuffling gait patter. Pt and spouse report improved stride length compared to pre-surgery. Pt acknowledges back precautions and spouse is present to assist with ADLs. Pt will benefit from continued acute PT services in an effort to improve dynamic gait and balance. PT recommends discharge home when medically ready.       Recommendations for follow up therapy are one component of a multi-disciplinary discharge planning process, led by the attending physician.  Recommendations may be updated based on patient status, additional functional criteria and insurance authorization.  Follow Up Recommendations       Assistance Recommended at Discharge PRN  Patient can return home with the following  A little help with bathing/dressing/bathroom;Assistance with cooking/housework;Assist for transportation    Equipment Recommendations None recommended by PT  Recommendations for Other Services       Functional Status Assessment Patient has had a recent decline in their functional status and demonstrates the ability to make significant improvements in function in a reasonable and predictable amount of time.     Precautions / Restrictions Precautions Precautions: Back Precaution Booklet Issued: Yes (comment) Required Braces or Orthoses: Spinal Brace Spinal Brace: Thoracolumbosacral orthotic (on upon arrival) Restrictions Weight Bearing Restrictions: No      Mobility  Bed Mobility                     Transfers Overall transfer level: Independent                      Ambulation/Gait Ambulation/Gait assistance: Modified independent (Device/Increase time) Gait Distance (Feet): 200 Feet Assistive device: None Gait Pattern/deviations: Step-through pattern, Decreased dorsiflexion - right, Decreased dorsiflexion - left Gait velocity: functional Gait velocity interpretation: 1.31 - 2.62 ft/sec, indicative of limited community ambulator   General Gait Details: pt with shuffling, able to increase foot clearance with verbal cues  Stairs Stairs: Yes Stairs assistance: Modified independent (Device/Increase time) Stair Management: One rail Right, Alternating pattern Number of Stairs: 10    Wheelchair Mobility    Modified Rankin (Stroke Patients Only)       Balance Overall balance assessment: Needs assistance Sitting-balance support: No upper extremity supported, Feet supported Sitting balance-Leahy Scale: Good     Standing balance support: No upper extremity supported, During functional activity Standing balance-Leahy Scale: Good                               Pertinent Vitals/Pain Pain Assessment Pain Assessment: Faces Faces Pain Scale: Hurts little more Pain Location: low back Pain Descriptors / Indicators: Sore Pain Intervention(s): Monitored during session    Home Living Family/patient expects to be discharged to:: Private residence Living Arrangements: Spouse/significant other Available Help at Discharge: Family Type of Home: House Home Access: Stairs to enter Entrance Stairs-Rails: Right Entrance Stairs-Number of Steps: 5 Alternate Level Stairs-Number of Steps: 11 Home Layout: Multi-level Home Equipment: None      Prior Function Prior Level of Function : Independent/Modified Independent;Working/employed;Driving  Hand Dominance        Extremity/Trunk Assessment   Upper Extremity Assessment Upper  Extremity Assessment: Defer to OT evaluation    Lower Extremity Assessment Lower Extremity Assessment: Generalized weakness    Cervical / Trunk Assessment Cervical / Trunk Assessment: Back Surgery  Communication   Communication: No difficulties  Cognition Arousal/Alertness: Awake/alert Behavior During Therapy: WFL for tasks assessed/performed Overall Cognitive Status: Impaired/Different from baseline Area of Impairment: Problem solving                             Problem Solving: Slow processing          General Comments General comments (skin integrity, edema, etc.): VSS on RA    Exercises     Assessment/Plan    PT Assessment Patient needs continued PT services  PT Problem List Decreased strength;Decreased activity tolerance;Decreased balance;Decreased mobility;Decreased knowledge of precautions;Pain       PT Treatment Interventions DME instruction;Gait training;Functional mobility training;Stair training;Balance training;Neuromuscular re-education;Patient/family education;Therapeutic activities;Therapeutic exercise    PT Goals (Current goals can be found in the Care Plan section)  Acute Rehab PT Goals Patient Stated Goal: to go home PT Goal Formulation: With patient Time For Goal Achievement: 05/22/23 Potential to Achieve Goals: Good Additional Goals Additional Goal #1: Pt will score >19/24 on the DGI to indicate a reduced risk for falls    Frequency Min 5X/week     Co-evaluation               AM-PAC PT "6 Clicks" Mobility  Outcome Measure Help needed turning from your back to your side while in a flat bed without using bedrails?: A Little Help needed moving from lying on your back to sitting on the side of a flat bed without using bedrails?: A Little Help needed moving to and from a bed to a chair (including a wheelchair)?: None Help needed standing up from a chair using your arms (e.g., wheelchair or bedside chair)?: None Help needed to  walk in hospital room?: None Help needed climbing 3-5 steps with a railing? : None 6 Click Score: 22    End of Session Equipment Utilized During Treatment: Back brace Activity Tolerance: Patient tolerated treatment well Patient left: with family/visitor present (left standing in room, spouse present) Nurse Communication: Mobility status PT Visit Diagnosis: Other abnormalities of gait and mobility (R26.89);Muscle weakness (generalized) (M62.81);Pain Pain - part of body:  (back)    Time: 4098-1191 PT Time Calculation (min) (ACUTE ONLY): 22 min   Charges:   PT Evaluation $PT Eval Low Complexity: 1 Low          Arlyss Gandy, PT, DPT Acute Rehabilitation Office [phone]   Arlyss Gandy 05/18/2023, 9:07 AM

## 2023-05-21 NOTE — Anesthesia Postprocedure Evaluation (Signed)
Anesthesia Post Note  Patient: Bobby [name]  Procedure(s) Performed: LEFT-SIDED LUMBAR 4- LUMBAR 5 TRANSFORAMINAL LUMBAR INTERBODY FUSION AND DECOMPRESSION WITH INSTRUMENTATION AND ALLOGRAFT (Left)     Patient location during evaluation: PACU Anesthesia Type: General Level of consciousness: awake and alert Pain management: pain level controlled Vital Signs Assessment: post-procedure vital signs reviewed and stable Respiratory status: spontaneous breathing, nonlabored ventilation, respiratory function stable and patient connected to nasal cannula oxygen Cardiovascular status: blood pressure returned to baseline and stable Postop Assessment: no apparent nausea or vomiting Anesthetic complications: no   No notable events documented.  Last Vitals:  Vitals:   05/18/23 0645 05/18/23 0730  BP: (!) 140/80 123/73  Pulse: 68 67  Resp: 18 20  Temp: (!) 36.3 C 36.4 C  SpO2: 100% 98%    Last Pain:  Vitals:   05/18/23 0748  TempSrc:   PainSc: 3                  Kennieth Rad

## 2023-05-22 ENCOUNTER — Encounter (HOSPITAL_COMMUNITY): Payer: Self-pay | Admitting: Orthopedic Surgery

## 2023-06-07 NOTE — Discharge Summary (Signed)
Patient ID: Bobby [name] MRN: [id number] DOB/AGE: July 30, 1951 72 y.o.  Admit date: 05/17/2023 Discharge date: 05/18/2023  Admission Diagnoses:  Principal Problem:   Radiculopathy of lumbar region   Discharge Diagnoses:  Same  Past Medical History:  Diagnosis Date   Anxiety and depression    Hypertension    Hyponatremia 03/2013   ongoing, seeing Dr. Allena Katz   Insomnia    Melanoma in situ of back [facility])    biopsy on 03/05/2020   SIADH (syndrome of inappropriate ADH production) (HCC) 2017   Dr. Allena Katz   Squamous cell carcinoma in situ     Surgeries: Procedure(s): LEFT-SIDED LUMBAR 4- LUMBAR 5 TRANSFORAMINAL LUMBAR INTERBODY FUSION AND DECOMPRESSION WITH INSTRUMENTATION AND ALLOGRAFT on 05/17/2023   Consultants: None  Discharged Condition: Improved  Hospital Course: Bobby [name] is an 72 y.o. male who was admitted 05/17/2023 for operative treatment of Radiculopathy of lumbar region. Patient has severe unremitting pain that affects sleep, daily activities, and work/hobbies. After pre-op clearance the patient was taken to the operating room on 05/17/2023 and underwent  Procedure(s): LEFT-SIDED LUMBAR 4- LUMBAR 5 TRANSFORAMINAL LUMBAR INTERBODY FUSION AND DECOMPRESSION WITH INSTRUMENTATION AND ALLOGRAFT.    Patient was given perioperative antibiotics:  Anti-infectives (From admission, onward)    Start     Dose/Rate Route Frequency Ordered Stop   05/17/23 1600  ceFAZolin (ANCEF) IVPB 2g/100 mL premix        2 g 200 mL/hr over 30 Minutes Intravenous Every 8 hours 05/17/23 1221 05/18/23 0009   05/17/23 0600  ceFAZolin (ANCEF) IVPB 2g/100 mL premix        2 g 200 mL/hr over 30 Minutes Intravenous On call to O.R. 05/17/23 0865 05/17/23 0817        Patient was given sequential compression devices, early ambulation to prevent DVT.  Patient benefited maximally from hospital stay and there were no complications.    Recent vital signs: BP 123/73 (BP Location: Left Arm)    Pulse 67   Temp 97.6 F (36.4 C) (Oral)   Resp 20   Ht 6' (1.829 m)   Wt 79.6 kg   SpO2 98%   BMI 23.80 kg/m    Discharge Medications:   Allergies as of 05/18/2023       Reactions   Tramadol Hives        Medication List     TAKE these medications    atorvastatin 20 MG tablet Commonly known as: LIPITOR Take 1 tablet (20 mg total) by mouth at bedtime.   b complex vitamins capsule Take 1 capsule by mouth daily.   carvedilol 6.25 MG tablet Commonly known as: COREG Take 1 tablet (6.25 mg total) by mouth 2 (two) times daily with a meal.   cholecalciferol 25 MCG (1000 UNIT) tablet Commonly known as: VITAMIN D3 Take 1,000 Units by mouth daily.   clonazePAM 0.5 MG tablet Commonly known as: KLONOPIN TAKE ONE TABLET BY MOUTH TWICE A DAY AS NEEDED FOR ANXIETY What changed: See the new instructions.   Dupixent 300 MG/2ML Sopn Generic drug: Dupilumab Inject into the skin every 14 (fourteen) days.   folic acid 1 MG tablet Commonly known as: FOLVITE Take 1 tablet (1 mg total) by mouth daily.   methocarbamol 500 MG tablet Commonly known as: ROBAXIN Take 1 tablet (500 mg total) by mouth every 6 (six) hours as needed for muscle spasms.   multivitamin with minerals Tabs tablet Take 1 tablet by mouth daily.   oxyCODONE-acetaminophen 5-325 MG tablet  Commonly known as: PERCOCET/ROXICET Take 1-2 tablets by mouth every 4 (four) hours as needed for severe pain.   tadalafil 10 MG tablet Commonly known as: CIALIS TAKE ONE TO TWO TABLETS BY MOUTH EVERY OTHER DAY AS NEEDED   zolpidem 10 MG tablet Commonly known as: AMBIEN TAKE ONE TABLET BY MOUTH EVERY NIGHT AT BEDTIME AS NEEDED FOR SLEEP        Diagnostic Studies: DG Lumbar Spine 2-3 Views  Result Date: 05/17/2023 CLINICAL DATA:  Elective surgery. EXAM: LUMBAR SPINE - 2-3 VIEW COMPARISON:  None Available. FINDINGS: Two fluoroscopic spot views of the lumbar spine obtained in the operating room in frontal and lateral  projections. Posterior rod with intrapedicular screw fusion and interbody spacer at L4-L5. Fluoroscopy time 46 seconds. Dose 28.96 mGy. IMPRESSION: Intraoperative fluoroscopy during L4-L5 fusion. Electronically Signed   By: Narda Rutherford M.D.   On: 05/17/2023 12:31   DG Lumbar Spine 1 View  Result Date: 05/17/2023 CLINICAL DATA:  [id number] Elective surgery [id number] EXAM: LUMBAR SPINE - 1 VIEW COMPARISON:  MRI 02/07/2023 FINDINGS: Portable cross-table lateral view of the lumbar spine obtained in the operating room. Surgical instruments localize posteriorly at the L3 and L4-L5 levels. IMPRESSION: Surgical instruments localize posteriorly at the L3 and L4-L5 levels. Electronically Signed   By: Narda Rutherford M.D.   On: 05/17/2023 12:30   DG C-Arm 1-60 Min-No Report  Result Date: 05/17/2023 Fluoroscopy was utilized by the requesting physician.  No radiographic interpretation.   DG C-Arm 1-60 Min-No Report  Result Date: 05/17/2023 Fluoroscopy was utilized by the requesting physician.  No radiographic interpretation.   DG C-Arm 1-60 Min-No Report  Result Date: 05/17/2023 Fluoroscopy was utilized by the requesting physician.  No radiographic interpretation.    Disposition: Discharge disposition: 01-Home or Self Care       Discharge Instructions     Discharge patient   Complete by: As directed    Discharge disposition: 01-Home or Self Care   Discharge patient date: 05/18/2023      POD #1 s/p Lumbar fusion L4-5 with resolved pre-op BIL leg pain    - up with PT/OT, encourage ambulation - Percocet for pain, Robaxin for muscle spasms -Scripts for pain sent to pharmacy electronically  -D/C instructions sheet printed and in chart -D/C today  -F/U in office 2 weeks   Signed: Eilene Ghazi Isebella Upshur 06/07/2023, 10:40 AM

## 2023-06-18 DIAGNOSIS — L814 Other melanin hyperpigmentation: Secondary | ICD-10-CM | POA: Diagnosis not present

## 2023-06-18 DIAGNOSIS — Z08 Encounter for follow-up examination after completed treatment for malignant neoplasm: Secondary | ICD-10-CM | POA: Diagnosis not present

## 2023-06-18 DIAGNOSIS — Z85828 Personal history of other malignant neoplasm of skin: Secondary | ICD-10-CM | POA: Diagnosis not present

## 2023-06-18 DIAGNOSIS — Z85820 Personal history of malignant melanoma of skin: Secondary | ICD-10-CM | POA: Diagnosis not present

## 2023-06-18 DIAGNOSIS — L570 Actinic keratosis: Secondary | ICD-10-CM | POA: Diagnosis not present

## 2023-06-18 DIAGNOSIS — L821 Other seborrheic keratosis: Secondary | ICD-10-CM | POA: Diagnosis not present

## 2023-06-18 DIAGNOSIS — D225 Melanocytic nevi of trunk: Secondary | ICD-10-CM | POA: Diagnosis not present

## 2023-06-29 DIAGNOSIS — M5416 Radiculopathy, lumbar region: Secondary | ICD-10-CM | POA: Diagnosis not present

## 2023-07-19 DIAGNOSIS — L570 Actinic keratosis: Secondary | ICD-10-CM | POA: Diagnosis not present

## 2023-07-19 DIAGNOSIS — L02223 Furuncle of chest wall: Secondary | ICD-10-CM | POA: Diagnosis not present

## 2023-08-10 ENCOUNTER — Other Ambulatory Visit (HOSPITAL_BASED_OUTPATIENT_CLINIC_OR_DEPARTMENT_OTHER): Payer: Self-pay

## 2023-08-10 DIAGNOSIS — M5416 Radiculopathy, lumbar region: Secondary | ICD-10-CM | POA: Diagnosis not present

## 2023-08-10 MED ORDER — FLUAD 0.5 ML IM SUSY
PREFILLED_SYRINGE | INTRAMUSCULAR | 0 refills | Status: DC
  Filled 2023-08-10: qty 0.5, 1d supply, fill #0

## 2023-08-10 MED ORDER — COMIRNATY 30 MCG/0.3ML IM SUSY
PREFILLED_SYRINGE | INTRAMUSCULAR | 0 refills | Status: DC
  Filled 2023-08-10: qty 0.3, 1d supply, fill #0

## 2023-08-15 ENCOUNTER — Inpatient Hospital Stay: Payer: Medicare HMO | Attending: Hematology & Oncology

## 2023-08-15 ENCOUNTER — Inpatient Hospital Stay (HOSPITAL_BASED_OUTPATIENT_CLINIC_OR_DEPARTMENT_OTHER): Payer: Medicare HMO | Admitting: Hematology & Oncology

## 2023-08-15 ENCOUNTER — Encounter: Payer: Self-pay | Admitting: Hematology & Oncology

## 2023-08-15 VITALS — BP 165/83 | HR 59 | Temp 97.6°F | Resp 20 | Ht 72.0 in | Wt 178.0 lb

## 2023-08-15 DIAGNOSIS — M549 Dorsalgia, unspecified: Secondary | ICD-10-CM | POA: Diagnosis not present

## 2023-08-15 DIAGNOSIS — Z79899 Other long term (current) drug therapy: Secondary | ICD-10-CM | POA: Diagnosis not present

## 2023-08-15 DIAGNOSIS — C9110 Chronic lymphocytic leukemia of B-cell type not having achieved remission: Secondary | ICD-10-CM | POA: Diagnosis not present

## 2023-08-15 DIAGNOSIS — D72820 Lymphocytosis (symptomatic): Secondary | ICD-10-CM | POA: Insufficient documentation

## 2023-08-15 LAB — CBC WITH DIFFERENTIAL (CANCER CENTER ONLY)
Abs Immature Granulocytes: 0.03 10*3/uL (ref 0.00–0.07)
Basophils Absolute: 0.1 10*3/uL (ref 0.0–0.1)
Basophils Relative: 1 %
Eosinophils Absolute: 0.1 10*3/uL (ref 0.0–0.5)
Eosinophils Relative: 1 %
HCT: 37.5 % — ABNORMAL LOW (ref 39.0–52.0)
Hemoglobin: 13 g/dL (ref 13.0–17.0)
Immature Granulocytes: 0 %
Lymphocytes Relative: 74 %
Lymphs Abs: 12.6 10*3/uL — ABNORMAL HIGH (ref 0.7–4.0)
MCH: 31.8 pg (ref 26.0–34.0)
MCHC: 34.7 g/dL (ref 30.0–36.0)
MCV: 91.7 fL (ref 80.0–100.0)
Monocytes Absolute: 0.7 10*3/uL (ref 0.1–1.0)
Monocytes Relative: 4 %
Neutro Abs: 3.4 10*3/uL (ref 1.7–7.7)
Neutrophils Relative %: 20 %
Platelet Count: 210 10*3/uL (ref 150–400)
RBC: 4.09 MIL/uL — ABNORMAL LOW (ref 4.22–5.81)
RDW: 12.6 % (ref 11.5–15.5)
Smear Review: NORMAL
WBC Count: 16.9 10*3/uL — ABNORMAL HIGH (ref 4.0–10.5)
nRBC: 0 % (ref 0.0–0.2)

## 2023-08-15 LAB — CMP (CANCER CENTER ONLY)
ALT: 16 U/L (ref 0–44)
AST: 23 U/L (ref 15–41)
Albumin: 4.3 g/dL (ref 3.5–5.0)
Alkaline Phosphatase: 63 U/L (ref 38–126)
Anion gap: 8 (ref 5–15)
BUN: 10 mg/dL (ref 8–23)
CO2: 29 mmol/L (ref 22–32)
Calcium: 9 mg/dL (ref 8.9–10.3)
Chloride: 95 mmol/L — ABNORMAL LOW (ref 98–111)
Creatinine: 0.91 mg/dL (ref 0.61–1.24)
GFR, Estimated: >60 mL/min (ref >60)
Glucose, Bld: 95 mg/dL (ref 70–99)
Potassium: 4.3 mmol/L (ref 3.5–5.1)
Sodium: 132 mmol/L — ABNORMAL LOW (ref 135–145)
Total Bilirubin: 0.6 mg/dL (ref 0.3–1.2)
Total Protein: 6.4 g/dL — ABNORMAL LOW (ref 6.5–8.1)

## 2023-08-15 LAB — LACTATE DEHYDROGENASE: LDH: 131 U/L (ref 98–192)

## 2023-08-15 NOTE — Progress Notes (Signed)
Hematology and Oncology Follow Up Visit  TAHER WILCH [id number] 14-Jun-1951 72 y.o. 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 Dileonardo is back for follow-up.  He is looking quite good.  He did undergo lower back surgery in late June.  I think he has spinal fusion.  He seemed to do quite nicely.  He does not wear a brace any longer.  I think is still doing some physical therapy.  He is not have any problems with fever.  He is having no problems with cough.  He has had no nausea or vomiting.  He has had no swollen lymph nodes.  He has had no leg swelling.  There is been no bleeding.  I am just happy that he got through his spine surgery.  His life is [place] better now.  Overall, I would have to say that his performance status is probably ECOG 1.    Wt Readings from Last 3 Encounters:  08/15/23 178 lb (80.7 kg)  05/17/23 175 lb 8 oz (79.6 kg)  05/08/23 176 lb 8 oz (80.1 kg)     Medications:  Current Outpatient Medications:    atorvastatin (LIPITOR) 20 MG tablet, Take 1 tablet (20 mg total) by mouth at bedtime., Disp: 90 tablet, Rfl: 1   b complex vitamins capsule, Take 1 capsule by mouth daily., Disp: , Rfl:    carvedilol (COREG) 6.25 MG tablet, Take 1 tablet (6.25 mg total) by mouth 2 (two) times daily with a meal., Disp: 180 tablet, Rfl: 3   cholecalciferol (VITAMIN D3) 25 MCG (1000 UNIT) tablet, Take 1,000 Units by mouth daily., Disp: , Rfl:    clonazePAM (KLONOPIN) 0.5 MG tablet, TAKE ONE TABLET BY MOUTH TWICE A DAY AS NEEDED FOR ANXIETY (Patient taking differently: Take 0.25 mg by mouth 2 (two) times daily as needed for anxiety.), Disp: 60 tablet, Rfl: 3   DUPIXENT 300 MG/2ML SOPN, Inject into the skin every 14 (fourteen) days., Disp: , Rfl:    folic acid (FOLVITE) 1 MG tablet, Take 1 tablet (1 mg total) by mouth daily., Disp: 90 tablet, Rfl: 2   Multiple Vitamin  (MULTIVITAMIN WITH MINERALS) TABS, Take 1 tablet by mouth daily., Disp: , Rfl:    tadalafil (CIALIS) 10 MG tablet, TAKE ONE TO TWO TABLETS BY MOUTH EVERY OTHER DAY AS NEEDED, Disp: 20 tablet, Rfl: 3   zolpidem (AMBIEN) 10 MG tablet, TAKE ONE TABLET BY MOUTH EVERY NIGHT AT BEDTIME AS NEEDED FOR SLEEP, Disp: 90 tablet, Rfl: 1  Allergies:  Allergies  Allergen Reactions   Tramadol Hives    Past Medical History, Surgical history, Social history, and Family History were reviewed and updated.  Review of Systems: Review of Systems  Constitutional: Negative.   HENT:  Negative.    Eyes: Negative.   Respiratory: Negative.    Cardiovascular: Negative.   Gastrointestinal: Negative.   Endocrine: Negative.   Genitourinary: Negative.    Musculoskeletal:  Positive for back pain.  Skin: Negative.   Neurological: Negative.   Hematological: Negative.   Psychiatric/Behavioral: Negative.      Physical Exam:  height is 6' (1.829 m) and weight is 178 lb (80.7 kg). His oral temperature is 97.6 F (36.4 C). His blood pressure is 165/83 (abnormal) and his pulse is 59 (abnormal). His respiration is 20 and oxygen saturation is 100%.   Wt Readings from Last 3 Encounters:  08/15/23 178 lb (  80.7 kg)  05/17/23 175 lb 8 oz (79.6 kg)  05/08/23 176 lb 8 oz (80.1 kg)    Physical Exam Vitals reviewed.  HENT:     Head: Normocephalic and atraumatic.  Eyes:     Pupils: Pupils are equal, round, and reactive to light.  Cardiovascular:     Rate and Rhythm: Normal rate and regular rhythm.     Heart sounds: Normal heart sounds.  Pulmonary:     Effort: Pulmonary effort is normal.     Breath sounds: Normal breath sounds.  Abdominal:     General: Bowel sounds are normal.     Palpations: Abdomen is soft.  Musculoskeletal:        General: No tenderness or deformity. Normal range of motion.     Cervical back: Normal range of motion.     Comments: On the back, he has a healing lumbar laminectomy scar.   Lymphadenopathy:     Cervical: No cervical adenopathy.  Skin:    General: Skin is warm and dry.     Findings: No erythema or rash.  Neurological:     Mental Status: He is alert and oriented to person, place, and time.  Psychiatric:        Behavior: Behavior normal.        Thought Content: Thought content normal.        Judgment: Judgment normal.      Lab Results  Component Value Date   WBC 16.9 (H) 08/15/2023   HGB 13.0 08/15/2023   HCT 37.5 (L) 08/15/2023   MCV 91.7 08/15/2023   PLT 210 08/15/2023     Chemistry      Component Value Date/Time   NA 132 (L) 08/15/2023 1214   NA 138 08/21/2022 0000   K 4.3 08/15/2023 1214   CL 95 (L) 08/15/2023 1214   CO2 29 08/15/2023 1214   BUN 10 08/15/2023 1214   BUN 15 08/21/2022 0000   CREATININE 0.91 08/15/2023 1214   GLU 89 08/21/2022 0000      Component Value Date/Time   CALCIUM 9.0 08/15/2023 1214   ALKPHOS 63 08/15/2023 1214   AST 23 08/15/2023 1214   ALT 16 08/15/2023 1214   BILITOT 0.6 08/15/2023 1214       Impression and Plan: Mr. Bechen is a very nice 72 year old white male.  He has a monoclonal B-cell population of lymphocytes.  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  He got through surgery without any issues.  His blood counts look great.  I am just happy that he is doing well.  I do not see any indication that we have to do a count of intervention for the monoclonal B-cell lymphocytosis.  I would like to see him back now probably about 4 months or so.  Will try to get him through the [place].   Josph Macho, MD 9/25/202412:49 PM

## 2023-08-17 LAB — KAPPA/LAMBDA LIGHT CHAINS
Kappa free light chain: 16.3 mg/L (ref 3.3–19.4)
Kappa, lambda light chain ratio: 1.73 — ABNORMAL HIGH (ref 0.26–1.65)
Lambda free light chains: 9.4 mg/L (ref 5.7–26.3)

## 2023-08-17 LAB — IGG, IGA, IGM
IgA: 86 mg/dL (ref 61–437)
IgG (Immunoglobin G), Serum: 834 mg/dL (ref 603–1613)
IgM (Immunoglobulin M), Srm: 48 mg/dL (ref 15–143)

## 2023-08-20 LAB — PROTEIN ELECTROPHORESIS, SERUM, WITH REFLEX
A/G Ratio: 1.5 (ref 0.7–1.7)
Albumin ELP: 4 g/dL (ref 2.9–4.4)
Alpha-1-Globulin: 0.2 g/dL (ref 0.0–0.4)
Alpha-2-Globulin: 0.7 g/dL (ref 0.4–1.0)
Beta Globulin: 0.9 g/dL (ref 0.7–1.3)
Gamma Globulin: 0.8 g/dL (ref 0.4–1.8)
Globulin, Total: 2.6 g/dL (ref 2.2–3.9)
Total Protein ELP: 6.6 g/dL (ref 6.0–8.5)

## 2023-08-20 NOTE — Therapy (Signed)
OUTPATIENT PHYSICAL THERAPY THORACOLUMBAR EVALUATION   Patient Name: Bobby [name] MRN: [id number] DOB:10-18-1951, 72 y.o., male Today's Date: 08/20/2023  END OF SESSION:   Past Medical History:  Diagnosis Date   Anxiety and depression    Hypertension    Hyponatremia 03/2013   ongoing, seeing Dr. Allena Katz   Insomnia    Melanoma in situ of back [facility])    biopsy on 03/05/2020   SIADH (syndrome of inappropriate ADH production) (HCC) 2017   Dr. Allena Katz   Squamous cell carcinoma in situ    Past Surgical History:  Procedure Laterality Date   BRONCHIAL BIOPSY  11/27/2022   Procedure: BRONCHIAL BIOPSIES;  Surgeon: Leslye Peer, MD;  Location: [facility] ENDOSCOPY;  Service: Cardiopulmonary;;   BRONCHIAL BRUSHINGS  11/27/2022   Procedure: BRONCHIAL BRUSHINGS;  Surgeon: Leslye Peer, MD;  Location: [facility] ENDOSCOPY;  Service: Cardiopulmonary;;   BRONCHIAL WASHINGS  11/27/2022   Procedure: BRONCHIAL WASHINGS;  Surgeon: Leslye Peer, MD;  Location: [name];  Service: Cardiopulmonary;;   MOHS SURGERY  ~ 04/2020   face, [facility]   SHOULDER SURGERY  1980s   right shoulder   TRANSFORAMINAL LUMBAR INTERBODY FUSION (TLIF) WITH PEDICLE SCREW FIXATION 1 LEVEL Left 05/17/2023   Procedure: LEFT-SIDED LUMBAR 4- LUMBAR 5 TRANSFORAMINAL LUMBAR INTERBODY FUSION AND DECOMPRESSION WITH INSTRUMENTATION AND ALLOGRAFT;  Surgeon: Estill Bamberg, MD;  Location: [facility] OR;  Service: Orthopedics;  Laterality: Left;   VIDEO BRONCHOSCOPY N/A 11/27/2022   Procedure: VIDEO BRONCHOSCOPY WITH FLUORO;  Surgeon: Leslye Peer, MD;  Location: [facility] ENDOSCOPY;  Service: Cardiopulmonary;  Laterality: N/A;   Patient Active Problem List   Diagnosis Date Noted   Radiculopathy of lumbar region 05/17/2023   CLL (chronic lymphocytic leukemia) (HCC) 02/21/2023   Abnormal CT of the chest 11/01/2022   Melanoma in situ of back (HCC) 03/16/2020   Squamous cell carcinoma in situ 08/21/2019   Vitamin D deficiency 05/29/2019   Cough 01/01/2016    PCP NOTES >>>> 09/28/2015   Hypogonadism in male 05/13/2015   SIADH (syndrome of inappropriate ADH production) (HCC) 04/13/2013   Memory problem 04/13/2013   Annual physical exam 03/06/2012   THORACOLUMBAR SCOLIOSIS, MILD 08/18/2010   BACK PAIN, LUMBAR 07/21/2010   Anxiety state 04/01/2009   Essential hypertension, benign 01/04/2009   INSOMNIA-SLEEP DISORDER-UNSPEC 09/08/2008    PCP: Wanda Plump, MD   REFERRING PROVIDER: Estill Bamberg, MD   REFERRING DIAG: S32.010A (ICD-10-CM) - Wedge compression fracture of first lumbar vertebra, initial encounter for closed fracture   Rationale for Evaluation and Treatment: Rehabilitation  THERAPY DIAG:  No diagnosis found.  ONSET DATE: s/p L L4-5 transforaminal lumbar interbody fusion and decompression, with instrumentation and allograft on 05/17/2023  SUBJECTIVE:  SUBJECTIVE STATEMENT: ***  PERTINENT HISTORY:  Chronic lymphocytic leukemia, s/p L L4-5 transforaminal lumbar interbody fusion and decompression, with instrumentation and allograft on 05/17/2023, SIADH, HTN, Anxiety and Depression, memory problems.   PAIN:  Are you having pain? {OPRCPAIN:27236}  PRECAUTIONS: {Therapy precautions:24002}  RED FLAGS: {PT Red Flags:29287}   WEIGHT BEARING RESTRICTIONS: {Yes ***/No:24003}  FALLS:  Has patient fallen in last 6 months? {fallsyesno:27318}  LIVING ENVIRONMENT: Lives with: {OPRC lives with:25569::"lives with their family"} Lives in: {Lives in:25570} Stairs: {opstairs:27293} Has following equipment at home: {Assistive devices:23999}  OCCUPATION: ***  PLOF: {PLOF:24004}  PATIENT GOALS: ***  NEXT MD VISIT: ***  OBJECTIVE:   DIAGNOSTIC FINDINGS:  Result Date: 05/17/2023 CLINICAL DATA:  Elective surgery. EXAM: LUMBAR SPINE - 2-3 VIEW  COMPARISON:  None Available. FINDINGS: Two fluoroscopic spot views of the lumbar spine obtained in the operating room in frontal and lateral projections. Posterior rod with intrapedicular screw fusion and interbody spacer at L4-L5. Fluoroscopy time 46 seconds. Dose 28.96 mGy. IMPRESSION: Intraoperative fluoroscopy during L4-L5 fusion  PATIENT SURVEYS:  {rehab surveys:24030}  COGNITION: Overall cognitive status: {cognition:24006}     SENSATION: {sensation:27233}  MUSCLE LENGTH: Hamstrings: Right *** deg; Left *** deg Maisie Fus test: Right *** deg; Left *** deg  POSTURE: {posture:25561}  PALPATION: ***  LUMBAR ROM:   AROM eval  Flexion   Extension   Right lateral flexion   Left lateral flexion   Right rotation   Left rotation    (Blank rows = not tested)  LOWER EXTREMITY ROM:      LOWER EXTREMITY MMT:    MMT Right eval Left eval  Hip flexion    Hip extension    Hip abduction    Hip adduction    Knee flexion    Knee extension    Ankle dorsiflexion    Ankle plantarflexion     (Blank rows = not tested)  LUMBAR SPECIAL TESTS:  {lumbar special test:25242}  FUNCTIONAL TESTS:  {Functional tests:24029}  GAIT: Distance walked: *** Assistive device utilized: {Assistive devices:23999} Level of assistance: {Levels of assistance:24026} Comments: ***  TODAY'S TREATMENT:                                                                                                                              DATE: ***    PATIENT EDUCATION:  Education details: *** Person educated: {Person educated:25204} Education method: {Education Method:25205} Education comprehension: {Education Comprehension:25206}  HOME EXERCISE PROGRAM: ***  ASSESSMENT:  CLINICAL IMPRESSION: Bobby [name]  is a 72 y.o. male who was seen today for physical therapy evaluation and treatment for s/p L4-5 fusion surgery on 05/17/23. He had HHPT ***    Patient presents with *** symptoms which are  interfering with ADLs and are impacting quality of life.  On Modified Oswestry patient scored *** demonstrated *** disability.    ***    OBJECTIVE IMPAIRMENTS: {opptimpairments:25111}.   ACTIVITY LIMITATIONS: {activitylimitations:27494}  PARTICIPATION LIMITATIONS: {participationrestrictions:25113}  PERSONAL FACTORS: {Personal  factors:25162} are also affecting patient's functional outcome.   REHAB POTENTIAL: {rehabpotential:25112}  CLINICAL DECISION MAKING: {clinical decision making:25114}  EVALUATION COMPLEXITY: {Evaluation complexity:25115}   GOALS: Goals reviewed with patient? {yes/no:20286}  SHORT TERM GOALS: Target date: {follow up:25551}   Patient will be independent with initial HEP.  Baseline: *** Goal status: {GOALSTATUS:25110}   LONG TERM GOALS: Target date: {follow up:25551}   Patient will be independent with advanced/ongoing HEP to improve outcomes and carryover.  Baseline: *** Goal status: {GOALSTATUS:25110}  2.  Patient will report 75% improvement in low back pain to improve QOL.  Baseline: *** Goal status: {GOALSTATUS:25110}  3.  Patient will demonstrate full pain free lumbar ROM to perform ADLs.   Baseline: *** Goal status: {GOALSTATUS:25110}  4.  Patient will demonstrate improved functional strength as demonstrated by ***. Baseline: *** Goal status: {GOALSTATUS:25110}  5.  Patient will report at least 6 points improvement on modified Oswestry to demonstrate improved functional ability.  Baseline: *** Goal status: {GOALSTATUS:25110}   6.  Patient will tolerate *** min of (standing/sitting/walking) to perform ***. Baseline: *** Goal status: {GOALSTATUS:25110}  7.   Patient will report centralization of radicular symptoms.  Baseline: *** Goal status: {GOALSTATUS:25110}  8. Patient will report *** on FOTO Baseline: *** Goal status: {GOALSTATUS:25110}   PLAN:  PT FREQUENCY: {rehab frequency:25116}  PT DURATION: {rehab  duration:25117}  PLANNED INTERVENTIONS: {rehab planned interventions:25118::"Therapeutic exercises","Therapeutic activity","Neuromuscular re-education","Balance training","Gait training","Patient/Family education","Self Care","Joint mobilization"}.  PLAN FOR NEXT SESSION: ***   Jena Gauss, PT, DPT 08/20/2023, 10:45 AM

## 2023-08-21 ENCOUNTER — Other Ambulatory Visit: Payer: Self-pay

## 2023-08-21 ENCOUNTER — Ambulatory Visit: Payer: Medicare HMO | Attending: Orthopedic Surgery | Admitting: Physical Therapy

## 2023-08-21 ENCOUNTER — Telehealth: Payer: Self-pay | Admitting: Internal Medicine

## 2023-08-21 DIAGNOSIS — R2689 Other abnormalities of gait and mobility: Secondary | ICD-10-CM | POA: Insufficient documentation

## 2023-08-21 DIAGNOSIS — M5459 Other low back pain: Secondary | ICD-10-CM | POA: Diagnosis present

## 2023-08-21 DIAGNOSIS — M48062 Spinal stenosis, lumbar region with neurogenic claudication: Secondary | ICD-10-CM | POA: Insufficient documentation

## 2023-08-21 DIAGNOSIS — M6281 Muscle weakness (generalized): Secondary | ICD-10-CM | POA: Insufficient documentation

## 2023-08-21 NOTE — Therapy (Incomplete)
OUTPATIENT PHYSICAL THERAPY THORACOLUMBAR TREATMENT   Patient Name: Bobby [name] MRN: [id number] DOB:10/05/1951, 72 y.o., male Today's Date: 08/21/2023  END OF SESSION:    Past Medical History:  Diagnosis Date   Anxiety and depression    Hypertension    Hyponatremia 03/2013   ongoing, seeing Dr. Allena Katz   Insomnia    Melanoma in situ of back [facility])    biopsy on 03/05/2020   SIADH (syndrome of inappropriate ADH production) (HCC) 2017   Dr. Allena Katz   Squamous cell carcinoma in situ    Past Surgical History:  Procedure Laterality Date   BRONCHIAL BIOPSY  11/27/2022   Procedure: BRONCHIAL BIOPSIES;  Surgeon: Leslye Peer, MD;  Location: [facility] ENDOSCOPY;  Service: Cardiopulmonary;;   BRONCHIAL BRUSHINGS  11/27/2022   Procedure: BRONCHIAL BRUSHINGS;  Surgeon: Leslye Peer, MD;  Location: [facility] ENDOSCOPY;  Service: Cardiopulmonary;;   BRONCHIAL WASHINGS  11/27/2022   Procedure: BRONCHIAL WASHINGS;  Surgeon: Leslye Peer, MD;  Location: [name];  Service: Cardiopulmonary;;   MOHS SURGERY  ~ 04/2020   face, [facility]   SHOULDER SURGERY  1980s   right shoulder   TRANSFORAMINAL LUMBAR INTERBODY FUSION (TLIF) WITH PEDICLE SCREW FIXATION 1 LEVEL Left 05/17/2023   Procedure: LEFT-SIDED LUMBAR 4- LUMBAR 5 TRANSFORAMINAL LUMBAR INTERBODY FUSION AND DECOMPRESSION WITH INSTRUMENTATION AND ALLOGRAFT;  Surgeon: Estill Bamberg, MD;  Location: [facility] OR;  Service: Orthopedics;  Laterality: Left;   VIDEO BRONCHOSCOPY N/A 11/27/2022   Procedure: VIDEO BRONCHOSCOPY WITH FLUORO;  Surgeon: Leslye Peer, MD;  Location: [facility] ENDOSCOPY;  Service: Cardiopulmonary;  Laterality: N/A;   Patient Active Problem List   Diagnosis Date Noted   Radiculopathy of lumbar region 05/17/2023   CLL (chronic lymphocytic leukemia) (HCC) 02/21/2023   Abnormal CT of the chest 11/01/2022   Melanoma in situ of back (HCC) 03/16/2020   Squamous cell carcinoma in situ 08/21/2019   Vitamin D deficiency 05/29/2019   Cough 01/01/2016    PCP NOTES >>>> 09/28/2015   Hypogonadism in male 05/13/2015   SIADH (syndrome of inappropriate ADH production) (HCC) 04/13/2013   Memory problem 04/13/2013   Annual physical exam 03/06/2012   Idiopathic scoliosis and kyphoscoliosis 08/18/2010   BACK PAIN, LUMBAR 07/21/2010   Anxiety state 04/01/2009   Essential hypertension, benign 01/04/2009   INSOMNIA-SLEEP DISORDER-UNSPEC 09/08/2008    PCP: Wanda Plump, MD   REFERRING PROVIDER: Estill Bamberg, MD   REFERRING DIAG: S32.010A (ICD-10-CM) - Wedge compression fracture of first lumbar vertebra, initial encounter for closed fracture   Rationale for Evaluation and Treatment: Rehabilitation  THERAPY DIAG:  No diagnosis found.  ONSET DATE: s/p L L4-5 transforaminal lumbar interbody fusion and decompression, with instrumentation and allograft on 05/17/2023  SUBJECTIVE:  SUBJECTIVE STATEMENT: ***  Eval: Patient had surgery about 12 weeks ago. Had to wear TLSO for 6 weeks, then x-rays, then just had to wear the LSO, now does not have to wear any brace.  Did not have any PT during that time.  He had weight restrictions etc, to make sure healed, has one more follow-up for X-rays, but then should be released.  He reports that the pain disappeared right after surgery.  Surgeon wants him to walk more.  He had previous PT to strengthen back prior to surgery.    PERTINENT HISTORY:  Chronic lymphocytic leukemia, s/p L L4-5 transforaminal lumbar interbody fusion and decompression, with instrumentation and allograft on 05/17/2023, SIADH, HTN, Anxiety and Depression.  PAIN:  Are you having pain? Yes: NPRS scale: 1/10 Pain location: low back Pain description: just a feeling, not really pain Aggravating factors: sitting too much Relieving factors:  walking  PRECAUTIONS: Back  no lifting more than ~ 20lbs   RED FLAGS: None   WEIGHT BEARING RESTRICTIONS: No  FALLS:  Has patient fallen in last 6 months? No  LIVING ENVIRONMENT: Lives with: lives with their spouse Lives in: House/apartment Stairs: Yes: Internal: 11 steps; can reach both and External: 5 steps; on right going up Has following equipment at home: None  OCCUPATION: works at desk, on computer most of the day  PLOF: Independent; likes fly fishing  PATIENT GOALS: get back muscles stronger, walk more, get back to fly fishing and biking  NEXT MD VISIT: beginning of November  OBJECTIVE:   DIAGNOSTIC FINDINGS:  Result Date: 05/17/2023 CLINICAL DATA:  Elective surgery. EXAM: LUMBAR SPINE - 2-3 VIEW COMPARISON:  None Available. FINDINGS: Two fluoroscopic spot views of the lumbar spine obtained in the operating room in frontal and lateral projections. Posterior rod with intrapedicular screw fusion and interbody spacer at L4-L5. Fluoroscopy time 46 seconds. Dose 28.96 mGy. IMPRESSION: Intraoperative fluoroscopy during L4-L5 fusion  PATIENT SURVEYS:  Modified Oswestry 6/50   COGNITION: Overall cognitive status: Within functional limits for tasks assessed     SENSATION: WFL  MUSCLE LENGTH: Hamstrings: Right 140 deg; Left 130 deg  POSTURE: flexed trunk ~ 15 deg walking   LUMBAR ROM:   AROM eval  Flexion 85 tight hamstrings  Extension 30   Right lateral flexion To knees  Left lateral flexion To knees  Right rotation WNL  Left rotation WNL   (Blank rows = not tested)  LOWER EXTREMITY ROM:   very good hip mobility for both ER and IR, symmetric and pain free   LOWER EXTREMITY MMT:    MMT Right eval Left eval  Hip flexion 5 5  Hip extension 4+ 4+  Hip abduction 5 5  Hip adduction 5 5  Knee flexion 5 5  Knee extension 5 5  Ankle dorsiflexion 5 5  Ankle plantarflexion 5 5   (Blank rows = not tested)  FUNCTIONAL TESTS:  6 minute walk test:  TBD Functional gait assessment: TBD  GAIT: Distance walked: 33' Assistive device utilized: None Level of assistance: Complete Independence Comments: maintains forward flexion   TODAY'S TREATMENT:  DATE:  08/22/23 Nustep Supine core Prone core    Eval: Self Care: Findings, POC, initial HEP Bridges x 10 - discomfort, after cues for TrA bracing - no pain Dynamic hamstring stretches x 10 each side Supine SLR x 10  SLR x 10 bil      PATIENT EDUCATION:  Education details: see above Person educated: Patient Education method: Programmer, multimedia, Demonstration, Verbal cues, and Handouts Education comprehension: verbalized understanding and returned demonstration  HOME EXERCISE PROGRAM: Access Code: [email or web address] URL: [email or web address] Date: 08/21/2023 Prepared by: Harrie Foreman  Exercises - Supine Posterior Pelvic Tilt  - 1 x daily - 7 x weekly - 1 sets - 10 reps - Supine Bridge  - 1 x daily - 7 x weekly - 2 sets - 10 reps - Supine Active Straight Leg Raise  - 1 x daily - 7 x weekly - 2 sets - 10 reps - Supine Hamstring Stretch  - 1 x daily - 7 x weekly - 1 sets - 10 reps  ASSESSMENT:  CLINICAL IMPRESSION: ***  OBJECTIVE IMPAIRMENTS: Abnormal gait, decreased activity tolerance, decreased endurance, difficulty walking, decreased ROM, decreased strength, impaired flexibility, postural dysfunction, and pain.   ACTIVITY LIMITATIONS: carrying, lifting, bending, standing, and locomotion level  PARTICIPATION LIMITATIONS: community activity and yard work  PERSONAL FACTORS: Time since onset of injury/illness/exacerbation and 1-2 comorbidities: Chronic lymphocytic leukemia, s/p L L4-5 transforaminal lumbar interbody fusion and decompression, with instrumentation and allograft on 05/17/2023, SIADH, HTN, Anxiety and Depression.  are also affecting  patient's functional outcome.   REHAB POTENTIAL: Good  CLINICAL DECISION MAKING: Evolving/moderate complexity  EVALUATION COMPLEXITY: Moderate   GOALS: Goals reviewed with patient? Yes  SHORT TERM GOALS: Target date: 09/04/2023   Patient will be independent with initial HEP.  Baseline: given Goal status: INITIAL   LONG TERM GOALS: Target date: 10/16/2023   Patient will be independent with advanced/ongoing HEP to improve outcomes and carryover.  Baseline:  Goal status: INITIAL  2.  Patient will report 75% improvement in low back pain to improve QOL.  Baseline: *** Goal status: {GOALSTATUS:25110}  3.  Patient will demonstrate full pain free lumbar ROM to perform ADLs.   Baseline: *** Goal status: {GOALSTATUS:25110}  4.  Patient will demonstrate improved functional strength as demonstrated by ***. Baseline: *** Goal status: {GOALSTATUS:25110}  5.  Patient will report at least 6 points improvement on modified Oswestry to demonstrate improved functional ability.  Baseline: *** Goal status: {GOALSTATUS:25110}   6.  Patient will tolerate *** min of (standing/sitting/walking) to perform ***. Baseline: *** Goal status: {GOALSTATUS:25110}  7.   Patient will report centralization of radicular symptoms.  Baseline: *** Goal status: {GOALSTATUS:25110}  8. Patient will report *** on FOTO Baseline: *** Goal status: {GOALSTATUS:25110}   PLAN:  PT FREQUENCY: {rehab frequency:25116}  PT DURATION: {rehab duration:25117}  PLANNED INTERVENTIONS: {rehab planned interventions:25118::"Therapeutic exercises","Therapeutic activity","Neuromuscular re-education","Balance training","Gait training","Patient/Family education","Self Care","Joint mobilization"}.  PLAN FOR NEXT SESSION: ***   Javius Sylla, PT 08/21/2023, 4:30 PM

## 2023-08-22 ENCOUNTER — Ambulatory Visit: Payer: Medicare HMO | Admitting: Physical Therapy

## 2023-08-22 ENCOUNTER — Encounter: Payer: Self-pay | Admitting: Physical Therapy

## 2023-08-22 ENCOUNTER — Other Ambulatory Visit: Payer: Self-pay | Admitting: Family

## 2023-08-22 ENCOUNTER — Ambulatory Visit: Payer: Medicare HMO | Admitting: *Deleted

## 2023-08-22 DIAGNOSIS — R2689 Other abnormalities of gait and mobility: Secondary | ICD-10-CM

## 2023-08-22 DIAGNOSIS — M48062 Spinal stenosis, lumbar region with neurogenic claudication: Secondary | ICD-10-CM

## 2023-08-22 DIAGNOSIS — Z0000 Encounter for general adult medical examination without abnormal findings: Secondary | ICD-10-CM | POA: Diagnosis not present

## 2023-08-22 DIAGNOSIS — M6281 Muscle weakness (generalized): Secondary | ICD-10-CM

## 2023-08-22 DIAGNOSIS — M5459 Other low back pain: Secondary | ICD-10-CM

## 2023-08-22 MED ORDER — CLONAZEPAM 0.5 MG PO TABS: ORAL_TABLET | ORAL | 0 refills | Status: DC

## 2023-08-22 NOTE — Therapy (Signed)
OUTPATIENT PHYSICAL THERAPY THORACOLUMBAR EVALUATION   Patient Name: Bobby [name] MRN: [id number] DOB:[birth year], 72 y.o., male Today's Date: 08/22/2023  END OF SESSION:  PT End of Session - 08/22/23 1318     Visit Number 2    Date for PT Re-Evaluation 10/16/23    Authorization Type [employer] MCR    Progress Note Due on Visit 10    PT Start Time 1318    PT Stop Time 1403    PT Time Calculation (min) 45 min    Activity Tolerance Patient tolerated treatment well    Behavior During Therapy [facility] for tasks assessed/performed             Past Medical History:  Diagnosis Date   Anxiety and depression    Hypertension    Hyponatremia 03/2013   ongoing, seeing Dr. Allena Katz   Insomnia    Melanoma in situ of back [facility])    biopsy on 03/05/2020   SIADH (syndrome of inappropriate ADH production) (HCC) 2017   Dr. Allena Katz   Squamous cell carcinoma in situ    Past Surgical History:  Procedure Laterality Date   BRONCHIAL BIOPSY  11/27/2022   Procedure: BRONCHIAL BIOPSIES;  Surgeon: Leslye Peer, MD;  Location: [facility] ENDOSCOPY;  Service: Cardiopulmonary;;   BRONCHIAL BRUSHINGS  11/27/2022   Procedure: BRONCHIAL BRUSHINGS;  Surgeon: Leslye Peer, MD;  Location: [facility] ENDOSCOPY;  Service: Cardiopulmonary;;   BRONCHIAL WASHINGS  11/27/2022   Procedure: BRONCHIAL WASHINGS;  Surgeon: Leslye Peer, MD;  Location: [name];  Service: Cardiopulmonary;;   MOHS SURGERY  ~ 04/2020   face, [facility]   SHOULDER SURGERY  1980s   right shoulder   TRANSFORAMINAL LUMBAR INTERBODY FUSION (TLIF) WITH PEDICLE SCREW FIXATION 1 LEVEL Left 05/17/2023   Procedure: LEFT-SIDED LUMBAR 4- LUMBAR 5 TRANSFORAMINAL LUMBAR INTERBODY FUSION AND DECOMPRESSION WITH INSTRUMENTATION AND ALLOGRAFT;  Surgeon: Estill Bamberg, MD;  Location: [facility] OR;  Service: Orthopedics;  Laterality: Left;   VIDEO BRONCHOSCOPY N/A 11/27/2022   Procedure: VIDEO BRONCHOSCOPY WITH FLUORO;  Surgeon: Leslye Peer, MD;  Location: [facility] ENDOSCOPY;  Service:  Cardiopulmonary;  Laterality: N/A;   Patient Active Problem List   Diagnosis Date Noted   Radiculopathy of lumbar region 05/17/2023   CLL (chronic lymphocytic leukemia) (HCC) 02/21/2023   Abnormal CT of the chest 11/01/2022   Melanoma in situ of back (HCC) 03/16/2020   Squamous cell carcinoma in situ 08/21/2019   Vitamin D deficiency 05/29/2019   Cough 01/01/2016   PCP NOTES >>>> 09/28/2015   Hypogonadism in male 05/13/2015   SIADH (syndrome of inappropriate ADH production) (HCC) 04/13/2013   Memory problem 04/13/2013   Annual physical exam 03/06/2012   Idiopathic scoliosis and kyphoscoliosis 08/18/2010   BACK PAIN, LUMBAR 07/21/2010   Anxiety state 04/01/2009   Essential hypertension, benign 01/04/2009   INSOMNIA-SLEEP DISORDER-UNSPEC 09/08/2008    PCP: Wanda Plump, MD   REFERRING PROVIDER: Estill Bamberg, MD   REFERRING DIAG: S32.010A (ICD-10-CM) - Wedge compression fracture of first lumbar vertebra, initial encounter for closed fracture   Rationale for Evaluation and Treatment: Rehabilitation  THERAPY DIAG:  Other low back pain  Other abnormalities of gait and mobility  Muscle weakness (generalized)  Spinal stenosis of lumbar region with neurogenic claudication  ONSET DATE: s/p L L4-5 transforaminal lumbar interbody fusion and decompression, with instrumentation and allograft on 05/17/2023  SUBJECTIVE:  SUBJECTIVE STATEMENT: No pain reported today.  Eval: Patient had surgery about 12 weeks ago. Had to wear TLSO for 6 weeks, then x-rays, then just had to wear the LSO, now does not have to wear any brace.  Did not have any PT during that time.  He had weight restrictions etc, to make sure healed, has one more follow-up for X-rays, but then should be released.  He reports that the pain  disappeared right after surgery.  Surgeon wants him to walk more.  He had previous PT to strengthen back prior to surgery.    PERTINENT HISTORY:  Chronic lymphocytic leukemia, s/p L L4-5 transforaminal lumbar interbody fusion and decompression, with instrumentation and allograft on 05/17/2023, SIADH, HTN, Anxiety and Depression.  PAIN:  Are you having pain? Yes: NPRS scale: 0/10 Pain location: low back Pain description: just a feeling, not really pain Aggravating factors: sitting too much Relieving factors: walking  PRECAUTIONS: Back  no lifting more than ~ 20lbs   RED FLAGS: None   WEIGHT BEARING RESTRICTIONS: No  FALLS:  Has patient fallen in last 6 months? No  LIVING ENVIRONMENT: Lives with: lives with their spouse Lives in: House/apartment Stairs: Yes: Internal: 11 steps; can reach both and External: 5 steps; on right going up Has following equipment at home: None  OCCUPATION: works at desk, on computer most of the day  PLOF: Independent; likes fly fishing  PATIENT GOALS: get back muscles stronger, walk more, get back to fly fishing and biking  NEXT MD VISIT: beginning of November  OBJECTIVE:   DIAGNOSTIC FINDINGS:  Result Date: 05/17/2023 CLINICAL DATA:  Elective surgery. EXAM: LUMBAR SPINE - 2-3 VIEW COMPARISON:  None Available. FINDINGS: Two fluoroscopic spot views of the lumbar spine obtained in the operating room in frontal and lateral projections. Posterior rod with intrapedicular screw fusion and interbody spacer at L4-L5. Fluoroscopy time 46 seconds. Dose 28.96 mGy. IMPRESSION: Intraoperative fluoroscopy during L4-L5 fusion  PATIENT SURVEYS:  Modified Oswestry 6/50   COGNITION: Overall cognitive status: Within functional limits for tasks assessed     SENSATION: WFL  MUSCLE LENGTH: Hamstrings: Right 140 deg; Left 130 deg  POSTURE: flexed trunk ~ 15 deg walking   LUMBAR ROM:   AROM eval  Flexion 85 tight hamstrings  Extension 30   Right lateral  flexion To knees  Left lateral flexion To knees  Right rotation WNL  Left rotation WNL   (Blank rows = not tested)  LOWER EXTREMITY ROM:   very good hip mobility for both ER and IR, symmetric and pain free   LOWER EXTREMITY MMT:    MMT Right eval Left eval  Hip flexion 5 5  Hip extension 4+ 4+  Hip abduction 5 5  Hip adduction 5 5  Knee flexion 5 5  Knee extension 5 5  Ankle dorsiflexion 5 5  Ankle plantarflexion 5 5   (Blank rows = not tested)  FUNCTIONAL TESTS:  6 minute walk test: TBD Functional gait assessment: TBD  GAIT: Distance walked: 69' Assistive device utilized: None Level of assistance: Complete Independence Comments: maintains forward flexion   TODAY'S TREATMENT:  DATE:  08/22/23 - 1025 ft for warm up - cues to stand upright and increase heel strike Sit to stand x 10 Gastroc stretch 2x 30 sec B Post pelvic tilt 5 sec x 10 Bridge x 10 Supine SLR x 10 B cues to keep quads engaged (L difficult) Dynamic HS stretch x 12 B Worked on TA contraction and trying to isolate from general abs. Used various cues including pelvic floor. Patient able to engage but has difficulty isolating.  08/21/23 Self Care: Findings, POC, initial HEP Bridges x 10 - discomfort, after cues for TrA bracing - no pain Dynamic hamstring stretches x 10 each side Supine SLR x 10  SLR x 10 bil      PATIENT EDUCATION:  Education details: see above Person educated: Patient Education method: Explanation, Demonstration, Verbal cues, and Handouts Education comprehension: verbalized understanding and returned demonstration  HOME EXERCISE PROGRAM: Access Code: [email or web address] URL: [email or web address] Date: 08/21/2023 Prepared by: Harrie Foreman  Exercises - Supine Posterior Pelvic Tilt  - 1 x daily - 7 x weekly - 1 sets - 10 reps - Supine  Bridge  - 1 x daily - 7 x weekly - 2 sets - 10 reps - Supine Active Straight Leg Raise  - 1 x daily - 7 x weekly - 2 sets - 10 reps - Supine Hamstring Stretch  - 1 x daily - 7 x weekly - 1 sets - 10 reps  ASSESSMENT:  CLINICAL IMPRESSION: [name] was able to walk 1,025 feet in 6 minutes today which we used as a warm up to try to get him into a walking program at home. He demonstrates forward trunk lean and decreased heel strike B. We then reviewed his HEP. We spent a lot of time working on isolating his TA which he still cannot do although they are fairly strong.  OBJECTIVE IMPAIRMENTS: Abnormal gait, decreased activity tolerance, decreased endurance, difficulty walking, decreased ROM, decreased strength, impaired flexibility, postural dysfunction, and pain.   ACTIVITY LIMITATIONS: carrying, lifting, bending, standing, and locomotion level  PARTICIPATION LIMITATIONS: community activity and yard work  PERSONAL FACTORS: Time since onset of injury/illness/exacerbation and 1-2 comorbidities: Chronic lymphocytic leukemia, s/p L L4-5 transforaminal lumbar interbody fusion and decompression, with instrumentation and allograft on 05/17/2023, SIADH, HTN, Anxiety and Depression.  are also affecting patient's functional outcome.   REHAB POTENTIAL: Good  CLINICAL DECISION MAKING: Evolving/moderate complexity  EVALUATION COMPLEXITY: Moderate   GOALS: Goals reviewed with patient? Yes  SHORT TERM GOALS: Target date: 09/04/2023   Patient will be independent with initial HEP.  Baseline: given Goal status: INITIAL   LONG TERM GOALS: Target date: 10/16/2023   Patient will be independent with advanced/ongoing HEP to improve outcomes and carryover.  Baseline:  Goal status: INITIAL  2.  Patient demonstrate improved posture with gait.    Baseline: forward flexed posture with gait ~ 15 deg Goal status: INITIAL  3.  Patient will demonstrate improved hip extensor strength to 5/5.  Baseline: see  objective Goal status: INITIAL  4.  Patient will be able to lift 20lbs safely with good lifting technique to avoid lumbar strain to perform household ADLS. Baseline: has been under lifting restrictions.  Goal status: INITIAL  5.  Patient will report at least 6 points improvement on modified Oswestry to demonstrate improved functional ability.  Baseline: 6/50 Goal status: INITIAL   6.  Patient will be able to return to all activities without limitation from LBP. Baseline: has stopped fishing Goal status: INITIAL PLAN:  PT  FREQUENCY: 1-2x/week  PT DURATION: 8 weeks  PLANNED INTERVENTIONS: Therapeutic exercises, Therapeutic activity, Neuromuscular re-education, Balance training, Gait training, Patient/Family education, Self Care, Joint mobilization, Stair training, Orthotic/Fit training, Dry Needling, Electrical stimulation, Spinal manipulation, Spinal mobilization, Cryotherapy, Moist heat, Taping, Ultrasound, Ionotophoresis 4mg /ml Dexamethasone, Manual therapy, and Re-evaluation.  PLAN FOR NEXT SESSION: FGA, start core strengthening exerises.  Rocky Link Nayleen Janosik, PT  08/22/2023, 4:10 PM

## 2023-08-22 NOTE — Patient Instructions (Signed)
[name] , Thank you for taking time to come for your Medicare Wellness Visit. I appreciate your ongoing commitment to your health goals. Please review the following plan we discussed and let me know if I can assist you in the future.     This is a list of the screening recommended for you and due dates:  Health Maintenance  Topic Date Due   COVID-19 Vaccine (8 - 2023-24 season) 10/05/2023   Medicare Annual Wellness Visit  08/21/2024   Colon Cancer Screening  05/17/2026   DTaP/Tdap/Td vaccine (3 - Td or Tdap) 10/12/2029   Pneumonia Vaccine  Completed   Flu Shot  Completed   Hepatitis C Screening  Completed   Zoster (Shingles) Vaccine  Completed   HPV Vaccine  Aged Out    Next appointment: Follow up in one year for your annual wellness visit.   Preventive Care 74 Years and Older, Male Preventive care refers to lifestyle choices and visits with your health care provider that can promote health and wellness. What does preventive care include? A yearly physical exam. This is also called an annual well check. Dental exams once or twice a year. Routine eye exams. Ask your health care provider how often you should have your eyes checked. Personal lifestyle choices, including: Daily care of your teeth and gums. Regular physical activity. Eating a healthy diet. Avoiding tobacco and drug use. Limiting alcohol use. Practicing safe sex. Taking low doses of aspirin every day. Taking vitamin and mineral supplements as recommended by your health care provider. What happens during an annual well check? The services and screenings done by your health care provider during your annual well check will depend on your age, overall health, lifestyle risk factors, and family history of disease. Counseling  Your health care provider may ask you questions about your: Alcohol use. Tobacco use. Drug use. Emotional well-being. Home and relationship well-being. Sexual activity. Eating  habits. History of falls. Memory and ability to understand (cognition). Work and work Astronomer. Screening  You may have the following tests or measurements: Height, weight, and BMI. Blood pressure. Lipid and cholesterol levels. These may be checked every 5 years, or more frequently if you are over 57 years old. Skin check. Lung cancer screening. You may have this screening every year starting at age 33 if you have a 30-pack-year history of smoking and currently smoke or have quit within the past 15 years. Fecal occult blood test (FOBT) of the stool. You may have this test every year starting at age 56. Flexible sigmoidoscopy or colonoscopy. You may have a sigmoidoscopy every 5 years or a colonoscopy every 10 years starting at age 17. Prostate cancer screening. Recommendations will vary depending on your family history and other risks. Hepatitis C blood test. Hepatitis B blood test. Sexually transmitted disease (STD) testing. Diabetes screening. This is done by checking your blood sugar (glucose) after you have not eaten for a while (fasting). You may have this done every 1-3 years. Abdominal aortic aneurysm (AAA) screening. You may need this if you are a current or former smoker. Osteoporosis. You may be screened starting at age 27 if you are at high risk. Talk with your health care provider about your test results, treatment options, and if necessary, the need for more tests. Vaccines  Your health care provider may recommend certain vaccines, such as: Influenza vaccine. This is recommended every year. Tetanus, diphtheria, and acellular pertussis (Tdap, Td) vaccine. You may need a Td booster every 10 years.  Zoster vaccine. You may need this after age 28. Pneumococcal 13-valent conjugate (PCV13) vaccine. One dose is recommended after age 45. Pneumococcal polysaccharide (PPSV23) vaccine. One dose is recommended after age 34. Talk to your health care provider about which screenings and  vaccines you need and how often you need them. This information is not intended to replace advice given to you by your health care provider. Make sure you discuss any questions you have with your health care provider. Document Released: 12/03/2015 Document Revised: 07/26/2016 Document Reviewed: 09/07/2015 Elsevier Interactive Patient Education  2017 [employer].  Fall Prevention in the Home Falls can cause injuries. They can happen to people of all ages. There are many things you can do to make your home safe and to help prevent falls. What can I do on the outside of my home? Regularly fix the edges of walkways and driveways and fix any cracks. Remove anything that might make you trip as you walk through a door, such as a raised step or threshold. Trim any bushes or trees on the path to your home. Use bright outdoor lighting. Clear any walking paths of anything that might make someone trip, such as rocks or tools. Regularly check to see if handrails are loose or broken. Make sure that both sides of any steps have handrails. Any raised decks and porches should have guardrails on the edges. Have any leaves, snow, or ice cleared regularly. Use sand or salt on walking paths during winter. Clean up any spills in your garage right away. This includes oil or grease spills. What can I do in the bathroom? Use night lights. Install grab bars by the toilet and in the tub and shower. Do not use towel bars as grab bars. Use non-skid mats or decals in the tub or shower. If you need to sit down in the shower, use a plastic, non-slip stool. Keep the floor dry. Clean up any water that spills on the floor as soon as it happens. Remove soap buildup in the tub or shower regularly. Attach bath mats securely with double-sided non-slip rug tape. Do not have throw rugs and other things on the floor that can make you trip. What can I do in the bedroom? Use night lights. Make sure that you have a light by your  bed that is easy to reach. Do not use any sheets or blankets that are too big for your bed. They should not hang down onto the floor. Have a firm chair that has side arms. You can use this for support while you get dressed. Do not have throw rugs and other things on the floor that can make you trip. What can I do in the kitchen? Clean up any spills right away. Avoid walking on wet floors. Keep items that you use a lot in easy-to-reach places. If you need to reach something above you, use a strong step stool that has a grab bar. Keep electrical cords out of the way. Do not use floor polish or wax that makes floors slippery. If you must use wax, use non-skid floor wax. Do not have throw rugs and other things on the floor that can make you trip. What can I do with my stairs? Do not leave any items on the stairs. Make sure that there are handrails on both sides of the stairs and use them. Fix handrails that are broken or loose. Make sure that handrails are as long as the stairways. Check any carpeting to make sure that  it is firmly attached to the stairs. Fix any carpet that is loose or worn. Avoid having throw rugs at the top or bottom of the stairs. If you do have throw rugs, attach them to the floor with carpet tape. Make sure that you have a light switch at the top of the stairs and the bottom of the stairs. If you do not have them, ask someone to add them for you. What else can I do to help prevent falls? Wear shoes that: Do not have high heels. Have rubber bottoms. Are comfortable and fit you well. Are closed at the toe. Do not wear sandals. If you use a stepladder: Make sure that it is fully opened. Do not climb a closed stepladder. Make sure that both sides of the stepladder are locked into place. Ask someone to hold it for you, if possible. Clearly mark and make sure that you can see: Any grab bars or handrails. First and last steps. Where the edge of each step is. Use tools that  help you move around (mobility aids) if they are needed. These include: Canes. Walkers. Scooters. Crutches. Turn on the lights when you go into a dark area. Replace any light bulbs as soon as they burn out. Set up your furniture so you have a clear path. Avoid moving your furniture around. If any of your floors are uneven, fix them. If there are any pets around you, be aware of where they are. Review your medicines with your doctor. Some medicines can make you feel dizzy. This can increase your chance of falling. Ask your doctor what other things that you can do to help prevent falls. This information is not intended to replace advice given to you by your health care provider. Make sure you discuss any questions you have with your health care provider. Document Released: 09/02/2009 Document Revised: 04/13/2016 Document Reviewed: 12/11/2014 Elsevier Interactive Patient Education  2017 [employer].

## 2023-08-22 NOTE — Telephone Encounter (Signed)
Requesting: clonazepam 0.5mg   Contract:02/21/23 UDS:02/21/23 Last Visit: 02/21/23 Next Visit:02/27/24 Last Refill: 03/12/23 #60 and 3RF   Please Advise

## 2023-08-22 NOTE — Progress Notes (Signed)
Subjective:   Bobby [name] is a 72 y.o. male who presents for Medicare Annual/Subsequent preventive examination.  Visit Complete: Virtual  I connected with  Bobby [name] on 08/22/23 by a audio enabled telemedicine application and verified that I am speaking with the correct person using two identifiers.  Patient Location: Home  Provider Location: Office/Clinic  I discussed the limitations of evaluation and management by telemedicine. The patient expressed understanding and agreed to proceed.  Cardiac Risk Factors include: advanced age (>73men, >15 women);male gender;hypertension     Objective:    Because this visit was a virtual/telehealth visit, some criteria may be missing or patient reported. Any vitals not documented were not able to be obtained and vitals that have been documented are patient reported.       08/22/2023    9:05 AM 08/21/2023    8:52 AM 08/15/2023   12:28 PM 05/17/2023    6:44 AM 04/25/2023   11:20 AM 03/27/2023    5:19 PM 01/23/2023    9:36 AM  Advanced Directives  Does Patient Have a Medical Advance Directive? Yes Yes Yes Yes Yes No Yes  Type of Estate agent of [place];Living will Healthcare Power of [place];Living will Healthcare Power of [place];Living will Healthcare Power of [place];Living will Living will;Healthcare Power of [employer] Power of [place];Living will  Does patient want to make changes to medical advance directive? No - Patient declined    No - Patient declined  No - Patient declined  Copy of Healthcare Power of Attorney in Chart? No - copy requested      No - copy requested  Would patient like information on creating a medical advance directive?      No - Patient declined     Current Medications (verified) Outpatient Encounter Medications as of 08/22/2023  Medication Sig   atorvastatin (LIPITOR) 20 MG tablet Take 1 tablet (20 mg total) by mouth at bedtime.   b complex vitamins capsule Take 1  capsule by mouth daily.   carvedilol (COREG) 6.25 MG tablet Take 1 tablet (6.25 mg total) by mouth 2 (two) times daily with a meal.   cholecalciferol (VITAMIN D3) 25 MCG (1000 UNIT) tablet Take 1,000 Units by mouth daily.   clonazePAM (KLONOPIN) 0.5 MG tablet TAKE ONE TABLET BY MOUTH TWICE A DAY AS NEEDED FOR ANXIETY   DUPIXENT 300 MG/2ML SOPN Inject into the skin every 14 (fourteen) days.   folic acid (FOLVITE) 1 MG tablet Take 1 tablet (1 mg total) by mouth daily.   Multiple Vitamin (MULTIVITAMIN WITH MINERALS) TABS Take 1 tablet by mouth daily.   tadalafil (CIALIS) 10 MG tablet TAKE ONE TO TWO TABLETS BY MOUTH EVERY OTHER DAY AS NEEDED   zolpidem (AMBIEN) 10 MG tablet TAKE ONE TABLET BY MOUTH EVERY NIGHT AT BEDTIME AS NEEDED FOR SLEEP   No facility-administered encounter medications on file as of 08/22/2023.    Allergies (verified) Tramadol   History: Past Medical History:  Diagnosis Date   Anxiety and depression    Hypertension    Hyponatremia 03/2013   ongoing, seeing Dr. Allena Katz   Insomnia    Melanoma in situ of back [facility])    biopsy on 03/05/2020   SIADH (syndrome of inappropriate ADH production) (HCC) 2017   Dr. Allena Katz   Squamous cell carcinoma in situ    Past Surgical History:  Procedure Laterality Date   BRONCHIAL BIOPSY  11/27/2022   Procedure: BRONCHIAL BIOPSIES;  Surgeon: Leslye Peer, MD;  Location: [name];  Service: Cardiopulmonary;;   BRONCHIAL BRUSHINGS  11/27/2022   Procedure: BRONCHIAL BRUSHINGS;  Surgeon: Leslye Peer, MD;  [facility] ENDOSCOPY;  Service: Cardiopulmonary;;   BRONCHIAL WASHINGS  11/27/2022   Procedure: BRONCHIAL WASHINGS;  Surgeon: Leslye Peer, MD;  Location: [name];  Service: Cardiopulmonary;;   MOHS SURGERY  ~ 05-12-20   face, [facility]   SHOULDER SURGERY  1980s   right shoulder   TRANSFORAMINAL LUMBAR INTERBODY FUSION (TLIF) WITH PEDICLE SCREW FIXATION 1 LEVEL Left 05/17/2023   Procedure: LEFT-SIDED LUMBAR 4- LUMBAR 5 TRANSFORAMINAL  LUMBAR INTERBODY FUSION AND DECOMPRESSION WITH INSTRUMENTATION AND ALLOGRAFT;  Surgeon: Estill Bamberg, MD;  Location: [facility] OR;  Service: Orthopedics;  Laterality: Left;   VIDEO BRONCHOSCOPY N/A 11/27/2022   Procedure: VIDEO BRONCHOSCOPY WITH FLUORO;  Surgeon: Leslye Peer, MD;  Location: [facility] ENDOSCOPY;  Service: Cardiopulmonary;  Laterality: N/A;   Family History  Problem Relation Age of Onset   Hypertension Mother    Stroke Mother    Diabetes Mother    Emphysema Mother        smoked   Thyroid disease Mother    Alcoholism Father    Colon cancer Neg Hx    Prostate cancer Neg Hx    CAD Neg Hx    Social History   Socioeconomic History   Marital status: Married    Spouse name: Not on file   Number of children: 2   Years of education: 16   Highest education level: Bachelor's degree (e.g., BA, AB, BS)  Occupational History   Occupation: semi-retired--business owner   Tobacco Use   Smoking status: Never   Smokeless tobacco: Never  Vaping Use   Vaping status: Never Used  Substance and Sexual Activity   Alcohol use: Yes    Comment: 1-2 qd   Drug use: No   Sexual activity: Yes  Other Topics Concern   Not on file  Social History Narrative   "[name]", married    2 children   mom passed away 05/12/08  Right-handed.   2-3 cups coffee per day.   Lives at home with wife.            Social Determinants of Health   Financial Resource Strain: Low Risk  (08/22/2023)   Overall Financial Resource Strain (CARDIA)    Difficulty of Paying Living Expenses: Not hard at all  Food Insecurity: No Food Insecurity (08/22/2023)   Hunger Vital Sign    Worried About Running Out of Food in the Last Year: Never true    Ran Out of Food in the Last Year: Never true  Transportation Needs: No Transportation Needs (08/22/2023)   PRAPARE - Administrator, Civil Service (Medical): No    Lack of Transportation (Non-Medical): No  Physical Activity: Unknown (08/22/2023)   Exercise Vital Sign     Days of Exercise per Week: 4 days    Minutes of Exercise per Session: Patient declined  Stress: No Stress Concern Present (08/22/2023)   [employer] of Occupational Health - Occupational Stress Questionnaire    Feeling of Stress : Not at all  Social Connections: Unknown (08/22/2023)   Social Connection and Isolation Panel [NHANES]    Frequency of Communication with Friends and Family: More than three times a week    Frequency of Social Gatherings with Friends and Family: Three times a week    Attends Religious Services: Not on file    Active Member of Clubs or Organizations:  Patient declined    Attends Banker Meetings: Patient declined    Marital Status: Married    Tobacco Counseling Counseling given: Not Answered   Clinical Intake:  Pre-visit preparation completed: Yes  Pain : No/denies pain   Nutritional Risks: None Diabetes: No  How often do you need to have someone help you when you read instructions, pamphlets, or other written materials from your doctor or pharmacy?: 1 - Never  Interpreter Needed?: No  Information entered by :: Donne Anon, CMA   Activities of Daily Living    08/22/2023    9:05 AM 08/22/2023    8:59 AM  In your present state of health, do you have any difficulty performing the following activities:  Hearing? 0 0  Vision? 0 0  Difficulty concentrating or making decisions? 0 0  Walking or climbing stairs? 0 0  Dressing or bathing? 0 0  Doing errands, shopping? 0 0  Preparing Food and eating ? N N  Using the Toilet? N N  In the past six months, have you accidently leaked urine? N N  Do you have problems with loss of bowel control? N N  Managing your Medications? N N  Managing your Finances? N N  Housekeeping or managing your Housekeeping? N N    Patient Care Team: Wanda Plump, MD as PCP - General Sherene Sires Charlaine Dalton, MD as Consulting Physician (Pulmonary Disease) Dagoberto Ligas, MD as Consulting Physician  (Nephrology) Reather Littler, MD (Inactive) as Consulting Physician (Endocrinology) Vida Rigger, MD as Consulting Physician (Gastroenterology)  Indicate any recent Medical Services you may have received from other than [facility] providers in the past year (date may be approximate).     Assessment:   This is a routine wellness examination for [place].  Hearing/Vision screen No results found.   Goals Addressed   None    Depression Screen    08/22/2023    9:07 AM 02/21/2023   10:11 AM 08/16/2022    1:50 PM 11/02/2021   10:44 AM 08/10/2021   10:36 AM 05/04/2021    9:23 AM 05/12/2020   10:25 AM  PHQ 2/9 Scores  PHQ - 2 Score 0 0 0 0 0 0 0  PHQ- 9 Score       0    Fall Risk    08/22/2023    8:59 AM 02/21/2023   10:11 AM 08/16/2022    1:49 PM 11/02/2021   10:44 AM 08/10/2021   10:35 AM  Fall Risk   Falls in the past year? 0 0 0 0 0  Number falls in past yr: 0 0 0 0 0  Injury with Fall? 0 0 0 0 0  Risk for fall due to : No Fall Risks  No Fall Risks    Follow up Falls evaluation completed Falls evaluation completed Falls evaluation completed Falls evaluation completed Falls prevention discussed    MEDICARE RISK AT HOME: Medicare Risk at Home Any stairs in or around the home?: Yes If so, are there any without handrails?: No Home free of loose throw rugs in walkways, pet beds, electrical cords, etc?: Yes Adequate lighting in your home to reduce risk of falls?: Yes Life alert?: No Use of a cane, walker or w/c?: No Grab bars in the bathroom?: Yes Shower chair or bench in shower?: No Elevated toilet seat or a handicapped toilet?: No  TIMED UP AND GO:  Was the test performed?  No    Cognitive Function:    12/24/2017  8:00 AM  MMSE - Mini Mental State Exam  Orientation to time 5  Orientation to Place 5  Registration 3  Attention/ Calculation 5  Recall 2  Language- name 2 objects 2  Language- repeat 1  Language- follow 3 step command 3  Language- read & follow direction 1   Write a sentence 1  Copy design 1  Total score 29        08/22/2023    9:08 AM 08/16/2022    1:59 PM  6CIT Screen  What Year? 0 points 0 points  What month? 0 points 0 points  What time? 0 points 0 points  Count back from 20 0 points 0 points  Months in reverse 0 points 0 points  Repeat phrase 0 points 0 points  Total Score 0 points 0 points    Immunizations Immunization History  Administered Date(s) Administered   Fluad Trivalent(High Dose 65+) 08/10/2023   Influenza Split 08/11/2021   Influenza, High Dose Seasonal PF 09/09/2013, 11/06/2016, 08/21/2017, 10/30/2018, 10/13/2019   Influenza-Unspecified 09/16/2014, 09/10/2015, 09/03/2020, 09/21/2022   PFIZER(Purple Top)SARS-COV-2 Vaccination 12/13/2019, 01/03/2020, 09/03/2020, 03/14/2021   Pfizer Covid-19 Vaccine Bivalent Booster 26yrs & up 08/17/2021   Pfizer(Comirnaty)Fall Seasonal Vaccine 12 years and older 10/05/2022, 08/10/2023   Pneumococcal Conjugate-13 05/13/2015   Pneumococcal Polysaccharide-23 05/04/2014   RSV,unspecified 09/21/2022   Td 01/29/2009   Tdap 10/13/2019   Zoster Recombinant(Shingrix) 01/01/2017, 03/01/2017   Zoster, Live 03/17/2013    TDAP status: Up to date  Flu Vaccine status: Up to date  Pneumococcal vaccine status: Up to date  Covid-19 vaccine status: Completed vaccines  Qualifies for Shingles Vaccine? Yes   Zostavax completed Yes   Shingrix Completed?: Yes  Screening Tests Health Maintenance  Topic Date Due   Medicare Annual Wellness (AWV)  08/17/2023   COVID-19 Vaccine (8 - 2023-24 season) 10/05/2023   Colonoscopy  05/17/2026   DTaP/Tdap/Td (3 - Td or Tdap) 10/12/2029   Pneumonia Vaccine 68+ Years old  Completed   INFLUENZA VACCINE  Completed   Hepatitis C Screening  Completed   Zoster Vaccines- Shingrix  Completed   HPV VACCINES  Aged Out    Health Maintenance  Health Maintenance Due  Topic Date Due   Medicare Annual Wellness (AWV)  08/17/2023    Colorectal cancer  screening: Type of screening: Colonoscopy. Completed 05/17/21. Repeat every 5 years  Lung Cancer Screening: (Low Dose CT Chest recommended if Age 71-80 years, 20 pack-year currently smoking OR have quit w/in 15years.) does not qualify.   Additional Screening:  Hepatitis C Screening: does qualify; Completed 08/21/17  Vision Screening: Recommended annual ophthalmology exams for early detection of glaucoma and other disorders of the eye. Is the patient up to date with their annual eye exam?  Yes  Who is the provider or what is the name of the office in which the patient attends annual eye exams? Dr. Blima Ledger If pt is not established with a provider, would they like to be referred to a provider to establish care? No .   Dental Screening: Recommended annual dental exams for proper oral hygiene  Diabetic Foot Exam: N/a  Community Resource Referral / Chronic Care Management: CRR required this visit?  No   CCM required this visit?  No     Plan:     I have personally reviewed and noted the following in the patient's chart:   Medical and social history Use of alcohol, tobacco or illicit drugs  Current medications and supplements including opioid prescriptions. Patient  is not currently taking opioid prescriptions. Functional ability and status Nutritional status Physical activity Advanced directives List of other physicians Hospitalizations, surgeries, and ER visits in previous 12 months Vitals Screenings to include cognitive, depression, and falls Referrals and appointments  In addition, I have reviewed and discussed with patient certain preventive protocols, quality metrics, and best practice recommendations. A written personalized care plan for preventive services as well as general preventive health recommendations were provided to patient.     Donne Anon, CMA   08/22/2023   After Visit Summary: (MyChart) Due to this being a telephonic visit, the after visit summary with  patients personalized plan was offered to patient via MyChart   Nurse Notes: None

## 2023-08-28 ENCOUNTER — Encounter: Payer: Self-pay | Admitting: Physical Therapy

## 2023-08-28 ENCOUNTER — Ambulatory Visit: Payer: Medicare HMO | Admitting: Physical Therapy

## 2023-08-28 DIAGNOSIS — M5459 Other low back pain: Secondary | ICD-10-CM

## 2023-08-28 DIAGNOSIS — M6281 Muscle weakness (generalized): Secondary | ICD-10-CM

## 2023-08-28 DIAGNOSIS — R2689 Other abnormalities of gait and mobility: Secondary | ICD-10-CM

## 2023-08-28 DIAGNOSIS — M48062 Spinal stenosis, lumbar region with neurogenic claudication: Secondary | ICD-10-CM

## 2023-08-28 NOTE — Therapy (Signed)
OUTPATIENT PHYSICAL THERAPY TREATMENT   Patient Name: Bobby [name] MRN: [id number] DOB:[birth year], 72 y.o., male Today's Date: 08/28/2023  END OF SESSION:  PT End of Session - 08/28/23 1451     Visit Number 3    Date for PT Re-Evaluation 10/16/23    Authorization Type [employer] MCR    Progress Note Due on Visit 10    PT Start Time 1448    PT Stop Time 1532    PT Time Calculation (min) 44 min    Activity Tolerance Patient tolerated treatment well    Behavior During Therapy [facility] for tasks assessed/performed             Past Medical History:  Diagnosis Date   Anxiety and depression    Hypertension    Hyponatremia 03/2013   ongoing, seeing Dr. Allena Katz   Insomnia    Melanoma in situ of back [facility])    biopsy on 03/05/2020   SIADH (syndrome of inappropriate ADH production) (HCC) 2017   Dr. Allena Katz   Squamous cell carcinoma in situ    Past Surgical History:  Procedure Laterality Date   BRONCHIAL BIOPSY  11/27/2022   Procedure: BRONCHIAL BIOPSIES;  Surgeon: Leslye Peer, MD;  Location: [facility] ENDOSCOPY;  Service: Cardiopulmonary;;   BRONCHIAL BRUSHINGS  11/27/2022   Procedure: BRONCHIAL BRUSHINGS;  Surgeon: Leslye Peer, MD;  Location: [facility] ENDOSCOPY;  Service: Cardiopulmonary;;   BRONCHIAL WASHINGS  11/27/2022   Procedure: BRONCHIAL WASHINGS;  Surgeon: Leslye Peer, MD;  Location: [name];  Service: Cardiopulmonary;;   MOHS SURGERY  ~ 04/2020   face, [facility]   SHOULDER SURGERY  1980s   right shoulder   TRANSFORAMINAL LUMBAR INTERBODY FUSION (TLIF) WITH PEDICLE SCREW FIXATION 1 LEVEL Left 05/17/2023   Procedure: LEFT-SIDED LUMBAR 4- LUMBAR 5 TRANSFORAMINAL LUMBAR INTERBODY FUSION AND DECOMPRESSION WITH INSTRUMENTATION AND ALLOGRAFT;  Surgeon: Estill Bamberg, MD;  Location: [facility] OR;  Service: Orthopedics;  Laterality: Left;   VIDEO BRONCHOSCOPY N/A 11/27/2022   Procedure: VIDEO BRONCHOSCOPY WITH FLUORO;  Surgeon: Leslye Peer, MD;  Location: [facility] ENDOSCOPY;  Service: Cardiopulmonary;   Laterality: N/A;   Patient Active Problem List   Diagnosis Date Noted   Radiculopathy of lumbar region 05/17/2023   CLL (chronic lymphocytic leukemia) (HCC) 02/21/2023   Abnormal CT of the chest 11/01/2022   Melanoma in situ of back (HCC) 03/16/2020   Squamous cell carcinoma in situ 08/21/2019   Vitamin D deficiency 05/29/2019   Cough 01/01/2016   PCP NOTES >>>> 09/28/2015   Hypogonadism in male 05/13/2015   SIADH (syndrome of inappropriate ADH production) (HCC) 04/13/2013   Memory problem 04/13/2013   Annual physical exam 03/06/2012   Idiopathic scoliosis and kyphoscoliosis 08/18/2010   BACK PAIN, LUMBAR 07/21/2010   Anxiety state 04/01/2009   Essential hypertension, benign 01/04/2009   INSOMNIA-SLEEP DISORDER-UNSPEC 09/08/2008    PCP: Wanda Plump, MD   REFERRING PROVIDER: Estill Bamberg, MD   REFERRING DIAG: S32.010A (ICD-10-CM) - Wedge compression fracture of first lumbar vertebra, initial encounter for closed fracture   Rationale for Evaluation and Treatment: Rehabilitation  THERAPY DIAG:  Other low back pain  Other abnormalities of gait and mobility  Muscle weakness (generalized)  Spinal stenosis of lumbar region with neurogenic claudication  ONSET DATE: s/p L L4-5 transforaminal lumbar interbody fusion and decompression, with instrumentation and allograft on 05/17/2023  SUBJECTIVE:  SUBJECTIVE STATEMENT: Didn't do HEP because lost his sheet, but then found it this morning.  No pain, just a feeling in his low back.   Eval: Patient had surgery about 12 weeks ago. Had to wear TLSO for 6 weeks, then x-rays, then just had to wear the LSO, now does not have to wear any brace.  Did not have any PT during that time.  He had weight restrictions etc, to make sure healed, has one more follow-up  for X-rays, but then should be released.  He reports that the pain disappeared right after surgery.  Surgeon wants him to walk more.  He had previous PT to strengthen back prior to surgery.    PERTINENT HISTORY:  Chronic lymphocytic leukemia, s/p L L4-5 transforaminal lumbar interbody fusion and decompression, with instrumentation and allograft on 05/17/2023, SIADH, HTN, Anxiety and Depression.  PAIN:  Are you having pain? Yes: NPRS scale: 0/10 Pain location: low back Pain description: just a feeling, not really pain Aggravating factors: sitting too much Relieving factors: walking  PRECAUTIONS: Back  no lifting more than ~ 20lbs   RED FLAGS: None   WEIGHT BEARING RESTRICTIONS: No  FALLS:  Has patient fallen in last 6 months? No  LIVING ENVIRONMENT: Lives with: lives with their spouse Lives in: House/apartment Stairs: Yes: Internal: 11 steps; can reach both and External: 5 steps; on right going up Has following equipment at home: None  OCCUPATION: works at desk, on computer most of the day  PLOF: Independent; likes fly fishing  PATIENT GOALS: get back muscles stronger, walk more, get back to fly fishing and biking  NEXT MD VISIT: beginning of November  OBJECTIVE:   DIAGNOSTIC FINDINGS:  Result Date: 05/17/2023 CLINICAL DATA:  Elective surgery. EXAM: LUMBAR SPINE - 2-3 VIEW COMPARISON:  None Available. FINDINGS: Two fluoroscopic spot views of the lumbar spine obtained in the operating room in frontal and lateral projections. Posterior rod with intrapedicular screw fusion and interbody spacer at L4-L5. Fluoroscopy time 46 seconds. Dose 28.96 mGy. IMPRESSION: Intraoperative fluoroscopy during L4-L5 fusion  PATIENT SURVEYS:  Modified Oswestry 6/50   COGNITION: Overall cognitive status: Within functional limits for tasks assessed     SENSATION: WFL  MUSCLE LENGTH: Hamstrings: Right 140 deg; Left 130 deg  POSTURE: flexed trunk ~ 15 deg walking   LUMBAR ROM:   AROM  eval  Flexion 85 tight hamstrings  Extension 30   Right lateral flexion To knees  Left lateral flexion To knees  Right rotation WNL  Left rotation WNL   (Blank rows = not tested)  LOWER EXTREMITY ROM:   very good hip mobility for both ER and IR, symmetric and pain free   LOWER EXTREMITY MMT:    MMT Right eval Left eval  Hip flexion 5 5  Hip extension 4+ 4+  Hip abduction 5 5  Hip adduction 5 5  Knee flexion 5 5  Knee extension 5 5  Ankle dorsiflexion 5 5  Ankle plantarflexion 5 5   (Blank rows = not tested)  FUNCTIONAL TESTS:  6 minute walk test: 1025' Functional gait assessment: 22/30  GAIT: Distance walked: 75' Assistive device utilized: None Level of assistance: Complete Independence Comments: maintains forward flexion   TODAY'S TREATMENT:  DATE:  08/28/2023 Therapeutic Exercise: to improve strength and mobility.  Demo, verbal and tactile cues throughout for technique. Bike L2 x 6 min  PPT x 10  Bridges 2  x 10  SLR 2 x 10 bil - noted decreased all back pain Bent knee fall outs with TrA x 10 each  Therapeutic Activity:   FGA 22/30    08/22/23 - 1025 ft for warm up - cues to stand upright and increase heel strike Sit to stand x 10 Gastroc stretch 2x 30 sec B Post pelvic tilt 5 sec x 10 Bridge x 10 Supine SLR x 10 B cues to keep quads engaged (L difficult) Dynamic HS stretch x 12 B Worked on TA contraction and trying to isolate from general abs. Used various cues including pelvic floor. Patient able to engage but has difficulty isolating.  08/21/23 Self Care: Findings, POC, initial HEP Bridges x 10 - discomfort, after cues for TrA bracing - no pain Dynamic hamstring stretches x 10 each side Supine SLR x 10  SLR x 10 bil      PATIENT EDUCATION:  Education details: HEP review Person educated: Patient Education method:  Programmer, multimedia, Demonstration, Verbal cues, and Handouts Education comprehension: verbalized understanding and returned demonstration  HOME EXERCISE PROGRAM: Access Code: [email or web address] URL: [email or web address] Date: 08/28/2023 Prepared by: Harrie Foreman  Exercises - Supine Posterior Pelvic Tilt  - 1-2 x daily - 7 x weekly - 2 sets - 10 reps - 5 sec hold - Supine Bridge  - 1 x daily - 7 x weekly - 2 sets - 10 reps - Supine Active Straight Leg Raise  - 1 x daily - 7 x weekly - 2 sets - 10 reps - Bent Knee Fallouts  - 1 x daily - 7 x weekly - 2 sets - 10 reps - Supine Hamstring Stretch  - 1 x daily - 7 x weekly - 2 sets - 10 reps - Bird Dog Progression  - 1 x daily - 7 x weekly - 2 sets - 10 reps - Standing Hamstring Curl with Resistance  - 1 x daily - 7 x weekly - 2 sets - 10 reps - Standing Hip Abduction with Resistance at Thighs  - 1 x daily - 7 x weekly - 2 sets - 10 reps - 3 sec hold - Standing Hip Extension with Counter Support  - 1 x daily - 7 x weekly - 2 sets - 10 reps  ASSESSMENT:  CLINICAL IMPRESSION: BRADELY PESCH reported no pain per se but some feeling in low back.  Noted improvement in posture already.  Performed FGA, scored 22/30 demonstrating medium fall risk.  Most challenged with tandem walking which he was unable to do at all.  Continued to review HEP, still challenged with TrA contraction but improving.  He reported that feeling in low back was abolished with R SLR.  Updated HEP, but then merged with previous exercise program and texted so could use app again to help keep up with exercises.  [name] continues to demonstrate potential for improvement and would benefit from continued skilled therapy to address impairments.     OBJECTIVE IMPAIRMENTS: Abnormal gait, decreased activity tolerance, decreased endurance, difficulty walking, decreased ROM, decreased strength, impaired flexibility, postural dysfunction, and pain.   ACTIVITY LIMITATIONS:  carrying, lifting, bending, standing, and locomotion level  PARTICIPATION LIMITATIONS: community activity and yard work  PERSONAL FACTORS: Time since onset of injury/illness/exacerbation and 1-2 comorbidities: Chronic lymphocytic leukemia, s/p L L4-5 transforaminal  lumbar interbody fusion and decompression, with instrumentation and allograft on 05/17/2023, SIADH, HTN, Anxiety and Depression.  are also affecting patient's functional outcome.   REHAB POTENTIAL: Good  CLINICAL DECISION MAKING: Evolving/moderate complexity  EVALUATION COMPLEXITY: Moderate   GOALS: Goals reviewed with patient? Yes  SHORT TERM GOALS: Target date: 09/04/2023   Patient will be independent with initial HEP.  Baseline: given Goal status: IN PROGRESS   LONG TERM GOALS: Target date: 10/16/2023   Patient will be independent with advanced/ongoing HEP to improve outcomes and carryover.  Baseline:  Goal status: IN PROGRESS 08/28/23- reviewed  2.  Patient demonstrate improved posture with gait.    Baseline: forward flexed posture with gait ~ 15 deg Goal status: IN PROGRESS  3.  Patient will demonstrate improved hip extensor strength to 5/5.  Baseline: see objective Goal status: IN PROGRESS  4.  Patient will be able to lift 20lbs safely with good lifting technique to avoid lumbar strain to perform household ADLS. Baseline: has been under lifting restrictions.  Goal status: IN PROGRESS  5.  Patient will report at least 6 points improvement on modified Oswestry to demonstrate improved functional ability.  Baseline: 6/50 Goal status: IN PROGRESS   6.  Patient will be able to return to all activities without limitation from LBP. Baseline: has stopped fishing Goal status: IN PROGRESS PLAN:  PT FREQUENCY: 1-2x/week  PT DURATION: 8 weeks  PLANNED INTERVENTIONS: Therapeutic exercises, Therapeutic activity, Neuromuscular re-education, Balance training, Gait training, Patient/Family education, Self Care, Joint  mobilization, Stair training, Orthotic/Fit training, Dry Needling, Electrical stimulation, Spinal manipulation, Spinal mobilization, Cryotherapy, Moist heat, Taping, Ultrasound, Ionotophoresis 4mg /ml Dexamethasone, Manual therapy, and Re-evaluation.  PLAN FOR NEXT SESSION: Continue core strengthening exerises.     Jena Gauss, PT, DPT  08/28/2023, 5:37 PM

## 2023-08-30 ENCOUNTER — Other Ambulatory Visit: Payer: Self-pay

## 2023-08-30 ENCOUNTER — Ambulatory Visit: Payer: Medicare HMO

## 2023-08-30 DIAGNOSIS — M48062 Spinal stenosis, lumbar region with neurogenic claudication: Secondary | ICD-10-CM

## 2023-08-30 DIAGNOSIS — M6281 Muscle weakness (generalized): Secondary | ICD-10-CM

## 2023-08-30 DIAGNOSIS — R2689 Other abnormalities of gait and mobility: Secondary | ICD-10-CM

## 2023-08-30 DIAGNOSIS — M5459 Other low back pain: Secondary | ICD-10-CM

## 2023-08-30 NOTE — Therapy (Signed)
OUTPATIENT PHYSICAL THERAPY TREATMENT   Patient Name: Bobby [name] MRN: [id number] DOB:1951-07-15, 72 y.o., male Today's Date: 08/30/2023  END OF SESSION:  PT End of Session - 08/30/23 1312     Visit Number 4    Date for PT Re-Evaluation 10/16/23    Authorization Type [employer] MCR    Progress Note Due on Visit 10    PT Start Time 0930    PT Stop Time 1015    PT Time Calculation (min) 45 min    Activity Tolerance Patient tolerated treatment well    Behavior During Therapy [facility] for tasks assessed/performed              Past Medical History:  Diagnosis Date   Anxiety and depression    Hypertension    Hyponatremia 03/2013   ongoing, seeing Dr. Allena Katz   Insomnia    Melanoma in situ of back [facility])    biopsy on 03/05/2020   SIADH (syndrome of inappropriate ADH production) (HCC) 2017   Dr. Allena Katz   Squamous cell carcinoma in situ    Past Surgical History:  Procedure Laterality Date   BRONCHIAL BIOPSY  11/27/2022   Procedure: BRONCHIAL BIOPSIES;  Surgeon: Leslye Peer, MD;  Location: [facility] ENDOSCOPY;  Service: Cardiopulmonary;;   BRONCHIAL BRUSHINGS  11/27/2022   Procedure: BRONCHIAL BRUSHINGS;  Surgeon: Leslye Peer, MD;  Location[facility] ENDOSCOPY;  Service: Cardiopulmonary;;   BRONCHIAL WASHINGS  11/27/2022   Procedure: BRONCHIAL WASHINGS;  Surgeon: Leslye Peer, MD;  Location: [name];  Service: Cardiopulmonary;;   MOHS SURGERY  ~ 04/2020   face, [facility]   SHOULDER SURGERY  1980s   right shoulder   TRANSFORAMINAL LUMBAR INTERBODY FUSION (TLIF) WITH PEDICLE SCREW FIXATION 1 LEVEL Left 05/17/2023   Procedure: LEFT-SIDED LUMBAR 4- LUMBAR 5 TRANSFORAMINAL LUMBAR INTERBODY FUSION AND DECOMPRESSION WITH INSTRUMENTATION AND ALLOGRAFT;  Surgeon: Estill Bamberg, MD;  Location: [facility] OR;  Service: Orthopedics;  Laterality: Left;   VIDEO BRONCHOSCOPY N/A 11/27/2022   Procedure: VIDEO BRONCHOSCOPY WITH FLUORO;  Surgeon: Leslye Peer, MD;  Location: [facility] ENDOSCOPY;  Service:  Cardiopulmonary;  Laterality: N/A;   Patient Active Problem List   Diagnosis Date Noted   Radiculopathy of lumbar region 05/17/2023   CLL (chronic lymphocytic leukemia) (HCC) 02/21/2023   Abnormal CT of the chest 11/01/2022   Melanoma in situ of back (HCC) 03/16/2020   Squamous cell carcinoma in situ 08/21/2019   Vitamin D deficiency 05/29/2019   Cough 01/01/2016   PCP NOTES >>>> 09/28/2015   Hypogonadism in male 05/13/2015   SIADH (syndrome of inappropriate ADH production) (HCC) 04/13/2013   Memory problem 04/13/2013   Annual physical exam 03/06/2012   Idiopathic scoliosis and kyphoscoliosis 08/18/2010   BACK PAIN, LUMBAR 07/21/2010   Anxiety state 04/01/2009   Essential hypertension, benign 01/04/2009   INSOMNIA-SLEEP DISORDER-UNSPEC 09/08/2008    PCP: Wanda Plump, MD   REFERRING PROVIDER: Estill Bamberg, MD   REFERRING DIAG: S32.010A (ICD-10-CM) - Wedge compression fracture of first lumbar vertebra, initial encounter for closed fracture   Rationale for Evaluation and Treatment: Rehabilitation  THERAPY DIAG:  Other low back pain  Other abnormalities of gait and mobility  Muscle weakness (generalized)  Spinal stenosis of lumbar region with neurogenic claudication  ONSET DATE: s/p L L4-5 transforaminal lumbar interbody fusion and decompression, with instrumentation and allograft on 05/17/2023  SUBJECTIVE:  SUBJECTIVE STATEMENT:  walking approximately 1.5 mi/ day but in increments , no pain today  no new complaints  Eval: Patient had surgery about 12 weeks ago. Had to wear TLSO for 6 weeks, then x-rays, then just had to wear the LSO, now does not have to wear any brace.  Did not have any PT during that time.  He had weight restrictions etc, to make sure healed, has one more follow-up for  X-rays, but then should be released.  He reports that the pain disappeared right after surgery.  Surgeon wants him to walk more.  He had previous PT to strengthen back prior to surgery.    PERTINENT HISTORY:  Chronic lymphocytic leukemia, s/p L L4-5 transforaminal lumbar interbody fusion and decompression, with instrumentation and allograft on 05/17/2023, SIADH, HTN, Anxiety and Depression.  PAIN:  Are you having pain? Yes: NPRS scale: 0/10 Pain location: low back Pain description: just a feeling, not really pain Aggravating factors: sitting too much Relieving factors: walking  PRECAUTIONS: Back  no lifting more than ~ 20lbs   RED FLAGS: None   WEIGHT BEARING RESTRICTIONS: No  FALLS:  Has patient fallen in last 6 months? No  LIVING ENVIRONMENT: Lives with: lives with their spouse Lives in: House/apartment Stairs: Yes: Internal: 11 steps; can reach both and External: 5 steps; on right going up Has following equipment at home: None  OCCUPATION: works at desk, on computer most of the day  PLOF: Independent; likes fly fishing  PATIENT GOALS: get back muscles stronger, walk more, get back to fly fishing and biking  NEXT MD VISIT: beginning of November  OBJECTIVE:   DIAGNOSTIC FINDINGS:  Result Date: 05/17/2023 CLINICAL DATA:  Elective surgery. EXAM: LUMBAR SPINE - 2-3 VIEW COMPARISON:  None Available. FINDINGS: Two fluoroscopic spot views of the lumbar spine obtained in the operating room in frontal and lateral projections. Posterior rod with intrapedicular screw fusion and interbody spacer at L4-L5. Fluoroscopy time 46 seconds. Dose 28.96 mGy. IMPRESSION: Intraoperative fluoroscopy during L4-L5 fusion  PATIENT SURVEYS:  Modified Oswestry 6/50   COGNITION: Overall cognitive status: Within functional limits for tasks assessed     SENSATION: WFL  MUSCLE LENGTH: Hamstrings: Right 140 deg; Left 130 deg  POSTURE: flexed trunk ~ 15 deg walking   LUMBAR ROM:   AROM eval   Flexion 85 tight hamstrings  Extension 30   Right lateral flexion To knees  Left lateral flexion To knees  Right rotation WNL  Left rotation WNL   (Blank rows = not tested)  LOWER EXTREMITY ROM:   very good hip mobility for both ER and IR, symmetric and pain free   LOWER EXTREMITY MMT:    MMT Right eval Left eval  Hip flexion 5 5  Hip extension 4+ 4+  Hip abduction 5 5  Hip adduction 5 5  Knee flexion 5 5  Knee extension 5 5  Ankle dorsiflexion 5 5  Ankle plantarflexion 5 5   (Blank rows = not tested)  FUNCTIONAL TESTS:  6 minute walk test: 1025' Functional gait assessment: 22/30  GAIT: Distance walked: 77' Assistive device utilized: None Level of assistance: Complete Independence Comments: maintains forward flexion   TODAY'S TREATMENT:  DATE:   08/30/23: therex: instructed and progressed in the following to improve pts core strength, balance and endurance: Bike 6 min level 3 Prone with pillow under abdomen for  B TKE's, 5 sec holds, 15 reps " Alt hip and knee ext Quadriped alt shoulder flex Seated B hamstring curls 35#, 10x 3 Seated B knee ext 15#, 15 x 2 Standing tandem, 30 sec holds, alt lead foot B heel drops/toe raises off step with b UE support 20 reps High marching, emphasis on thighs ll to floor, 15x each leg, alternating   08/28/2023 Therapeutic Exercise: to improve strength and mobility.  Demo, verbal and tactile cues throughout for technique. Bike L2 x 6 min  PPT x 10  Bridges 2  x 10  SLR 2 x 10 bil - noted decreased all back pain Bent knee fall outs with TrA x 10 each  Therapeutic Activity:   FGA 22/30    08/22/23 - 1025 ft for warm up - cues to stand upright and increase heel strike Sit to stand x 10 Gastroc stretch 2x 30 sec B Post pelvic tilt 5 sec x 10 Bridge x 10 Supine SLR x 10 B cues to keep quads  engaged (L difficult) Dynamic HS stretch x 12 B Worked on TA contraction and trying to isolate from general abs. Used various cues including pelvic floor. Patient able to engage but has difficulty isolating.  08/21/23 Self Care: Findings, POC, initial HEP Bridges x 10 - discomfort, after cues for TrA bracing - no pain Dynamic hamstring stretches x 10 each side Supine SLR x 10  SLR x 10 bil      PATIENT EDUCATION:  Education details: HEP review Person educated: Patient Education method: Programmer, multimedia, Demonstration, Verbal cues, and Handouts Education comprehension: verbalized understanding and returned demonstration  HOME EXERCISE PROGRAM: Access Code: [email or web address] URL: [email or web address] Date: 08/28/2023 Prepared by: Harrie Foreman  Exercises - Supine Posterior Pelvic Tilt  - 1-2 x daily - 7 x weekly - 2 sets - 10 reps - 5 sec hold - Supine Bridge  - 1 x daily - 7 x weekly - 2 sets - 10 reps - Supine Active Straight Leg Raise  - 1 x daily - 7 x weekly - 2 sets - 10 reps - Bent Knee Fallouts  - 1 x daily - 7 x weekly - 2 sets - 10 reps - Supine Hamstring Stretch  - 1 x daily - 7 x weekly - 2 sets - 10 reps - Bird Dog Progression  - 1 x daily - 7 x weekly - 2 sets - 10 reps - Standing Hamstring Curl with Resistance  - 1 x daily - 7 x weekly - 2 sets - 10 reps - Standing Hip Abduction with Resistance at Thighs  - 1 x daily - 7 x weekly - 2 sets - 10 reps - 3 sec hold - Standing Hip Extension with Counter Support  - 1 x daily - 7 x weekly - 2 sets - 10 reps  ASSESSMENT:  CLINICAL IMPRESSION: NELLY DEVERS reported no pain today, reports improves with each PT session.  Advanced difficulty of therex today and he tolerated very well.  Also addressed more balance activities due to his scores on FGA.   [name] continues to demonstrate potential for improvement and would benefit from continued skilled therapy to address impairments.     OBJECTIVE  IMPAIRMENTS: Abnormal gait, decreased activity tolerance, decreased endurance, difficulty walking, decreased ROM, decreased strength, impaired flexibility,  postural dysfunction, and pain.   ACTIVITY LIMITATIONS: carrying, lifting, bending, standing, and locomotion level  PARTICIPATION LIMITATIONS: community activity and yard work  PERSONAL FACTORS: Time since onset of injury/illness/exacerbation and 1-2 comorbidities: Chronic lymphocytic leukemia, s/p L L4-5 transforaminal lumbar interbody fusion and decompression, with instrumentation and allograft on 05/17/2023, SIADH, HTN, Anxiety and Depression.  are also affecting patient's functional outcome.   REHAB POTENTIAL: Good  CLINICAL DECISION MAKING: Evolving/moderate complexity  EVALUATION COMPLEXITY: Moderate   GOALS: Goals reviewed with patient? Yes  SHORT TERM GOALS: Target date: 09/04/2023   Patient will be independent with initial HEP.  Baseline: given Goal status: IN PROGRESS   LONG TERM GOALS: Target date: 10/16/2023   Patient will be independent with advanced/ongoing HEP to improve outcomes and carryover.  Baseline:  Goal status: IN PROGRESS 08/28/23- reviewed  2.  Patient demonstrate improved posture with gait.    Baseline: forward flexed posture with gait ~ 15 deg Goal status: IN PROGRESS  3.  Patient will demonstrate improved hip extensor strength to 5/5.  Baseline: see objective Goal status: IN PROGRESS  4.  Patient will be able to lift 20lbs safely with good lifting technique to avoid lumbar strain to perform household ADLS. Baseline: has been under lifting restrictions.  Goal status: IN PROGRESS  5.  Patient will report at least 6 points improvement on modified Oswestry to demonstrate improved functional ability.  Baseline: 6/50 Goal status: IN PROGRESS   6.  Patient will be able to return to all activities without limitation from LBP. Baseline: has stopped fishing Goal status: IN PROGRESS PLAN:  PT  FREQUENCY: 1-2x/week  PT DURATION: 8 weeks  PLANNED INTERVENTIONS: Therapeutic exercises, Therapeutic activity, Neuromuscular re-education, Balance training, Gait training, Patient/Family education, Self Care, Joint mobilization, Stair training, Orthotic/Fit training, Dry Needling, Electrical stimulation, Spinal manipulation, Spinal mobilization, Cryotherapy, Moist heat, Taping, Ultrasound, Ionotophoresis 4mg /ml Dexamethasone, Manual therapy, and Re-evaluation.  PLAN FOR NEXT SESSION: Continue core strengthening exerises.     Per Beagley L Orlanda Frankum, PT, DPT , OCS 08/30/2023, 2:53 PM

## 2023-09-04 ENCOUNTER — Encounter: Payer: Self-pay | Admitting: Physical Therapy

## 2023-09-04 ENCOUNTER — Ambulatory Visit: Payer: Medicare HMO | Admitting: Physical Therapy

## 2023-09-04 DIAGNOSIS — R2689 Other abnormalities of gait and mobility: Secondary | ICD-10-CM | POA: Diagnosis not present

## 2023-09-04 DIAGNOSIS — M5459 Other low back pain: Secondary | ICD-10-CM

## 2023-09-04 DIAGNOSIS — M6281 Muscle weakness (generalized): Secondary | ICD-10-CM

## 2023-09-04 NOTE — Therapy (Signed)
OUTPATIENT PHYSICAL THERAPY TREATMENT   Patient Name: Bobby [name] MRN: [id number] DOB:1951/06/16, 72 y.o., male Today's Date: 09/04/2023  END OF SESSION:  PT End of Session - 09/04/23 1104     Visit Number 5    Date for PT Re-Evaluation 10/16/23    Authorization Type [employer] MCR    Progress Note Due on Visit 10    PT Start Time 1108    PT Stop Time 1154    PT Time Calculation (min) 46 min    Activity Tolerance Patient tolerated treatment well    Behavior During Therapy [facility] for tasks assessed/performed              Past Medical History:  Diagnosis Date   Anxiety and depression    Hypertension    Hyponatremia 03/2013   ongoing, seeing Dr. Allena Katz   Insomnia    Melanoma in situ of back [facility])    biopsy on 03/05/2020   SIADH (syndrome of inappropriate ADH production) (HCC) 2017   Dr. Allena Katz   Squamous cell carcinoma in situ    Past Surgical History:  Procedure Laterality Date   BRONCHIAL BIOPSY  11/27/2022   Procedure: BRONCHIAL BIOPSIES;  Surgeon: Leslye Peer, MD;  Location: [facility] ENDOSCOPY;  Service: Cardiopulmonary;;   BRONCHIAL BRUSHINGS  11/27/2022   Procedure: BRONCHIAL BRUSHINGS;  Surgeon: Leslye Peer, MD;  Location: [facility] ENDOSCOPY;  Service: Cardiopulmonary;;   BRONCHIAL WASHINGS  11/27/2022   Procedure: BRONCHIAL WASHINGS;  Surgeon: Leslye Peer, MD;  Location: [name];  Service: Cardiopulmonary;;   MOHS SURGERY  ~ 04/2020   face, [facility]   SHOULDER SURGERY  1980s   right shoulder   TRANSFORAMINAL LUMBAR INTERBODY FUSION (TLIF) WITH PEDICLE SCREW FIXATION 1 LEVEL Left 05/17/2023   Procedure: LEFT-SIDED LUMBAR 4- LUMBAR 5 TRANSFORAMINAL LUMBAR INTERBODY FUSION AND DECOMPRESSION WITH INSTRUMENTATION AND ALLOGRAFT;  Surgeon: Estill Bamberg, MD;  Location: [facility] OR;  Service: Orthopedics;  Laterality: Left;   VIDEO BRONCHOSCOPY N/A 11/27/2022   Procedure: VIDEO BRONCHOSCOPY WITH FLUORO;  Surgeon: Leslye Peer, MD;  Location: [facility] ENDOSCOPY;  Service:  Cardiopulmonary;  Laterality: N/A;   Patient Active Problem List   Diagnosis Date Noted   Radiculopathy of lumbar region 05/17/2023   CLL (chronic lymphocytic leukemia) (HCC) 02/21/2023   Abnormal CT of the chest 11/01/2022   Melanoma in situ of back (HCC) 03/16/2020   Squamous cell carcinoma in situ 08/21/2019   Vitamin D deficiency 05/29/2019   Cough 01/01/2016   PCP NOTES >>>> 09/28/2015   Hypogonadism in male 05/13/2015   SIADH (syndrome of inappropriate ADH production) (HCC) 04/13/2013   Memory problem 04/13/2013   Annual physical exam 03/06/2012   Idiopathic scoliosis and kyphoscoliosis 08/18/2010   BACK PAIN, LUMBAR 07/21/2010   Anxiety state 04/01/2009   Essential hypertension, benign 01/04/2009   INSOMNIA-SLEEP DISORDER-UNSPEC 09/08/2008    PCP: Wanda Plump, MD   REFERRING PROVIDER: Estill Bamberg, MD   REFERRING DIAG: S32.010A (ICD-10-CM) - Wedge compression fracture of first lumbar vertebra, initial encounter for closed fracture   Rationale for Evaluation and Treatment: Rehabilitation  THERAPY DIAG:  Other low back pain  Other abnormalities of gait and mobility  Muscle weakness (generalized)  ONSET DATE: s/p L L4-5 transforaminal lumbar interbody fusion and decompression, with instrumentation and allograft on 05/17/2023  SUBJECTIVE:  SUBJECTIVE STATEMENT:  Got a crick in his neck doing laying down exercises.    Eval: Patient had surgery about 12 weeks ago. Had to wear TLSO for 6 weeks, then x-rays, then just had to wear the LSO, now does not have to wear any brace.  Did not have any PT during that time.  He had weight restrictions etc, to make sure healed, has one more follow-up for X-rays, but then should be released.  He reports that the pain disappeared right after surgery.   Surgeon wants him to walk more.  He had previous PT to strengthen back prior to surgery.    PERTINENT HISTORY:  Chronic lymphocytic leukemia, s/p L L4-5 transforaminal lumbar interbody fusion and decompression, with instrumentation and allograft on 05/17/2023, SIADH, HTN, Anxiety and Depression.  PAIN:  Are you having pain? Yes: NPRS scale: 0/10 Pain location: low back Pain description: just a feeling, not really pain Aggravating factors: sitting too much Relieving factors: walking  PRECAUTIONS: Back  no lifting more than ~ 20lbs   RED FLAGS: None   WEIGHT BEARING RESTRICTIONS: No  FALLS:  Has patient fallen in last 6 months? No  LIVING ENVIRONMENT: Lives with: lives with their spouse Lives in: House/apartment Stairs: Yes: Internal: 11 steps; can reach both and External: 5 steps; on right going up Has following equipment at home: None  OCCUPATION: works at desk, on computer most of the day  PLOF: Independent; likes fly fishing  PATIENT GOALS: get back muscles stronger, walk more, get back to fly fishing and biking  NEXT MD VISIT: beginning of November  OBJECTIVE:   DIAGNOSTIC FINDINGS:  Result Date: 05/17/2023 CLINICAL DATA:  Elective surgery. EXAM: LUMBAR SPINE - 2-3 VIEW COMPARISON:  None Available. FINDINGS: Two fluoroscopic spot views of the lumbar spine obtained in the operating room in frontal and lateral projections. Posterior rod with intrapedicular screw fusion and interbody spacer at L4-L5. Fluoroscopy time 46 seconds. Dose 28.96 mGy. IMPRESSION: Intraoperative fluoroscopy during L4-L5 fusion  PATIENT SURVEYS:  Modified Oswestry 6/50   COGNITION: Overall cognitive status: Within functional limits for tasks assessed     SENSATION: WFL  MUSCLE LENGTH: Hamstrings: Right 140 deg; Left 130 deg  POSTURE: flexed trunk ~ 15 deg walking   LUMBAR ROM:   AROM eval  Flexion 85 tight hamstrings  Extension 30   Right lateral flexion To knees  Left lateral  flexion To knees  Right rotation WNL  Left rotation WNL   (Blank rows = not tested)  LOWER EXTREMITY ROM:   very good hip mobility for both ER and IR, symmetric and pain free   LOWER EXTREMITY MMT:    MMT Right eval Left eval  Hip flexion 5 5  Hip extension 4+ 4+  Hip abduction 5 5  Hip adduction 5 5  Knee flexion 5 5  Knee extension 5 5  Ankle dorsiflexion 5 5  Ankle plantarflexion 5 5   (Blank rows = not tested)  FUNCTIONAL TESTS:  6 minute walk test: 1025' Functional gait assessment: 22/30  GAIT: Distance walked: 58' Assistive device utilized: None Level of assistance: Complete Independence Comments: maintains forward flexion   TODAY'S TREATMENT:  DATE:   09/04/23 Therapeutic Exercise: to improve strength and mobility.  Demo, verbal and tactile cues throughout for technique. Bike L3 x 6 min  Step ups (2 risers) x 10 bil  2 UE support for balance Side step ups (1 riser) x 10 bil - 1 UE support BATCA rows 20# x 20 high bars, x 20 low bars BATCA hamstring curls 25# x 20 BATCA leg extension 25# x 20 BATCA standing lat pulls 20# x 15 RDLs with 10# kettlebell - knees to thighs, cues for form, x 10 no pain Review of HEP    08/30/23: therex: instructed and progressed in the following to improve pts core strength, balance and endurance: Bike 6 min level 3 Prone with pillow under abdomen for  B TKE's, 5 sec holds, 15 reps " Alt hip and knee ext Quadruped alt shoulder flex Seated B hamstring curls 35#, 10x 3 Seated B knee ext 15#, 15 x 2 Standing tandem, 30 sec holds, alt lead foot B heel drops/toe raises off step with b UE support 20 reps High marching, emphasis on thighs ll to floor, 15x each leg, alternating   08/28/2023 Therapeutic Exercise: to improve strength and mobility.  Demo, verbal and tactile cues throughout for technique. Bike L2  x 6 min  PPT x 10  Bridges 2  x 10  SLR 2 x 10 bil - noted decreased all back pain Bent knee fall outs with TrA x 10 each  Therapeutic Activity:   FGA 22/30    08/22/23 - 1025 ft for warm up - cues to stand upright and increase heel strike Sit to stand x 10 Gastroc stretch 2x 30 sec B Post pelvic tilt 5 sec x 10 Bridge x 10 Supine SLR x 10 B cues to keep quads engaged (L difficult) Dynamic HS stretch x 12 B Worked on TA contraction and trying to isolate from general abs. Used various cues including pelvic floor. Patient able to engage but has difficulty isolating.  08/21/23 Self Care: Findings, POC, initial HEP Bridges x 10 - discomfort, after cues for TrA bracing - no pain Dynamic hamstring stretches x 10 each side Supine SLR x 10  SLR x 10 bil      PATIENT EDUCATION:  Education details: HEP review Person educated: Patient Education method: Programmer, multimedia, Demonstration, Verbal cues, and Handouts Education comprehension: verbalized understanding and returned demonstration  HOME EXERCISE PROGRAM: Access Code: [email or web address] URL: [email or web address] Date: 09/04/2023 Prepared by: Harrie Foreman  Exercises - Supine Posterior Pelvic Tilt  - 1-2 x daily - 3 x weekly - 2 sets - 10 reps - 5 sec hold - Supine Bridge  - 1 x daily - 3 x weekly - 2 sets - 10 reps - Supine Active Straight Leg Raise  - 1 x daily - 3 x weekly - 2 sets - 10 reps - Bent Knee Fallouts  - 1 x daily - 3 x weekly - 2 sets - 10 reps - Supine 90/90 Sciatic Nerve Glide with Knee Flexion/Extension  - 1 x daily - 3 x weekly - 1 sets - 10 reps - Bird Dog Progression  - 1 x daily - 3 x weekly - 2 sets - 10 reps - Forward Step Up  - 1 x daily - 3 x weekly - 2 sets - 10 reps - Lateral Step Up with Counter Support  - 1 x daily - 3 x weekly - 2 sets - 10 reps - [place] Deadlift With Barbell  - 1 x  daily - 3 x weekly - 2 sets - 10 reps- to do from thigh to knee with light weight  only  ASSESSMENT:  CLINICAL IMPRESSION: CHRISTAPHER WELSER reported some difficulty with medbridge program, so problem solved and got app on home page so could access easily, also updated and discussed that he did not have to perform all exercises daily.   Also streamlined program.  Has met STG #1 for HEP. Today advanced exercises, introduced light resistance, including RDLs, focusing on form.  No pain with exercise.  [name] continues to demonstrate potential for improvement and would benefit from continued skilled therapy to address impairments.     OBJECTIVE IMPAIRMENTS: Abnormal gait, decreased activity tolerance, decreased endurance, difficulty walking, decreased ROM, decreased strength, impaired flexibility, postural dysfunction, and pain.   ACTIVITY LIMITATIONS: carrying, lifting, bending, standing, and locomotion level  PARTICIPATION LIMITATIONS: community activity and yard work  PERSONAL FACTORS: Time since onset of injury/illness/exacerbation and 1-2 comorbidities: Chronic lymphocytic leukemia, s/p L L4-5 transforaminal lumbar interbody fusion and decompression, with instrumentation and allograft on 05/17/2023, SIADH, HTN, Anxiety and Depression.  are also affecting patient's functional outcome.   REHAB POTENTIAL: Good  CLINICAL DECISION MAKING: Evolving/moderate complexity  EVALUATION COMPLEXITY: Moderate   GOALS: Goals reviewed with patient? Yes  SHORT TERM GOALS: Target date: 09/04/2023   Patient will be independent with initial HEP.  Baseline: given Goal status: MET 09/04/23   LONG TERM GOALS: Target date: 10/16/2023   Patient will be independent with advanced/ongoing HEP to improve outcomes and carryover.  Baseline:  Goal status: IN PROGRESS 08/28/23- reviewed  2.  Patient demonstrate improved posture with gait.    Baseline: forward flexed posture with gait ~ 15 deg Goal status: IN PROGRESS 09/04/23- upright, no longer shuffling  3.  Patient will  demonstrate improved hip extensor strength to 5/5.  Baseline: see objective Goal status: IN PROGRESS  4.  Patient will be able to lift 20lbs safely with good lifting technique to avoid lumbar strain to perform household ADLS. Baseline: has been under lifting restrictions.  Goal status: IN PROGRESS 09/04/23- 10# today without pain  5.  Patient will report at least 6 points improvement on modified Oswestry to demonstrate improved functional ability.  Baseline: 6/50 Goal status: IN PROGRESS   6.  Patient will be able to return to all activities without limitation from LBP. Baseline: has stopped fishing Goal status: IN PROGRESS PLAN:  PT FREQUENCY: 1-2x/week  PT DURATION: 8 weeks  PLANNED INTERVENTIONS: Therapeutic exercises, Therapeutic activity, Neuromuscular re-education, Balance training, Gait training, Patient/Family education, Self Care, Joint mobilization, Stair training, Orthotic/Fit training, Dry Needling, Electrical stimulation, Spinal manipulation, Spinal mobilization, Cryotherapy, Moist heat, Taping, Ultrasound, Ionotophoresis 4mg /ml Dexamethasone, Manual therapy, and Re-evaluation.  PLAN FOR NEXT SESSION: Continue core strengthening exercises, progress lifting tolerance.     Jena Gauss, PT, DPT  09/04/2023, 12:03 PM

## 2023-09-07 ENCOUNTER — Ambulatory Visit: Payer: Medicare HMO

## 2023-09-07 DIAGNOSIS — R2689 Other abnormalities of gait and mobility: Secondary | ICD-10-CM | POA: Diagnosis not present

## 2023-09-07 DIAGNOSIS — M5459 Other low back pain: Secondary | ICD-10-CM

## 2023-09-07 DIAGNOSIS — M6281 Muscle weakness (generalized): Secondary | ICD-10-CM

## 2023-09-07 NOTE — Therapy (Signed)
OUTPATIENT PHYSICAL THERAPY TREATMENT   Patient Name: Bobby [name] MRN: [id number] DOB:August 06, 1951, 72 y.o., male Today's Date: 09/07/2023  END OF SESSION:  PT End of Session - 09/07/23 1105     Visit Number 6    Date for PT Re-Evaluation 10/16/23    Authorization Type [employer] MCR    Progress Note Due on Visit 10    PT Start Time 1100    PT Stop Time 1145    PT Time Calculation (min) 45 min    Activity Tolerance Patient tolerated treatment well    Behavior During Therapy [facility] for tasks assessed/performed               Past Medical History:  Diagnosis Date   Anxiety and depression    Hypertension    Hyponatremia 03/2013   ongoing, seeing Dr. Allena Katz   Insomnia    Melanoma in situ of back [facility])    biopsy on 03/05/2020   SIADH (syndrome of inappropriate ADH production) (HCC) 2017   Dr. Allena Katz   Squamous cell carcinoma in situ    Past Surgical History:  Procedure Laterality Date   BRONCHIAL BIOPSY  11/27/2022   Procedure: BRONCHIAL BIOPSIES;  Surgeon: Leslye Peer, MD;  Location: [facility] ENDOSCOPY;  Service: Cardiopulmonary;;   BRONCHIAL BRUSHINGS  11/27/2022   Procedure: BRONCHIAL BRUSHINGS;  Surgeon: Leslye Peer, MD;  Location: [facility] ENDOSCOPY;  Service: Cardiopulmonary;;   BRONCHIAL WASHINGS  11/27/2022   Procedure: BRONCHIAL WASHINGS;  Surgeon: Leslye Peer, MD;  Location: [name];  Service: Cardiopulmonary;;   MOHS SURGERY  ~ 04/2020   face, [facility]   SHOULDER SURGERY  1980s   right shoulder   TRANSFORAMINAL LUMBAR INTERBODY FUSION (TLIF) WITH PEDICLE SCREW FIXATION 1 LEVEL Left 05/17/2023   Procedure: LEFT-SIDED LUMBAR 4- LUMBAR 5 TRANSFORAMINAL LUMBAR INTERBODY FUSION AND DECOMPRESSION WITH INSTRUMENTATION AND ALLOGRAFT;  Surgeon: Estill Bamberg, MD;  Location: [facility] OR;  Service: Orthopedics;  Laterality: Left;   VIDEO BRONCHOSCOPY N/A 11/27/2022   Procedure: VIDEO BRONCHOSCOPY WITH FLUORO;  Surgeon: Leslye Peer, MD;  Location: [facility] ENDOSCOPY;  Service:  Cardiopulmonary;  Laterality: N/A;   Patient Active Problem List   Diagnosis Date Noted   Radiculopathy of lumbar region 05/17/2023   CLL (chronic lymphocytic leukemia) (HCC) 02/21/2023   Abnormal CT of the chest 11/01/2022   Melanoma in situ of back (HCC) 03/16/2020   Squamous cell carcinoma in situ 08/21/2019   Vitamin D deficiency 05/29/2019   Cough 01/01/2016   PCP NOTES >>>> 09/28/2015   Hypogonadism in male 05/13/2015   SIADH (syndrome of inappropriate ADH production) (HCC) 04/13/2013   Memory problem 04/13/2013   Annual physical exam 03/06/2012   Idiopathic scoliosis and kyphoscoliosis 08/18/2010   BACK PAIN, LUMBAR 07/21/2010   Anxiety state 04/01/2009   Essential hypertension, benign 01/04/2009   INSOMNIA-SLEEP DISORDER-UNSPEC 09/08/2008    PCP: Wanda Plump, MD   REFERRING PROVIDER: Estill Bamberg, MD   REFERRING DIAG: S32.010A (ICD-10-CM) - Wedge compression fracture of first lumbar vertebra, initial encounter for closed fracture   Rationale for Evaluation and Treatment: Rehabilitation  THERAPY DIAG:  Other low back pain  Other abnormalities of gait and mobility  Muscle weakness (generalized)  ONSET DATE: s/p L L4-5 transforaminal lumbar interbody fusion and decompression, with instrumentation and allograft on 05/17/2023  SUBJECTIVE:  SUBJECTIVE STATEMENT:  Pt denies any pain. Feels that he needs to work more on stair climbing.  Eval: Patient had surgery about 12 weeks ago. Had to wear TLSO for 6 weeks, then x-rays, then just had to wear the LSO, now does not have to wear any brace.  Did not have any PT during that time.  He had weight restrictions etc, to make sure healed, has one more follow-up for X-rays, but then should be released.  He reports that the pain disappeared right  after surgery.  Surgeon wants him to walk more.  He had previous PT to strengthen back prior to surgery.    PERTINENT HISTORY:  Chronic lymphocytic leukemia, s/p L L4-5 transforaminal lumbar interbody fusion and decompression, with instrumentation and allograft on 05/17/2023, SIADH, HTN, Anxiety and Depression.  PAIN:  Are you having pain? Yes: NPRS scale: 0/10 Pain location: low back Pain description: just a feeling, not really pain Aggravating factors: sitting too much Relieving factors: walking  PRECAUTIONS: Back  no lifting more than ~ 20lbs   RED FLAGS: None   WEIGHT BEARING RESTRICTIONS: No  FALLS:  Has patient fallen in last 6 months? No  LIVING ENVIRONMENT: Lives with: lives with their spouse Lives in: House/apartment Stairs: Yes: Internal: 11 steps; can reach both and External: 5 steps; on right going up Has following equipment at home: None  OCCUPATION: works at desk, on computer most of the day  PLOF: Independent; likes fly fishing  PATIENT GOALS: get back muscles stronger, walk more, get back to fly fishing and biking  NEXT MD VISIT: beginning of November  OBJECTIVE:   DIAGNOSTIC FINDINGS:  Result Date: 05/17/2023 CLINICAL DATA:  Elective surgery. EXAM: LUMBAR SPINE - 2-3 VIEW COMPARISON:  None Available. FINDINGS: Two fluoroscopic spot views of the lumbar spine obtained in the operating room in frontal and lateral projections. Posterior rod with intrapedicular screw fusion and interbody spacer at L4-L5. Fluoroscopy time 46 seconds. Dose 28.96 mGy. IMPRESSION: Intraoperative fluoroscopy during L4-L5 fusion  PATIENT SURVEYS:  Modified Oswestry 6/50   COGNITION: Overall cognitive status: Within functional limits for tasks assessed     SENSATION: WFL  MUSCLE LENGTH: Hamstrings: Right 140 deg; Left 130 deg  POSTURE: flexed trunk ~ 15 deg walking   LUMBAR ROM:   AROM eval  Flexion 85 tight hamstrings  Extension 30   Right lateral flexion To knees   Left lateral flexion To knees  Right rotation WNL  Left rotation WNL   (Blank rows = not tested)  LOWER EXTREMITY ROM:   very good hip mobility for both ER and IR, symmetric and pain free   LOWER EXTREMITY MMT:    MMT Right eval Left eval  Hip flexion 5 5  Hip extension 4+ 4+  Hip abduction 5 5  Hip adduction 5 5  Knee flexion 5 5  Knee extension 5 5  Ankle dorsiflexion 5 5  Ankle plantarflexion 5 5   (Blank rows = not tested)  FUNCTIONAL TESTS:  6 minute walk test: 1025' Functional gait assessment: 22/30  GAIT: Distance walked: 33' Assistive device utilized: None Level of assistance: Complete Independence Comments: maintains forward flexion   TODAY'S TREATMENT:  DATE:  09/07/23 Therapeutic Exercise: to improve strength and mobility.  Demo, verbal and tactile cues throughout for technique. Bike L3x72min Step ups no UE support on 8' step x 10 BLE Lateral step ups no UE support on 8' step x 10 BLE Standing shoulder extension 20# x 12  Pallof press 10# x 10 bil RDLs with 10# kettlebell x 10  09/04/23 Therapeutic Exercise: to improve strength and mobility.  Demo, verbal and tactile cues throughout for technique. Bike L3 x 6 min  Step ups (2 risers) x 10 bil  2 UE support for balance Side step ups (1 riser) x 10 bil - 1 UE support BATCA rows 20# x 20 high bars, x 20 low bars BATCA hamstring curls 25# x 20 BATCA leg extension 25# x 20 BATCA standing lat pulls 20# x 15 RDLs with 10# kettlebell - knees to thighs, cues for form, x 10 no pain Review of HEP    08/30/23: therex: instructed and progressed in the following to improve pts core strength, balance and endurance: Bike 6 min level 3 Prone with pillow under abdomen for  B TKE's, 5 sec holds, 15 reps " Alt hip and knee ext Quadruped alt shoulder flex Seated B hamstring curls 35#, 10x  3 Seated B knee ext 15#, 15 x 2 Standing tandem, 30 sec holds, alt lead foot B heel drops/toe raises off step with b UE support 20 reps High marching, emphasis on thighs ll to floor, 15x each leg, alternating   08/28/2023 Therapeutic Exercise: to improve strength and mobility.  Demo, verbal and tactile cues throughout for technique. Bike L2 x 6 min  PPT x 10  Bridges 2  x 10  SLR 2 x 10 bil - noted decreased all back pain Bent knee fall outs with TrA x 10 each  Therapeutic Activity:   FGA 22/30    08/22/23 - 1025 ft for warm up - cues to stand upright and increase heel strike Sit to stand x 10 Gastroc stretch 2x 30 sec B Post pelvic tilt 5 sec x 10 Bridge x 10 Supine SLR x 10 B cues to keep quads engaged (L difficult) Dynamic HS stretch x 12 B Worked on TA contraction and trying to isolate from general abs. Used various cues including pelvic floor. Patient able to engage but has difficulty isolating.  08/21/23 Self Care: Findings, POC, initial HEP Bridges x 10 - discomfort, after cues for TrA bracing - no pain Dynamic hamstring stretches x 10 each side Supine SLR x 10  SLR x 10 bil      PATIENT EDUCATION:  Education details: HEP review Person educated: Patient Education method: Programmer, multimedia, Demonstration, Verbal cues, and Handouts Education comprehension: verbalized understanding and returned demonstration  HOME EXERCISE PROGRAM: Access Code: [email or web address] URL: [email or web address] Date: 09/04/2023 Prepared by: Harrie Foreman  Exercises - Supine Posterior Pelvic Tilt  - 1-2 x daily - 3 x weekly - 2 sets - 10 reps - 5 sec hold - Supine Bridge  - 1 x daily - 3 x weekly - 2 sets - 10 reps - Supine Active Straight Leg Raise  - 1 x daily - 3 x weekly - 2 sets - 10 reps - Bent Knee Fallouts  - 1 x daily - 3 x weekly - 2 sets - 10 reps - Supine 90/90 Sciatic Nerve Glide with Knee Flexion/Extension  - 1 x daily - 3 x weekly - 1 sets - 10 reps - Bird Dog  Progression  - 1  x daily - 3 x weekly - 2 sets - 10 reps - Forward Step Up  - 1 x daily - 3 x weekly - 2 sets - 10 reps - Lateral Step Up with Counter Support  - 1 x daily - 3 x weekly - 2 sets - 10 reps - [place] Deadlift With Barbell  - 1 x daily - 3 x weekly - 2 sets - 10 reps- to do from thigh to knee with light weight only  ASSESSMENT:  CLINICAL IMPRESSION: Pt shows good tolerance for exercise. Focused on core strengthening and hip strength for stair navigation today. Cues for neutral spine with RDLs. Cues with step ups also for full ROM. No increased pain or sx from treatment. [name] continues to demonstrate potential for improvement and would benefit from continued skilled therapy to address impairments.     OBJECTIVE IMPAIRMENTS: Abnormal gait, decreased activity tolerance, decreased endurance, difficulty walking, decreased ROM, decreased strength, impaired flexibility, postural dysfunction, and pain.   ACTIVITY LIMITATIONS: carrying, lifting, bending, standing, and locomotion level  PARTICIPATION LIMITATIONS: community activity and yard work  PERSONAL FACTORS: Time since onset of injury/illness/exacerbation and 1-2 comorbidities: Chronic lymphocytic leukemia, s/p L L4-5 transforaminal lumbar interbody fusion and decompression, with instrumentation and allograft on 05/17/2023, SIADH, HTN, Anxiety and Depression.  are also affecting patient's functional outcome.   REHAB POTENTIAL: Good  CLINICAL DECISION MAKING: Evolving/moderate complexity  EVALUATION COMPLEXITY: Moderate   GOALS: Goals reviewed with patient? Yes  SHORT TERM GOALS: Target date: 09/04/2023   Patient will be independent with initial HEP.  Baseline: given Goal status: MET 09/04/23   LONG TERM GOALS: Target date: 10/16/2023   Patient will be independent with advanced/ongoing HEP to improve outcomes and carryover.  Baseline:  Goal status: IN PROGRESS 08/28/23- reviewed  2.  Patient demonstrate  improved posture with gait.    Baseline: forward flexed posture with gait ~ 15 deg Goal status: IN PROGRESS 09/04/23- upright, no longer shuffling  3.  Patient will demonstrate improved hip extensor strength to 5/5.  Baseline: see objective Goal status: IN PROGRESS  4.  Patient will be able to lift 20lbs safely with good lifting technique to avoid lumbar strain to perform household ADLS. Baseline: has been under lifting restrictions.  Goal status: IN PROGRESS 09/04/23- 10# today without pain  5.  Patient will report at least 6 points improvement on modified Oswestry to demonstrate improved functional ability.  Baseline: 6/50 Goal status: IN PROGRESS   6.  Patient will be able to return to all activities without limitation from LBP. Baseline: has stopped fishing Goal status: IN PROGRESS PLAN:  PT FREQUENCY: 1-2x/week  PT DURATION: 8 weeks  PLANNED INTERVENTIONS: Therapeutic exercises, Therapeutic activity, Neuromuscular re-education, Balance training, Gait training, Patient/Family education, Self Care, Joint mobilization, Stair training, Orthotic/Fit training, Dry Needling, Electrical stimulation, Spinal manipulation, Spinal mobilization, Cryotherapy, Moist heat, Taping, Ultrasound, Ionotophoresis 4mg /ml Dexamethasone, Manual therapy, and Re-evaluation.  PLAN FOR NEXT SESSION: Continue core strengthening exercises, progress lifting tolerance.     Darleene Cleaver, PTA 09/07/2023, 11:51 AM

## 2023-09-11 ENCOUNTER — Ambulatory Visit: Payer: Medicare HMO

## 2023-09-11 ENCOUNTER — Other Ambulatory Visit: Payer: Self-pay

## 2023-09-11 DIAGNOSIS — R2689 Other abnormalities of gait and mobility: Secondary | ICD-10-CM | POA: Diagnosis not present

## 2023-09-11 DIAGNOSIS — M6281 Muscle weakness (generalized): Secondary | ICD-10-CM

## 2023-09-11 DIAGNOSIS — M5459 Other low back pain: Secondary | ICD-10-CM

## 2023-09-11 DIAGNOSIS — M48062 Spinal stenosis, lumbar region with neurogenic claudication: Secondary | ICD-10-CM

## 2023-09-11 NOTE — Therapy (Signed)
OUTPATIENT PHYSICAL THERAPY TREATMENT   Patient Name: Bobby [name] MRN: [id number] DOB:23-Sep-1951, 72 y.o., male Today's Date: 09/11/2023  END OF SESSION:  PT End of Session - 09/11/23 1203     Visit Number 7    Date for PT Re-Evaluation 10/16/23    Progress Note Due on Visit 10    PT Start Time 1101    PT Stop Time 1148    PT Time Calculation (min) 47 min    Activity Tolerance Patient tolerated treatment well    Behavior During Therapy [facility] for tasks assessed/performed                Past Medical History:  Diagnosis Date   Anxiety and depression    Hypertension    Hyponatremia 03/2013   ongoing, seeing Dr. Allena Katz   Insomnia    Melanoma in situ of back [facility])    biopsy on 03/05/2020   SIADH (syndrome of inappropriate ADH production) (HCC) 2017   Dr. Allena Katz   Squamous cell carcinoma in situ    Past Surgical History:  Procedure Laterality Date   BRONCHIAL BIOPSY  11/27/2022   Procedure: BRONCHIAL BIOPSIES;  Surgeon: Leslye Peer, MD;  Location: [facility] ENDOSCOPY;  Service: Cardiopulmonary;;   BRONCHIAL BRUSHINGS  11/27/2022   Procedure: BRONCHIAL BRUSHINGS;  Surgeon: Leslye Peer, MD;  Location: [facility] ENDOSCOPY;  Service: Cardiopulmonary;;   BRONCHIAL WASHINGS  11/27/2022   Procedure: BRONCHIAL WASHINGS;  Surgeon: Leslye Peer, MD;  Location: [name];  Service: Cardiopulmonary;;   MOHS SURGERY  ~ 04/2020   face, [facility]   SHOULDER SURGERY  1980s   right shoulder   TRANSFORAMINAL LUMBAR INTERBODY FUSION (TLIF) WITH PEDICLE SCREW FIXATION 1 LEVEL Left 05/17/2023   Procedure: LEFT-SIDED LUMBAR 4- LUMBAR 5 TRANSFORAMINAL LUMBAR INTERBODY FUSION AND DECOMPRESSION WITH INSTRUMENTATION AND ALLOGRAFT;  Surgeon: Estill Bamberg, MD;  Location: [facility] OR;  Service: Orthopedics;  Laterality: Left;   VIDEO BRONCHOSCOPY N/A 11/27/2022   Procedure: VIDEO BRONCHOSCOPY WITH FLUORO;  Surgeon: Leslye Peer, MD;  Location: [facility] ENDOSCOPY;  Service: Cardiopulmonary;  Laterality: N/A;    Patient Active Problem List   Diagnosis Date Noted   Radiculopathy of lumbar region 05/17/2023   CLL (chronic lymphocytic leukemia) (HCC) 02/21/2023   Abnormal CT of the chest 11/01/2022   Melanoma in situ of back (HCC) 03/16/2020   Squamous cell carcinoma in situ 08/21/2019   Vitamin D deficiency 05/29/2019   Cough 01/01/2016   PCP NOTES >>>> 09/28/2015   Hypogonadism in male 05/13/2015   SIADH (syndrome of inappropriate ADH production) (HCC) 04/13/2013   Memory problem 04/13/2013   Annual physical exam 03/06/2012   Idiopathic scoliosis and kyphoscoliosis 08/18/2010   BACK PAIN, LUMBAR 07/21/2010   Anxiety state 04/01/2009   Essential hypertension, benign 01/04/2009   INSOMNIA-SLEEP DISORDER-UNSPEC 09/08/2008    PCP: Wanda Plump, MD   REFERRING PROVIDER: Estill Bamberg, MD   REFERRING DIAG: S32.010A (ICD-10-CM) - Wedge compression fracture of first lumbar vertebra, initial encounter for closed fracture   Rationale for Evaluation and Treatment: Rehabilitation  THERAPY DIAG:  Other low back pain  Other abnormalities of gait and mobility  Muscle weakness (generalized)  Spinal stenosis of lumbar region with neurogenic claudication  ONSET DATE: s/p L L4-5 transforaminal lumbar interbody fusion and decompression, with instrumentation and allograft on 05/17/2023  SUBJECTIVE:  SUBJECTIVE STATEMENT:  Pt denies any pain. States feels better after each session, just a little stiff in am in back when he wakes up  Eval: Patient had surgery about 12 weeks ago. Had to wear TLSO for 6 weeks, then x-rays, then just had to wear the LSO, now does not have to wear any brace.  Did not have any PT during that time.  He had weight restrictions etc, to make sure healed, has one more follow-up for X-rays, but  then should be released.  He reports that the pain disappeared right after surgery.  Surgeon wants him to walk more.  He had previous PT to strengthen back prior to surgery.    PERTINENT HISTORY:  Chronic lymphocytic leukemia, s/p L L4-5 transforaminal lumbar interbody fusion and decompression, with instrumentation and allograft on 05/17/2023, SIADH, HTN, Anxiety and Depression.  PAIN:  Are you having pain? Yes: NPRS scale: 0/10 Pain location: low back Pain description: just a feeling, not really pain Aggravating factors: sitting too much Relieving factors: walking  PRECAUTIONS: Back  no lifting more than ~ 20lbs   RED FLAGS: None   WEIGHT BEARING RESTRICTIONS: No  FALLS:  Has patient fallen in last 6 months? No  LIVING ENVIRONMENT: Lives with: lives with their spouse Lives in: House/apartment Stairs: Yes: Internal: 11 steps; can reach both and External: 5 steps; on right going up Has following equipment at home: None  OCCUPATION: works at desk, on computer most of the day  PLOF: Independent; likes fly fishing  PATIENT GOALS: get back muscles stronger, walk more, get back to fly fishing and biking  NEXT MD VISIT: beginning of November  OBJECTIVE:   DIAGNOSTIC FINDINGS:  Result Date: 05/17/2023 CLINICAL DATA:  Elective surgery. EXAM: LUMBAR SPINE - 2-3 VIEW COMPARISON:  None Available. FINDINGS: Two fluoroscopic spot views of the lumbar spine obtained in the operating room in frontal and lateral projections. Posterior rod with intrapedicular screw fusion and interbody spacer at L4-L5. Fluoroscopy time 46 seconds. Dose 28.96 mGy. IMPRESSION: Intraoperative fluoroscopy during L4-L5 fusion  PATIENT SURVEYS:  Modified Oswestry 6/50   COGNITION: Overall cognitive status: Within functional limits for tasks assessed     SENSATION: WFL  MUSCLE LENGTH: Hamstrings: Right 140 deg; Left 130 deg  POSTURE: flexed trunk ~ 15 deg walking   LUMBAR ROM:   AROM eval  Flexion 85  tight hamstrings  Extension 30   Right lateral flexion To knees  Left lateral flexion To knees  Right rotation WNL  Left rotation WNL   (Blank rows = not tested)  LOWER EXTREMITY ROM:   very good hip mobility for both ER and IR, symmetric and pain free   LOWER EXTREMITY MMT:    MMT Right eval Left eval  Hip flexion 5 5  Hip extension 4+ 4+  Hip abduction 5 5  Hip adduction 5 5  Knee flexion 5 5  Knee extension 5 5  Ankle dorsiflexion 5 5  Ankle plantarflexion 5 5   (Blank rows = not tested)  FUNCTIONAL TESTS:  6 minute walk test: 1025' Functional gait assessment: 22/30  GAIT: Distance walked: 57' Assistive device utilized: None Level of assistance: Complete Independence Comments: maintains forward flexion   TODAY'S TREATMENT:  DATE:  09/11/23: Therapeutic exercise: Recumbent bicycle, 6 min Forward step ups 6" with L knee drivers Quadriped alt shoulder ext Quadriped alt hip ext Quadriped alt arm and leg ext Leg press with BATCA 45#, 2 sets 10 but pain L post knee so discontinued B Knee ext with BATCA 55#, 3 x 15 B knee flexion BATCA 45# 15 reps x 3 Side stepping along counter, 10' with green t band around thighs, cues to maintain upright standing posture to engage hip stabilizers B heel drops off step, 15x, cues for full ROM both directions Tandem standing, 30 sec holds, alt lead foot Standing cable hip ext with 5# 2 x 10 each leg    09/07/23 Therapeutic Exercise: to improve strength and mobility.  Demo, verbal and tactile cues throughout for technique. Bike L3x17min Step ups no UE support on 8' step x 10 BLE Lateral step ups no UE support on 8' step x 10 BLE Standing shoulder extension 20# x 12  Pallof press 10# x 10 bil RDLs with 10# kettlebell x 10  09/04/23 Therapeutic Exercise: to improve strength and mobility.  Demo, verbal and  tactile cues throughout for technique. Bike L3 x 6 min  Step ups (2 risers) x 10 bil  2 UE support for balance Side step ups (1 riser) x 10 bil - 1 UE support BATCA rows 20# x 20 high bars, x 20 low bars BATCA hamstring curls 25# x 20 BATCA leg extension 25# x 20 BATCA standing lat pulls 20# x 15 RDLs with 10# kettlebell - knees to thighs, cues for form, x 10 no pain Review of HEP    08/30/23: therex: instructed and progressed in the following to improve pts core strength, balance and endurance: Bike 6 min level 3 Prone with pillow under abdomen for  B TKE's, 5 sec holds, 15 reps " Alt hip and knee ext Quadruped alt shoulder flex Seated B hamstring curls 35#, 10x 3 Seated B knee ext 15#, 15 x 2 Standing tandem, 30 sec holds, alt lead foot B heel drops/toe raises off step with b UE support 20 reps High marching, emphasis on thighs ll to floor, 15x each leg, alternating   08/28/2023 Therapeutic Exercise: to improve strength and mobility.  Demo, verbal and tactile cues throughout for technique. Bike L2 x 6 min  PPT x 10  Bridges 2  x 10  SLR 2 x 10 bil - noted decreased all back pain Bent knee fall outs with TrA x 10 each  Therapeutic Activity:   FGA 22/30    08/22/23 - 1025 ft for warm up - cues to stand upright and increase heel strike Sit to stand x 10 Gastroc stretch 2x 30 sec B Post pelvic tilt 5 sec x 10 Bridge x 10 Supine SLR x 10 B cues to keep quads engaged (L difficult) Dynamic HS stretch x 12 B Worked on TA contraction and trying to isolate from general abs. Used various cues including pelvic floor. Patient able to engage but has difficulty isolating.  08/21/23 Self Care: Findings, POC, initial HEP Bridges x 10 - discomfort, after cues for TrA bracing - no pain Dynamic hamstring stretches x 10 each side Supine SLR x 10  SLR x 10 bil      PATIENT EDUCATION:  Education details: HEP review Person educated: Patient Education method: Programmer, multimedia,  Demonstration, Verbal cues, and Handouts Education comprehension: verbalized understanding and returned demonstration  HOME EXERCISE PROGRAM: Access Code: [email or web address] URL: [email or web address] Date: 09/04/2023 Prepared by:  Harrie Foreman  Exercises - Supine Posterior Pelvic Tilt  - 1-2 x daily - 3 x weekly - 2 sets - 10 reps - 5 sec hold - Supine Bridge  - 1 x daily - 3 x weekly - 2 sets - 10 reps - Supine Active Straight Leg Raise  - 1 x daily - 3 x weekly - 2 sets - 10 reps - Bent Knee Fallouts  - 1 x daily - 3 x weekly - 2 sets - 10 reps - Supine 90/90 Sciatic Nerve Glide with Knee Flexion/Extension  - 1 x daily - 3 x weekly - 1 sets - 10 reps - Bird Dog Progression  - 1 x daily - 3 x weekly - 2 sets - 10 reps - Forward Step Up  - 1 x daily - 3 x weekly - 2 sets - 10 reps - Lateral Step Up with Counter Support  - 1 x daily - 3 x weekly - 2 sets - 10 reps - [place] Deadlift With Barbell  - 1 x daily - 3 x weekly - 2 sets - 10 reps- to do from thigh to knee with light weight only  ASSESSMENT:  CLINICAL IMPRESSION: Pt is 4 months post surgery now for L4/5 fusion.  He has excellent endurance and has regained good strength in his ant hips, quads.  Biggest issues are balance and posterior chain motor control/strength.  Resumed quadriped ex today for motor control/ stability recruitment today.  Also progressed B hip extension strengthening.  He is to complete skilled PT in one month and should transition to fitness program well at that time.[name] continues to demonstrate potential for improvement and would benefit from continued skilled therapy to address impairments.     OBJECTIVE IMPAIRMENTS: Abnormal gait, decreased activity tolerance, decreased endurance, difficulty walking, decreased ROM, decreased strength, impaired flexibility, postural dysfunction, and pain.   ACTIVITY LIMITATIONS: carrying, lifting, bending, standing, and locomotion level  PARTICIPATION  LIMITATIONS: community activity and yard work  PERSONAL FACTORS: Time since onset of injury/illness/exacerbation and 1-2 comorbidities: Chronic lymphocytic leukemia, s/p L L4-5 transforaminal lumbar interbody fusion and decompression, with instrumentation and allograft on 05/17/2023, SIADH, HTN, Anxiety and Depression.  are also affecting patient's functional outcome.   REHAB POTENTIAL: Good  CLINICAL DECISION MAKING: Evolving/moderate complexity  EVALUATION COMPLEXITY: Moderate   GOALS: Goals reviewed with patient? Yes  SHORT TERM GOALS: Target date: 09/04/2023   Patient will be independent with initial HEP.  Baseline: given Goal status: MET 09/04/23   LONG TERM GOALS: Target date: 10/16/2023   Patient will be independent with advanced/ongoing HEP to improve outcomes and carryover.  Baseline:  Goal status: IN PROGRESS 08/28/23- reviewed  2.  Patient demonstrate improved posture with gait.    Baseline: forward flexed posture with gait ~ 15 deg Goal status: IN PROGRESS 09/04/23- upright, no longer shuffling  3.  Patient will demonstrate improved hip extensor strength to 5/5.  Baseline: see objective Goal status: IN PROGRESS  4.  Patient will be able to lift 20lbs safely with good lifting technique to avoid lumbar strain to perform household ADLS. Baseline: has been under lifting restrictions.  Goal status: IN PROGRESS 09/04/23- 10# today without pain  5.  Patient will report at least 6 points improvement on modified Oswestry to demonstrate improved functional ability.  Baseline: 6/50 Goal status: IN PROGRESS   6.  Patient will be able to return to all activities without limitation from LBP. Baseline: has stopped fishing Goal status: IN PROGRESS PLAN:  PT FREQUENCY: 1-2x/week  PT DURATION: 8 weeks  PLANNED INTERVENTIONS: Therapeutic exercises, Therapeutic activity, Neuromuscular re-education, Balance training, Gait training, Patient/Family education, Self Care, Joint  mobilization, Stair training, Orthotic/Fit training, Dry Needling, Electrical stimulation, Spinal manipulation, Spinal mobilization, Cryotherapy, Moist heat, Taping, Ultrasound, Ionotophoresis 4mg /ml Dexamethasone, Manual therapy, and Re-evaluation.  PLAN FOR NEXT SESSION: Continue core strengthening exercises, balance, posterior chain strengthening.  Darl Kuss L Lennox Leikam, PT, DPT, OCS 09/11/2023, 12:04 PM

## 2023-09-14 ENCOUNTER — Ambulatory Visit: Payer: Medicare HMO | Admitting: Physical Therapy

## 2023-09-14 ENCOUNTER — Encounter: Payer: Self-pay | Admitting: Physical Therapy

## 2023-09-14 DIAGNOSIS — M6281 Muscle weakness (generalized): Secondary | ICD-10-CM

## 2023-09-14 DIAGNOSIS — M5459 Other low back pain: Secondary | ICD-10-CM

## 2023-09-14 DIAGNOSIS — R2689 Other abnormalities of gait and mobility: Secondary | ICD-10-CM

## 2023-09-14 NOTE — Therapy (Signed)
OUTPATIENT PHYSICAL THERAPY TREATMENT   Patient Name: Bobby [name] MRN: [id number] DOB:1951-02-25, 72 y.o., male Today's Date: 09/14/2023  END OF SESSION:  PT End of Session - 09/14/23 1040     Visit Number 8    Date for PT Re-Evaluation 10/16/23    Authorization Type [employer] MCR    Progress Note Due on Visit 10    PT Start Time 1033    PT Stop Time 1100    PT Time Calculation (min) 27 min    Activity Tolerance Patient tolerated treatment well    Behavior During Therapy [facility] for tasks assessed/performed                Past Medical History:  Diagnosis Date   Anxiety and depression    Hypertension    Hyponatremia 03/2013   ongoing, seeing Dr. Allena Katz   Insomnia    Melanoma in situ of back [facility])    biopsy on 03/05/2020   SIADH (syndrome of inappropriate ADH production) (HCC) 2017   Dr. Allena Katz   Squamous cell carcinoma in situ    Past Surgical History:  Procedure Laterality Date   BRONCHIAL BIOPSY  11/27/2022   Procedure: BRONCHIAL BIOPSIES;  Surgeon: Leslye Peer, MD;  Location: [facility] ENDOSCOPY;  Service: Cardiopulmonary;;   BRONCHIAL BRUSHINGS  11/27/2022   Procedure: BRONCHIAL BRUSHINGS;  Surgeon: Leslye Peer, MD;  Location: [facility] ENDOSCOPY;  Service: Cardiopulmonary;;   BRONCHIAL WASHINGS  11/27/2022   Procedure: BRONCHIAL WASHINGS;  Surgeon: Leslye Peer, MD;  Location: [name];  Service: Cardiopulmonary;;   MOHS SURGERY  ~ 04/2020   face, [facility]   SHOULDER SURGERY  1980s   right shoulder   TRANSFORAMINAL LUMBAR INTERBODY FUSION (TLIF) WITH PEDICLE SCREW FIXATION 1 LEVEL Left 05/17/2023   Procedure: LEFT-SIDED LUMBAR 4- LUMBAR 5 TRANSFORAMINAL LUMBAR INTERBODY FUSION AND DECOMPRESSION WITH INSTRUMENTATION AND ALLOGRAFT;  Surgeon: Estill Bamberg, MD;  Location: [facility] OR;  Service: Orthopedics;  Laterality: Left;   VIDEO BRONCHOSCOPY N/A 11/27/2022   Procedure: VIDEO BRONCHOSCOPY WITH FLUORO;  Surgeon: Leslye Peer, MD;  Location: [facility] ENDOSCOPY;  Service:  Cardiopulmonary;  Laterality: N/A;   Patient Active Problem List   Diagnosis Date Noted   Radiculopathy of lumbar region 05/17/2023   CLL (chronic lymphocytic leukemia) (HCC) 02/21/2023   Abnormal CT of the chest 11/01/2022   Melanoma in situ of back (HCC) 03/16/2020   Squamous cell carcinoma in situ 08/21/2019   Vitamin D deficiency 05/29/2019   Cough 01/01/2016   PCP NOTES >>>> 09/28/2015   Hypogonadism in male 05/13/2015   SIADH (syndrome of inappropriate ADH production) (HCC) 04/13/2013   Memory problem 04/13/2013   Annual physical exam 03/06/2012   Idiopathic scoliosis and kyphoscoliosis 08/18/2010   BACK PAIN, LUMBAR 07/21/2010   Anxiety state 04/01/2009   Essential hypertension, benign 01/04/2009   INSOMNIA-SLEEP DISORDER-UNSPEC 09/08/2008    PCP: Wanda Plump, MD   REFERRING PROVIDER: Estill Bamberg, MD   REFERRING DIAG: S32.010A (ICD-10-CM) - Wedge compression fracture of first lumbar vertebra, initial encounter for closed fracture   Rationale for Evaluation and Treatment: Rehabilitation  THERAPY DIAG:  Other low back pain  Other abnormalities of gait and mobility  Muscle weakness (generalized)  ONSET DATE: s/p L L4-5 transforaminal lumbar interbody fusion and decompression, with instrumentation and allograft on 05/17/2023  SUBJECTIVE:  SUBJECTIVE STATEMENT:  Pt denies any pain. States feels better after each session, just a little stiff in am in back when he wakes up  Eval: Patient had surgery about 12 weeks ago. Had to wear TLSO for 6 weeks, then x-rays, then just had to wear the LSO, now does not have to wear any brace.  Did not have any PT during that time.  He had weight restrictions etc, to make sure healed, has one more follow-up for X-rays, but then should be released.  He  reports that the pain disappeared right after surgery.  Surgeon wants him to walk more.  He had previous PT to strengthen back prior to surgery.    PERTINENT HISTORY:  Chronic lymphocytic leukemia, s/p L L4-5 transforaminal lumbar interbody fusion and decompression, with instrumentation and allograft on 05/17/2023, SIADH, HTN, Anxiety and Depression.  PAIN:  Are you having pain? Yes: NPRS scale: 0/10 Pain location: low back Pain description: just a feeling, not really pain Aggravating factors: sitting too much Relieving factors: walking  PRECAUTIONS: Back  no lifting more than ~ 20lbs   RED FLAGS: None   WEIGHT BEARING RESTRICTIONS: No  FALLS:  Has patient fallen in last 6 months? No  LIVING ENVIRONMENT: Lives with: lives with their spouse Lives in: House/apartment Stairs: Yes: Internal: 11 steps; can reach both and External: 5 steps; on right going up Has following equipment at home: None  OCCUPATION: works at desk, on computer most of the day  PLOF: Independent; likes fly fishing  PATIENT GOALS: get back muscles stronger, walk more, get back to fly fishing and biking  NEXT MD VISIT: beginning of November  OBJECTIVE:   DIAGNOSTIC FINDINGS:  Result Date: 05/17/2023 CLINICAL DATA:  Elective surgery. EXAM: LUMBAR SPINE - 2-3 VIEW COMPARISON:  None Available. FINDINGS: Two fluoroscopic spot views of the lumbar spine obtained in the operating room in frontal and lateral projections. Posterior rod with intrapedicular screw fusion and interbody spacer at L4-L5. Fluoroscopy time 46 seconds. Dose 28.96 mGy. IMPRESSION: Intraoperative fluoroscopy during L4-L5 fusion  PATIENT SURVEYS:  Modified Oswestry 6/50   COGNITION: Overall cognitive status: Within functional limits for tasks assessed     SENSATION: WFL  MUSCLE LENGTH: Hamstrings: Right 140 deg; Left 130 deg  POSTURE: flexed trunk ~ 15 deg walking   LUMBAR ROM:   AROM eval  Flexion 85 tight hamstrings  Extension  30   Right lateral flexion To knees  Left lateral flexion To knees  Right rotation WNL  Left rotation WNL   (Blank rows = not tested)  LOWER EXTREMITY ROM:   very good hip mobility for both ER and IR, symmetric and pain free   LOWER EXTREMITY MMT:    MMT Right eval Left eval  Hip flexion 5 5  Hip extension 4+ 4+  Hip abduction 5 5  Hip adduction 5 5  Knee flexion 5 5  Knee extension 5 5  Ankle dorsiflexion 5 5  Ankle plantarflexion 5 5   (Blank rows = not tested)  FUNCTIONAL TESTS:  6 minute walk test: 1025' Functional gait assessment: 22/30  GAIT: Distance walked: 87' Assistive device utilized: None Level of assistance: Complete Independence Comments: maintains forward flexion   TODAY'S TREATMENT:  DATE:   09/14/23 Therapeutic Exercise: to improve strength and mobility.  Demo, verbal and tactile cues throughout for technique. Gait x 5 min warm-up - cues needed for L heel strike, shuffling and off balance today BATCA Leg extension 45# 2 x 15 BATCA Hamstring curls 45# x 15, 50# x 15  Seated paloff press GTB x 20 each direction Seated reaches orange weighted ball x 20 Standing with 1 foot on stool with yellow weight ball diagonal chops x 10 each side - SBA for safety.  -after session completed x 10 min recumbent bike L4  09/11/23: Therapeutic exercise: Recumbent bicycle, 6 min Forward step ups 6" with L knee drivers Quadriped alt shoulder ext Quadriped alt hip ext Quadriped alt arm and leg ext Leg press with BATCA 45#, 2 sets 10 but pain L post knee so discontinued B Knee ext with BATCA 55#, 3 x 15 B knee flexion BATCA 45# 15 reps x 3 Side stepping along counter, 10' with green t band around thighs, cues to maintain upright standing posture to engage hip stabilizers B heel drops off step, 15x, cues for full ROM both directions Tandem  standing, 30 sec holds, alt lead foot Standing cable hip ext with 5# 2 x 10 each leg  09/07/23 Therapeutic Exercise: to improve strength and mobility.  Demo, verbal and tactile cues throughout for technique. Bike L3x43min Step ups no UE support on 8' step x 10 BLE Lateral step ups no UE support on 8' step x 10 BLE Standing shoulder extension 20# x 12  Pallof press 10# x 10 bil RDLs with 10# kettlebell x 10   PATIENT EDUCATION:  Education details: HEP review Person educated: Patient Education method: Programmer, multimedia, Demonstration, Verbal cues, and Handouts Education comprehension: verbalized understanding and returned demonstration  HOME EXERCISE PROGRAM: Access Code: [email or web address] URL: [email or web address] Date: 09/04/2023 Prepared by: Harrie Foreman  Exercises - Supine Posterior Pelvic Tilt  - 1-2 x daily - 3 x weekly - 2 sets - 10 reps - 5 sec hold - Supine Bridge  - 1 x daily - 3 x weekly - 2 sets - 10 reps - Supine Active Straight Leg Raise  - 1 x daily - 3 x weekly - 2 sets - 10 reps - Bent Knee Fallouts  - 1 x daily - 3 x weekly - 2 sets - 10 reps - Supine 90/90 Sciatic Nerve Glide with Knee Flexion/Extension  - 1 x daily - 3 x weekly - 1 sets - 10 reps - Bird Dog Progression  - 1 x daily - 3 x weekly - 2 sets - 10 reps - Forward Step Up  - 1 x daily - 3 x weekly - 2 sets - 10 reps - Lateral Step Up with Counter Support  - 1 x daily - 3 x weekly - 2 sets - 10 reps - [place] Deadlift With Barbell  - 1 x daily - 3 x weekly - 2 sets - 10 reps- to do from thigh to knee with light weight only  ASSESSMENT:  CLINICAL IMPRESSION: Patient arrived over 15 min late today, did not realize it.  He was sleepy, and gait was significantly worse today compared to usual, complaining of more back pain and pain down LLE.  Focused on more seated exercises, then standing with 1 foot on stool to maintain slight lumbar flexion, at end of session reported decreased pain and improved  gait.  [name] continues to demonstrate potential for improvement and would benefit from continued skilled  therapy to address impairments.     OBJECTIVE IMPAIRMENTS: Abnormal gait, decreased activity tolerance, decreased endurance, difficulty walking, decreased ROM, decreased strength, impaired flexibility, postural dysfunction, and pain.   ACTIVITY LIMITATIONS: carrying, lifting, bending, standing, and locomotion level  PARTICIPATION LIMITATIONS: community activity and yard work  PERSONAL FACTORS: Time since onset of injury/illness/exacerbation and 1-2 comorbidities: Chronic lymphocytic leukemia, s/p L L4-5 transforaminal lumbar interbody fusion and decompression, with instrumentation and allograft on 05/17/2023, SIADH, HTN, Anxiety and Depression.  are also affecting patient's functional outcome.   REHAB POTENTIAL: Good  CLINICAL DECISION MAKING: Evolving/moderate complexity  EVALUATION COMPLEXITY: Moderate   GOALS: Goals reviewed with patient? Yes  SHORT TERM GOALS: Target date: 09/04/2023   Patient will be independent with initial HEP.  Baseline: given Goal status: MET 09/04/23   LONG TERM GOALS: Target date: 10/16/2023   Patient will be independent with advanced/ongoing HEP to improve outcomes and carryover.  Baseline:  Goal status: IN PROGRESS 08/28/23- reviewed  2.  Patient demonstrate improved posture with gait.    Baseline: forward flexed posture with gait ~ 15 deg Goal status: IN PROGRESS 09/04/23- upright, no longer shuffling  3.  Patient will demonstrate improved hip extensor strength to 5/5.  Baseline: see objective Goal status: IN PROGRESS  4.  Patient will be able to lift 20lbs safely with good lifting technique to avoid lumbar strain to perform household ADLS. Baseline: has been under lifting restrictions.  Goal status: IN PROGRESS 09/04/23- 10# today without pain  5.  Patient will report at least 6 points improvement on modified Oswestry to  demonstrate improved functional ability.  Baseline: 6/50 Goal status: IN PROGRESS   6.  Patient will be able to return to all activities without limitation from LBP. Baseline: has stopped fishing Goal status: IN PROGRESS PLAN:  PT FREQUENCY: 1-2x/week  PT DURATION: 8 weeks  PLANNED INTERVENTIONS: Therapeutic exercises, Therapeutic activity, Neuromuscular re-education, Balance training, Gait training, Patient/Family education, Self Care, Joint mobilization, Stair training, Orthotic/Fit training, Dry Needling, Electrical stimulation, Spinal manipulation, Spinal mobilization, Cryotherapy, Moist heat, Taping, Ultrasound, Ionotophoresis 4mg /ml Dexamethasone, Manual therapy, and Re-evaluation.  PLAN FOR NEXT SESSION: Continue core strengthening exercises, balance, posterior chain strengthening.  Jena Gauss, PT, DPT 09/14/2023, 12:16 PM

## 2023-09-18 ENCOUNTER — Ambulatory Visit: Payer: Medicare HMO

## 2023-09-18 ENCOUNTER — Other Ambulatory Visit: Payer: Self-pay

## 2023-09-18 DIAGNOSIS — L718 Other rosacea: Secondary | ICD-10-CM | POA: Diagnosis not present

## 2023-09-18 DIAGNOSIS — M48062 Spinal stenosis, lumbar region with neurogenic claudication: Secondary | ICD-10-CM

## 2023-09-18 DIAGNOSIS — R2689 Other abnormalities of gait and mobility: Secondary | ICD-10-CM

## 2023-09-18 DIAGNOSIS — M6281 Muscle weakness (generalized): Secondary | ICD-10-CM

## 2023-09-18 DIAGNOSIS — M5459 Other low back pain: Secondary | ICD-10-CM

## 2023-09-18 NOTE — Therapy (Signed)
OUTPATIENT PHYSICAL THERAPY TREATMENT   Patient Name: Bobby [name] MRN: [id number] DOB:08/29/1951, 72 y.o., male Today's Date: 09/18/2023  END OF SESSION:  PT End of Session - 09/18/23 1022     Visit Number 9    Date for PT Re-Evaluation 10/16/23    Authorization Type [employer] MCR    Progress Note Due on Visit 10    PT Start Time 1016    PT Stop Time 1058    PT Time Calculation (min) 42 min    Activity Tolerance Patient tolerated treatment well    Behavior During Therapy WFL for tasks assessed/performed                 Past Medical History:  Diagnosis Date   Anxiety and depression    Hypertension    Hyponatremia 03/2013   ongoing, seeing Dr. Allena Katz   Insomnia    Melanoma in situ of back [facility])    biopsy on 03/05/2020   SIADH (syndrome of inappropriate ADH production) (HCC) 2017   Dr. Allena Katz   Squamous cell carcinoma in situ    Past Surgical History:  Procedure Laterality Date   BRONCHIAL BIOPSY  11/27/2022   Procedure: BRONCHIAL BIOPSIES;  Surgeon: Leslye Peer, MD;  Location: [facility] ENDOSCOPY;  Service: Cardiopulmonary;;   BRONCHIAL BRUSHINGS  11/27/2022   Procedure: BRONCHIAL BRUSHINGS;  Surgeon: Leslye Peer, MD;  Location: [facility] ENDOSCOPY;  Service: Cardiopulmonary;;   BRONCHIAL WASHINGS  11/27/2022   Procedure: BRONCHIAL WASHINGS;  Surgeon: Leslye Peer, MD;  Location: [name];  Service: Cardiopulmonary;;   MOHS SURGERY  ~ 04/2020   face, [facility]   SHOULDER SURGERY  1980s   right shoulder   TRANSFORAMINAL LUMBAR INTERBODY FUSION (TLIF) WITH PEDICLE SCREW FIXATION 1 LEVEL Left 05/17/2023   Procedure: LEFT-SIDED LUMBAR 4- LUMBAR 5 TRANSFORAMINAL LUMBAR INTERBODY FUSION AND DECOMPRESSION WITH INSTRUMENTATION AND ALLOGRAFT;  Surgeon: Estill Bamberg, MD;  Location: [facility] OR;  Service: Orthopedics;  Laterality: Left;   VIDEO BRONCHOSCOPY N/A 11/27/2022   Procedure: VIDEO BRONCHOSCOPY WITH FLUORO;  Surgeon: Leslye Peer, MD;  Location: [facility] ENDOSCOPY;  Service:  Cardiopulmonary;  Laterality: N/A;   Patient Active Problem List   Diagnosis Date Noted   Radiculopathy of lumbar region 05/17/2023   CLL (chronic lymphocytic leukemia) (HCC) 02/21/2023   Abnormal CT of the chest 11/01/2022   Melanoma in situ of back (HCC) 03/16/2020   Squamous cell carcinoma in situ 08/21/2019   Vitamin D deficiency 05/29/2019   Cough 01/01/2016   PCP NOTES >>>> 09/28/2015   Hypogonadism in male 05/13/2015   SIADH (syndrome of inappropriate ADH production) (HCC) 04/13/2013   Memory problem 04/13/2013   Annual physical exam 03/06/2012   Idiopathic scoliosis and kyphoscoliosis 08/18/2010   BACK PAIN, LUMBAR 07/21/2010   Anxiety state 04/01/2009   Essential hypertension, benign 01/04/2009   INSOMNIA-SLEEP DISORDER-UNSPEC 09/08/2008    PCP: Wanda Plump, MD   REFERRING PROVIDER: Estill Bamberg, MD   REFERRING DIAG: S32.010A (ICD-10-CM) - Wedge compression fracture of first lumbar vertebra, initial encounter for closed fracture   Rationale for Evaluation and Treatment: Rehabilitation  THERAPY DIAG:  Other low back pain  Other abnormalities of gait and mobility  Muscle weakness (generalized)  Spinal stenosis of lumbar region with neurogenic claudication  ONSET DATE: s/p L L4-5 transforaminal lumbar interbody fusion and decompression, with instrumentation and allograft on 05/17/2023  SUBJECTIVE:  SUBJECTIVE STATEMENT:  Pt denies any pain. Mentioned a few times some balance /agility problems.  To see the surgeon on Friday Eval: Patient had surgery about 12 weeks ago. Had to wear TLSO for 6 weeks, then x-rays, then just had to wear the LSO, now does not have to wear any brace.  Did not have any PT during that time.  He had weight restrictions etc, to make sure healed, has one more  follow-up for X-rays, but then should be released.  He reports that the pain disappeared right after surgery.  Surgeon wants him to walk more.  He had previous PT to strengthen back prior to surgery.    PERTINENT HISTORY:  Chronic lymphocytic leukemia, s/p L L4-5 transforaminal lumbar interbody fusion and decompression, with instrumentation and allograft on 05/17/2023, SIADH, HTN, Anxiety and Depression.  PAIN:  Are you having pain? Yes: NPRS scale: 0/10 Pain location: low back Pain description: just a feeling, not really pain Aggravating factors: sitting too much Relieving factors: walking  PRECAUTIONS: Back  no lifting more than ~ 20lbs   RED FLAGS: None   WEIGHT BEARING RESTRICTIONS: No  FALLS:  Has patient fallen in last 6 months? No  LIVING ENVIRONMENT: Lives with: lives with their spouse Lives in: House/apartment Stairs: Yes: Internal: 11 steps; can reach both and External: 5 steps; on right going up Has following equipment at home: None  OCCUPATION: works at desk, on computer most of the day  PLOF: Independent; likes fly fishing  PATIENT GOALS: get back muscles stronger, walk more, get back to fly fishing and biking  NEXT MD VISIT: beginning of November  OBJECTIVE:   DIAGNOSTIC FINDINGS:  Result Date: 05/17/2023 CLINICAL DATA:  Elective surgery. EXAM: LUMBAR SPINE - 2-3 VIEW COMPARISON:  None Available. FINDINGS: Two fluoroscopic spot views of the lumbar spine obtained in the operating room in frontal and lateral projections. Posterior rod with intrapedicular screw fusion and interbody spacer at L4-L5. Fluoroscopy time 46 seconds. Dose 28.96 mGy. IMPRESSION: Intraoperative fluoroscopy during L4-L5 fusion  PATIENT SURVEYS:  Modified Oswestry 6/50   COGNITION: Overall cognitive status: Within functional limits for tasks assessed     SENSATION: WFL  MUSCLE LENGTH: Hamstrings: Right 140 deg; Left 130 deg  POSTURE: flexed trunk ~ 15 deg walking   LUMBAR ROM:    AROM eval  Flexion 85 tight hamstrings  Extension 30   Right lateral flexion To knees  Left lateral flexion To knees  Right rotation WNL  Left rotation WNL   (Blank rows = not tested)  LOWER EXTREMITY ROM:   very good hip mobility for both ER and IR, symmetric and pain free   LOWER EXTREMITY MMT:    MMT Right eval Left eval  Hip flexion 5 5  Hip extension 4+ 4+  Hip abduction 5 5  Hip adduction 5 5  Knee flexion 5 5  Knee extension 5 5  Ankle dorsiflexion 5 5  Ankle plantarflexion 5 5   (Blank rows = not tested)  FUNCTIONAL TESTS:  6 minute walk test: 1025' Functional gait assessment: 22/30  GAIT: Distance walked: 3' Assistive device utilized: None Level of assistance: Complete Independence Comments: maintains forward flexion   TODAY'S TREATMENT:  DATE:  09/17/23: Therex: instructed in various cardiovascular, stability, balance training ex to improve his function/motor control: Standing with 1 foot on stool with yellow weight ball diagonal chops x 10 each side - SBA for safety.  BATCA hamstring curls 45#, B concentric, with eccentric isolation R leg for return, 15 x 2 BATCA knee ext B 55# 15 x 2 Quadriped alt shoulder flex, alt hip ext, alt arm and leg ext, all 10 reps each  At stair well for knee drivers, 15 x each leg Standing for cable hip ext 5# plate, 15 x 2 B calf stretch off step, 15 sec holds 4 x  09/14/23 Therapeutic Exercise: to improve strength and mobility.  Demo, verbal and tactile cues throughout for technique. Gait x 5 min warm-up - cues needed for L heel strike, shuffling and off balance today BATCA Leg extension 45# 2 x 15 BATCA Hamstring curls 45# x 15, 50# x 15  Seated paloff press GTB x 20 each direction Seated reaches orange weighted ball x 20 Standing with 1 foot on stool with yellow weight ball diagonal chops x  10 each side - SBA for safety.  -after session completed x 10 min recumbent bike L4  09/11/23: Therapeutic exercise: Recumbent bicycle, 6 min Forward step ups 6" with L knee drivers Quadriped alt shoulder ext Quadriped alt hip ext Quadriped alt arm and leg ext Leg press with BATCA 45#, 2 sets 10 but pain L post knee so discontinued B Knee ext with BATCA 55#, 3 x 15 B knee flexion BATCA 45# 15 reps x 3 Side stepping along counter, 10' with green t band around thighs, cues to maintain upright standing posture to engage hip stabilizers B heel drops off step, 15x, cues for full ROM both directions Tandem standing, 30 sec holds, alt lead foot Standing cable hip ext with 5# 2 x 10 each leg  09/07/23 Therapeutic Exercise: to improve strength and mobility.  Demo, verbal and tactile cues throughout for technique. Bike L3x59min Step ups no UE support on 8' step x 10 BLE Lateral step ups no UE support on 8' step x 10 BLE Standing shoulder extension 20# x 12  Pallof press 10# x 10 bil RDLs with 10# kettlebell x 10   PATIENT EDUCATION:  Education details: HEP review Person educated: Patient Education method: Programmer, multimedia, Demonstration, Verbal cues, and Handouts Education comprehension: verbalized understanding and returned demonstration  HOME EXERCISE PROGRAM: Access Code: [email or web address] URL: [email or web address] Date: 09/04/2023 Prepared by: Harrie Foreman  Exercises - Supine Posterior Pelvic Tilt  - 1-2 x daily - 3 x weekly - 2 sets - 10 reps - 5 sec hold - Supine Bridge  - 1 x daily - 3 x weekly - 2 sets - 10 reps - Supine Active Straight Leg Raise  - 1 x daily - 3 x weekly - 2 sets - 10 reps - Bent Knee Fallouts  - 1 x daily - 3 x weekly - 2 sets - 10 reps - Supine 90/90 Sciatic Nerve Glide with Knee Flexion/Extension  - 1 x daily - 3 x weekly - 1 sets - 10 reps - Bird Dog Progression  - 1 x daily - 3 x weekly - 2 sets - 10 reps - Forward Step Up  - 1 x daily - 3 x  weekly - 2 sets - 10 reps - Lateral Step Up with Counter Support  - 1 x daily - 3 x weekly - 2 sets - 10 reps - [place] Deadlift With [employer]  -  1 x daily - 3 x weekly - 2 sets - 10 reps- to do from thigh to knee with light weight only  ASSESSMENT:  CLINICAL IMPRESSION: Patient OMARIAN SANDLER  is demonstrating improved pain management as well as endurance, strength.  Still has some coordination deficits and less strength R post chain as compared to L.  Also gait pattern with B forefoot contact, frequent shuffling gait.  He will see his surgeon at the end of this week and is to continue with skilled PT for 3 more weeks, at which point he will be ready for DC.  He continues to demonstrate potential for improvement and would benefit from continued skilled therapy to address impairments.     OBJECTIVE IMPAIRMENTS: Abnormal gait, decreased activity tolerance, decreased endurance, difficulty walking, decreased ROM, decreased strength, impaired flexibility, postural dysfunction, and pain.   ACTIVITY LIMITATIONS: carrying, lifting, bending, standing, and locomotion level  PARTICIPATION LIMITATIONS: community activity and yard work  PERSONAL FACTORS: Time since onset of injury/illness/exacerbation and 1-2 comorbidities: Chronic lymphocytic leukemia, s/p L L4-5 transforaminal lumbar interbody fusion and decompression, with instrumentation and allograft on 05/17/2023, SIADH, HTN, Anxiety and Depression.  are also affecting patient's functional outcome.   REHAB POTENTIAL: Good  CLINICAL DECISION MAKING: Evolving/moderate complexity  EVALUATION COMPLEXITY: Moderate   GOALS: Goals reviewed with patient? Yes  SHORT TERM GOALS: Target date: 09/04/2023   Patient will be independent with initial HEP.  Baseline: given Goal status: MET 09/04/23   LONG TERM GOALS: Target date: 10/16/2023   Patient will be independent with advanced/ongoing HEP to improve outcomes and carryover.  Baseline:  Goal  status: IN PROGRESS 08/28/23- reviewed  2.  Patient demonstrate improved posture with gait.    Baseline: forward flexed posture with gait ~ 15 deg Goal status: IN PROGRESS 09/04/23- upright, no longer shuffling  3.  Patient will demonstrate improved hip extensor strength to 5/5.  Baseline: see objective Goal status: IN PROGRESS  4.  Patient will be able to lift 20lbs safely with good lifting technique to avoid lumbar strain to perform household ADLS. Baseline: has been under lifting restrictions.  Goal status: IN PROGRESS 09/04/23- 10# today without pain  5.  Patient will report at least 6 points improvement on modified Oswestry to demonstrate improved functional ability.  Baseline: 6/50 Goal status: IN PROGRESS   6.  Patient will be able to return to all activities without limitation from LBP. Baseline: has stopped fishing Goal status: IN PROGRESS PLAN:  PT FREQUENCY: 1-2x/week  PT DURATION: 8 weeks  PLANNED INTERVENTIONS: Therapeutic exercises, Therapeutic activity, Neuromuscular re-education, Balance training, Gait training, Patient/Family education, Self Care, Joint mobilization, Stair training, Orthotic/Fit training, Dry Needling, Electrical stimulation, Spinal manipulation, Spinal mobilization, Cryotherapy, Moist heat, Taping, Ultrasound, Ionotophoresis 4mg /ml Dexamethasone, Manual therapy, and Re-evaluation.  PLAN FOR NEXT SESSION: Continue core strengthening exercises, balance, posterior chain strengthening.  Nyrie Sigal L Cobi Delph, PT, DPT, OCS 09/18/2023, 11:58 AM

## 2023-09-19 ENCOUNTER — Telehealth: Payer: Self-pay | Admitting: Emergency Medicine

## 2023-09-19 DIAGNOSIS — I10 Essential (primary) hypertension: Secondary | ICD-10-CM | POA: Diagnosis not present

## 2023-09-19 DIAGNOSIS — E871 Hypo-osmolality and hyponatremia: Secondary | ICD-10-CM | POA: Diagnosis not present

## 2023-09-19 DIAGNOSIS — E559 Vitamin D deficiency, unspecified: Secondary | ICD-10-CM | POA: Diagnosis not present

## 2023-09-19 LAB — BASIC METABOLIC PANEL
BUN: 16 (ref 4–21)
CO2: 30 — AB (ref 13–22)
Chloride: 96 — AB (ref 99–108)
Creatinine: 0.8 (ref 0.6–1.3)
Glucose: 110
Potassium: 5 meq/L (ref 3.5–5.1)
Sodium: 133 — AB (ref 137–147)

## 2023-09-19 LAB — COMPREHENSIVE METABOLIC PANEL
Albumin: 4.7 (ref 3.5–5.0)
Calcium: 9.7 (ref 8.7–10.7)
eGFR: 92

## 2023-09-19 LAB — VITAMIN D 25 HYDROXY (VIT D DEFICIENCY, FRACTURES): Vit D, 25-Hydroxy: 43.9

## 2023-09-19 NOTE — Telephone Encounter (Signed)
Atc patient vm full this will need to be resc likely

## 2023-09-19 NOTE — Telephone Encounter (Signed)
Patient never showed up to his CT Scan at [facility] but has an appointment scheduled in office with Dr.Byrum tomorrow, October 31,2024. He will need a new scan to be scheduled.

## 2023-09-19 NOTE — Telephone Encounter (Signed)
I called patient to schedule  transferred patient over to Med center to schedule

## 2023-09-19 NOTE — Telephone Encounter (Signed)
Patient would like to schedule CT scan. Patient would like CT scan scheduled at [facility]. Patient keeping appointment for tomorrow. Patient phone number is [phone].

## 2023-09-20 ENCOUNTER — Telehealth: Payer: Self-pay | Admitting: Emergency Medicine

## 2023-09-20 ENCOUNTER — Ambulatory Visit: Payer: Medicare HMO | Admitting: Emergency Medicine

## 2023-09-20 NOTE — Telephone Encounter (Signed)
Pt needs ct scheduled. [facility] please advise.   Preference [facility]

## 2023-09-20 NOTE — Telephone Encounter (Signed)
Patient has appointment today with Dr. Delton Coombes. Patient did not have CT scan. Would like to know if he needs appointment today with Dr. Delton Coombes. Patient phone number is [phone].

## 2023-09-20 NOTE — Telephone Encounter (Signed)
Patient was transferred over to [facility] yesterday to set up his Ct I called him and transferred him over to schedule

## 2023-09-21 DIAGNOSIS — M545 Low back pain, unspecified: Secondary | ICD-10-CM | POA: Diagnosis not present

## 2023-09-25 ENCOUNTER — Ambulatory Visit: Payer: Medicare HMO | Admitting: Physical Therapy

## 2023-09-27 ENCOUNTER — Encounter: Payer: Self-pay | Admitting: Physical Therapy

## 2023-09-27 ENCOUNTER — Ambulatory Visit: Payer: Medicare HMO | Attending: Orthopedic Surgery | Admitting: Physical Therapy

## 2023-09-27 DIAGNOSIS — M6281 Muscle weakness (generalized): Secondary | ICD-10-CM | POA: Diagnosis present

## 2023-09-27 DIAGNOSIS — M48062 Spinal stenosis, lumbar region with neurogenic claudication: Secondary | ICD-10-CM

## 2023-09-27 DIAGNOSIS — R2689 Other abnormalities of gait and mobility: Secondary | ICD-10-CM | POA: Diagnosis present

## 2023-09-27 DIAGNOSIS — M5459 Other low back pain: Secondary | ICD-10-CM | POA: Diagnosis present

## 2023-09-27 NOTE — Telephone Encounter (Signed)
I have spoke to the patient and resc the appt

## 2023-09-27 NOTE — Therapy (Signed)
OUTPATIENT PHYSICAL THERAPY TREATMENT   Patient Name: Bobby [name] MRN: [id number] DOB:09/09/1951, 72 y.o., male Today's Date: 09/27/2023   Progress Note  Reporting Period 08/21/23 to 09/27/23  See note below for Objective Data and Assessment of Progress/Goals.        END OF SESSION:  PT End of Session - 09/27/23 1149     Visit Number 10    Date for PT Re-Evaluation 10/16/23    Authorization Type [employer] MCR    Progress Note Due on Visit 20    PT Start Time 1103    PT Stop Time 1135   pt took business call, unable to complete education/interventions with remaining session time after   PT Time Calculation (min) 32 min    Activity Tolerance Patient tolerated treatment well    Behavior During Therapy [facility] for tasks assessed/performed                  Past Medical History:  Diagnosis Date   Anxiety and depression    Hypertension    Hyponatremia 03/2013   ongoing, seeing Dr. Allena Katz   Insomnia    Melanoma in situ of back [facility])    biopsy on 03/05/2020   SIADH (syndrome of inappropriate ADH production) (HCC) 2017   Dr. Allena Katz   Squamous cell carcinoma in situ    Past Surgical History:  Procedure Laterality Date   BRONCHIAL BIOPSY  11/27/2022   Procedure: BRONCHIAL BIOPSIES;  Surgeon: Leslye Peer, MD;  Location: [facility] ENDOSCOPY;  Service: Cardiopulmonary;;   BRONCHIAL BRUSHINGS  11/27/2022   Procedure: BRONCHIAL BRUSHINGS;  Surgeon: Leslye Peer, MD;  Location: [facility] ENDOSCOPY;  Service: Cardiopulmonary;;   BRONCHIAL WASHINGS  11/27/2022   Procedure: BRONCHIAL WASHINGS;  Surgeon: Leslye Peer, MD;  Location: [name];  Service: Cardiopulmonary;;   MOHS SURGERY  ~ 04/2020   face, [facility]   SHOULDER SURGERY  1980s   right shoulder   TRANSFORAMINAL LUMBAR INTERBODY FUSION (TLIF) WITH PEDICLE SCREW FIXATION 1 LEVEL Left 05/17/2023   Procedure: LEFT-SIDED LUMBAR 4- LUMBAR 5 TRANSFORAMINAL LUMBAR INTERBODY FUSION AND DECOMPRESSION WITH INSTRUMENTATION AND ALLOGRAFT;   Surgeon: Estill Bamberg, MD;  Location: [facility] OR;  Service: Orthopedics;  Laterality: Left;   VIDEO BRONCHOSCOPY N/A 11/27/2022   Procedure: VIDEO BRONCHOSCOPY WITH FLUORO;  Surgeon: Leslye Peer, MD;  Location: [facility] ENDOSCOPY;  Service: Cardiopulmonary;  Laterality: N/A;   Patient Active Problem List   Diagnosis Date Noted   Radiculopathy of lumbar region 05/17/2023   CLL (chronic lymphocytic leukemia) (HCC) 02/21/2023   Abnormal CT of the chest 11/01/2022   Melanoma in situ of back (HCC) 03/16/2020   Squamous cell carcinoma in situ 08/21/2019   Vitamin D deficiency 05/29/2019   Cough 01/01/2016   PCP NOTES >>>> 09/28/2015   Hypogonadism in male 05/13/2015   SIADH (syndrome of inappropriate ADH production) (HCC) 04/13/2013   Memory problem 04/13/2013   Annual physical exam 03/06/2012   Idiopathic scoliosis and kyphoscoliosis 08/18/2010   BACK PAIN, LUMBAR 07/21/2010   Anxiety state 04/01/2009   Essential hypertension, benign 01/04/2009   INSOMNIA-SLEEP DISORDER-UNSPEC 09/08/2008    PCP: Wanda Plump, MD   REFERRING PROVIDER: Estill Bamberg, MD   REFERRING DIAG: S32.010A (ICD-10-CM) - Wedge compression fracture of first lumbar vertebra, initial encounter for closed fracture   Rationale for Evaluation and Treatment: Rehabilitation  THERAPY DIAG:  Other low back pain  Other abnormalities of gait and mobility  Muscle weakness (generalized)  Spinal stenosis of lumbar region with  neurogenic claudication  ONSET DATE: s/p L L4-5 transforaminal lumbar interbody fusion and decompression, with instrumentation and allograft on 05/17/2023  SUBJECTIVE:                                                                                                                                                                                           SUBJECTIVE STATEMENT:    I'm feeling good, I've gotten back to biking for about 8 miles, also walking about 6-8K steps. Not having any pain really. Driving  for a long time like 3-4 hours can still be uncomfortable but more in the knee. Feel like I still need to work on muscle strength especially around my back and hamstring, hamstring is weak and I have to use clips on the bike to stay on the pedals which I don't like to do in the case of a wreck. I'm concerned about endurance and balance too.   PERTINENT HISTORY:  Chronic lymphocytic leukemia, s/p L L4-5 transforaminal lumbar interbody fusion and decompression, with instrumentation and allograft on 05/17/2023, SIADH, HTN, Anxiety and Depression.  PAIN:  Are you having pain? No 0/10 now   PRECAUTIONS: Back  no lifting more than 30# (updated 09/27/23)   RED FLAGS: None   WEIGHT BEARING RESTRICTIONS: No  FALLS:  Has patient fallen in last 6 months? No  LIVING ENVIRONMENT: Lives with: lives with their spouse Lives in: House/apartment Stairs: Yes: Internal: 11 steps; can reach both and External: 5 steps; on right going up Has following equipment at home: None  OCCUPATION: works at desk, on computer most of the day  PLOF: Independent; likes fly fishing  PATIENT GOALS: get back muscles stronger, walk more, get back to fly fishing and biking  NEXT MD VISIT: beginning of November  OBJECTIVE:   DIAGNOSTIC FINDINGS:  Result Date: 05/17/2023 CLINICAL DATA:  Elective surgery. EXAM: LUMBAR SPINE - 2-3 VIEW COMPARISON:  None Available. FINDINGS: Two fluoroscopic spot views of the lumbar spine obtained in the operating room in frontal and lateral projections. Posterior rod with intrapedicular screw fusion and interbody spacer at L4-L5. Fluoroscopy time 46 seconds. Dose 28.96 mGy. IMPRESSION: Intraoperative fluoroscopy during L4-L5 fusion  PATIENT SURVEYS:  Modified Oswestry 6/50; 09/27/23- 5/50    COGNITION: Overall cognitive status: Within functional limits for tasks assessed     SENSATION: WFL  MUSCLE LENGTH: Hamstrings: Right 140 deg; Left 130 deg  POSTURE: flexed trunk ~ 15 deg  walking   LUMBAR ROM:   AROM eval  Flexion 85 tight hamstrings  Extension 30   Right lateral flexion To knees  Left lateral flexion To knees  Right rotation WNL  Left rotation WNL   (Blank rows = not tested)  LOWER EXTREMITY ROM:   very good hip mobility for both ER and IR, symmetric and pain free   LOWER EXTREMITY MMT:    MMT Right eval Left eval Right 09/27/23 Left 09/27/23  Hip flexion 5 5 5 5   Hip extension 4+ 4+ 3 3  Hip abduction 5 5 3 3   Hip adduction 5 5    Knee flexion 5 5 5 5   Knee extension 5 5 5 5   Ankle dorsiflexion 5 5    Ankle plantarflexion 5 5     (Blank rows = not tested)  FUNCTIONAL TESTS:  6 minute walk test: 1025' Functional gait assessment: 22/30; 09/27/23 20/30   GAIT: Distance walked: 11' Assistive device utilized: None Level of assistance: Complete Independence Comments: maintains forward flexion   TODAY'S TREATMENT:                                                                                                                              DATE:   09/27/23  Modified Oswestry/discussion regarding progress and goals/objective measures as above for MCR progress note   Session limited as patient took business phone call in the middle of session, unable to complete formal PT education and interventions        09/17/23: Therex: instructed in various cardiovascular, stability, balance training ex to improve his function/motor control: Standing with 1 foot on stool with yellow weight ball diagonal chops x 10 each side - SBA for safety.  BATCA hamstring curls 45#, B concentric, with eccentric isolation R leg for return, 15 x 2 BATCA knee ext B 55# 15 x 2 Quadriped alt shoulder flex, alt hip ext, alt arm and leg ext, all 10 reps each  At stair well for knee drivers, 15 x each leg Standing for cable hip ext 5# plate, 15 x 2 B calf stretch off step, 15 sec holds 4 x  09/14/23 Therapeutic Exercise: to improve strength and mobility.  Demo,  verbal and tactile cues throughout for technique. Gait x 5 min warm-up - cues needed for L heel strike, shuffling and off balance today BATCA Leg extension 45# 2 x 15 BATCA Hamstring curls 45# x 15, 50# x 15  Seated paloff press GTB x 20 each direction Seated reaches orange weighted ball x 20 Standing with 1 foot on stool with yellow weight ball diagonal chops x 10 each side - SBA for safety.  -after session completed x 10 min recumbent bike L4  09/11/23: Therapeutic exercise: Recumbent bicycle, 6 min Forward step ups 6" with L knee drivers Quadriped alt shoulder ext Quadriped alt hip ext Quadriped alt arm and leg ext Leg press with BATCA 45#, 2 sets 10 but pain L post knee so discontinued B Knee ext with BATCA 55#, 3 x 15 B knee flexion BATCA 45# 15 reps x 3 Side stepping along counter, 10' with green t band around thighs, cues  to maintain upright standing posture to engage hip stabilizers B heel drops off step, 15x, cues for full ROM both directions Tandem standing, 30 sec holds, alt lead foot Standing cable hip ext with 5# 2 x 10 each leg  09/07/23 Therapeutic Exercise: to improve strength and mobility.  Demo, verbal and tactile cues throughout for technique. Bike L3x43min Step ups no UE support on 8' step x 10 BLE Lateral step ups no UE support on 8' step x 10 BLE Standing shoulder extension 20# x 12  Pallof press 10# x 10 bil RDLs with 10# kettlebell x 10   PATIENT EDUCATION:  Education details: HEP review Person educated: Patient Education method: Programmer, multimedia, Demonstration, Verbal cues, and Handouts Education comprehension: verbalized understanding and returned demonstration  HOME EXERCISE PROGRAM: Access Code: [email or web address] URL: [email or web address] Date: 09/04/2023 Prepared by: Harrie Foreman  Exercises - Supine Posterior Pelvic Tilt  - 1-2 x daily - 3 x weekly - 2 sets - 10 reps - 5 sec hold - Supine Bridge  - 1 x daily - 3 x weekly - 2 sets - 10  reps - Supine Active Straight Leg Raise  - 1 x daily - 3 x weekly - 2 sets - 10 reps - Bent Knee Fallouts  - 1 x daily - 3 x weekly - 2 sets - 10 reps - Supine 90/90 Sciatic Nerve Glide with Knee Flexion/Extension  - 1 x daily - 3 x weekly - 1 sets - 10 reps - Bird Dog Progression  - 1 x daily - 3 x weekly - 2 sets - 10 reps - Forward Step Up  - 1 x daily - 3 x weekly - 2 sets - 10 reps - Lateral Step Up with Counter Support  - 1 x daily - 3 x weekly - 2 sets - 10 reps - [place] Deadlift With Barbell  - 1 x daily - 3 x weekly - 2 sets - 10 reps- to do from thigh to knee with light weight only  ASSESSMENT:  CLINICAL IMPRESSION:  Pt arrives doing well, it sounds like he is very pleased with his progress thus far but does relate that he is concerned about his balance, endurance, and lumbar mm and hamstring strength. Very pleasant but extremely talkative and session was thus very limited. Extended POC as appropriate, will focus on targeted concerns with primary goal of moving towards DC to advanced HEP/gym program in the near future.    OBJECTIVE IMPAIRMENTS: Abnormal gait, decreased activity tolerance, decreased endurance, difficulty walking, decreased ROM, decreased strength, impaired flexibility, postural dysfunction, and pain.   ACTIVITY LIMITATIONS: carrying, lifting, bending, standing, and locomotion level  PARTICIPATION LIMITATIONS: community activity and yard work  PERSONAL FACTORS: Time since onset of injury/illness/exacerbation and 1-2 comorbidities: Chronic lymphocytic leukemia, s/p L L4-5 transforaminal lumbar interbody fusion and decompression, with instrumentation and allograft on 05/17/2023, SIADH, HTN, Anxiety and Depression.  are also affecting patient's functional outcome.   REHAB POTENTIAL: Good  CLINICAL DECISION MAKING: Evolving/moderate complexity  EVALUATION COMPLEXITY: Moderate   GOALS: Goals reviewed with patient? Yes  SHORT TERM GOALS: Target date: 09/04/2023    Patient will be independent with initial HEP.  Baseline: given Goal status: MET 09/04/23   LONG TERM GOALS: Target date: 10/16/2023   Patient will be independent with advanced/ongoing HEP to improve outcomes and carryover.  Baseline:  Goal status: IN PROGRESS 08/28/23- reviewed  2.  Patient demonstrate improved posture with gait.    Baseline: forward flexed posture with gait ~ 15 deg  Goal status: IN PROGRESS 09/27/23- ongoing trunk flexion   3.  Patient will demonstrate improved hip extensor  and abductor strength to 5/5.  Baseline: see objective Goal status: IN PROGRESS 09/27/23- very weak 3/5 B  4.  Patient will be able to lift 20lbs safely with good lifting technique to avoid lumbar strain to perform household ADLS. Baseline: has been under lifting restrictions.  Goal status: IN PROGRESS 09/27/23- DNT but reports ongoing difficulty with lifting mechanics on his own   5.  Patient will report at least 6 points improvement on modified Oswestry to demonstrate improved functional ability.  Baseline: 6/50 Goal status: IN PROGRESS 09/27/23 5/50  6.  Patient will be able to return to all activities without limitation from LBP. Baseline: has stopped fishing Goal status: IN PROGRESS 09/27/23 PLAN:  PT FREQUENCY: 1-2x/week  PT DURATION: 8 weeks  PLANNED INTERVENTIONS: Therapeutic exercises, Therapeutic activity, Neuromuscular re-education, Balance training, Gait training, Patient/Family education, Self Care, Joint mobilization, Stair training, Orthotic/Fit training, Dry Needling, Electrical stimulation, Spinal manipulation, Spinal mobilization, Cryotherapy, Moist heat, Taping, Ultrasound, Ionotophoresis 4mg /ml Dexamethasone, Manual therapy, and Re-evaluation.  PLAN FOR NEXT SESSION: Continue core strengthening exercises, balance, posterior chain strengthening. Continue to challenge mm endurance and work towards advanced HEP/DC to independent program by end of POC   Nedra Hai,  PT, DPT 09/27/23 11:51 AM

## 2023-09-28 ENCOUNTER — Encounter: Payer: Self-pay | Admitting: Internal Medicine

## 2023-10-02 ENCOUNTER — Encounter: Payer: Self-pay | Admitting: Physical Therapy

## 2023-10-02 ENCOUNTER — Ambulatory Visit: Payer: Medicare HMO | Admitting: Physical Therapy

## 2023-10-02 DIAGNOSIS — M5459 Other low back pain: Secondary | ICD-10-CM

## 2023-10-02 DIAGNOSIS — M6281 Muscle weakness (generalized): Secondary | ICD-10-CM

## 2023-10-02 DIAGNOSIS — R2689 Other abnormalities of gait and mobility: Secondary | ICD-10-CM

## 2023-10-02 NOTE — Therapy (Signed)
OUTPATIENT PHYSICAL THERAPY TREATMENT   Patient Name: Bobby [name] MRN: [id number] DOB:Jun 17, 1951, 72 y.o., male Today's Date: 10/02/2023     END OF SESSION:  PT End of Session - 10/02/23 1023     Visit Number 11    Date for PT Re-Evaluation 10/16/23    Authorization Type [employer] MCR    Progress Note Due on Visit 20    PT Start Time 1020    PT Stop Time 1101    PT Time Calculation (min) 41 min    Activity Tolerance Patient tolerated treatment well    Behavior During Therapy WFL for tasks assessed/performed                  Past Medical History:  Diagnosis Date   Anxiety and depression    Hypertension    Hyponatremia 03/2013   ongoing, seeing Dr. Allena Katz   Insomnia    Melanoma in situ of back [facility])    biopsy on 03/05/2020   SIADH (syndrome of inappropriate ADH production) (HCC) 2017   Dr. Allena Katz   Squamous cell carcinoma in situ    Past Surgical History:  Procedure Laterality Date   BRONCHIAL BIOPSY  11/27/2022   Procedure: BRONCHIAL BIOPSIES;  Surgeon: Leslye Peer, MD;  Location: [facility] ENDOSCOPY;  Service: Cardiopulmonary;;   BRONCHIAL BRUSHINGS  11/27/2022   Procedure: BRONCHIAL BRUSHINGS;  Surgeon: Leslye Peer, MD;  Location: [facility] ENDOSCOPY;  Service: Cardiopulmonary;;   BRONCHIAL WASHINGS  11/27/2022   Procedure: BRONCHIAL WASHINGS;  Surgeon: Leslye Peer, MD;  Location: [name];  Service: Cardiopulmonary;;   MOHS SURGERY  ~ 04/2020   face, [facility]   SHOULDER SURGERY  1980s   right shoulder   TRANSFORAMINAL LUMBAR INTERBODY FUSION (TLIF) WITH PEDICLE SCREW FIXATION 1 LEVEL Left 05/17/2023   Procedure: LEFT-SIDED LUMBAR 4- LUMBAR 5 TRANSFORAMINAL LUMBAR INTERBODY FUSION AND DECOMPRESSION WITH INSTRUMENTATION AND ALLOGRAFT;  Surgeon: Estill Bamberg, MD;  Location: [facility] OR;  Service: Orthopedics;  Laterality: Left;   VIDEO BRONCHOSCOPY N/A 11/27/2022   Procedure: VIDEO BRONCHOSCOPY WITH FLUORO;  Surgeon: Leslye Peer, MD;  Location: [facility] ENDOSCOPY;  Service:  Cardiopulmonary;  Laterality: N/A;   Patient Active Problem List   Diagnosis Date Noted   Radiculopathy of lumbar region 05/17/2023   CLL (chronic lymphocytic leukemia) (HCC) 02/21/2023   Abnormal CT of the chest 11/01/2022   Melanoma in situ of back (HCC) 03/16/2020   Squamous cell carcinoma in situ 08/21/2019   Vitamin D deficiency 05/29/2019   Cough 01/01/2016   PCP NOTES >>>> 09/28/2015   Hypogonadism in male 05/13/2015   SIADH (syndrome of inappropriate ADH production) (HCC) 04/13/2013   Memory problem 04/13/2013   Annual physical exam 03/06/2012   Idiopathic scoliosis and kyphoscoliosis 08/18/2010   BACK PAIN, LUMBAR 07/21/2010   Anxiety state 04/01/2009   Essential hypertension, benign 01/04/2009   INSOMNIA-SLEEP DISORDER-UNSPEC 09/08/2008    PCP: Wanda Plump, MD   REFERRING PROVIDER: Estill Bamberg, MD   REFERRING DIAG: S32.010A (ICD-10-CM) - Wedge compression fracture of first lumbar vertebra, initial encounter for closed fracture   Rationale for Evaluation and Treatment: Rehabilitation  THERAPY DIAG:  Other low back pain  Other abnormalities of gait and mobility  Muscle weakness (generalized)  ONSET DATE: s/p L L4-5 transforaminal lumbar interbody fusion and decompression, with instrumentation and allograft on 05/17/2023  SUBJECTIVE:  SUBJECTIVE STATEMENT:    Doing well, feel good, no pain.    PERTINENT HISTORY:  Chronic lymphocytic leukemia, s/p L L4-5 transforaminal lumbar interbody fusion and decompression, with instrumentation and allograft on 05/17/2023, SIADH, HTN, Anxiety and Depression.  PAIN:  Are you having pain? No 0/10 now   PRECAUTIONS: Back  no lifting more than 30# (updated 09/27/23)   RED FLAGS: None   WEIGHT BEARING RESTRICTIONS: No  FALLS:  Has  patient fallen in last 6 months? No  LIVING ENVIRONMENT: Lives with: lives with their spouse Lives in: House/apartment Stairs: Yes: Internal: 11 steps; can reach both and External: 5 steps; on right going up Has following equipment at home: None  OCCUPATION: works at desk, on computer most of the day  PLOF: Independent; likes fly fishing  PATIENT GOALS: get back muscles stronger, walk more, get back to fly fishing and biking  NEXT MD VISIT: beginning of November  OBJECTIVE:   DIAGNOSTIC FINDINGS:  Result Date: 05/17/2023 CLINICAL DATA:  Elective surgery. EXAM: LUMBAR SPINE - 2-3 VIEW COMPARISON:  None Available. FINDINGS: Two fluoroscopic spot views of the lumbar spine obtained in the operating room in frontal and lateral projections. Posterior rod with intrapedicular screw fusion and interbody spacer at L4-L5. Fluoroscopy time 46 seconds. Dose 28.96 mGy. IMPRESSION: Intraoperative fluoroscopy during L4-L5 fusion  PATIENT SURVEYS:  Modified Oswestry 6/50; 09/27/23- 5/50    COGNITION: Overall cognitive status: Within functional limits for tasks assessed     SENSATION: WFL  MUSCLE LENGTH: Hamstrings: Right 140 deg; Left 130 deg  POSTURE: flexed trunk ~ 15 deg walking   LUMBAR ROM:   AROM eval  Flexion 85 tight hamstrings  Extension 30   Right lateral flexion To knees  Left lateral flexion To knees  Right rotation WNL  Left rotation WNL   (Blank rows = not tested)  LOWER EXTREMITY ROM:   very good hip mobility for both ER and IR, symmetric and pain free   LOWER EXTREMITY MMT:    MMT Right Eval Left Eval Right 09/27/23 Left 09/27/23  Hip flexion 5 5 5 5   Hip extension 4+ 4+ 3 3  Hip abduction 4* 4*  3 3  Hip adduction 5 5    Knee flexion 5 5 5 5   Knee extension 5 5 5 5   Ankle dorsiflexion 5 5    Ankle plantarflexion 5 5     (Blank rows = not tested) * seated  FUNCTIONAL TESTS:  6 minute walk test: 1025' Functional gait assessment: 22/30; 09/27/23 20/30    GAIT: Distance walked: 78' Assistive device utilized: None Level of assistance: Complete Independence Comments: maintains forward flexion   TODAY'S TREATMENT:                                                                                                                              DATE:   10/02/23 Therapeutic Exercise: to improve strength and mobility.  Demo, verbal and tactile cues  throughout for technique. Bike L3 x 7 min  SL hip abduction x 10 bil - very challenging, added to [employer] walks with GTB, 3 laps, cues for speed, CGA needed for balance Neuromuscular Reeducation: to improve balance and stability. SBA for safety throughout.  Star excursion 180 deg front to back progressing from stepping to tap to encourage SLS Attempted single leg RDL with table in front for safety, could not transition into SLS SL stance at counter x 30 sec bil - added to HEP  09/27/23  Modified Oswestry/discussion regarding progress and goals/objective measures as above for [facility] progress note   Session limited as patient took business phone call in the middle of session, unable to complete formal PT education and interventions   09/17/23: Therex: instructed in various cardiovascular, stability, balance training ex to improve his function/motor control: Standing with 1 foot on stool with yellow weight ball diagonal chops x 10 each side - SBA for safety.  BATCA hamstring curls 45#, B concentric, with eccentric isolation R leg for return, 15 x 2 BATCA knee ext B 55# 15 x 2 Quadriped alt shoulder flex, alt hip ext, alt arm and leg ext, all 10 reps each  At stair well for knee drivers, 15 x each leg Standing for cable hip ext 5# plate, 15 x 2 B calf stretch off step, 15 sec holds 4 x  09/14/23 Therapeutic Exercise: to improve strength and mobility.  Demo, verbal and tactile cues throughout for technique. Gait x 5 min warm-up - cues needed for L heel strike, shuffling and off balance today BATCA  Leg extension 45# 2 x 15 BATCA Hamstring curls 45# x 15, 50# x 15  Seated paloff press GTB x 20 each direction Seated reaches orange weighted ball x 20 Standing with 1 foot on stool with yellow weight ball diagonal chops x 10 each side - SBA for safety.  -after session completed x 10 min recumbent bike L4  09/11/23: Therapeutic exercise: Recumbent bicycle, 6 min Forward step ups 6" with L knee drivers Quadriped alt shoulder ext Quadriped alt hip ext Quadriped alt arm and leg ext Leg press with BATCA 45#, 2 sets 10 but pain L post knee so discontinued B Knee ext with BATCA 55#, 3 x 15 B knee flexion BATCA 45# 15 reps x 3 Side stepping along counter, 10' with green t band around thighs, cues to maintain upright standing posture to engage hip stabilizers B heel drops off step, 15x, cues for full ROM both directions Tandem standing, 30 sec holds, alt lead foot Standing cable hip ext with 5# 2 x 10 each leg  09/07/23 Therapeutic Exercise: to improve strength and mobility.  Demo, verbal and tactile cues throughout for technique. Bike L3x21min Step ups no UE support on 8' step x 10 BLE Lateral step ups no UE support on 8' step x 10 BLE Standing shoulder extension 20# x 12  Pallof press 10# x 10 bil RDLs with 10# kettlebell x 10   PATIENT EDUCATION:  Education details: HEP review Person educated: Patient Education method: Programmer, multimedia, Demonstration, Verbal cues, and Handouts Education comprehension: verbalized understanding and returned demonstration  HOME EXERCISE PROGRAM: Access Code: [email or web address] URL: [email or web address] Date: 10/02/2023 Prepared by: Harrie Foreman  Exercises - Supine Posterior Pelvic Tilt  - 1-2 x daily - 3 x weekly - 2 sets - 10 reps - 5 sec hold - Supine Bridge  - 1 x daily - 3 x weekly - 2 sets - 10 reps - Supine Active  Straight Leg Raise  - 1 x daily - 3 x weekly - 2 sets - 10 reps - Bent Knee Fallouts  - 1 x daily - 3 x weekly - 2 sets -  10 reps - Supine 90/90 Sciatic Nerve Glide with Knee Flexion/Extension  - 1 x daily - 3 x weekly - 1 sets - 10 reps - Bird Dog Progression  - 1 x daily - 3 x weekly - 2 sets - 10 reps - Forward Step Up  - 1 x daily - 3 x weekly - 2 sets - 10 reps - Lateral Step Up with Counter Support  - 1 x daily - 3 x weekly - 2 sets - 10 reps - [place] Deadlift With Barbell  - 1 x daily - 3 x weekly - 2 sets - 10 reps - Sidelying Hip Abduction  - 1 x daily - 7 x weekly - 3 sets - 10 reps - Standing Single Leg Stance with Counter Support  - 1 x daily - 7 x weekly - 1 sets - 3 reps - 30 sec  hold  ASSESSMENT:  CLINICAL IMPRESSION: Today continued working on strengthening, noted glute med weakness in S/L, also started working on more dynamic balance and SLS activities, very challenged, progressed HEP.  [name] continues to demonstrate potential for improvement and would benefit from continued skilled therapy to address impairments.      OBJECTIVE IMPAIRMENTS: Abnormal gait, decreased activity tolerance, decreased endurance, difficulty walking, decreased ROM, decreased strength, impaired flexibility, postural dysfunction, and pain.   ACTIVITY LIMITATIONS: carrying, lifting, bending, standing, and locomotion level  PARTICIPATION LIMITATIONS: community activity and yard work  PERSONAL FACTORS: Time since onset of injury/illness/exacerbation and 1-2 comorbidities: Chronic lymphocytic leukemia, s/p L L4-5 transforaminal lumbar interbody fusion and decompression, with instrumentation and allograft on 05/17/2023, SIADH, HTN, Anxiety and Depression.  are also affecting patient's functional outcome.   REHAB POTENTIAL: Good  CLINICAL DECISION MAKING: Evolving/moderate complexity  EVALUATION COMPLEXITY: Moderate   GOALS: Goals reviewed with patient? Yes  SHORT TERM GOALS: Target date: 09/04/2023   Patient will be independent with initial HEP.  Baseline: given Goal status: MET 09/04/23   LONG  TERM GOALS: Target date: 10/16/2023   Patient will be independent with advanced/ongoing HEP to improve outcomes and carryover.  Baseline:  Goal status: IN PROGRESS 08/28/23- reviewed  2.  Patient demonstrate improved posture with gait.    Baseline: forward flexed posture with gait ~ 15 deg Goal status: IN PROGRESS 10/02/23- improving, but still stands with bil knee flexion and 10 deg hip flexion.   3.  Patient will demonstrate improved hip extensor  and abductor strength to 5/5.  Baseline: see objective Goal status: IN PROGRESS 09/27/23- very weak 3/5 B  4.  Patient will be able to lift 20lbs safely with good lifting technique to avoid lumbar strain to perform household ADLS. Baseline: has been under lifting restrictions.  Goal status: IN PROGRESS 09/27/23- DNT but reports ongoing difficulty with lifting mechanics on his own   5.  Patient will report at least 6 points improvement on modified Oswestry to demonstrate improved functional ability.  Baseline: 6/50 Goal status: IN PROGRESS 09/27/23 5/50  6.  Patient will be able to return to all activities without limitation from LBP. Baseline: has stopped fishing Goal status: IN PROGRESS 09/27/23 PLAN:  PT FREQUENCY: 1-2x/week  PT DURATION: 8 weeks  PLANNED INTERVENTIONS: Therapeutic exercises, Therapeutic activity, Neuromuscular re-education, Balance training, Gait training, Patient/Family education, Self Care, Joint mobilization,  Stair training, Orthotic/Fit training, Dry Needling, Electrical stimulation, Spinal manipulation, Spinal mobilization, Cryotherapy, Moist heat, Taping, Ultrasound, Ionotophoresis 4mg /ml Dexamethasone, Manual therapy, and Re-evaluation.  PLAN FOR NEXT SESSION: Continue core strengthening exercises, balance, posterior chain strengthening. Continue to challenge mm endurance and work towards advanced HEP/DC to independent program by end of POC   Jena Gauss, PT, DPT  10/02/23 11:13 AM

## 2023-10-05 ENCOUNTER — Ambulatory Visit: Payer: Medicare HMO

## 2023-10-05 DIAGNOSIS — M6281 Muscle weakness (generalized): Secondary | ICD-10-CM

## 2023-10-05 DIAGNOSIS — M48062 Spinal stenosis, lumbar region with neurogenic claudication: Secondary | ICD-10-CM

## 2023-10-05 DIAGNOSIS — M5459 Other low back pain: Secondary | ICD-10-CM

## 2023-10-05 DIAGNOSIS — R2689 Other abnormalities of gait and mobility: Secondary | ICD-10-CM

## 2023-10-05 NOTE — Therapy (Signed)
OUTPATIENT PHYSICAL THERAPY TREATMENT   Patient Name: Bobby [name] MRN: [id number] DOB:[birth year], 72 y.o., male Today's Date: 10/05/2023     END OF SESSION:  PT End of Session - 10/05/23 1117     Visit Number 12    Date for PT Re-Evaluation 10/16/23    Authorization Type [employer] MCR    Progress Note Due on Visit 20    PT Start Time 1022    PT Stop Time 1108    PT Time Calculation (min) 46 min    Activity Tolerance Patient tolerated treatment well    Behavior During Therapy WFL for tasks assessed/performed                   Past Medical History:  Diagnosis Date   Anxiety and depression    Hypertension    Hyponatremia 03/2013   ongoing, seeing Dr. Allena Katz   Insomnia    Melanoma in situ of back [facility])    biopsy on 03/05/2020   SIADH (syndrome of inappropriate ADH production) (HCC) 2017   Dr. Allena Katz   Squamous cell carcinoma in situ    Past Surgical History:  Procedure Laterality Date   BRONCHIAL BIOPSY  11/27/2022   Procedure: BRONCHIAL BIOPSIES;  Surgeon: Leslye Peer, MD;  Location: [facility] ENDOSCOPY;  Service: Cardiopulmonary;;   BRONCHIAL BRUSHINGS  11/27/2022   Procedure: BRONCHIAL BRUSHINGS;  Surgeon: Leslye Peer, MD;  Location: [facility] ENDOSCOPY;  Service: Cardiopulmonary;;   BRONCHIAL WASHINGS  11/27/2022   Procedure: BRONCHIAL WASHINGS;  Surgeon: Leslye Peer, MD;  Location: [name];  Service: Cardiopulmonary;;   MOHS SURGERY  ~ 04/2020   face, [facility]   SHOULDER SURGERY  1980s   right shoulder   TRANSFORAMINAL LUMBAR INTERBODY FUSION (TLIF) WITH PEDICLE SCREW FIXATION 1 LEVEL Left 05/17/2023   Procedure: LEFT-SIDED LUMBAR 4- LUMBAR 5 TRANSFORAMINAL LUMBAR INTERBODY FUSION AND DECOMPRESSION WITH INSTRUMENTATION AND ALLOGRAFT;  Surgeon: Estill Bamberg, MD;  Location: [facility] OR;  Service: Orthopedics;  Laterality: Left;   VIDEO BRONCHOSCOPY N/A 11/27/2022   Procedure: VIDEO BRONCHOSCOPY WITH FLUORO;  Surgeon: Leslye Peer, MD;  Location: [facility] ENDOSCOPY;   Service: Cardiopulmonary;  Laterality: N/A;   Patient Active Problem List   Diagnosis Date Noted   Radiculopathy of lumbar region 05/17/2023   CLL (chronic lymphocytic leukemia) (HCC) 02/21/2023   Abnormal CT of the chest 11/01/2022   Melanoma in situ of back (HCC) 03/16/2020   Squamous cell carcinoma in situ 08/21/2019   Vitamin D deficiency 05/29/2019   Cough 01/01/2016   PCP NOTES >>>> 09/28/2015   Hypogonadism in male 05/13/2015   SIADH (syndrome of inappropriate ADH production) (HCC) 04/13/2013   Memory problem 04/13/2013   Annual physical exam 03/06/2012   Idiopathic scoliosis and kyphoscoliosis 08/18/2010   BACK PAIN, LUMBAR 07/21/2010   Anxiety state 04/01/2009   Essential hypertension, benign 01/04/2009   INSOMNIA-SLEEP DISORDER-UNSPEC 09/08/2008    PCP: Wanda Plump, MD   REFERRING PROVIDER: Estill Bamberg, MD   REFERRING DIAG: S32.010A (ICD-10-CM) - Wedge compression fracture of first lumbar vertebra, initial encounter for closed fracture   Rationale for Evaluation and Treatment: Rehabilitation  THERAPY DIAG:  Other low back pain  Other abnormalities of gait and mobility  Muscle weakness (generalized)  Spinal stenosis of lumbar region with neurogenic claudication  ONSET DATE: s/p L L4-5 transforaminal lumbar interbody fusion and decompression, with instrumentation and allograft on 05/17/2023  SUBJECTIVE:  SUBJECTIVE STATEMENT:    Doing well, feel good, no pain.    PERTINENT HISTORY:  Chronic lymphocytic leukemia, s/p L L4-5 transforaminal lumbar interbody fusion and decompression, with instrumentation and allograft on 05/17/2023, SIADH, HTN, Anxiety and Depression.  PAIN:  Are you having pain? No 1/10 now   PRECAUTIONS: Back  no lifting more than 30# (updated 09/27/23)    RED FLAGS: None   WEIGHT BEARING RESTRICTIONS: No  FALLS:  Has patient fallen in last 6 months? No  LIVING ENVIRONMENT: Lives with: lives with their spouse Lives in: House/apartment Stairs: Yes: Internal: 11 steps; can reach both and External: 5 steps; on right going up Has following equipment at home: None  OCCUPATION: works at desk, on computer most of the day  PLOF: Independent; likes fly fishing  PATIENT GOALS: get back muscles stronger, walk more, get back to fly fishing and biking  NEXT MD VISIT: beginning of November  OBJECTIVE:   DIAGNOSTIC FINDINGS:  Result Date: 05/17/2023 CLINICAL DATA:  Elective surgery. EXAM: LUMBAR SPINE - 2-3 VIEW COMPARISON:  None Available. FINDINGS: Two fluoroscopic spot views of the lumbar spine obtained in the operating room in frontal and lateral projections. Posterior rod with intrapedicular screw fusion and interbody spacer at L4-L5. Fluoroscopy time 46 seconds. Dose 28.96 mGy. IMPRESSION: Intraoperative fluoroscopy during L4-L5 fusion  PATIENT SURVEYS:  Modified Oswestry 6/50; 09/27/23- 5/50    COGNITION: Overall cognitive status: Within functional limits for tasks assessed     SENSATION: WFL  MUSCLE LENGTH: Hamstrings: Right 140 deg; Left 130 deg  POSTURE: flexed trunk ~ 15 deg walking   LUMBAR ROM:   AROM eval  Flexion 85 tight hamstrings  Extension 30   Right lateral flexion To knees  Left lateral flexion To knees  Right rotation WNL  Left rotation WNL   (Blank rows = not tested)  LOWER EXTREMITY ROM:   very good hip mobility for both ER and IR, symmetric and pain free   LOWER EXTREMITY MMT:    MMT Right Eval Left Eval Right 09/27/23 Left 09/27/23  Hip flexion 5 5 5 5   Hip extension 4+ 4+ 3 3  Hip abduction 4* 4*  3 3  Hip adduction 5 5    Knee flexion 5 5 5 5   Knee extension 5 5 5 5   Ankle dorsiflexion 5 5    Ankle plantarflexion 5 5     (Blank rows = not tested) * seated  FUNCTIONAL TESTS:  6  minute walk test: 1025' Functional gait assessment: 22/30; 09/27/23 20/30   GAIT: Distance walked: 40' Assistive device utilized: None Level of assistance: Complete Independence Comments: maintains forward flexion   TODAY'S TREATMENT:                                                                                                                              DATE:  10/05/23 Therapeutic Exercise: to improve strength and mobility.  Demo, verbal and tactile cues throughout  for technique. Bike L3 x 6 min  SLS 2x15" each LE with UE support Toe taps to yoga block x 10; to 9' stool x 10  Unilateral farmer carry 10lb 33' each arm; then 15lb each arm for 90' Monster walks GTB at knees Standing hip abduction RTB x 10  Hip hike on step 4'x 10 bil  10/02/23 Therapeutic Exercise: to improve strength and mobility.  Demo, verbal and tactile cues throughout for technique. Bike L3 x 7 min  SL hip abduction x 10 bil - very challenging, added to [employer] walks with GTB, 3 laps, cues for speed, CGA needed for balance Neuromuscular Reeducation: to improve balance and stability. SBA for safety throughout.  Star excursion 180 deg front to back progressing from stepping to tap to encourage SLS Attempted single leg RDL with table in front for safety, could not transition into SLS SL stance at counter x 30 sec bil - added to HEP  09/27/23  Modified Oswestry/discussion regarding progress and goals/objective measures as above for [facility] progress note   Session limited as patient took business phone call in the middle of session, unable to complete formal PT education and interventions   09/17/23: Therex: instructed in various cardiovascular, stability, balance training ex to improve his function/motor control: Standing with 1 foot on stool with yellow weight ball diagonal chops x 10 each side - SBA for safety.  BATCA hamstring curls 45#, B concentric, with eccentric isolation R leg for return, 15 x  2 BATCA knee ext B 55# 15 x 2 Quadriped alt shoulder flex, alt hip ext, alt arm and leg ext, all 10 reps each  At stair well for knee drivers, 15 x each leg Standing for cable hip ext 5# plate, 15 x 2 B calf stretch off step, 15 sec holds 4 x  09/14/23 Therapeutic Exercise: to improve strength and mobility.  Demo, verbal and tactile cues throughout for technique. Gait x 5 min warm-up - cues needed for L heel strike, shuffling and off balance today BATCA Leg extension 45# 2 x 15 BATCA Hamstring curls 45# x 15, 50# x 15  Seated paloff press GTB x 20 each direction Seated reaches orange weighted ball x 20 Standing with 1 foot on stool with yellow weight ball diagonal chops x 10 each side - SBA for safety.  -after session completed x 10 min recumbent bike L4  09/11/23: Therapeutic exercise: Recumbent bicycle, 6 min Forward step ups 6" with L knee drivers Quadriped alt shoulder ext Quadriped alt hip ext Quadriped alt arm and leg ext Leg press with BATCA 45#, 2 sets 10 but pain L post knee so discontinued B Knee ext with BATCA 55#, 3 x 15 B knee flexion BATCA 45# 15 reps x 3 Side stepping along counter, 10' with green t band around thighs, cues to maintain upright standing posture to engage hip stabilizers B heel drops off step, 15x, cues for full ROM both directions Tandem standing, 30 sec holds, alt lead foot Standing cable hip ext with 5# 2 x 10 each leg  09/07/23 Therapeutic Exercise: to improve strength and mobility.  Demo, verbal and tactile cues throughout for technique. Bike L3x44min Step ups no UE support on 8' step x 10 BLE Lateral step ups no UE support on 8' step x 10 BLE Standing shoulder extension 20# x 12  Pallof press 10# x 10 bil RDLs with 10# kettlebell x 10   PATIENT EDUCATION:  Education details: HEP review Person educated: Patient Education method:  Explanation, Demonstration, Verbal cues, and Handouts Education comprehension: verbalized understanding and  returned demonstration  HOME EXERCISE PROGRAM: Access Code: [email or web address] URL: [email or web address] Date: 10/05/2023 Prepared by: Verta Ellen  Exercises - Supine Posterior Pelvic Tilt  - 1-2 x daily - 3 x weekly - 2 sets - 10 reps - 5 sec hold - Supine Bridge  - 1 x daily - 3 x weekly - 2 sets - 10 reps - Supine Active Straight Leg Raise  - 1 x daily - 3 x weekly - 2 sets - 10 reps - Bent Knee Fallouts  - 1 x daily - 3 x weekly - 2 sets - 10 reps - Supine 90/90 Sciatic Nerve Glide with Knee Flexion/Extension  - 1 x daily - 3 x weekly - 1 sets - 10 reps - Bird Dog Progression  - 1 x daily - 3 x weekly - 2 sets - 10 reps - Forward Step Up  - 1 x daily - 3 x weekly - 2 sets - 10 reps - Lateral Step Up with Counter Support  - 1 x daily - 3 x weekly - 2 sets - 10 reps - [place] Deadlift With Barbell  - 1 x daily - 3 x weekly - 2 sets - 10 reps - Sidelying Hip Abduction  - 1 x daily - 7 x weekly - 3 sets - 10 reps - Standing Single Leg Stance with Counter Support  - 1 x daily - 7 x weekly - 1 sets - 3 reps - 30 sec  hold - Hip Hiking on Step  - 2-3 x daily - 3-4 x weekly - 2 sets - 10 reps  ASSESSMENT:  CLINICAL IMPRESSION: Continued with lumbar and LE strengthening to improve function. Pt required cues to avoid shuffling gait with the farmer carries. He shows lots of compensation with hip abduction, added hip hike for proprioceptive feedback and facilitation of glute med B. No increased pain or symptoms post session.  Bobby [name] continues to demonstrate potential for improvement and would benefit from continued skilled therapy to address impairments.      OBJECTIVE IMPAIRMENTS: Abnormal gait, decreased activity tolerance, decreased endurance, difficulty walking, decreased ROM, decreased strength, impaired flexibility, postural dysfunction, and pain.   ACTIVITY LIMITATIONS: carrying, lifting, bending, standing, and locomotion level  PARTICIPATION LIMITATIONS: community  activity and yard work  PERSONAL FACTORS: Time since onset of injury/illness/exacerbation and 1-2 comorbidities: Chronic lymphocytic leukemia, s/p L L4-5 transforaminal lumbar interbody fusion and decompression, with instrumentation and allograft on 05/17/2023, SIADH, HTN, Anxiety and Depression.  are also affecting patient's functional outcome.   REHAB POTENTIAL: Good  CLINICAL DECISION MAKING: Evolving/moderate complexity  EVALUATION COMPLEXITY: Moderate   GOALS: Goals reviewed with patient? Yes  SHORT TERM GOALS: Target date: 09/04/2023   Patient will be independent with initial HEP.  Baseline: given Goal status: MET 09/04/23   LONG TERM GOALS: Target date: 10/16/2023   Patient will be independent with advanced/ongoing HEP to improve outcomes and carryover.  Baseline:  Goal status: IN PROGRESS 08/28/23- reviewed  2.  Patient demonstrate improved posture with gait.    Baseline: forward flexed posture with gait ~ 15 deg Goal status: IN PROGRESS 10/02/23- improving, but still stands with bil knee flexion and 10 deg hip flexion.   3.  Patient will demonstrate improved hip extensor  and abductor strength to 5/5.  Baseline: see objective Goal status: IN PROGRESS 09/27/23- very weak 3/5 B  4.  Patient will be able to lift 20lbs safely with  good lifting technique to avoid lumbar strain to perform household ADLS. Baseline: has been under lifting restrictions.  Goal status: IN PROGRESS 09/27/23- DNT but reports ongoing difficulty with lifting mechanics on his own   5.  Patient will report at least 6 points improvement on modified Oswestry to demonstrate improved functional ability.  Baseline: 6/50 Goal status: IN PROGRESS 09/27/23 5/50  6.  Patient will be able to return to all activities without limitation from LBP. Baseline: has stopped fishing Goal status: IN PROGRESS 09/27/23 PLAN:  PT FREQUENCY: 1-2x/week  PT DURATION: 8 weeks  PLANNED INTERVENTIONS: Therapeutic  exercises, Therapeutic activity, Neuromuscular re-education, Balance training, Gait training, Patient/Family education, Self Care, Joint mobilization, Stair training, Orthotic/Fit training, Dry Needling, Electrical stimulation, Spinal manipulation, Spinal mobilization, Cryotherapy, Moist heat, Taping, Ultrasound, Ionotophoresis 4mg /ml Dexamethasone, Manual therapy, and Re-evaluation.  PLAN FOR NEXT SESSION: Continue core strengthening exercises, balance, posterior chain strengthening. Continue to challenge mm endurance and work towards advanced HEP/DC to independent program by end of POC   Darleene Cleaver, PTA 10/05/23 11:17 AM

## 2023-10-09 ENCOUNTER — Encounter: Payer: Self-pay | Admitting: Physical Therapy

## 2023-10-09 ENCOUNTER — Ambulatory Visit: Payer: Medicare HMO | Admitting: Physical Therapy

## 2023-10-09 DIAGNOSIS — R2689 Other abnormalities of gait and mobility: Secondary | ICD-10-CM

## 2023-10-09 DIAGNOSIS — M5459 Other low back pain: Secondary | ICD-10-CM | POA: Diagnosis not present

## 2023-10-09 DIAGNOSIS — M6281 Muscle weakness (generalized): Secondary | ICD-10-CM

## 2023-10-09 NOTE — Therapy (Signed)
OUTPATIENT PHYSICAL THERAPY TREATMENT   Patient Name: Bobby [name] MRN: [id number] DOB:Feb 22, 1951, 72 y.o., male Today's Date: 10/09/2023     END OF SESSION:  PT End of Session - 10/09/23 1111     Visit Number 13    Date for PT Re-Evaluation 10/16/23    Authorization Type [employer] MCR    Progress Note Due on Visit 20    PT Start Time 1106    PT Stop Time 1152    PT Time Calculation (min) 46 min    Activity Tolerance Patient tolerated treatment well    Behavior During Therapy [facility] for tasks assessed/performed                   Past Medical History:  Diagnosis Date   Anxiety and depression    Hypertension    Hyponatremia 03/2013   ongoing, seeing Dr. Allena Katz   Insomnia    Melanoma in situ of back [facility])    biopsy on 03/05/2020   SIADH (syndrome of inappropriate ADH production) (HCC) 2017   Dr. Allena Katz   Squamous cell carcinoma in situ    Past Surgical History:  Procedure Laterality Date   BRONCHIAL BIOPSY  11/27/2022   Procedure: BRONCHIAL BIOPSIES;  Surgeon: Leslye Peer, MD;  Location: [facility] ENDOSCOPY;  Service: Cardiopulmonary;;   BRONCHIAL BRUSHINGS  11/27/2022   Procedure: BRONCHIAL BRUSHINGS;  Surgeon: Leslye Peer, MD;  Location: [facility] ENDOSCOPY;  Service: Cardiopulmonary;;   BRONCHIAL WASHINGS  11/27/2022   Procedure: BRONCHIAL WASHINGS;  Surgeon: Leslye Peer, MD;  Location: [name];  Service: Cardiopulmonary;;   MOHS SURGERY  ~ 04/2020   face, [facility]   SHOULDER SURGERY  1980s   right shoulder   TRANSFORAMINAL LUMBAR INTERBODY FUSION (TLIF) WITH PEDICLE SCREW FIXATION 1 LEVEL Left 05/17/2023   Procedure: LEFT-SIDED LUMBAR 4- LUMBAR 5 TRANSFORAMINAL LUMBAR INTERBODY FUSION AND DECOMPRESSION WITH INSTRUMENTATION AND ALLOGRAFT;  Surgeon: Estill Bamberg, MD;  Location: [facility] OR;  Service: Orthopedics;  Laterality: Left;   VIDEO BRONCHOSCOPY N/A 11/27/2022   Procedure: VIDEO BRONCHOSCOPY WITH FLUORO;  Surgeon: Leslye Peer, MD;  Location: [facility] ENDOSCOPY;   Service: Cardiopulmonary;  Laterality: N/A;   Patient Active Problem List   Diagnosis Date Noted   Radiculopathy of lumbar region 05/17/2023   CLL (chronic lymphocytic leukemia) (HCC) 02/21/2023   Abnormal CT of the chest 11/01/2022   Melanoma in situ of back (HCC) 03/16/2020   Squamous cell carcinoma in situ 08/21/2019   Vitamin D deficiency 05/29/2019   Cough 01/01/2016   PCP NOTES >>>> 09/28/2015   Hypogonadism in male 05/13/2015   SIADH (syndrome of inappropriate ADH production) (HCC) 04/13/2013   Memory problem 04/13/2013   Annual physical exam 03/06/2012   Idiopathic scoliosis and kyphoscoliosis 08/18/2010   BACK PAIN, LUMBAR 07/21/2010   Anxiety state 04/01/2009   Essential hypertension, benign 01/04/2009   INSOMNIA-SLEEP DISORDER-UNSPEC 09/08/2008    PCP: Wanda Plump, MD   REFERRING PROVIDER: Estill Bamberg, MD   REFERRING DIAG: S32.010A (ICD-10-CM) - Wedge compression fracture of first lumbar vertebra, initial encounter for closed fracture   Rationale for Evaluation and Treatment: Rehabilitation  THERAPY DIAG:  Other low back pain  Other abnormalities of gait and mobility  Muscle weakness (generalized)  ONSET DATE: s/p L L4-5 transforaminal lumbar interbody fusion and decompression, with instrumentation and allograft on 05/17/2023  SUBJECTIVE:  SUBJECTIVE STATEMENT:    Doing well, no complaints.  Trying to walk/stand straighter.    PERTINENT HISTORY:  Chronic lymphocytic leukemia, s/p L L4-5 transforaminal lumbar interbody fusion and decompression, with instrumentation and allograft on 05/17/2023, SIADH, HTN, Anxiety and Depression.  PAIN:  Are you having pain? No   PRECAUTIONS: Back  no lifting more than 30# (updated 09/27/23)   RED FLAGS: None   WEIGHT BEARING  RESTRICTIONS: No  FALLS:  Has patient fallen in last 6 months? No  LIVING ENVIRONMENT: Lives with: lives with their spouse Lives in: House/apartment Stairs: Yes: Internal: 11 steps; can reach both and External: 5 steps; on right going up Has following equipment at home: None  OCCUPATION: works at desk, on computer most of the day  PLOF: Independent; likes fly fishing  PATIENT GOALS: get back muscles stronger, walk more, get back to fly fishing and biking  NEXT MD VISIT: beginning of November  OBJECTIVE:   DIAGNOSTIC FINDINGS:  Result Date: 05/17/2023 CLINICAL DATA:  Elective surgery. EXAM: LUMBAR SPINE - 2-3 VIEW COMPARISON:  None Available. FINDINGS: Two fluoroscopic spot views of the lumbar spine obtained in the operating room in frontal and lateral projections. Posterior rod with intrapedicular screw fusion and interbody spacer at L4-L5. Fluoroscopy time 46 seconds. Dose 28.96 mGy. IMPRESSION: Intraoperative fluoroscopy during L4-L5 fusion  PATIENT SURVEYS:  Modified Oswestry 6/50; 09/27/23- 5/50    COGNITION: Overall cognitive status: Within functional limits for tasks assessed     SENSATION: WFL  MUSCLE LENGTH: Hamstrings: Right 140 deg; Left 130 deg  POSTURE: flexed trunk ~ 15 deg walking   LUMBAR ROM:   AROM eval  Flexion 85 tight hamstrings  Extension 30   Right lateral flexion To knees  Left lateral flexion To knees  Right rotation WNL  Left rotation WNL   (Blank rows = not tested)  LOWER EXTREMITY ROM:   very good hip mobility for both ER and IR, symmetric and pain free   LOWER EXTREMITY MMT:    MMT Right Eval Left Eval Right 09/27/23 Left 09/27/23  Hip flexion 5 5 5 5   Hip extension 4+ 4+ 3 3  Hip abduction 4* 4*  3 3  Hip adduction 5 5    Knee flexion 5 5 5 5   Knee extension 5 5 5 5   Ankle dorsiflexion 5 5    Ankle plantarflexion 5 5     (Blank rows = not tested) * seated  FUNCTIONAL TESTS:  6 minute walk test: 1025' Functional gait  assessment: 22/30; 09/27/23 20/30   GAIT: Distance walked: 19' Assistive device utilized: None Level of assistance: Complete Independence Comments: maintains forward flexion   TODAY'S TREATMENT:                                                                                                                              DATE:  10/09/23 Neuromuscular Reeducation: to improve balance and stability. SBA for safety throughout.  Bike L3 x 6 min for warm-up/muscle perfusion In hallway - with one hand on wall as needed for stability and SBA Forward tandem walking Backward tandem walking Side stepping with squat - ball toss back and forth to therapist for dual task Forward walking with ball toss- SBA, to encourage looking up and back extension Backward walking with ball toss to encourage back extension- SBA Forward step and reach x 10 bil - for back extension - added to HEP.   10/05/23 Therapeutic Exercise: to improve strength and mobility.  Demo, verbal and tactile cues throughout for technique. Bike L3 x 6 min  SLS 2x15" each LE with UE support Toe taps to yoga block x 10; to 9' stool x 10  Unilateral farmer carry 10lb 13' each arm; then 15lb each arm for 90' Monster walks GTB at knees Standing hip abduction RTB x 10  Hip hike on step 4'x 10 bil  10/02/23 Therapeutic Exercise: to improve strength and mobility.  Demo, verbal and tactile cues throughout for technique. Bike L3 x 7 min  SL hip abduction x 10 bil - very challenging, added to [employer] walks with GTB, 3 laps, cues for speed, CGA needed for balance Neuromuscular Reeducation: to improve balance and stability. SBA for safety throughout.  Star excursion 180 deg front to back progressing from stepping to tap to encourage SLS Attempted single leg RDL with table in front for safety, could not transition into SLS SL stance at counter x 30 sec bil - added to HEP   PATIENT EDUCATION:  Education details: HEP update Person  educated: Patient Education method: Explanation, Demonstration, Verbal cues, and Handouts Education comprehension: verbalized understanding and returned demonstration  HOME EXERCISE PROGRAM: Access Code: [email or web address] URL: [email or web address] Date: 10/09/2023 Prepared by: Harrie Foreman  Exercises - Supine Posterior Pelvic Tilt  - 1-2 x daily - 3 x weekly - 2 sets - 10 reps - 5 sec hold - Supine Bridge  - 1 x daily - 3 x weekly - 2 sets - 10 reps - Supine Active Straight Leg Raise  - 1 x daily - 3 x weekly - 2 sets - 10 reps - Bent Knee Fallouts  - 1 x daily - 3 x weekly - 2 sets - 10 reps - Supine 90/90 Sciatic Nerve Glide with Knee Flexion/Extension  - 1 x daily - 3 x weekly - 1 sets - 10 reps - Bird Dog Progression  - 1 x daily - 3 x weekly - 2 sets - 10 reps - Forward Step Up  - 1 x daily - 3 x weekly - 2 sets - 10 reps - Lateral Step Up with Counter Support  - 1 x daily - 3 x weekly - 2 sets - 10 reps - [place] Deadlift With Barbell  - 1 x daily - 3 x weekly - 2 sets - 10 reps - Sidelying Hip Abduction  - 1 x daily - 7 x weekly - 3 sets - 10 reps - Standing Single Leg Stance with Counter Support  - 1 x daily - 7 x weekly - 1 sets - 3 reps - 30 sec  hold - Hip Hiking on Step  - 2-3 x daily - 3-4 x weekly - 2 sets - 10 reps - Step Forward with Opposite Arm Reach  - 1 x daily - 7 x weekly - 2 sets - 10 reps  ASSESSMENT:  CLINICAL IMPRESSION: Today focused on gait as continues to flex forward when walking, although standing posture  is more upright.  Very challenged with tandem walking even with wall support, could not heel toe walk, just place feet in narrow stance.  Was more successful with step and reach with getting lumbar and thoracic extension, so added this to HEP.   [name] continues to demonstrate potential for improvement and would benefit from continued skilled therapy to address impairments.      OBJECTIVE IMPAIRMENTS: Abnormal gait, decreased  activity tolerance, decreased endurance, difficulty walking, decreased ROM, decreased strength, impaired flexibility, postural dysfunction, and pain.   ACTIVITY LIMITATIONS: carrying, lifting, bending, standing, and locomotion level  PARTICIPATION LIMITATIONS: community activity and yard work  PERSONAL FACTORS: Time since onset of injury/illness/exacerbation and 1-2 comorbidities: Chronic lymphocytic leukemia, s/p L L4-5 transforaminal lumbar interbody fusion and decompression, with instrumentation and allograft on 05/17/2023, SIADH, HTN, Anxiety and Depression.  are also affecting patient's functional outcome.   REHAB POTENTIAL: Good  CLINICAL DECISION MAKING: Evolving/moderate complexity  EVALUATION COMPLEXITY: Moderate   GOALS: Goals reviewed with patient? Yes  SHORT TERM GOALS: Target date: 09/04/2023   Patient will be independent with initial HEP.  Baseline: given Goal status: MET 09/04/23   LONG TERM GOALS: Target date: 10/16/2023   Patient will be independent with advanced/ongoing HEP to improve outcomes and carryover.  Baseline:  Goal status: IN PROGRESS 08/28/23- reviewed  2.  Patient demonstrate improved posture with gait.    Baseline: forward flexed posture with gait ~ 15 deg Goal status: IN PROGRESS 10/02/23- improving, but still stands with bil knee flexion and 10 deg hip flexion.   3.  Patient will demonstrate improved hip extensor  and abductor strength to 5/5.  Baseline: see objective Goal status: IN PROGRESS 09/27/23- very weak 3/5 B  4.  Patient will be able to lift 20lbs safely with good lifting technique to avoid lumbar strain to perform household ADLS. Baseline: has been under lifting restrictions.  Goal status: IN PROGRESS 09/27/23- DNT but reports ongoing difficulty with lifting mechanics on his own   5.  Patient will report at least 6 points improvement on modified Oswestry to demonstrate improved functional ability.  Baseline: 6/50 Goal status: IN  PROGRESS 09/27/23 5/50  6.  Patient will be able to return to all activities without limitation from LBP. Baseline: has stopped fishing Goal status: IN PROGRESS 09/27/23 PLAN:  PT FREQUENCY: 1-2x/week  PT DURATION: 8 weeks  PLANNED INTERVENTIONS: Therapeutic exercises, Therapeutic activity, Neuromuscular re-education, Balance training, Gait training, Patient/Family education, Self Care, Joint mobilization, Stair training, Orthotic/Fit training, Dry Needling, Electrical stimulation, Spinal manipulation, Spinal mobilization, Cryotherapy, Moist heat, Taping, Ultrasound, Ionotophoresis 4mg /ml Dexamethasone, Manual therapy, and Re-evaluation.  PLAN FOR NEXT SESSION: Continue core strengthening exercises, balance, posterior chain strengthening. Continue to challenge mm endurance and work towards advanced HEP/DC to independent program by end of POC   Jena Gauss, PT 10/09/23 12:59 PM

## 2023-10-10 ENCOUNTER — Ambulatory Visit (HOSPITAL_BASED_OUTPATIENT_CLINIC_OR_DEPARTMENT_OTHER)
Admission: RE | Admit: 2023-10-10 | Discharge: 2023-10-10 | Disposition: A | Discharge status: Home / Self Care | Payer: Medicare HMO | Source: Ambulatory Visit | Attending: Emergency Medicine | Admitting: Emergency Medicine

## 2023-10-10 ENCOUNTER — Other Ambulatory Visit (HOSPITAL_BASED_OUTPATIENT_CLINIC_OR_DEPARTMENT_OTHER): Payer: Medicare HMO

## 2023-10-10 DIAGNOSIS — K449 Diaphragmatic hernia without obstruction or gangrene: Secondary | ICD-10-CM | POA: Insufficient documentation

## 2023-10-10 DIAGNOSIS — R918 Other nonspecific abnormal finding of lung field: Secondary | ICD-10-CM | POA: Insufficient documentation

## 2023-10-10 DIAGNOSIS — M5459 Other low back pain: Secondary | ICD-10-CM | POA: Diagnosis not present

## 2023-10-10 DIAGNOSIS — J479 Bronchiectasis, uncomplicated: Secondary | ICD-10-CM | POA: Insufficient documentation

## 2023-10-10 DIAGNOSIS — I700 Atherosclerosis of aorta: Secondary | ICD-10-CM | POA: Insufficient documentation

## 2023-10-10 DIAGNOSIS — R9389 Abnormal findings on diagnostic imaging of other specified body structures: Secondary | ICD-10-CM | POA: Diagnosis not present

## 2023-10-10 DIAGNOSIS — I2510 Atherosclerotic heart disease of native coronary artery without angina pectoris: Secondary | ICD-10-CM | POA: Insufficient documentation

## 2023-10-12 ENCOUNTER — Ambulatory Visit: Payer: Medicare HMO

## 2023-10-12 DIAGNOSIS — M5459 Other low back pain: Secondary | ICD-10-CM | POA: Diagnosis not present

## 2023-10-12 DIAGNOSIS — M48062 Spinal stenosis, lumbar region with neurogenic claudication: Secondary | ICD-10-CM

## 2023-10-12 DIAGNOSIS — R2689 Other abnormalities of gait and mobility: Secondary | ICD-10-CM

## 2023-10-12 DIAGNOSIS — M6281 Muscle weakness (generalized): Secondary | ICD-10-CM

## 2023-10-12 NOTE — Therapy (Signed)
OUTPATIENT PHYSICAL THERAPY TREATMENT   Patient Name: Bobby [name] MRN: [id number] DOB:1951/04/30, 72 y.o., male Today's Date: 10/12/2023     END OF SESSION:  PT End of Session - 10/12/23 1023     Visit Number 14    Date for PT Re-Evaluation 10/16/23    Authorization Type [employer] MCR    Progress Note Due on Visit 20    PT Start Time 1019    PT Stop Time 1100    PT Time Calculation (min) 41 min    Activity Tolerance Patient tolerated treatment well    Behavior During Therapy WFL for tasks assessed/performed                    Past Medical History:  Diagnosis Date   Anxiety and depression    Hypertension    Hyponatremia 03/2013   ongoing, seeing Dr. Allena Katz   Insomnia    Melanoma in situ of back [facility])    biopsy on 03/05/2020   SIADH (syndrome of inappropriate ADH production) (HCC) 2017   Dr. Allena Katz   Squamous cell carcinoma in situ    Past Surgical History:  Procedure Laterality Date   BRONCHIAL BIOPSY  11/27/2022   Procedure: BRONCHIAL BIOPSIES;  Surgeon: Leslye Peer, MD;  Location: [facility] ENDOSCOPY;  Service: Cardiopulmonary;;   BRONCHIAL BRUSHINGS  11/27/2022   Procedure: BRONCHIAL BRUSHINGS;  Surgeon: Leslye Peer, MD;  Location: [facility] ENDOSCOPY;  Service: Cardiopulmonary;;   BRONCHIAL WASHINGS  11/27/2022   Procedure: BRONCHIAL WASHINGS;  Surgeon: Leslye Peer, MD;  Location: [name];  Service: Cardiopulmonary;;   MOHS SURGERY  ~ 04/2020   face, [facility]   SHOULDER SURGERY  1980s   right shoulder   TRANSFORAMINAL LUMBAR INTERBODY FUSION (TLIF) WITH PEDICLE SCREW FIXATION 1 LEVEL Left 05/17/2023   Procedure: LEFT-SIDED LUMBAR 4- LUMBAR 5 TRANSFORAMINAL LUMBAR INTERBODY FUSION AND DECOMPRESSION WITH INSTRUMENTATION AND ALLOGRAFT;  Surgeon: Estill Bamberg, MD;  Location: [facility] OR;  Service: Orthopedics;  Laterality: Left;   VIDEO BRONCHOSCOPY N/A 11/27/2022   Procedure: VIDEO BRONCHOSCOPY WITH FLUORO;  Surgeon: Leslye Peer, MD;  Location: [facility] ENDOSCOPY;   Service: Cardiopulmonary;  Laterality: N/A;   Patient Active Problem List   Diagnosis Date Noted   Radiculopathy of lumbar region 05/17/2023   CLL (chronic lymphocytic leukemia) (HCC) 02/21/2023   Abnormal CT of the chest 11/01/2022   Melanoma in situ of back (HCC) 03/16/2020   Squamous cell carcinoma in situ 08/21/2019   Vitamin D deficiency 05/29/2019   Cough 01/01/2016   PCP NOTES >>>> 09/28/2015   Hypogonadism in male 05/13/2015   SIADH (syndrome of inappropriate ADH production) (HCC) 04/13/2013   Memory problem 04/13/2013   Annual physical exam 03/06/2012   Idiopathic scoliosis and kyphoscoliosis 08/18/2010   BACK PAIN, LUMBAR 07/21/2010   Anxiety state 04/01/2009   Essential hypertension, benign 01/04/2009   INSOMNIA-SLEEP DISORDER-UNSPEC 09/08/2008    PCP: Wanda Plump, MD   REFERRING PROVIDER: Estill Bamberg, MD   REFERRING DIAG: S32.010A (ICD-10-CM) - Wedge compression fracture of first lumbar vertebra, initial encounter for closed fracture   Rationale for Evaluation and Treatment: Rehabilitation  THERAPY DIAG:  Other low back pain  Other abnormalities of gait and mobility  Muscle weakness (generalized)  Spinal stenosis of lumbar region with neurogenic claudication  ONSET DATE: s/p L L4-5 transforaminal lumbar interbody fusion and decompression, with instrumentation and allograft on 05/17/2023  SUBJECTIVE:  SUBJECTIVE STATEMENT:   Pt reports he continues to do well,  PERTINENT HISTORY:  Chronic lymphocytic leukemia, s/p L L4-5 transforaminal lumbar interbody fusion and decompression, with instrumentation and allograft on 05/17/2023, SIADH, HTN, Anxiety and Depression.  PAIN:  Are you having pain? No   PRECAUTIONS: Back  no lifting more than 30# (updated 09/27/23)   RED  FLAGS: None   WEIGHT BEARING RESTRICTIONS: No  FALLS:  Has patient fallen in last 6 months? No  LIVING ENVIRONMENT: Lives with: lives with their spouse Lives in: House/apartment Stairs: Yes: Internal: 11 steps; can reach both and External: 5 steps; on right going up Has following equipment at home: None  OCCUPATION: works at desk, on computer most of the day  PLOF: Independent; likes fly fishing  PATIENT GOALS: get back muscles stronger, walk more, get back to fly fishing and biking  NEXT MD VISIT: beginning of November  OBJECTIVE:   DIAGNOSTIC FINDINGS:  Result Date: 05/17/2023 CLINICAL DATA:  Elective surgery. EXAM: LUMBAR SPINE - 2-3 VIEW COMPARISON:  None Available. FINDINGS: Two fluoroscopic spot views of the lumbar spine obtained in the operating room in frontal and lateral projections. Posterior rod with intrapedicular screw fusion and interbody spacer at L4-L5. Fluoroscopy time 46 seconds. Dose 28.96 mGy. IMPRESSION: Intraoperative fluoroscopy during L4-L5 fusion  PATIENT SURVEYS:  Modified Oswestry 6/50; 09/27/23- 5/50    COGNITION: Overall cognitive status: Within functional limits for tasks assessed     SENSATION: WFL  MUSCLE LENGTH: Hamstrings: Right 140 deg; Left 130 deg  POSTURE: flexed trunk ~ 15 deg walking   LUMBAR ROM:   AROM eval  Flexion 85 tight hamstrings  Extension 30   Right lateral flexion To knees  Left lateral flexion To knees  Right rotation WNL  Left rotation WNL   (Blank rows = not tested)  LOWER EXTREMITY ROM:   very good hip mobility for both ER and IR, symmetric and pain free   LOWER EXTREMITY MMT:    MMT Right Eval Left Eval Right 09/27/23 Left 09/27/23  Hip flexion 5 5 5 5   Hip extension 4+ 4+ 3 3  Hip abduction 4* 4*  3 3  Hip adduction 5 5    Knee flexion 5 5 5 5   Knee extension 5 5 5 5   Ankle dorsiflexion 5 5    Ankle plantarflexion 5 5     (Blank rows = not tested) * seated  FUNCTIONAL TESTS:  6 minute walk  test: 1025' Functional gait assessment: 22/30; 09/27/23 20/30   GAIT: Distance walked: 48' Assistive device utilized: None Level of assistance: Complete Independence Comments: maintains forward flexion   TODAY'S TREATMENT:                                                                                                                              DATE: 10/12/23  Therapeutic Exercise: to improve strength and mobility.  Demo, verbal and tactile cues throughout for technique. Bike L3  x 6 min 3 way shoulder flexion with yellow wt ball back to wall x 10  Fwd step and reach x 10 bil Backward step and reach x 10 bil Box lifting- 20.6lb 5x instruction given for safe and proper lifting mechanics Gait Training: 270 ft- cues for upright posture, increased stride length, arm swing   10/09/23 Neuromuscular Reeducation: to improve balance and stability. SBA for safety throughout.   Bike L3 x 6 min for warm-up/muscle perfusion In hallway - with one hand on wall as needed for stability and SBA Forward tandem walking Backward tandem walking Side stepping with squat - ball toss back and forth to therapist for dual task Forward walking with ball toss- SBA, to encourage looking up and back extension Backward walking with ball toss to encourage back extension- SBA Forward step and reach x 10 bil - for back extension - added to HEP.   10/05/23 Therapeutic Exercise: to improve strength and mobility.  Demo, verbal and tactile cues throughout for technique. Bike L3 x 6 min  SLS 2x15" each LE with UE support Toe taps to yoga block x 10; to 9' stool x 10  Unilateral farmer carry 10lb 77' each arm; then 15lb each arm for 90' Monster walks GTB at knees Standing hip abduction RTB x 10  Hip hike on step 4'x 10 bil  10/02/23 Therapeutic Exercise: to improve strength and mobility.  Demo, verbal and tactile cues throughout for technique. Bike L3 x 7 min  SL hip abduction x 10 bil - very challenging, added  to [employer] walks with GTB, 3 laps, cues for speed, CGA needed for balance Neuromuscular Reeducation: to improve balance and stability. SBA for safety throughout.  Star excursion 180 deg front to back progressing from stepping to tap to encourage SLS Attempted single leg RDL with table in front for safety, could not transition into SLS SL stance at counter x 30 sec bil - added to HEP   PATIENT EDUCATION:  Education details: HEP update Person educated: Patient Education method: Explanation, Demonstration, Verbal cues, and Handouts Education comprehension: verbalized understanding and returned demonstration  HOME EXERCISE PROGRAM: Access Code: [email or web address] URL: [email or web address] Date: 10/09/2023 Prepared by: Harrie Foreman  Exercises - Supine Posterior Pelvic Tilt  - 1-2 x daily - 3 x weekly - 2 sets - 10 reps - 5 sec hold - Supine Bridge  - 1 x daily - 3 x weekly - 2 sets - 10 reps - Supine Active Straight Leg Raise  - 1 x daily - 3 x weekly - 2 sets - 10 reps - Bent Knee Fallouts  - 1 x daily - 3 x weekly - 2 sets - 10 reps - Supine 90/90 Sciatic Nerve Glide with Knee Flexion/Extension  - 1 x daily - 3 x weekly - 1 sets - 10 reps - Bird Dog Progression  - 1 x daily - 3 x weekly - 2 sets - 10 reps - Forward Step Up  - 1 x daily - 3 x weekly - 2 sets - 10 reps - Lateral Step Up with Counter Support  - 1 x daily - 3 x weekly - 2 sets - 10 reps - [place] Deadlift With Barbell  - 1 x daily - 3 x weekly - 2 sets - 10 reps - Sidelying Hip Abduction  - 1 x daily - 7 x weekly - 3 sets - 10 reps - Standing Single Leg Stance with Counter Support  - 1 x daily - 7 x weekly -  1 sets - 3 reps - 30 sec  hold - Hip Hiking on Step  - 2-3 x daily - 3-4 x weekly - 2 sets - 10 reps - Step Forward with Opposite Arm Reach  - 1 x daily - 7 x weekly - 2 sets - 10 reps  ASSESSMENT:  CLINICAL IMPRESSION: Continued emphasis on upright posture when ambulating. Pr requires instruction to  avoid bending forward and to increased his step length. Also reviewed lifting mechanics with minor cues needed to adjust position with box for safety and better lifting mechanics. He is doing well, met LTG #4. [name] continues to demonstrate potential for improvement and would benefit from continued skilled therapy to address impairments.      OBJECTIVE IMPAIRMENTS: Abnormal gait, decreased activity tolerance, decreased endurance, difficulty walking, decreased ROM, decreased strength, impaired flexibility, postural dysfunction, and pain.   ACTIVITY LIMITATIONS: carrying, lifting, bending, standing, and locomotion level  PARTICIPATION LIMITATIONS: community activity and yard work  PERSONAL FACTORS: Time since onset of injury/illness/exacerbation and 1-2 comorbidities: Chronic lymphocytic leukemia, s/p L L4-5 transforaminal lumbar interbody fusion and decompression, with instrumentation and allograft on 05/17/2023, SIADH, HTN, Anxiety and Depression.  are also affecting patient's functional outcome.   REHAB POTENTIAL: Good  CLINICAL DECISION MAKING: Evolving/moderate complexity  EVALUATION COMPLEXITY: Moderate   GOALS: Goals reviewed with patient? Yes  SHORT TERM GOALS: Target date: 09/04/2023   Patient will be independent with initial HEP.  Baseline: given Goal status: MET 09/04/23   LONG TERM GOALS: Target date: 10/16/2023   Patient will be independent with advanced/ongoing HEP to improve outcomes and carryover.  Baseline:  Goal status: IN PROGRESS 08/28/23- reviewed  2.  Patient demonstrate improved posture with gait.    Baseline: forward flexed posture with gait ~ 15 deg Goal status: IN PROGRESS 10/02/23- improving, but still stands with bil knee flexion and 10 deg hip flexion.   3.  Patient will demonstrate improved hip extensor  and abductor strength to 5/5.  Baseline: see objective Goal status: IN PROGRESS 09/27/23- very weak 3/5 B  4.  Patient will be able to  lift 20lbs safely with good lifting technique to avoid lumbar strain to perform household ADLS. Baseline: has been under lifting restrictions.  Goal status: MET- 10/12/23    5.  Patient will report at least 6 points improvement on modified Oswestry to demonstrate improved functional ability.  Baseline: 6/50 Goal status: IN PROGRESS 09/27/23 5/50  6.  Patient will be able to return to all activities without limitation from LBP. Baseline: has stopped fishing Goal status: IN PROGRESS 09/27/23 PLAN:  PT FREQUENCY: 1-2x/week  PT DURATION: 8 weeks  PLANNED INTERVENTIONS: Therapeutic exercises, Therapeutic activity, Neuromuscular re-education, Balance training, Gait training, Patient/Family education, Self Care, Joint mobilization, Stair training, Orthotic/Fit training, Dry Needling, Electrical stimulation, Spinal manipulation, Spinal mobilization, Cryotherapy, Moist heat, Taping, Ultrasound, Ionotophoresis 4mg /ml Dexamethasone, Manual therapy, and Re-evaluation.  PLAN FOR NEXT SESSION: Continue core strengthening exercises, balance, posterior chain strengthening. Continue to challenge mm endurance and work towards advanced HEP/DC to independent program by end of POC   Darleene Cleaver, PTA 10/12/23 12:22 PM

## 2023-10-15 ENCOUNTER — Ambulatory Visit: Payer: Medicare HMO

## 2023-10-15 DIAGNOSIS — M6281 Muscle weakness (generalized): Secondary | ICD-10-CM

## 2023-10-15 DIAGNOSIS — M5459 Other low back pain: Secondary | ICD-10-CM | POA: Diagnosis not present

## 2023-10-15 DIAGNOSIS — R2689 Other abnormalities of gait and mobility: Secondary | ICD-10-CM

## 2023-10-15 DIAGNOSIS — M48062 Spinal stenosis, lumbar region with neurogenic claudication: Secondary | ICD-10-CM

## 2023-10-15 NOTE — Therapy (Addendum)
 OUTPATIENT PHYSICAL THERAPY TREATMENT/Discharge Summary   Patient Name: Bobby [name] MRN: [id number] DOB:1951-05-18, 72 y.o., male Today's Date: 10/15/2023     END OF SESSION:  PT End of Session - 10/15/23 1150     Visit Number 15    Date for PT Re-Evaluation 10/16/23    Authorization Type [employer] MCR    Progress Note Due on Visit 20    PT Start Time 1103    PT Stop Time 1146    PT Time Calculation (min) 43 min    Activity Tolerance Patient tolerated treatment well    Behavior During Therapy WFL for tasks assessed/performed                     Past Medical History:  Diagnosis Date   Anxiety and depression    Hypertension    Hyponatremia 03/2013   ongoing, seeing Dr. Allena Katz   Insomnia    Melanoma in situ of back [facility])    biopsy on 03/05/2020   SIADH (syndrome of inappropriate ADH production) (HCC) 2017   Dr. Allena Katz   Squamous cell carcinoma in situ    Past Surgical History:  Procedure Laterality Date   BRONCHIAL BIOPSY  11/27/2022   Procedure: BRONCHIAL BIOPSIES;  Surgeon: Leslye Peer, MD;  Location: [facility] ENDOSCOPY;  Service: Cardiopulmonary;;   BRONCHIAL BRUSHINGS  11/27/2022   Procedure: BRONCHIAL BRUSHINGS;  Surgeon: Leslye Peer, MD;  Location: [facility] ENDOSCOPY;  Service: Cardiopulmonary;;   BRONCHIAL WASHINGS  11/27/2022   Procedure: BRONCHIAL WASHINGS;  Surgeon: Leslye Peer, MD;  Location: [name];  Service: Cardiopulmonary;;   MOHS SURGERY  ~ 04/2020   face, [facility]   SHOULDER SURGERY  1980s   right shoulder   TRANSFORAMINAL LUMBAR INTERBODY FUSION (TLIF) WITH PEDICLE SCREW FIXATION 1 LEVEL Left 05/17/2023   Procedure: LEFT-SIDED LUMBAR 4- LUMBAR 5 TRANSFORAMINAL LUMBAR INTERBODY FUSION AND DECOMPRESSION WITH INSTRUMENTATION AND ALLOGRAFT;  Surgeon: Estill Bamberg, MD;  Location: [facility] OR;  Service: Orthopedics;  Laterality: Left;   VIDEO BRONCHOSCOPY N/A 11/27/2022   Procedure: VIDEO BRONCHOSCOPY WITH FLUORO;  Surgeon: Leslye Peer, MD;  Location:  [facility] ENDOSCOPY;  Service: Cardiopulmonary;  Laterality: N/A;   Patient Active Problem List   Diagnosis Date Noted   Radiculopathy of lumbar region 05/17/2023   CLL (chronic lymphocytic leukemia) (HCC) 02/21/2023   Abnormal CT of the chest 11/01/2022   Melanoma in situ of back (HCC) 03/16/2020   Squamous cell carcinoma in situ 08/21/2019   Vitamin D deficiency 05/29/2019   Cough 01/01/2016   PCP NOTES >>>> 09/28/2015   Hypogonadism in male 05/13/2015   SIADH (syndrome of inappropriate ADH production) (HCC) 04/13/2013   Memory problem 04/13/2013   Annual physical exam 03/06/2012   Idiopathic scoliosis and kyphoscoliosis 08/18/2010   BACK PAIN, LUMBAR 07/21/2010   Anxiety state 04/01/2009   Essential hypertension, benign 01/04/2009   INSOMNIA-SLEEP DISORDER-UNSPEC 09/08/2008    PCP: Wanda Plump, MD   REFERRING PROVIDER: Estill Bamberg, MD   REFERRING DIAG: S32.010A (ICD-10-CM) - Wedge compression fracture of first lumbar vertebra, initial encounter for closed fracture   Rationale for Evaluation and Treatment: Rehabilitation  THERAPY DIAG:  Other low back pain  Other abnormalities of gait and mobility  Muscle weakness (generalized)  Spinal stenosis of lumbar region with neurogenic claudication  ONSET DATE: s/p L L4-5 transforaminal lumbar interbody fusion and decompression, with instrumentation and allograft on 05/17/2023  SUBJECTIVE:  SUBJECTIVE STATEMENT:   No new complaints.  PERTINENT HISTORY:  Chronic lymphocytic leukemia, s/p L L4-5 transforaminal lumbar interbody fusion and decompression, with instrumentation and allograft on 05/17/2023, SIADH, HTN, Anxiety and Depression.  PAIN:  Are you having pain? No   PRECAUTIONS: Back  no lifting more than 30# (updated 09/27/23)   RED  FLAGS: None   WEIGHT BEARING RESTRICTIONS: No  FALLS:  Has patient fallen in last 6 months? No  LIVING ENVIRONMENT: Lives with: lives with their spouse Lives in: House/apartment Stairs: Yes: Internal: 11 steps; can reach both and External: 5 steps; on right going up Has following equipment at home: None  OCCUPATION: works at desk, on computer most of the day  PLOF: Independent; likes fly fishing  PATIENT GOALS: get back muscles stronger, walk more, get back to fly fishing and biking  NEXT MD VISIT: beginning of November  OBJECTIVE:   DIAGNOSTIC FINDINGS:  Result Date: 05/17/2023 CLINICAL DATA:  Elective surgery. EXAM: LUMBAR SPINE - 2-3 VIEW COMPARISON:  None Available. FINDINGS: Two fluoroscopic spot views of the lumbar spine obtained in the operating room in frontal and lateral projections. Posterior rod with intrapedicular screw fusion and interbody spacer at L4-L5. Fluoroscopy time 46 seconds. Dose 28.96 mGy. IMPRESSION: Intraoperative fluoroscopy during L4-L5 fusion  PATIENT SURVEYS:  Modified Oswestry 6/50; 09/27/23- 5/50    COGNITION: Overall cognitive status: Within functional limits for tasks assessed     SENSATION: WFL  MUSCLE LENGTH: Hamstrings: Right 140 deg; Left 130 deg  POSTURE: flexed trunk ~ 15 deg walking   LUMBAR ROM:   AROM eval  Flexion 85 tight hamstrings  Extension 30   Right lateral flexion To knees  Left lateral flexion To knees  Right rotation WNL  Left rotation WNL   (Blank rows = not tested)  LOWER EXTREMITY ROM:   very good hip mobility for both ER and IR, symmetric and pain free   LOWER EXTREMITY MMT:    MMT Right Eval Left Eval Right 09/27/23 Left 09/27/23  Hip flexion 5 5 5 5   Hip extension 4+ 4+ 3 3  Hip abduction 4* 4*  3 3  Hip adduction 5 5    Knee flexion 5 5 5 5   Knee extension 5 5 5 5   Ankle dorsiflexion 5 5    Ankle plantarflexion 5 5     (Blank rows = not tested) * seated  FUNCTIONAL TESTS:  6 minute walk  test: 1025' Functional gait assessment: 22/30; 09/27/23 20/30   GAIT: Distance walked: 71' Assistive device utilized: None Level of assistance: Complete Independence Comments: maintains forward flexion   TODAY'S TREATMENT:                                                                                                                              DATE: 10/15/23 Therapeutic Exercise: to improve strength and mobility.  Demo, verbal and tactile cues throughout for technique. Bike L3 x 6 min Modified Oswestry  Low Back Pain Disability Questionnaire: 8 / 50 = 16.0 % LE strength Lateral step downs 6' x 10  Seated hip ABD with gait belt - demo  10/12/23  Therapeutic Exercise: to improve strength and mobility.  Demo, verbal and tactile cues throughout for technique. Bike L3 x 6 min 3 way shoulder flexion with yellow wt ball back to wall x 10  Fwd step and reach x 10 bil Backward step and reach x 10 bil Box lifting- 20.6lb 5x instruction given for safe and proper lifting mechanics Gait Training: 270 ft- cues for upright posture, increased stride length, arm swing   10/09/23 Neuromuscular Reeducation: to improve balance and stability. SBA for safety throughout.   Bike L3 x 6 min for warm-up/muscle perfusion In hallway - with one hand on wall as needed for stability and SBA Forward tandem walking Backward tandem walking Side stepping with squat - ball toss back and forth to therapist for dual task Forward walking with ball toss- SBA, to encourage looking up and back extension Backward walking with ball toss to encourage back extension- SBA Forward step and reach x 10 bil - for back extension - added to HEP.   10/05/23 Therapeutic Exercise: to improve strength and mobility.  Demo, verbal and tactile cues throughout for technique. Bike L3 x 6 min  SLS 2x15" each LE with UE support Toe taps to yoga block x 10; to 9' stool x 10  Unilateral farmer carry 10lb 73' each arm; then 15lb each  arm for 90' Monster walks GTB at knees Standing hip abduction RTB x 10  Hip hike on step 4'x 10 bil  10/02/23 Therapeutic Exercise: to improve strength and mobility.  Demo, verbal and tactile cues throughout for technique. Bike L3 x 7 min  SL hip abduction x 10 bil - very challenging, added to [employer] walks with GTB, 3 laps, cues for speed, CGA needed for balance Neuromuscular Reeducation: to improve balance and stability. SBA for safety throughout.  Star excursion 180 deg front to back progressing from stepping to tap to encourage SLS Attempted single leg RDL with table in front for safety, could not transition into SLS SL stance at counter x 30 sec bil - added to HEP   PATIENT EDUCATION:  Education details: HEP update Person educated: Patient Education method: Explanation, Demonstration, Verbal cues, and Handouts Education comprehension: verbalized understanding and returned demonstration  HOME EXERCISE PROGRAM: Access Code: [email or web address] URL: [email or web address] Date: 10/15/2023 Prepared by: Verta Ellen  Exercises - Supine Posterior Pelvic Tilt  - 1-2 x daily - 3 x weekly - 2 sets - 10 reps - 5 sec hold - Supine Bridge  - 1 x daily - 3 x weekly - 2 sets - 10 reps - Supine Active Straight Leg Raise  - 1 x daily - 3 x weekly - 2 sets - 10 reps - Bent Knee Fallouts  - 1 x daily - 3 x weekly - 2 sets - 10 reps - Supine 90/90 Sciatic Nerve Glide with Knee Flexion/Extension  - 1 x daily - 3 x weekly - 1 sets - 10 reps - Bird Dog Progression  - 1 x daily - 3 x weekly - 2 sets - 10 reps - Forward Step Up  - 1 x daily - 3 x weekly - 2 sets - 10 reps - Lateral Step Up with Counter Support  - 1 x daily - 3 x weekly - 2 sets - 10 reps - [place] Deadlift With [employer]  -  1 x daily - 3 x weekly - 2 sets - 10 reps - Sidelying Hip Abduction  - 1 x daily - 7 x weekly - 3 sets - 10 reps - Standing Single Leg Stance with Counter Support  - 1 x daily - 7 x weekly - 1 sets - 3  reps - 30 sec  hold - Hip Hiking on Step  - 2-3 x daily - 3-4 x weekly - 2 sets - 10 reps - Step Forward with Opposite Arm Reach  - 1 x daily - 7 x weekly - 2 sets - 10 reps - Lateral Step Up with Counter Support  - 1 x daily - 7 x weekly - 3 sets - 10 reps - Seated Isometric Hip Abduction with Belt  - 1 x daily - 7 x weekly - 3 sets - 10 reps  ASSESSMENT:  CLINICAL IMPRESSION: Mr. Masterson shows improvement in his Oswestry score by two points. Continues to show weakness in B hip abductors and extensors. He is able to do all of his functional activities with no limitation. His gait continues to show flexed trunk with shortened strides w/o correction but once corrected he is able to demo a more stable gait pattern. Gave progressions for his HEP to target ongoing hip weakness. D/t functional improvements he will opt for a 30 day hold.     OBJECTIVE IMPAIRMENTS: Abnormal gait, decreased activity tolerance, decreased endurance, difficulty walking, decreased ROM, decreased strength, impaired flexibility, postural dysfunction, and pain.   ACTIVITY LIMITATIONS: carrying, lifting, bending, standing, and locomotion level  PARTICIPATION LIMITATIONS: community activity and yard work  PERSONAL FACTORS: Time since onset of injury/illness/exacerbation and 1-2 comorbidities: Chronic lymphocytic leukemia, s/p L L4-5 transforaminal lumbar interbody fusion and decompression, with instrumentation and allograft on 05/17/2023, SIADH, HTN, Anxiety and Depression.  are also affecting patient's functional outcome.   REHAB POTENTIAL: Good  CLINICAL DECISION MAKING: Evolving/moderate complexity  EVALUATION COMPLEXITY: Moderate   GOALS: Goals reviewed with patient? Yes  SHORT TERM GOALS: Target date: 09/04/2023   Patient will be independent with initial HEP.  Baseline: given Goal status: MET 09/04/23   LONG TERM GOALS: Target date: 10/16/2023   Patient will be independent with advanced/ongoing HEP to  improve outcomes and carryover.  Baseline:  Goal status: IN PROGRESS 08/28/23- reviewed  2.  Patient demonstrate improved posture with gait.    Baseline: forward flexed posture with gait ~ 15 deg Goal status: IN PROGRESS 10/02/23- improving, but still stands with bil knee flexion and 10 deg hip flexion.   3.  Patient will demonstrate improved hip extensor  and abductor strength to 5/5.  Baseline: see objective Goal status: IN PROGRESS 09/27/23- very weak 3/5 B  4.  Patient will be able to lift 20lbs safely with good lifting technique to avoid lumbar strain to perform household ADLS. Baseline: has been under lifting restrictions.  Goal status: MET- 10/12/23    5.  Patient will report at least 6 points improvement on modified Oswestry to demonstrate improved functional ability.  Baseline: 6/50 Goal status: IN PROGRESS 10/15/23 Modified Oswestry Low Back Pain Disability Questionnaire: 8 / 50 = 16.0 %  6.  Patient will be able to return to all activities without limitation from LBP. Baseline: has stopped fishing Goal status: MET 10/15/23 PLAN:  PT FREQUENCY: 1-2x/week  PT DURATION: 8 weeks  PLANNED INTERVENTIONS: Therapeutic exercises, Therapeutic activity, Neuromuscular re-education, Balance training, Gait training, Patient/Family education, Self Care, Joint mobilization, Stair training, Orthotic/Fit training, Dry Needling, Electrical stimulation,  Spinal manipulation, Spinal mobilization, Cryotherapy, Moist heat, Taping, Ultrasound, Ionotophoresis 4mg /ml Dexamethasone, Manual therapy, and Re-evaluation.  PLAN FOR NEXT SESSION: Continue core strengthening exercises, balance, posterior chain strengthening. Continue to challenge mm endurance and work towards advanced HEP/DC to independent program by end of POC   [employer], PTA 10/15/23 11:50 AM  PHYSICAL THERAPY DISCHARGE SUMMARY  Visits from Start of Care: 15  Current functional level related to goals / functional  outcomes: See above   Remaining deficits: See above   Education / Equipment: HEP  Plan: Refer to above clinical impression and goal assessment for status as of last visit on 10/15/2023. Patient was placed on hold for 30 days and has not needed to return to PT, therefore will proceed with discharge from PT for this episode.     Jena Gauss, PT  01/10/2024 5:32 PM

## 2023-10-26 ENCOUNTER — Telehealth: Payer: Self-pay | Admitting: Internal Medicine

## 2023-10-26 ENCOUNTER — Telehealth: Payer: Self-pay | Admitting: Family

## 2023-10-26 NOTE — Telephone Encounter (Signed)
Requesting: Ambien 10mg   Contract: 02/21/23 UDS:1 02/21/23 Last Visit: 02/21/23 Next Visit: 02/27/24 Last Refill: 05/08/23 #90 and 1RF   Please Advise

## 2023-10-26 NOTE — Telephone Encounter (Signed)
It is slightly early but I went ahead and refilled Ambien

## 2023-10-29 ENCOUNTER — Ambulatory Visit (HOSPITAL_COMMUNITY): Payer: Medicare HMO

## 2023-10-31 DIAGNOSIS — Z885 Allergy status to narcotic agent status: Secondary | ICD-10-CM | POA: Diagnosis not present

## 2023-10-31 DIAGNOSIS — C9510 Chronic leukemia of unspecified cell type not having achieved remission: Secondary | ICD-10-CM | POA: Diagnosis not present

## 2023-10-31 DIAGNOSIS — D6949 Other primary thrombocytopenia: Secondary | ICD-10-CM | POA: Diagnosis not present

## 2023-10-31 DIAGNOSIS — D039 Melanoma in situ, unspecified: Secondary | ICD-10-CM | POA: Diagnosis not present

## 2023-10-31 DIAGNOSIS — G4700 Insomnia, unspecified: Secondary | ICD-10-CM | POA: Diagnosis not present

## 2023-10-31 DIAGNOSIS — F411 Generalized anxiety disorder: Secondary | ICD-10-CM | POA: Diagnosis not present

## 2023-10-31 DIAGNOSIS — I700 Atherosclerosis of aorta: Secondary | ICD-10-CM | POA: Diagnosis not present

## 2023-10-31 DIAGNOSIS — L299 Pruritus, unspecified: Secondary | ICD-10-CM | POA: Diagnosis not present

## 2023-10-31 DIAGNOSIS — E222 Syndrome of inappropriate secretion of antidiuretic hormone: Secondary | ICD-10-CM | POA: Diagnosis not present

## 2023-10-31 DIAGNOSIS — Z85828 Personal history of other malignant neoplasm of skin: Secondary | ICD-10-CM | POA: Diagnosis not present

## 2023-10-31 DIAGNOSIS — Z79620 Long term (current) use of immunosuppressive biologic: Secondary | ICD-10-CM | POA: Diagnosis not present

## 2023-10-31 DIAGNOSIS — I10 Essential (primary) hypertension: Secondary | ICD-10-CM | POA: Diagnosis not present

## 2023-11-02 NOTE — Telephone Encounter (Signed)
PDMP okay, Rx sent 

## 2023-11-02 NOTE — Telephone Encounter (Signed)
Rerouted per pt follow up call. Routed HP due to routing error from pharmacy.

## 2023-11-02 NOTE — Telephone Encounter (Signed)
Requesting: clonazepam 0.5mg   Contract: 02/21/23 UDS: 02/21/23 Last Visit: 02/21/23 Next Visit: 02/27/24 Last Refill: 08/22/23 #60 and 0RF   Please Advise

## 2023-11-15 ENCOUNTER — Other Ambulatory Visit: Payer: Self-pay | Admitting: Internal Medicine

## 2023-11-15 NOTE — Telephone Encounter (Signed)
  Last Refill: 10/26/2023 #90 no refills  Too soon for refill. Please sign refusal.

## 2023-11-19 NOTE — Telephone Encounter (Signed)
done

## 2023-12-12 ENCOUNTER — Inpatient Hospital Stay (HOSPITAL_BASED_OUTPATIENT_CLINIC_OR_DEPARTMENT_OTHER): Payer: Medicare HMO | Admitting: Hematology & Oncology

## 2023-12-12 ENCOUNTER — Inpatient Hospital Stay: Payer: Medicare HMO | Attending: Hematology & Oncology

## 2023-12-12 ENCOUNTER — Other Ambulatory Visit: Payer: Self-pay

## 2023-12-12 VITALS — BP 150/84 | HR 60 | Temp 97.6°F | Resp 18 | Ht 72.0 in | Wt 181.0 lb

## 2023-12-12 DIAGNOSIS — M549 Dorsalgia, unspecified: Secondary | ICD-10-CM | POA: Insufficient documentation

## 2023-12-12 DIAGNOSIS — C9110 Chronic lymphocytic leukemia of B-cell type not having achieved remission: Secondary | ICD-10-CM | POA: Diagnosis not present

## 2023-12-12 DIAGNOSIS — D72820 Lymphocytosis (symptomatic): Secondary | ICD-10-CM | POA: Diagnosis not present

## 2023-12-12 LAB — CMP (CANCER CENTER ONLY)
ALT: 19 U/L (ref 0–44)
AST: 24 U/L (ref 15–41)
Albumin: 4.7 g/dL (ref 3.5–5.0)
Alkaline Phosphatase: 60 U/L (ref 38–126)
Anion gap: 5 (ref 5–15)
BUN: 14 mg/dL (ref 8–23)
CO2: 33 mmol/L — ABNORMAL HIGH (ref 22–32)
Calcium: 9.7 mg/dL (ref 8.9–10.3)
Chloride: 97 mmol/L — ABNORMAL LOW (ref 98–111)
Creatinine: 0.94 mg/dL (ref 0.61–1.24)
GFR, Estimated: >60 mL/min (ref >60)
Glucose, Bld: 92 mg/dL (ref 70–99)
Potassium: 4.5 mmol/L (ref 3.5–5.1)
Sodium: 135 mmol/L (ref 135–145)
Total Bilirubin: 0.7 mg/dL (ref 0.0–1.2)
Total Protein: 6.8 g/dL (ref 6.5–8.1)

## 2023-12-12 LAB — CBC WITH DIFFERENTIAL (CANCER CENTER ONLY)
Abs Immature Granulocytes: 0.03 10*3/uL (ref 0.00–0.07)
Basophils Absolute: 0.1 10*3/uL (ref 0.0–0.1)
Basophils Relative: 0 %
Eosinophils Absolute: 0.2 10*3/uL (ref 0.0–0.5)
Eosinophils Relative: 1 %
HCT: 39.8 % (ref 39.0–52.0)
Hemoglobin: 14.1 g/dL (ref 13.0–17.0)
Immature Granulocytes: 0 %
Lymphocytes Relative: 74 %
Lymphs Abs: 14.1 10*3/uL — ABNORMAL HIGH (ref 0.7–4.0)
MCH: 32.7 pg (ref 26.0–34.0)
MCHC: 35.4 g/dL (ref 30.0–36.0)
MCV: 92.3 fL (ref 80.0–100.0)
Monocytes Absolute: 0.6 10*3/uL (ref 0.1–1.0)
Monocytes Relative: 3 %
Neutro Abs: 4.2 10*3/uL (ref 1.7–7.7)
Neutrophils Relative %: 22 %
Platelet Count: 209 10*3/uL (ref 150–400)
RBC: 4.31 MIL/uL (ref 4.22–5.81)
RDW: 12.8 % (ref 11.5–15.5)
Smear Review: NORMAL
WBC Count: 19.3 10*3/uL — ABNORMAL HIGH (ref 4.0–10.5)
nRBC: 0 % (ref 0.0–0.2)

## 2023-12-12 LAB — SAVE SMEAR(SSMR), FOR PROVIDER SLIDE REVIEW

## 2023-12-12 LAB — LACTATE DEHYDROGENASE: LDH: 146 U/L (ref 98–192)

## 2023-12-12 NOTE — Progress Notes (Signed)
Hematology and Oncology Follow Up Visit  Bobby [name] [id number] 14-Oct-1951 73 y.o. 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  We last saw him back in September.  Since then, he has been doing well.  His back is doing a lot better.  He had his back surgery back in June.  I told that it would be okay for him to exercise.  I do not see a problem with him going to the gym.  He has had no problem with fever.  He had no problems over the Holiday season.  He has not noted any rashes.  He has had no leg swelling.  He has had no change in bowel or bladder habits.  He has had no problems with cough.  Overall, I would have to say that his performance status about ECOG 1.    Wt Readings from Last 3 Encounters:  12/12/23 181 lb (82.1 kg)  08/15/23 178 lb (80.7 kg)  05/17/23 175 lb 8 oz (79.6 kg)     Medications:  Current Outpatient Medications:    atorvastatin (LIPITOR) 20 MG tablet, Take 1 tablet (20 mg total) by mouth at bedtime., Disp: 90 tablet, Rfl: 1   b complex vitamins capsule, Take 1 capsule by mouth daily., Disp: , Rfl:    carvedilol (COREG) 6.25 MG tablet, Take 1 tablet (6.25 mg total) by mouth 2 (two) times daily with a meal., Disp: 180 tablet, Rfl: 3   cholecalciferol (VITAMIN D3) 25 MCG (1000 UNIT) tablet, Take 1,000 Units by mouth daily., Disp: , Rfl:    clonazePAM (KLONOPIN) 0.5 MG tablet, TAKE ONE TABLET BY MOUTH TWICE A DAY AS NEEDED FOR ANXIETY, Disp: 60 tablet, Rfl: 2   DUPIXENT 300 MG/2ML SOPN, Inject into the skin every 14 (fourteen) days., Disp: , Rfl:    folic acid (FOLVITE) 1 MG tablet, Take 1 tablet (1 mg total) by mouth daily., Disp: 90 tablet, Rfl: 2   Multiple Vitamin (MULTIVITAMIN WITH MINERALS) TABS, Take 1 tablet by mouth daily., Disp: , Rfl:    tadalafil (CIALIS) 10 MG tablet, TAKE ONE TO TWO TABLETS BY MOUTH EVERY  OTHER DAY AS NEEDED, Disp: 20 tablet, Rfl: 3   zolpidem (AMBIEN) 10 MG tablet, TAKE 1 TABLET BY MOUTH EVERY NIGHT AT BEDTIME AS NEEDED FOR SLEEP, Disp: 90 tablet, Rfl: 0  Allergies:  Allergies  Allergen Reactions   Tramadol Hives    Past Medical History, Surgical history, Social history, and Family History were reviewed and updated.  Review of Systems: Review of Systems  Constitutional: Negative.   HENT:  Negative.    Eyes: Negative.   Respiratory: Negative.    Cardiovascular: Negative.   Gastrointestinal: Negative.   Endocrine: Negative.   Genitourinary: Negative.    Musculoskeletal:  Positive for back pain.  Skin: Negative.   Neurological: Negative.   Hematological: Negative.   Psychiatric/Behavioral: Negative.      Physical Exam:  height is 6' (1.829 m) and weight is 181 lb (82.1 kg). His oral temperature is 97.6 F (36.4 C). His blood pressure is 150/84 (abnormal) and his pulse is 60. His respiration is 18 and oxygen saturation is 100%.   Wt Readings from Last 3 Encounters:  12/12/23 181 lb (82.1 kg)  08/15/23 178 lb (80.7 kg)  05/17/23 175 lb 8 oz (79.6 kg)    Physical Exam Vitals reviewed.  HENT:  Head: Normocephalic and atraumatic.  Eyes:     Pupils: Pupils are equal, round, and reactive to light.  Cardiovascular:     Rate and Rhythm: Normal rate and regular rhythm.     Heart sounds: Normal heart sounds.  Pulmonary:     Effort: Pulmonary effort is normal.     Breath sounds: Normal breath sounds.  Abdominal:     General: Bowel sounds are normal.     Palpations: Abdomen is soft.  Musculoskeletal:        General: No tenderness or deformity. Normal range of motion.     Cervical back: Normal range of motion.     Comments: On the back, he has a healing lumbar laminectomy scar.  Lymphadenopathy:     Cervical: No cervical adenopathy.  Skin:    General: Skin is warm and dry.     Findings: No erythema or rash.  Neurological:     Mental Status: He is  alert and oriented to person, place, and time.  Psychiatric:        Behavior: Behavior normal.        Thought Content: Thought content normal.        Judgment: Judgment normal.     Lab Results  Component Value Date   WBC 19.3 (H) 12/12/2023   HGB 14.1 12/12/2023   HCT 39.8 12/12/2023   MCV 92.3 12/12/2023   PLT 209 12/12/2023     Chemistry      Component Value Date/Time   NA 135 12/12/2023 1208   NA 133 (A) 09/19/2023 0000   K 4.5 12/12/2023 1208   CL 97 (L) 12/12/2023 1208   CO2 33 (H) 12/12/2023 1208   BUN 14 12/12/2023 1208   BUN 16 09/19/2023 0000   CREATININE 0.94 12/12/2023 1208   GLU 110 09/19/2023 0000      Component Value Date/Time   CALCIUM 9.7 12/12/2023 1208   ALKPHOS 60 12/12/2023 1208   AST 24 12/12/2023 1208   ALT 19 12/12/2023 1208   BILITOT 0.7 12/12/2023 1208       Impression and Plan: Bobby [name] is a very nice 73 year old white male.  He has a monoclonal B-cell population of lymphocytes.  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  For right now, I just do not see any indication that he needs to be treated.  We will continue to follow him along.  I will plan to see him back in the Spring.  Hopefully, we can hold off on any kind of treatment for a couple years, if not longer.  Hopefully, he will start to exercise little bit more.  I told him that this is incredibly important for him to exercise.   Bobby Macho, MD 1/22/202512:59 PM

## 2023-12-19 ENCOUNTER — Encounter: Payer: Self-pay | Admitting: Emergency Medicine

## 2023-12-19 ENCOUNTER — Ambulatory Visit: Payer: Medicare HMO | Admitting: Emergency Medicine

## 2023-12-19 VITALS — BP 132/70 | HR 58 | Ht 72.0 in | Wt 184.8 lb

## 2023-12-19 DIAGNOSIS — R9389 Abnormal findings on diagnostic imaging of other specified body structures: Secondary | ICD-10-CM

## 2023-12-19 NOTE — Assessment & Plan Note (Signed)
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  His cultures on bronchoscopy 1 year ago were negative.  His CT chest from November is stable to improved and he has no symptoms.  I think we can continue to do surveillance clinically and radiographically, next in November 2025.  We reviewed your CT scan of the chest today.  This is stable to improved compared with your prior.  There is some evidence for subtle inflammation present. We will plan to repeat your CT scan of the chest in November 2025 to follow for interval stability Please call us and let us know if you develop any new symptoms including fevers, chills, unexplained weight loss, sweats, or any new respiratory symptoms.  If these develop then we may decide to repeat your CT scan of the chest sooner. Follow with Dr. Delton Coombes in November after your CT so we can review those results together

## 2023-12-19 NOTE — Progress Notes (Signed)
Subjective:    Patient ID: Bobby [name], male    DOB: January 29, 1951, 73 y.o.   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 Myna Hidalgo.  He had a reassuring bronchoscopy 11/27/2022 with random transbronchial biopsies negative for granulomas.  He has subtle bronchiectasis and micronodular disease in a tree-in-bud pattern on his imaging, suspicious for Mycobacterium colonization although cultures negative. He feels well. No constitutional sx, no resp sx.   CT chest 10/10/2023 reviewed by me shows no significant change in scattered pulmonary nodules in the right lung up to 6 mm.  No new nodules.  The bronchiectasis and scattered tree-in-bud opacities are unchanged on the right, resolved on the left.   Review of Systems As per HPI  Past Medical History:  Diagnosis Date   Anxiety and depression    Hypertension    Hyponatremia 03/2013   ongoing, seeing Dr. Allena Katz   Insomnia    Melanoma in situ of back [facility])    biopsy on 03/05/2020   SIADH (syndrome of inappropriate ADH production) (HCC) 2017   Dr. Allena Katz   Squamous cell carcinoma in situ      Family History  Problem Relation Age of Onset   Hypertension Mother    Stroke Mother    Diabetes Mother    Emphysema Mother        smoked   Thyroid disease Mother    Alcoholism Father    Colon cancer Neg Hx    Prostate cancer Neg Hx    CAD Neg Hx      Social History   Socioeconomic History   Marital status: Married    Spouse name: Not on file   Number of children: 2   Years of education: 16   Highest education level: Bachelor's degree (e.g., BA, AB, BS)  Occupational History   Occupation: semi-retired--business owner   Tobacco Use   Smoking status: Never   Smokeless tobacco: Never  Vaping Use   Vaping status: Never Used  Substance and Sexual Activity   Alcohol use: Yes    Comment: 1-2 qd    Drug use: No   Sexual activity: Yes  Other Topics Concern   Not on file  Social History Narrative   "[name]", married    2 children   mom passed away 2008-05-22  Right-handed.   2-3 cups coffee per day.   Lives at home with wife.            Social Drivers of Corporate investment banker Strain: Low Risk  (08/22/2023)   Overall Financial Resource Strain (CARDIA)    Difficulty of Paying Living Expenses: Not hard at all  Food Insecurity: No Food Insecurity (08/22/2023)   Hunger Vital Sign    Worried About Running Out of Food in the Last Year: Never true    Ran Out of Food in the Last Year: Never true  Transportation Needs: No Transportation Needs (08/22/2023)   PRAPARE - Administrator, Civil Service (Medical): No    Lack of Transportation (Non-Medical): No  Physical Activity: Unknown (08/22/2023)   Exercise Vital Sign    Days of Exercise per Week: 4 days    Minutes of Exercise per Session: Patient declined  Stress: No Stress Concern Present (08/22/2023)   [employer] of Occupational Health - Occupational Stress Questionnaire    Feeling of Stress : Not  at all  Social Connections: Unknown (08/22/2023)   Social Connection and Isolation Panel [NHANES]    Frequency of Communication with Friends and Family: More than three times a week    Frequency of Social Gatherings with Friends and Family: Three times a week    Attends Religious Services: Not on file    Active Member of Clubs or Organizations: Patient declined    Attends Banker Meetings: Patient declined    Marital Status: Married  Catering manager Violence: Not At Risk (08/22/2023)   Humiliation, Afraid, Rape, and Kick questionnaire    Fear of Current or Ex-Partner: No    Emotionally Abused: No    Physically Abused: No    Sexually Abused: No     Allergies  Allergen Reactions   Tramadol Hives     Outpatient Medications Prior to Visit  Medication Sig Dispense Refill   b complex vitamins  capsule Take 1 capsule by mouth daily.     carvedilol (COREG) 6.25 MG tablet Take 1 tablet (6.25 mg total) by mouth 2 (two) times daily with a meal. 180 tablet 3   cholecalciferol (VITAMIN D3) 25 MCG (1000 UNIT) tablet Take 1,000 Units by mouth daily.     clonazePAM (KLONOPIN) 0.5 MG tablet TAKE ONE TABLET BY MOUTH TWICE A DAY AS NEEDED FOR ANXIETY 60 tablet 2   DUPIXENT 300 MG/2ML SOPN Inject into the skin every 14 (fourteen) days.     folic acid (FOLVITE) 1 MG tablet Take 1 tablet (1 mg total) by mouth daily. 90 tablet 2   Multiple Vitamin (MULTIVITAMIN WITH MINERALS) TABS Take 1 tablet by mouth daily.     tadalafil (CIALIS) 10 MG tablet TAKE ONE TO TWO TABLETS BY MOUTH EVERY OTHER DAY AS NEEDED 20 tablet 3   zolpidem (AMBIEN) 10 MG tablet TAKE 1 TABLET BY MOUTH EVERY NIGHT AT BEDTIME AS NEEDED FOR SLEEP 90 tablet 0   atorvastatin (LIPITOR) 20 MG tablet Take 1 tablet (20 mg total) by mouth at bedtime. (Patient not taking: Reported on 12/19/2023) 90 tablet 1   No facility-administered medications prior to visit.        Objective:   Physical Exam Vitals:   12/19/23 1344  BP: 132/70  Pulse: (!) 58  SpO2: 100%  Weight: 184 lb 12.8 oz (83.8 kg)  Height: 6' (1.829 m)     Gen: Pleasant, well-nourished, in no distress,  normal affect  ENT: No lesions,  mouth clear,  oropharynx clear, no postnasal drip  Neck: No JVD, no stridor  Lungs: No use of accessory muscles, no crackles or wheezing on normal respiration, no wheeze on forced expiration  Cardiovascular: RRR, heart sounds normal, no murmur or gallops, no peripheral edema  Musculoskeletal: No deformities, no cyanosis or clubbing  Neuro: alert, awake, non focal  Skin: Warm, no lesions or rash      Assessment & Plan:   Abnormal CT of the chest Micronodular disease and some associated mild bronchiectasis that are suggestive of possible Mycobacterium.  His cultures on bronchoscopy 1 year ago were negative.  His CT chest from  November is stable to improved and he has no symptoms.  I think we can continue to do surveillance clinically and radiographically, next in November 2025.  We reviewed your CT scan of the chest today.  This is stable to improved compared with your prior.  There is some evidence for subtle inflammation present. We will plan to repeat your CT scan of the chest in November 2025  to follow for interval stability Please call us and let us know if you develop any new symptoms including fevers, chills, unexplained weight loss, sweats, or any new respiratory symptoms.  If these develop then we may decide to repeat your CT scan of the chest sooner. Follow with Dr. Delton Coombes in November after your CT so we can review those results together    Levy Pupa, MD, PhD 12/19/2023, 2:00 PM [facility] [phone] or if no answer before 7:00PM call [phone] For any issues after 7:00PM please call eLink [phone]

## 2023-12-19 NOTE — Patient Instructions (Signed)
We reviewed your CT scan of the chest today.  This is stable to improved compared with your prior.  There is some evidence for subtle inflammation present. We will plan to repeat your CT scan of the chest in November 2025 to follow for interval stability Please call us and let us know if you develop any new symptoms including fevers, chills, unexplained weight loss, sweats, or any new respiratory symptoms.  If these develop then we may decide to repeat your CT scan of the chest sooner. Follow with Dr. Delton Coombes in November after your CT so we can review those results together

## 2023-12-31 ENCOUNTER — Ambulatory Visit (INDEPENDENT_AMBULATORY_CARE_PROVIDER_SITE_OTHER): Payer: Medicare HMO | Admitting: Physician Assistant

## 2023-12-31 VITALS — BP 154/80 | HR 59 | Temp 97.7°F | Ht 72.0 in | Wt 187.1 lb

## 2023-12-31 DIAGNOSIS — H6991 Unspecified Eustachian tube disorder, right ear: Secondary | ICD-10-CM

## 2023-12-31 MED ORDER — FLUTICASONE PROPIONATE 50 MCG/ACT NA SUSP: 2.0000 | Freq: Every day | NASAL | 0 refills | Status: DC

## 2023-12-31 NOTE — Progress Notes (Signed)
Established patient visit   Patient: Bobby [name]   DOB: 1951-02-02   73 y.o. Male  MRN: [id number] Visit Date: 12/31/2023  Today's healthcare provider: Alfredia Ferguson, PA-C   Chief Complaint  Patient presents with   Ear Pain    Complains of ear ache- right ear. States some drainage going on. Present for about a week   Subjective    Pt reports intermittent right ear pain for a week. Reports PND. Denies hearing changes, muffled sound. No other URI symptoms.  Medications: Outpatient Medications Prior to Visit  Medication Sig   atorvastatin (LIPITOR) 20 MG tablet Take 1 tablet (20 mg total) by mouth at bedtime.   b complex vitamins capsule Take 1 capsule by mouth daily.   carvedilol (COREG) 6.25 MG tablet Take 1 tablet (6.25 mg total) by mouth 2 (two) times daily with a meal.   cholecalciferol (VITAMIN D3) 25 MCG (1000 UNIT) tablet Take 1,000 Units by mouth daily.   clonazePAM (KLONOPIN) 0.5 MG tablet TAKE ONE TABLET BY MOUTH TWICE A DAY AS NEEDED FOR ANXIETY   DUPIXENT 300 MG/2ML SOPN Inject into the skin every 14 (fourteen) days.   folic acid (FOLVITE) 1 MG tablet Take 1 tablet (1 mg total) by mouth daily.   Multiple Vitamin (MULTIVITAMIN WITH MINERALS) TABS Take 1 tablet by mouth daily.   tadalafil (CIALIS) 10 MG tablet TAKE ONE TO TWO TABLETS BY MOUTH EVERY OTHER DAY AS NEEDED   zolpidem (AMBIEN) 10 MG tablet TAKE 1 TABLET BY MOUTH EVERY NIGHT AT BEDTIME AS NEEDED FOR SLEEP   No facility-administered medications prior to visit.    Review of Systems  Constitutional:  Negative for fatigue and fever.  HENT:  Positive for ear pain.   Respiratory:  Negative for cough and shortness of breath.   Cardiovascular:  Negative for chest pain, palpitations and leg swelling.  Neurological:  Negative for dizziness and headaches.       Objective    BP (!) 154/80   Pulse (!) 59   Temp 97.7 F (36.5 C) (Oral)   Ht 6' (1.829 m)   Wt 187 lb 2 oz (84.9 kg)   SpO2 98%    BMI 25.38 kg/m    Physical Exam Vitals reviewed.  Constitutional:      Appearance: He is not ill-appearing.  HENT:     Head: Normocephalic.     Right Ear: Tympanic membrane normal.     Left Ear: Tympanic membrane normal.     Mouth/Throat:     Pharynx: No oropharyngeal exudate or posterior oropharyngeal erythema.  Eyes:     Conjunctiva/sclera: Conjunctivae normal.  Cardiovascular:     Rate and Rhythm: Normal rate.  Pulmonary:     Effort: Pulmonary effort is normal. No respiratory distress.  Neurological:     General: No focal deficit present.     Mental Status: He is alert and oriented to person, place, and time.  Psychiatric:        Mood and Affect: Mood normal.        Behavior: Behavior normal.      No results found for any visits on 12/31/23.  Assessment & Plan    Eustachian tube dysfunction, right -     Fluticasone Propionate; Place 2 sprays into both nostrils daily.  Dispense: 16 g; Refill: 0  Recommending otc antihistamines, flonase.   Return if symptoms worsen or fail to improve.       Alfredia Ferguson, PA-C  [facility] [phone] (phone) [phone] (fax)  [facility]

## 2024-01-02 ENCOUNTER — Encounter: Payer: Self-pay | Admitting: Physician Assistant

## 2024-01-03 ENCOUNTER — Telehealth (HOSPITAL_BASED_OUTPATIENT_CLINIC_OR_DEPARTMENT_OTHER): Payer: Self-pay

## 2024-01-31 ENCOUNTER — Telehealth: Payer: Self-pay | Admitting: Internal Medicine

## 2024-01-31 NOTE — Telephone Encounter (Signed)
 Requesting: Ambien 10mg   Contract: 02/27/23 UDS: Ambien only  Last Visit: 02/21/23 Next Visit: 02/27/24 Last Refill: 10/26/23 #90 and 0RF    Please Advise

## 2024-01-31 NOTE — Telephone Encounter (Signed)
 PDMP okay, Rx sent

## 2024-02-04 ENCOUNTER — Telehealth: Payer: Self-pay

## 2024-02-04 MED ORDER — CLONAZEPAM 0.5 MG PO TABS
ORAL_TABLET | ORAL | 2 refills | Status: DC
Start: 2024-02-04 — End: 2024-05-28

## 2024-02-04 NOTE — Telephone Encounter (Signed)
 Requesting: clonazepam 0.5mg   Contract:02/21/23 UDS:02/21/23 Last Visit: 02/21/23 Next Visit: 02/27/24 Last Refill: 11/02/23 #60 and 2RF   Please Advise

## 2024-02-04 NOTE — Telephone Encounter (Signed)
 Copied from CRM [id number]. Topic: Clinical - Prescription Issue >> Feb 04, 2024 12:27 PM Eunice Blase wrote: Reason for CRM: Pt called would like a call back regarding clonazePAM (KLONOPIN) 0.5 MG tablet. Pt states he only has one weeks worth of medication. Please call pt at [phone].

## 2024-02-04 NOTE — Telephone Encounter (Signed)
 PDMP okay, Rx sent

## 2024-02-27 ENCOUNTER — Ambulatory Visit (INDEPENDENT_AMBULATORY_CARE_PROVIDER_SITE_OTHER): Payer: Medicare HMO | Admitting: Internal Medicine

## 2024-02-27 ENCOUNTER — Encounter: Payer: Self-pay | Admitting: Internal Medicine

## 2024-02-27 ENCOUNTER — Other Ambulatory Visit (HOSPITAL_BASED_OUTPATIENT_CLINIC_OR_DEPARTMENT_OTHER): Payer: Self-pay

## 2024-02-27 VITALS — BP 132/82 | HR 61 | Temp 98.3°F | Resp 16 | Ht 72.0 in | Wt 185.5 lb

## 2024-02-27 DIAGNOSIS — Z23 Encounter for immunization: Secondary | ICD-10-CM | POA: Diagnosis not present

## 2024-02-27 DIAGNOSIS — Z0000 Encounter for general adult medical examination without abnormal findings: Secondary | ICD-10-CM

## 2024-02-27 DIAGNOSIS — F411 Generalized anxiety disorder: Secondary | ICD-10-CM | POA: Diagnosis not present

## 2024-02-27 DIAGNOSIS — I10 Essential (primary) hypertension: Secondary | ICD-10-CM | POA: Diagnosis not present

## 2024-02-27 DIAGNOSIS — Z0001 Encounter for general adult medical examination with abnormal findings: Secondary | ICD-10-CM

## 2024-02-27 DIAGNOSIS — G4700 Insomnia, unspecified: Secondary | ICD-10-CM | POA: Diagnosis not present

## 2024-02-27 DIAGNOSIS — E222 Syndrome of inappropriate secretion of antidiuretic hormone: Secondary | ICD-10-CM

## 2024-02-27 DIAGNOSIS — E785 Hyperlipidemia, unspecified: Secondary | ICD-10-CM | POA: Diagnosis not present

## 2024-02-27 DIAGNOSIS — Z79899 Other long term (current) drug therapy: Secondary | ICD-10-CM | POA: Diagnosis not present

## 2024-02-27 LAB — LIPID PANEL
Cholesterol: 145 mg/dL (ref 0–200)
HDL: 54.1 mg/dL (ref >39.00)
LDL Cholesterol: 74 mg/dL (ref 0–99)
NonHDL: 91.02
Total CHOL/HDL Ratio: 3
Triglycerides: 83 mg/dL (ref 0.0–149.0)
VLDL: 16.6 mg/dL (ref 0.0–40.0)

## 2024-02-27 LAB — PSA: PSA: 1.82 ng/mL (ref 0.10–4.00)

## 2024-02-27 LAB — BASIC METABOLIC PANEL WITH GFR
BUN: 18 mg/dL (ref 6–23)
CO2: 30 meq/L (ref 19–32)
Calcium: 9.4 mg/dL (ref 8.4–10.5)
Chloride: 94 meq/L — ABNORMAL LOW (ref 96–112)
Creatinine, Ser: 0.98 mg/dL (ref 0.40–1.50)
GFR: 75.77 mL/min (ref >60.00)
Glucose, Bld: 103 mg/dL — ABNORMAL HIGH (ref 70–99)
Potassium: 5 meq/L (ref 3.5–5.1)
Sodium: 130 meq/L — ABNORMAL LOW (ref 135–145)

## 2024-02-27 MED ORDER — COVID-19 MRNA VAC-TRIS(PFIZER) 30 MCG/0.3ML IM SUSY
0.3000 mL | PREFILLED_SYRINGE | Freq: Once | INTRAMUSCULAR | 0 refills | Status: AC
  Filled 2024-02-27: qty 0.3, 1d supply, fill #0

## 2024-02-27 NOTE — Patient Instructions (Addendum)
 Vaccines I recommend: Flu shot every fall, COVID booster   For difficulty sleeping. Decrease Ambien to 5 mg daily.  You can take Ambien 10 mg: Half tablet at bedtime. Please read the information about good sleep habits.  Decrease alcohol to no more than 1 serving a day.  Check the  blood pressure regularly Blood pressure goal:  between 110/65 and  135/85. If it is consistently higher or lower, let me know     GO TO THE LAB : Get the blood work     Please go to the front desk: Arrange for a follow-up in 6 months   HEALTHY SLEEP Sleep hygiene: Basic rules for a good night's sleep  Sleep only as much as you need to feel rested and then get out of bed  Keep a regular sleep schedule  Avoid forcing sleep  Exercise regularly for at least 20 minutes, preferably 4 to 5 hours before bedtime  Avoid caffeinated beverages after lunch  Avoid alcohol near bedtime: no "night cap"  Avoid smoking, especially in the evening  Do not go to bed hungry  Adjust bedroom environment  Avoid prolonged use of light-emitting screens before bedtime   Deal with your worries before bedtime

## 2024-02-27 NOTE — Progress Notes (Unsigned)
 Subjective:    Patient ID: Bobby [name], male    DOB: [birth year], 73 y.o.   MRN: [id number]  DOS:  02/27/2024 Type of visit - description: CPX  Here for CPX. Chronic medical problems addressed. In general feels well. Denies chest pain or difficulty breathing.  No edema. Does not seem to have memory issue. Still have some insomnia issues: States that 2 nights ago could not sleep because he was watching the basketball game, yesterday could not follow sleep because he was watching a TV show. Anxiety controlled.  Review of Systems See above   Past Medical History:  Diagnosis Date   Anxiety and depression    Hypertension    Hyponatremia 03/2013   ongoing, seeing Dr. Allena Katz   Insomnia    Melanoma in situ of back [facility])    biopsy on 03/05/2020   SIADH (syndrome of inappropriate ADH production) (HCC) 2017   Dr. Allena Katz   Squamous cell carcinoma in situ     Past Surgical History:  Procedure Laterality Date   BRONCHIAL BIOPSY  11/27/2022   Procedure: BRONCHIAL BIOPSIES;  Surgeon: Leslye Peer, MD;  Location: [facility] ENDOSCOPY;  Service: Cardiopulmonary;;   BRONCHIAL BRUSHINGS  11/27/2022   Procedure: BRONCHIAL BRUSHINGS;  Surgeon: Leslye Peer, MD;  Location: [facility] ENDOSCOPY;  Service: Cardiopulmonary;;   BRONCHIAL WASHINGS  11/27/2022   Procedure: BRONCHIAL WASHINGS;  Surgeon: Leslye Peer, MD;  Location: [name];  Service: Cardiopulmonary;;   MOHS SURGERY  ~ 04/2020   face, [facility]   SHOULDER SURGERY  1980s   right shoulder   TRANSFORAMINAL LUMBAR INTERBODY FUSION (TLIF) WITH PEDICLE SCREW FIXATION 1 LEVEL Left 05/17/2023   Procedure: LEFT-SIDED LUMBAR 4- LUMBAR 5 TRANSFORAMINAL LUMBAR INTERBODY FUSION AND DECOMPRESSION WITH INSTRUMENTATION AND ALLOGRAFT;  Surgeon: Estill Bamberg, MD;  Location: [facility] OR;  Service: Orthopedics;  Laterality: Left;   VIDEO BRONCHOSCOPY N/A 11/27/2022   Procedure: VIDEO BRONCHOSCOPY WITH FLUORO;  Surgeon: Leslye Peer, MD;  Location: [facility] ENDOSCOPY;   Service: Cardiopulmonary;  Laterality: N/A;    Current Outpatient Medications  Medication Instructions   atorvastatin (LIPITOR) 20 mg, Oral, Daily at bedtime   b complex vitamins capsule 1 capsule, Daily   carvedilol (COREG) 6.25 mg, Oral, 2 times daily with meals   cholecalciferol (VITAMIN D3) 1,000 Units, Daily   clonazePAM (KLONOPIN) 0.5 MG tablet TAKE ONE TABLET BY MOUTH TWICE A DAY AS NEEDED FOR ANXIETY   DUPIXENT 300 MG/2ML SOPN Every 14 days   fluticasone (FLONASE) 50 MCG/ACT nasal spray 2 sprays, Each Nare, Daily   folic acid (FOLVITE) 1 mg, Oral, Daily   Multiple Vitamin (MULTIVITAMIN WITH MINERALS) TABS 1 tablet, Oral, Daily   tadalafil (CIALIS) 10 MG tablet TAKE ONE TO TWO TABLETS BY MOUTH EVERY OTHER DAY AS NEEDED   zolpidem (AMBIEN) 10 mg, Oral, At bedtime PRN, for sleep       Objective:   Physical Exam BP 132/82   Pulse 61   Temp 98.3 F (36.8 C) (Oral)   Resp 16   Ht 6' (1.829 m)   Wt 185 lb 8 oz (84.1 kg)   SpO2 98%   BMI 25.16 kg/m  General: Well developed, NAD, BMI noted Neck: No  thyromegaly  HEENT:  Normocephalic . Face symmetric, atraumatic Lungs:  CTA B Normal respiratory effort, no intercostal retractions, no accessory muscle use. Heart: RRR,  no murmur.  Abdomen:  Not distended, soft, non-tender. No rebound or rigidity.   Lower extremities: no pretibial edema bilaterally  Skin: Exposed areas without rash. Not pale. Not jaundice Neurologic:  alert & oriented X3.  Speech normal, gait unassisted, mildly limping noted  Psych: Cognition and judgment appear intact.  Cooperative with normal attention span and concentration.  Behavior appropriate. No anxious or depressed appearing.     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 MCI per neuro visit 12/2017 Hyponatremia onset 2014,  admitted. Dx SIADH (renal 12/2015:RX to watch fluid intake and sx -gait d/o, MS changes) H/o vit D def Oncology: - SCC: DX 07-2019 -Melanoma dx 02/2020 (R upper back);  ~12-2021nose-Moh's (per pt)  -CLL Dx 2023  Abnormal CT chest, bronchoscopy 11-2022, BX negative for malignant .   Next CT May 2024 Dermatology- on Dupixent   PLAN:  Here for CPX   -Tdap   09/2019 - zostavax 2014, s/p Shingrix x2; s/p RSV -  PNM 23: 8 (age ~31),  Prevnar: 2016.  PNM 20 today. -Vaccines I recommend: Flu shot every fall, COVID booster   -CCS: Dr. Ewing Schlein Cscope ~ 2007,Cscope 08-2009; Tubular adenoma per bx report; colonoscopy 09-2015, no polyps. Cscope 05-17-21 -- 5 years per path report - prostate ca screening: No FH, no symptoms.  Check PSA -Diet and exercise: Discussed. - Available labs reviewed: BMP, FLP, PSA  - EtOH: Reports he has 2-3 servings at night. - ACP on file, see media.  HTN: BP looks good, continue carvedilol. Anxiety, depression, insomnia: On clonazepam either half or 1 tablet twice daily as needed. On Ambien 10 mg, due to age recommend to decrease to 5 mg. Also, he apparently watch a lot of TV late at 33 which is likely causing some of the insomnia.  Recommend good sleep habits. EtOH: Encouraged moderation.  Currently "2-3 servings" daily. MCI?  Reports no issues.   MSK: Had L4-5 fusion, 05/17/2023, Dr. Yevette Edwards  Hyponatremia: LOV nephrology 09/19/2023, was recommended fluid and salt restriction as etiology is felt to be SIADH.  Avoid NSAIDs.    CLL: LOV hematology 12/12/2023  Abnormal CT chest: LOV pulmonary 12/19/2023.  Next CT November 2025  RTC 6 months ==== HTN: BP today looks good, on carvedilol. Anxiety, depression, insomnia: On Ambien, clonazepam.  Well-controlled.  UDS and contract today. CV RF: Calculated risk is 22%, high, in addition, CT chest done few months ago show aortic atherosclerosis and coronary artery calcifications.  With this in mind he qualify for statins   despite relatively low LDL. This was extensively discussed with the patient and his wife.  Multiple question answered to the best of my ability.  Plan is to check FLP today and further advise with results.  Offered  a referral to the lipid clinic if so desired. CLL: Dx last year, per oncology Abnormal CT chest: Follow-up by a bronchoscopy which showed no malignancy. RTC 1 year  In addition to CPX,   we had extensive discussion w/ pt and his wife about the need for a statin given his calculated risk and CT chest results.  Multiple questions answered to the best of my ability.

## 2024-02-28 ENCOUNTER — Encounter: Payer: Self-pay | Admitting: Internal Medicine

## 2024-02-28 NOTE — Assessment & Plan Note (Signed)
 Here for CPX  We also discussed the following: HTN: BP looks good, continue carvedilol. Anxiety, depression, insomnia:  On clonazepam either half or 1 tablet twice daily as needed. On Ambien 10 mg, due to age recommend to decrease to 5 mg. Also, he apparently watch a lot of TV late at night which is likely contributing to  insomnia.  Recommend good sleep habits. EtOH: Encouraged moderation.  Currently "2-3 servings" daily. MCI?  Reports no issues. MSK: Had L4-5 fusion, 05/17/2023, Dr. Yevette Edwards Hyponatremia: LOV nephrology 09/19/2023, was recommended fluid and salt restriction as etiology is felt to be SIADH.  Avoid NSAIDs. CLL: LOV hematology 12/12/2023 Abnormal CT chest: LOV pulmonary 12/19/2023.  Next CT November 2025 RTC 6 months

## 2024-02-28 NOTE — Assessment & Plan Note (Signed)
 Here for CPX -Tdap   09/2019 - zostavax 2014, s/p Shingrix x2; s/p RSV -  PNM 23: 66 (age ~16),  Prevnar: 2016.  PNM 20 today. -Vaccines I recommend: Flu shot every fall, COVID booster   -CCS: Dr. Ewing Schlein Cscope ~ 2007,Cscope 08-2009; Tubular adenoma per bx report; colonoscopy 09-2015, no polyps. Cscope 05-17-21 -- 5 years per path report - prostate ca screening: No FH, no symptoms.  Check PSA -Diet and exercise: Discussed. - Available labs reviewed, will get a BMP, FLP, PSA - ACP on file, see media.

## 2024-02-29 ENCOUNTER — Encounter: Payer: Self-pay | Admitting: Internal Medicine

## 2024-03-01 LAB — DRUG MONITORING PANEL 375977 , URINE
Alcohol Metabolites: POSITIVE ng/mL — AB (ref <500)
Amphetamines: NEGATIVE ng/mL (ref <500)
Barbiturates: NEGATIVE ng/mL (ref <300)
Benzodiazepines: NEGATIVE ng/mL (ref <100)
Cocaine Metabolite: NEGATIVE ng/mL (ref <150)
Desmethyltramadol: NEGATIVE ng/mL (ref <100)
Ethyl Glucuronide (ETG): 34613 ng/mL — ABNORMAL HIGH (ref <500)
Ethyl Sulfate (ETS): 8152 ng/mL — ABNORMAL HIGH (ref <100)
Marijuana Metabolite: NEGATIVE ng/mL (ref <20)
Opiates: NEGATIVE ng/mL (ref <100)
Oxycodone: NEGATIVE ng/mL (ref <100)
Tramadol: NEGATIVE ng/mL (ref <100)

## 2024-03-01 LAB — DM TEMPLATE

## 2024-03-03 ENCOUNTER — Other Ambulatory Visit (HOSPITAL_BASED_OUTPATIENT_CLINIC_OR_DEPARTMENT_OTHER): Payer: Self-pay

## 2024-03-20 ENCOUNTER — Other Ambulatory Visit: Payer: Self-pay | Admitting: Internal Medicine

## 2024-04-09 ENCOUNTER — Inpatient Hospital Stay: Payer: Medicare HMO | Attending: Hematology & Oncology

## 2024-04-09 ENCOUNTER — Encounter: Payer: Self-pay | Admitting: Hematology & Oncology

## 2024-04-09 ENCOUNTER — Inpatient Hospital Stay: Payer: Medicare HMO | Admitting: Hematology & Oncology

## 2024-04-09 VITALS — BP 142/71 | HR 51 | Temp 97.7°F | Resp 20 | Ht 72.0 in | Wt 185.8 lb

## 2024-04-09 DIAGNOSIS — R269 Unspecified abnormalities of gait and mobility: Secondary | ICD-10-CM

## 2024-04-09 DIAGNOSIS — Z79899 Other long term (current) drug therapy: Secondary | ICD-10-CM | POA: Insufficient documentation

## 2024-04-09 DIAGNOSIS — C9110 Chronic lymphocytic leukemia of B-cell type not having achieved remission: Secondary | ICD-10-CM

## 2024-04-09 DIAGNOSIS — D72820 Lymphocytosis (symptomatic): Secondary | ICD-10-CM | POA: Diagnosis not present

## 2024-04-09 LAB — CBC WITH DIFFERENTIAL (CANCER CENTER ONLY)
Abs Immature Granulocytes: 0.03 10*3/uL (ref 0.00–0.07)
Basophils Absolute: 0.1 10*3/uL (ref 0.0–0.1)
Basophils Relative: 0 %
Eosinophils Absolute: 0.2 10*3/uL (ref 0.0–0.5)
Eosinophils Relative: 1 %
HCT: 37.8 % — ABNORMAL LOW (ref 39.0–52.0)
Hemoglobin: 13.3 g/dL (ref 13.0–17.0)
Immature Granulocytes: 0 %
Lymphocytes Relative: 76 %
Lymphs Abs: 14.3 10*3/uL — ABNORMAL HIGH (ref 0.7–4.0)
MCH: 32.6 pg (ref 26.0–34.0)
MCHC: 35.2 g/dL (ref 30.0–36.0)
MCV: 92.6 fL (ref 80.0–100.0)
Monocytes Absolute: 0.7 10*3/uL (ref 0.1–1.0)
Monocytes Relative: 4 %
Neutro Abs: 3.7 10*3/uL (ref 1.7–7.7)
Neutrophils Relative %: 19 %
Platelet Count: 192 10*3/uL (ref 150–400)
RBC: 4.08 MIL/uL — ABNORMAL LOW (ref 4.22–5.81)
RDW: 12.6 % (ref 11.5–15.5)
Smear Review: NORMAL
WBC Count: 19 10*3/uL — ABNORMAL HIGH (ref 4.0–10.5)
nRBC: 0 % (ref 0.0–0.2)

## 2024-04-09 LAB — CMP (CANCER CENTER ONLY)
ALT: 19 U/L (ref 0–44)
AST: 26 U/L (ref 15–41)
Albumin: 5 g/dL (ref 3.5–5.0)
Alkaline Phosphatase: 58 U/L (ref 38–126)
Anion gap: 7 (ref 5–15)
BUN: 14 mg/dL (ref 8–23)
CO2: 29 mmol/L (ref 22–32)
Calcium: 9.5 mg/dL (ref 8.9–10.3)
Chloride: 95 mmol/L — ABNORMAL LOW (ref 98–111)
Creatinine: 1 mg/dL (ref 0.61–1.24)
GFR, Estimated: >60 mL/min (ref >60)
Glucose, Bld: 104 mg/dL — ABNORMAL HIGH (ref 70–99)
Potassium: 4.9 mmol/L (ref 3.5–5.1)
Sodium: 131 mmol/L — ABNORMAL LOW (ref 135–145)
Total Bilirubin: 0.9 mg/dL (ref 0.0–1.2)
Total Protein: 6.8 g/dL (ref 6.5–8.1)

## 2024-04-09 LAB — LACTATE DEHYDROGENASE: LDH: 143 U/L (ref 98–192)

## 2024-04-09 LAB — SAVE SMEAR(SSMR), FOR PROVIDER SLIDE REVIEW

## 2024-04-09 NOTE — Progress Notes (Signed)
 Hematology and Oncology Follow Up Visit  Bobby [name] [id number] 12-20-1950 73 y.o. 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  We last saw him back in January.  Since then, he has been doing okay.  He has had some issues with walking, according to his wife.  He shuffles quite a bit.  This is was not improved by the back surgery.  As such, he may have some other neurological issue going on.  Will see about making a referral to Neurology.  Again his back is doing better.  He does not have any problems with pain.  He is still working I think part-time.  He has had no infections.  He has had no cough or shortness of breath.  Has had no change in bowel or bladder habits.  He has had no bleeding.  There is been no leg swelling.  He has not noted any swollen lymph nodes.  Overall, I would say that his performance status is probably ECOG 1.    Wt Readings from Last 3 Encounters:  04/09/24 185 lb 12.8 oz (84.3 kg)  02/27/24 185 lb 8 oz (84.1 kg)  12/31/23 187 lb 2 oz (84.9 kg)     Medications:  Current Outpatient Medications:    atorvastatin  (LIPITOR) 20 MG tablet, Take 1 tablet (20 mg total) by mouth at bedtime., Disp: 90 tablet, Rfl: 1   b complex vitamins capsule, Take 1 capsule by mouth daily., Disp: , Rfl:    carvedilol  (COREG ) 6.25 MG tablet, TAKE 1 TABLET BY MOUTH TWICE A DAY WITH A MEAL, Disp: 180 tablet, Rfl: 3   cholecalciferol  (VITAMIN D3) 25 MCG (1000 UNIT) tablet, Take 1,000 Units by mouth daily., Disp: , Rfl:    clonazePAM  (KLONOPIN ) 0.5 MG tablet, TAKE ONE TABLET BY MOUTH TWICE A DAY AS NEEDED FOR ANXIETY, Disp: 60 tablet, Rfl: 2   folic acid  (FOLVITE ) 1 MG tablet, Take 1 tablet (1 mg total) by mouth daily., Disp: 90 tablet, Rfl: 2   Melatonin 10 MG CAPS, Take 10 mg by mouth at bedtime., Disp: , Rfl:    Multiple Vitamin  (MULTIVITAMIN WITH MINERALS) TABS, Take 1 tablet by mouth daily., Disp: , Rfl:    Pseudoeph-Doxylamine-DM-APAP (NYQUIL PO), Take by mouth. 30 mls q hs., Disp: , Rfl:    tadalafil  (CIALIS ) 10 MG tablet, TAKE ONE TO TWO TABLETS BY MOUTH EVERY OTHER DAY AS NEEDED, Disp: 20 tablet, Rfl: 3   zolpidem  (AMBIEN ) 5 MG tablet, Take 1 tablet (5 mg total) by mouth at bedtime as needed for sleep., Disp: , Rfl:   Allergies:  Allergies  Allergen Reactions   Tramadol Hives    Past Medical History, Surgical history, Social history, and Family History were reviewed and updated.  Review of Systems: Review of Systems  Constitutional: Negative.   HENT:  Negative.    Eyes: Negative.   Respiratory: Negative.    Cardiovascular: Negative.   Gastrointestinal: Negative.   Endocrine: Negative.   Genitourinary: Negative.    Musculoskeletal:  Positive for back pain.  Skin: Negative.   Neurological: Negative.   Hematological: Negative.   Psychiatric/Behavioral: Negative.      Physical Exam:  height is 6' (1.829 m) and weight is 185 lb 12.8 oz (84.3 kg). His oral temperature is 97.7 F (36.5 C). His blood pressure is 142/71 (abnormal) and his pulse is 51 (abnormal).  His respiration is 20 and oxygen saturation is 100%.   Wt Readings from Last 3 Encounters:  04/09/24 185 lb 12.8 oz (84.3 kg)  02/27/24 185 lb 8 oz (84.1 kg)  12/31/23 187 lb 2 oz (84.9 kg)    Physical Exam Vitals reviewed.  HENT:     Head: Normocephalic and atraumatic.  Eyes:     Pupils: Pupils are equal, round, and reactive to light.  Cardiovascular:     Rate and Rhythm: Normal rate and regular rhythm.     Heart sounds: Normal heart sounds.  Pulmonary:     Effort: Pulmonary effort is normal.     Breath sounds: Normal breath sounds.  Abdominal:     General: Bowel sounds are normal.     Palpations: Abdomen is soft.  Musculoskeletal:        General: No tenderness or deformity. Normal range of motion.     Cervical back: Normal  range of motion.     Comments: On the back, he has a healing lumbar laminectomy scar.  Lymphadenopathy:     Cervical: No cervical adenopathy.  Skin:    General: Skin is warm and dry.     Findings: No erythema or rash.  Neurological:     Mental Status: He is alert and oriented to person, place, and time.  Psychiatric:        Behavior: Behavior normal.        Thought Content: Thought content normal.        Judgment: Judgment normal.     Lab Results  Component Value Date   WBC 19.0 (H) 04/09/2024   HGB 13.3 04/09/2024   HCT 37.8 (L) 04/09/2024   MCV 92.6 04/09/2024   PLT 192 04/09/2024     Chemistry      Component Value Date/Time   NA 131 (L) 04/09/2024 1328   NA 133 (A) 09/19/2023 0000   K 4.9 04/09/2024 1328   CL 95 (L) 04/09/2024 1328   CO2 29 04/09/2024 1328   BUN 14 04/09/2024 1328   BUN 16 09/19/2023 0000   CREATININE 1.00 04/09/2024 1328   GLU 110 09/19/2023 0000      Component Value Date/Time   CALCIUM  9.5 04/09/2024 1328   ALKPHOS 58 04/09/2024 1328   AST 26 04/09/2024 1328   ALT 19 04/09/2024 1328   BILITOT 0.9 04/09/2024 1328       Impression and Plan: Bobby [name] is a very nice 73 year old white male.  He has a monoclonal B-cell population of lymphocytes.  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  His blood counts really have not changed in 4 or 5 months.  I am happy to see this.  I do think his other issues are not oncologic.  Again we will see about making referral to Neurology.  I will plan to see him back in the Fall.    Ivor Mars, MD 5/21/20252:11 PM

## 2024-04-09 NOTE — Progress Notes (Signed)
 BP remains elevated, 142/71. Instructed to monitor at home and notify PCP if it remains over 140/90, verbalized understanding.

## 2024-04-10 LAB — KAPPA/LAMBDA LIGHT CHAINS
Kappa free light chain: 13.6 mg/L (ref 3.3–19.4)
Kappa, lambda light chain ratio: 1.66 — ABNORMAL HIGH (ref 0.26–1.65)
Lambda free light chains: 8.2 mg/L (ref 5.7–26.3)

## 2024-04-10 LAB — IGG, IGA, IGM
IgA: 83 mg/dL (ref 61–437)
IgG (Immunoglobin G), Serum: 885 mg/dL (ref 603–1613)
IgM (Immunoglobulin M), Srm: 57 mg/dL (ref 15–143)

## 2024-04-15 ENCOUNTER — Encounter: Payer: Self-pay | Admitting: Neurology

## 2024-05-14 ENCOUNTER — Other Ambulatory Visit: Payer: Self-pay | Admitting: Internal Medicine

## 2024-05-16 ENCOUNTER — Encounter: Payer: Self-pay | Admitting: Internal Medicine

## 2024-05-28 ENCOUNTER — Telehealth: Payer: Self-pay | Admitting: Internal Medicine

## 2024-05-28 NOTE — Telephone Encounter (Signed)
 PDMP okay, Rx sent

## 2024-05-28 NOTE — Telephone Encounter (Signed)
 Requesting: clonazepam  0.5mg   Contract: 02/27/23 UDS: 02/27/24 Last Visit: 02/27/24 Next Visit: 08/27/24 Last Refill: 02/04/24 #60 and 2RF   Please Advise

## 2024-06-10 ENCOUNTER — Encounter: Payer: Self-pay | Admitting: Neurology

## 2024-06-10 ENCOUNTER — Other Ambulatory Visit

## 2024-06-10 ENCOUNTER — Ambulatory Visit: Admitting: Neurology

## 2024-06-10 VITALS — BP 144/85 | HR 63 | Ht 72.0 in | Wt 182.0 lb

## 2024-06-10 DIAGNOSIS — R2681 Unsteadiness on feet: Secondary | ICD-10-CM | POA: Diagnosis not present

## 2024-06-10 DIAGNOSIS — R4189 Other symptoms and signs involving cognitive functions and awareness: Secondary | ICD-10-CM | POA: Diagnosis not present

## 2024-06-10 DIAGNOSIS — Z9889 Other specified postprocedural states: Secondary | ICD-10-CM | POA: Diagnosis not present

## 2024-06-10 NOTE — Patient Instructions (Signed)
 Check labs Nerve testing of the legs MRI brain  Neuropsychological testing Physical therapy referral for balance training  ELECTROMYOGRAM AND NERVE CONDUCTION STUDIES (EMG/NCS) INSTRUCTIONS  How to Prepare The neurologist conducting the EMG will need to know if you have certain medical conditions. Tell the neurologist and other EMG lab personnel if you: Have a pacemaker or any other electrical medical device Take blood-thinning medications Have hemophilia, a blood-clotting disorder that causes prolonged bleeding Bathing Take a shower or bath shortly before your exam in order to remove oils from your skin. Don't apply lotions or creams before the exam.  What to Expect You'll likely be asked to change into a hospital gown for the procedure and lie down on an examination table. The following explanations can help you understand what will happen during the exam.  Electrodes. The neurologist or a technician places surface electrodes at various locations on your skin depending on where you're experiencing symptoms. Or the neurologist may insert needle electrodes at different sites depending on your symptoms.  Sensations. The electrodes will at times transmit a tiny electrical current that you may feel as a twinge or spasm. The needle electrode may cause discomfort or pain that usually ends shortly after the needle is removed. If you are concerned about discomfort or pain, you may want to talk to the neurologist about taking a short break during the exam.  Instructions. During the needle EMG, the neurologist will assess whether there is any spontaneous electrical activity when the muscle is at rest - activity that isn't present in healthy muscle tissue - and the degree of activity when you slightly contract the muscle.  He or she will give you instructions on resting and contracting a muscle at appropriate times. Depending on what muscles and nerves the neurologist is examining, he or she may ask you  to change positions during the exam.  After your EMG You may experience some temporary, minor bruising where the needle electrode was inserted into your muscle. This bruising should fade within several days. If it persists, contact your primary care doctor.   You have been referred for a neurocognitive evaluation (i.e., evaluation of memory and thinking abilities). Please bring someone with you to this appointment if possible, as it is helpful for the neuropsychologist to hear from both you and another adult who knows you well. Please bring eyeglasses and hearing aids if you wear them and take any medications as you normally would. Please fully abstain from all alcohol, marijuana, or other substances prior to your appointment.   The evaluation will take approximately 2-3 hours and has two parts:   The first part is a clinical interview with the neuropsychologist, Dr. Richie or Dr. Gayland. During the interview, the neuropsychologist will speak with you and the individual you brought to the appointment.    The second part of the evaluation is testing with the doctor's technician, aka psychometrician, Dana or [employer]. During the testing, the technician will ask you to remember different types of material, solve problems, and answer some questionnaires. Your family member will not be present for this portion of the evaluation.   Please note: We have to reserve several hours of the neuropsychologist's time and the psychometrician's time for your evaluation appointment. As such, there is a No-Show fee of $100. If you are unable to attend any of your appointments, please contact our office as soon as possible to reschedule.

## 2024-06-10 NOTE — Progress Notes (Signed)
 [facility] Neurology Division Clinic Note - Initial Visit   Date: 06/10/2024   Bobby [name] MRN: [id number] DOB: May 28, 1951   Dear Dr. Timmy:  Thank you for your kind referral of Bobby [name] for consultation of gait imbalance. Although his history is well known to you, please allow us  to reiterate it for the purpose of our medical record. The patient was accompanied to the clinic by wife who also provides collateral information.     Bobby [name] is a 73 y.o. right-handed male with B-cell lymphocytosis, hyperlipidemia, hyponatremia, s/p lumbar decompression at L4-L5 presenting for evaluation of gait imbalance.   IMPRESSION/PLAN: Unsteady gait, most likely related to residual deficits from severity of lumbar canal stenosis s/p decompression (June 2024). His gait and exam is not typical for parkinson's disease.    - Check vitamin B12, folate, vitamin B1  - NCS/EMG of the legs  - PT referral for gait training  2.   Cognitive changes.  He scored 21/30 in [facility] today with global deficits. Further testing to evaluate these symptoms include:  - MRI brain wo contrast (this will also check for NPH given his gait change)  - Neuropsychological testing  Return to clinic after testing  ------------------------------------------------------------- History of present illness: He has history of back surgery in June 2024 by Dr. Beuford and completed PT before and after this.  He reports that balance is better when he does PT, but then gets worse again.  He has not been compliant with staying active or doing home exercises.  He has fallen 4 times, which mostly seem mechanical.  His wife says that he tends to take much smaller steps and can hear his feet catching the floor at home.   She has also noticed that his more forgetful which is very atypical for him.  On one occasion, he asked her what dumbells are.  He is able to manage IADLs and ADLs.  He does not feel that  there is any memory issues.    He own an office building.  Nonsmoker.  He drinks several alcoholic beverages daily for many years.    Out-side paper records, electronic medical record, and images have been reviewed where available and summarized as:  MRI lumbar spine 02/10/2023: 1. At L4-5 there is a broad-based disc bulge. Severe bilateral facet arthropathy and ligamentum flavum infolding. Severe spinal stenosis. No left foraminal stenosis. Severe right foraminal stenosis. 2. At L5-S1 there is a broad-based disc osteophyte complex. Moderate bilateral facet arthropathy with a small right facet effusion. Severe left foraminal stenosis. Mild right foraminal stenosis. 3. At L3-4 there is a broad-based disc bulge. Moderate bilateral facet arthropathy. Mild spinal stenosis. Moderate right and mild left foraminal stenosis. 4. No acute osseous injury of the lumbar spine.  MRI cervical spine 02/10/2023: 1. At T1-2 there is a minimal broad-based disc osteophyte complex. Moderate-severe bilateral foraminal stenosis. 2. No acute osseous injury of the thoracic spine. 3. Partially visualized cervical spine spondylosis. If there is further clinical concern, recommend a dedicated MRI of the cervical spine.  No results found for: HGBA1C Lab Results  Component Value Date   VITAMINB12 508 12/26/2017   Lab Results  Component Value Date   TSH 1.36 02/21/2023   No results found for: ESRSEDRATE, POCTSEDRATE  Past Medical History:  Diagnosis Date   Anxiety and depression    Hypertension    Hyponatremia 03/2013   ongoing, seeing Dr. 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 Tobie   Squamous cell carcinoma in situ     Past Surgical History:  Procedure Laterality Date   BRONCHIAL BIOPSY  11/27/2022   Procedure: BRONCHIAL BIOPSIES;  Surgeon: Shelah Lamar RAMAN, MD;  Location: [facility] ENDOSCOPY;   Service: Cardiopulmonary;;   BRONCHIAL BRUSHINGS  11/27/2022   Procedure: BRONCHIAL BRUSHINGS;  Surgeon: Shelah Lamar RAMAN, MD;  Location: [facility] ENDOSCOPY;  Service: Cardiopulmonary;;   BRONCHIAL WASHINGS  11/27/2022   Procedure: BRONCHIAL WASHINGS;  Surgeon: Shelah Lamar RAMAN, MD;  Location: [name];  Service: Cardiopulmonary;;   MOHS SURGERY  ~ 2020/06/10   face, [facility]   SHOULDER SURGERY  1980s   right shoulder   TRANSFORAMINAL LUMBAR INTERBODY FUSION (TLIF) WITH PEDICLE SCREW FIXATION 1 LEVEL Left 05/17/2023   Procedure: LEFT-SIDED LUMBAR 4- LUMBAR 5 TRANSFORAMINAL LUMBAR INTERBODY FUSION AND DECOMPRESSION WITH INSTRUMENTATION AND ALLOGRAFT;  Surgeon: Beuford Anes, MD;  Location: [facility] OR;  Service: Orthopedics;  Laterality: Left;   VIDEO BRONCHOSCOPY N/A 11/27/2022   Procedure: VIDEO BRONCHOSCOPY WITH FLUORO;  Surgeon: Shelah Lamar RAMAN, MD;  Location: [facility] ENDOSCOPY;  Service: Cardiopulmonary;  Laterality: N/A;     Medications:  Outpatient Encounter Medications as of 06/10/2024  Medication Sig   b complex vitamins capsule Take 1 capsule by mouth daily.   carvedilol  (COREG ) 6.25 MG tablet TAKE 1 TABLET BY MOUTH TWICE A DAY WITH A MEAL   cholecalciferol  (VITAMIN D3) 25 MCG (1000 UNIT) tablet Take 1,000 Units by mouth daily.   clonazePAM  (KLONOPIN ) 0.5 MG tablet TAKE 1 TABLET BY MOUTH 2 TIMES A DAY AS NEEDED FOR ANXIETY   folic acid  (FOLVITE ) 1 MG tablet Take 1 tablet (1 mg total) by mouth daily.   Melatonin 10 MG CAPS Take 10 mg by mouth at bedtime.   Multiple Vitamin (MULTIVITAMIN WITH MINERALS) TABS Take 1 tablet by mouth daily.   Pseudoeph-Doxylamine-DM-APAP (NYQUIL PO) Take by mouth. 30 mls q hs.   tadalafil  (CIALIS ) 10 MG tablet TAKE ONE TO TWO TABLETS BY MOUTH EVERY OTHER DAY AS NEEDED   zolpidem  (AMBIEN ) 5 MG tablet Take 1 tablet (5 mg total) by mouth at bedtime as needed for sleep.   atorvastatin  (LIPITOR) 20 MG tablet Take 1 tablet (20 mg total) by mouth at bedtime. (Patient not taking: Reported  on 06/10/2024)   No facility-administered encounter medications on file as of 06/10/2024.    Allergies:  Allergies  Allergen Reactions   Tramadol Hives    Family History: Family History  Problem Relation Age of Onset   Hypertension Mother    Stroke Mother    Diabetes Mother    Emphysema Mother        smoked   Thyroid  disease Mother    Alcoholism Father    Lung cancer Maternal Grandfather    Colon cancer Neg Hx    Prostate cancer Neg Hx    CAD Neg Hx     Social History: Social History   Tobacco Use   Smoking status: Never   Smokeless tobacco: Never  Vaping Use   Vaping status: Never Used  Substance Use Topics   Alcohol use: Yes    Comment: 3 qd   - Drinks Some   Drug use: No   Social History   Social History Narrative   [name], married    2 children   mom passed away 2008-06-10  Right-handed.   2-3 cups  coffee per day.   Lives at home with wife.      Are you right handed or left handed? Right Handed   Are you currently employed ? 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 No    Who lives with you? Wife    What type of home do you live in: 1 story or 2 story? Lives in a 3 story home               Vital Signs:  BP (!) 144/85   Pulse 63   Ht 6' (1.829 m)   Wt 182 lb (82.6 kg)   SpO2 98%   BMI 24.68 kg/m   Neurological Exam: MENTAL STATUS including orientation to time, place, person, recent and remote memory, attention span is normal.  He has tangential thought process and tends to confabulate.  Speech is not dysarthric.     06/10/2024   11:44 AM  Montreal Cognitive Assessment   Visuospatial/ Executive (0/5) 3  Naming (0/3) 3  Attention: Read list of digits (0/2) 2  Attention: Read list of letters (0/1) 1  Attention: Serial 7 subtraction starting at 100 (0/3) 1  Language: Repeat phrase (0/2) 2  Language : Fluency (0/1) 0  Abstraction (0/2) 2  Delayed Recall (0/5) 1  Orientation (0/6) 6  Total 21   Adjusted Score (based on education) 21   CRANIAL NERVES: II:  No visual field defects.     III-IV-VI: Pupils equal round and reactive to light.  Normal conjugate, extra-ocular eye movements in all directions of gaze.  No nystagmus.  No ptosis.   V:  Normal facial sensation.    VII:  Normal facial symmetry and movements.   VIII:  Normal hearing and vestibular function.   IX-X:  Normal palatal movement.   XI:  Normal shoulder shrug and head rotation.   XII:  Normal tongue strength and range of motion, no deviation or fasciculation.  MOTOR:  No atrophy, fasciculations or abnormal movements.  No pronator drift.   Upper Extremity:  Right  Left  Deltoid  5/5   5/5   Biceps  5/5   5/5   Triceps  5/5   5/5   Wrist extensors  5/5   5/5   Wrist flexors  5/5   5/5   Finger extensors  5/5   5/5   Finger flexors  5/5   5/5   Dorsal interossei  5/5   5/5   Abductor pollicis  5/5   5/5   Tone (Ashworth scale)  0  0   Lower Extremity:  Right  Left  Hip flexors  5/5   5/5   Knee flexors  5/5   5/5   Knee extensors  5/5   5/5   Dorsiflexors  5/5   5/5   Plantarflexors  5/5   5/5   Toe extensors  5/5   5/5   Toe flexors  5/5   5/5   Tone (Ashworth scale)  0  0   MSRs:                                           Right        Left brachioradialis 2+  2+  biceps 2+  2+  triceps 2+  2+  patellar 3+  3+  ankle jerk 2+  2+  Hoffman no  no  plantar response down  down   SENSORY:  Normal and symmetric perception of light touch, pinprick, vibration, and temperature.  Romberg's sign absent.   COORDINATION/GAIT: Normal finger-to- nose-finger.  Intact rapid alternating movements bilaterally.  Gait is wide-based, smaller steps, unassisted.  Stressed gait intact.  He has some unsteadiness with tandem gait.   Total time spent reviewing records, interview, history/exam, documentation, and coordination of care on day of encounter:  65 minutes    Thank you for allowing me to participate in patient's  care.  If I can answer any additional questions, I would be pleased to do so.    Sincerely,    Anae Hams K. Tobie, DO

## 2024-06-12 NOTE — Progress Notes (Signed)
 Approved. CPT 29448 Authorization Number: [id number] Case Number: [id number]     Patient Name: Bobby [name], Bobby [name] DOB: 1951-01-22 Status: Approved  P2P Status:  Referring Provider: PATEL, DONIKA Referring Provider NPI: [id number] Approval Date: 06/12/2024 3:42:08 PM Service Code: 29448 Service Description: MRI Brain W/O CONTRAST Site Name: [facility] [place] [facility]  Site NPI: [id number] Site Address: [address]  Site City: [place] Site State: [place] Site Zip: [id number] Start Date: 06/12/2024 Expiration Date: 12/09/2024 Date Last Updated: 06/12/2024 3:42:45 PM Correspondence:

## 2024-06-14 LAB — B12 AND FOLATE PANEL
Folate: >24 ng/mL
Vitamin B-12: 576 pg/mL (ref 200–1100)

## 2024-06-14 LAB — VITAMIN B1: Vitamin B1 (Thiamine): 71 nmol/L — ABNORMAL HIGH (ref 8–30)

## 2024-06-16 ENCOUNTER — Ambulatory Visit: Payer: Self-pay | Admitting: Neurology

## 2024-06-18 DIAGNOSIS — H02831 Dermatochalasis of right upper eyelid: Secondary | ICD-10-CM | POA: Diagnosis not present

## 2024-06-18 DIAGNOSIS — Z85828 Personal history of other malignant neoplasm of skin: Secondary | ICD-10-CM | POA: Diagnosis not present

## 2024-06-18 DIAGNOSIS — Z08 Encounter for follow-up examination after completed treatment for malignant neoplasm: Secondary | ICD-10-CM | POA: Diagnosis not present

## 2024-06-18 DIAGNOSIS — L570 Actinic keratosis: Secondary | ICD-10-CM | POA: Diagnosis not present

## 2024-06-18 DIAGNOSIS — L739 Follicular disorder, unspecified: Secondary | ICD-10-CM | POA: Diagnosis not present

## 2024-06-18 DIAGNOSIS — H02834 Dermatochalasis of left upper eyelid: Secondary | ICD-10-CM | POA: Diagnosis not present

## 2024-06-18 DIAGNOSIS — L821 Other seborrheic keratosis: Secondary | ICD-10-CM | POA: Diagnosis not present

## 2024-06-18 DIAGNOSIS — Z85820 Personal history of malignant melanoma of skin: Secondary | ICD-10-CM | POA: Diagnosis not present

## 2024-06-18 DIAGNOSIS — L578 Other skin changes due to chronic exposure to nonionizing radiation: Secondary | ICD-10-CM | POA: Diagnosis not present

## 2024-06-18 DIAGNOSIS — L814 Other melanin hyperpigmentation: Secondary | ICD-10-CM | POA: Diagnosis not present

## 2024-06-18 DIAGNOSIS — D225 Melanocytic nevi of trunk: Secondary | ICD-10-CM | POA: Diagnosis not present

## 2024-06-21 NOTE — Therapy (Incomplete)
 OUTPATIENT PHYSICAL THERAPY NEURO EVALUATION   Patient Name: Bobby [name] MRN: [id number] DOB:August 18, 1951, 73 y.o., male Today's Date: 06/25/2024   END OF SESSION:  PT End of Session - 06/24/24 1018     Visit Number 1    PT Start Time 1017    PT Stop Time 1105    PT Time Calculation (min) 48 min    Activity Tolerance Patient tolerated treatment well;No increased pain    Behavior During Therapy [facility] for tasks assessed/performed          Past Medical History:  Diagnosis Date   Anxiety and depression    Hypertension    Hyponatremia 03/2013   ongoing, seeing Dr. Tobie   Insomnia    Melanoma in situ of back [facility])    biopsy on 03/05/2020   SIADH (syndrome of inappropriate ADH production) (HCC) 2017   Dr. Tobie   Squamous cell carcinoma in situ    Past Surgical History:  Procedure Laterality Date   BRONCHIAL BIOPSY  11/27/2022   Procedure: BRONCHIAL BIOPSIES;  Surgeon: Shelah Lamar RAMAN, MD;  Location: [facility] ENDOSCOPY;  Service: Cardiopulmonary;;   BRONCHIAL BRUSHINGS  11/27/2022   Procedure: BRONCHIAL BRUSHINGS;  Surgeon: Shelah Lamar RAMAN, MD;  Location: [facility] ENDOSCOPY;  Service: Cardiopulmonary;;   BRONCHIAL WASHINGS  11/27/2022   Procedure: BRONCHIAL WASHINGS;  Surgeon: Shelah Lamar RAMAN, MD;  Location: [name];  Service: Cardiopulmonary;;   MOHS SURGERY  ~ 04/2020   face, [facility]   SHOULDER SURGERY  1980s   right shoulder   TRANSFORAMINAL LUMBAR INTERBODY FUSION (TLIF) WITH PEDICLE SCREW FIXATION 1 LEVEL Left 05/17/2023   Procedure: LEFT-SIDED LUMBAR 4- LUMBAR 5 TRANSFORAMINAL LUMBAR INTERBODY FUSION AND DECOMPRESSION WITH INSTRUMENTATION AND ALLOGRAFT;  Surgeon: Beuford Anes, MD;  Location: [facility] OR;  Service: Orthopedics;  Laterality: Left;   VIDEO BRONCHOSCOPY N/A 11/27/2022   Procedure: VIDEO BRONCHOSCOPY WITH FLUORO;  Surgeon: Shelah Lamar RAMAN, MD;  Location: [facility] ENDOSCOPY;  Service: Cardiopulmonary;  Laterality: N/A;   Patient Active Problem List   Diagnosis Date Noted    Radiculopathy of lumbar region 05/17/2023   CLL (chronic lymphocytic leukemia) (HCC) 02/21/2023   Abnormal CT of the chest 11/01/2022   Melanoma in situ of back (HCC) 03/16/2020   Squamous cell carcinoma in situ 08/21/2019   Vitamin D  deficiency 05/29/2019   Cough 01/01/2016   PCP NOTES >>>> 09/28/2015   Hypogonadism in male 05/13/2015   SIADH (syndrome of inappropriate ADH production) (HCC) 04/13/2013   Memory problem 04/13/2013   Annual physical exam 03/06/2012   Idiopathic scoliosis and kyphoscoliosis 08/18/2010   BACK PAIN, LUMBAR 07/21/2010   Anxiety state 04/01/2009   Essential hypertension, benign 01/04/2009   INSOMNIA-SLEEP DISORDER-UNSPEC 09/08/2008    PCP: Amon Aloysius BRAVO, MD   REFERRING PROVIDER: Tobie Tonita POUR, DO   REFERRING DIAG: R26.81 (ICD-10-CM) - Unsteady gait R41.89 (ICD-10-CM) - Cognitive changes  THERAPY DIAG:  Muscle weakness (generalized) - Plan: PT plan of care cert/re-cert  Other abnormalities of gait and mobility  RATIONALE FOR EVALUATION AND TREATMENT: Rehabilitation  ONSET DATE: prrogressive over last 6 months  NEXT MD VISIT: after brain MRI   SUBJECTIVE:  SUBJECTIVE STATEMENT: 73 y/o male referred to PT from Dr Tobie for unsteady gait and falls.  States he has gotten progressively more unsteady over last 6 months for no apparent reason.  He had therapy here a year ago following a lumbar fusion and did quite well.  He states he really has very little back pain or trouble with his back now.   Balance is his chief c/o.    Neurology notes indicate that they feel his unsteadiness may be residual effects of the back surgery and residual canal stenosis.  They did not feel he has Parkinson's.   However, he is going to be worked up more for his memory deficits and  brain MRI is pending.  Patient reports Having difficulty walking up/down steps carrying things up to the second floor of his home and is generally unsteady going out places.  He has difficulty verbalizing his exact symptoms due to what seems to be cognitive issues  Pt accompanied by: self  PAIN: Are you having pain? Yes: NPRS scale: 0/10 now;  intermittent R lateral thigh pain Pain location: R lateral thigh Pain description: aching Aggravating factors: walking >10 min Relieving factors: standing, not walking  PERTINENT HISTORY:  L4/5 TLIF [place] 6/24; CLL, HTN, memory deficit  PRECAUTIONS: Fall  RED FLAGS: None  WEIGHT BEARING RESTRICTIONS: No  FALLS:  Has patient fallen in last 6 months? Yes. Number of falls 0  LIVING ENVIRONMENT: Lives with: lives with their family and lives with their spouse Lives in: House/apartment Stairs: Yes: Internal: 4 steps; can reach both and External: 1 flight steps; on right going up Has following equipment at home: walking stick when going fishing  OCCUPATION: still rents office space out;  owns a building  PLOF: Independent with gait  PATIENT GOALS: improve my balance    OBJECTIVE: (objective measures completed at initial evaluation unless otherwise dated)  DIAGNOSTIC FINDINGS:  Out-side paper records, electronic medical record, and images have been reviewed where available and summarized as:  MRI lumbar spine 02/10/2023: 1. At L4-5 there is a broad-based disc bulge. Severe bilateral facet arthropathy and ligamentum flavum infolding. Severe spinal stenosis. No left foraminal stenosis. Severe right foraminal stenosis. 2. At L5-S1 there is a broad-based disc osteophyte complex. Moderate bilateral facet arthropathy with a small right facet effusion. Severe left foraminal stenosis. Mild right foraminal stenosis. 3. At L3-4 there is a broad-based disc bulge. Moderate bilateral facet arthropathy. Mild spinal stenosis. Moderate right and  mild left foraminal stenosis. 4. No acute osseous injury of the lumbar spine.   MRI cervical spine 02/10/2023: 1. At T1-2 there is a minimal broad-based disc osteophyte complex. Moderate-severe bilateral foraminal stenosis. 2. No acute osseous injury of the thoracic spine. 3. Partially visualized cervical spine spondylosis. If there is further clinical concern, recommend a dedicated MRI of the cervical spine.  COGNITION: Overall cognitive status: Impaired   SENSATION: WFL  COORDINATION: Finger to nose is slightly impaired  EDEMA:  None noted  MUSCLE TONE: WNL  DTRs:  NT   POSTURE:  decreased lumbar lordosis, tends to slouch  MUSCLE LENGTH: Hamstrings: moderate tightness BLE ITB: slight tight R Piriformis: slight tight BLE Hip flexors: NT  LOWER EXTREMITY ROM:     Active  Right eval Left eval  Hip flexion    Hip extension    Hip abduction    Hip adduction    Hip internal rotation    Hip external rotation    Knee flexion    Knee extension  Ankle dorsiflexion    Ankle plantarflexion    Ankle inversion    Ankle eversion     (Blank rows = not tested)  LOWER EXTREMITY MMT:    MMT Right eval Left eval  Hip flexion    Hip extension    Hip abduction    Hip adduction    Hip internal rotation 4- 4  Hip external rotation 4+ 4  Knee flexion 5 5  Knee extension 5 5  Ankle dorsiflexion 4+ 4  Ankle plantarflexion    Ankle inversion    Ankle eversion    (Blank rows = not tested)  BED MOBILITY:  Independent in all aspects  TRANSFERS: Assistive device utilized: None  Sit to stand: SBA Stand to sit: Complete Independence Chair to chair: SBA Floor: NT  GAIT: Distance walked: 150' in clinic Assistive device utilized: None Level of assistance: CGA Gait pattern: staggering gait, frequent losses of balance but can correct or catch himself Comments:   STAIRS:  Level of Assistance: SBA  Stair Negotiation Technique: Alternating Pattern  with Single  Rail on Right  Number of Stairs: 12    Height of Stairs: 7  Comments: goes quickly to compensate for lack of balance  FUNCTIONAL TESTS:  FGA = 17 5X STS = 9.75 sec TUG = 7 sec Gait speed = 3.11 ft/sec  PATIENT SURVEYS:  ABC = 66.9%   TODAY'S TREATMENT:  06/24/24 SELF CARE: Provided education on PT POC progression and to improve safety with use of SPC for assistive device, recommended to use a cane out of home;  initial HEP    PATIENT EDUCATION:  Education details: PT eval findings, anticipated POC, and initial HEP  Person educated: Patient and Spouse Education method: Explanation, Demonstration, Verbal cues, Tactile cues, and MedBridge[employer] app access provided Education comprehension: verbalized understanding, verbal cues required, tactile cues required, and needs further education  HOME EXERCISE PROGRAM: Access Code: [email or web address] URL: [email or web address] Date: 06/24/2024 Prepared by: Garnette Montclair  Exercises - Toe Raises with Unilateral Counter Support  - 1 x daily - 7 x weekly - 3 sets - 10 reps - Heel Raises with Counter Support  - 1 x daily - 7 x weekly - 3 sets - 10 reps - Mini Squat with Counter Support  - 1 x daily - 7 x weekly - 3 sets - 10 reps - Standing Tandem Balance with Counter Support  - 1 x daily - 7 x weekly - 1 sets - 1 reps - 1 min hold - Single Leg Stance  - 1 x daily - 7 x weekly - 1 sets - 1 reps - 1 min hold   ASSESSMENT:  CLINICAL IMPRESSION: ALDRIDGE KRZYZANOWSKI is a 73 y.o. male who was referred to physical therapy for evaluation and treatment for unsteady gait and falls.  Patient presents with physical impairments of impaired activity tolerance, impaired standing balance, impaired ambulation, and decreased safety awareness impacting safe and independent functional mobility.  He covers for his poor overall balance by ambulating and transferring very quickly.   However, when he is asked to walk and do activities more slowly with better  control, he looses balance much more easily.   Timed tests do not show balance deficits as follows:  Gait speed 3.11 ft/sec, (2.62 ft/sec is needed for community access),  TUG of 7 sec (>13.5 sec indicates increased risk for falls), and 5xSTS of 9.75 sec (>15 sec indicates increased risk for falls and decreased BLE power).  However, his FGA score  is 19/30 indicating a definite fall risk.   ABC scale score of 66.9% indicates a moderate level of physical functioning, but I think he overestimates his abilities and/or has decreased insight into his actual deficits.  HE is advised to use at least a  cane for safety.    Lenwood Balsam will benefit from skilled PT to address above deficits to improve mobility and activity tolerance to help reach the maximal level of functional independence and mobility. Patient demonstrates understanding of this POC and is in agreement with this plan.   OBJECTIVE IMPAIRMENTS: Abnormal gait, difficulty walking, decreased safety awareness, and impaired perceived functional ability.   ACTIVITY LIMITATIONS: carrying, bending, stairs, and locomotion level  PARTICIPATION LIMITATIONS: laundry, shopping, and community activity  PERSONAL FACTORS: Age, Time since onset of injury/illness/exacerbation, and 1-2 comorbidities:   L4/5 TLIF [facility] 6/24; CLL, HTN, memory deficit are also affecting patient's functional outcome.   REHAB POTENTIAL: Good  CLINICAL DECISION MAKING: Evolving/moderate complexity  EVALUATION COMPLEXITY: Moderate   GOALS: Goals reviewed with patient? Yes  SHORT TERM GOALS: Target date: 07/23/2024   Patient will be independent with initial HEP to improve outcomes and carryover.  Baseline: PT assist required Goal status: INITIAL  2.  Patient will be educated on strategies to decrease risk of falls.  Baseline: initial education provided (needs sheet) Goal status: INITIAL  LONG TERM GOALS: Target date: 08/20/2024   Patient will be independent with  advanced/ongoing HEP to facilitate ability to maintain/progress functional gains from skilled physical therapy services. Baseline:no advanced HEP yet Goal status: INITIAL  3.  Patient will be able to step up/down curb safely with LRAD for safety with community ambulation.  Baseline: TBD Goal status: INITIAL   4.  Patient will demonstrate improved BLE strength to >/= 5/5 for improved stability and ease of mobility . Baseline: Refer to above LE MMT table Goal status: INITIAL  7.  Patient will improve Berg score to >/= 50/56 to improve safety and stability with ADLs in standing and reduce risk for falls. (MCID= 8 points)  Baseline: TBD Goal status: INITIAL  9. Patient will improve FGA score to at least  24/30 to improve gait stability and reduce risk for falls. Baseline: 19 Goal status: INITIAL  10.  Patient will report >/= 86% on ABC scale (MCID = 19%) to demonstrate improved balance confidence with functional mobility and gait. Baseline: 66 Goal status: INITIAL   PLAN:  PT FREQUENCY: 1-2x/week  PT DURATION: 8 weeks  PLANNED INTERVENTIONS: 97164- PT Re-evaluation, 97750- Physical Performance Testing, 97110-Therapeutic exercises, 97530- Therapeutic activity, [id number]- Neuromuscular re-education, 97535- Self Care, [id number]- Manual therapy, [id number]- Gait training, [phone]- Electrical stimulation (unattended), 97016- Vasopneumatic device, [id number]- Ultrasound, [id number]- Traction (mechanical), 20560 (1-2 muscles), 20561 (3+ muscles)- Dry Needling, Patient/Family education, Balance training, Stair training, Taping, Joint mobilization, Cryotherapy, and Moist heat  PLAN FOR NEXT SESSION: progress with dynamic balance activities   Merlinda Wrubel, PT 06/25/2024, 5:06 PM

## 2024-06-24 ENCOUNTER — Other Ambulatory Visit: Payer: Self-pay

## 2024-06-24 ENCOUNTER — Encounter: Payer: Self-pay | Admitting: Rehabilitation

## 2024-06-24 ENCOUNTER — Ambulatory Visit: Attending: Neurology | Admitting: Rehabilitation

## 2024-06-24 DIAGNOSIS — M6281 Muscle weakness (generalized): Secondary | ICD-10-CM | POA: Insufficient documentation

## 2024-06-24 DIAGNOSIS — R4189 Other symptoms and signs involving cognitive functions and awareness: Secondary | ICD-10-CM | POA: Insufficient documentation

## 2024-06-24 DIAGNOSIS — M5459 Other low back pain: Secondary | ICD-10-CM | POA: Diagnosis not present

## 2024-06-24 DIAGNOSIS — R2689 Other abnormalities of gait and mobility: Secondary | ICD-10-CM | POA: Diagnosis not present

## 2024-06-24 DIAGNOSIS — R2681 Unsteadiness on feet: Secondary | ICD-10-CM | POA: Insufficient documentation

## 2024-06-25 ENCOUNTER — Ambulatory Visit (HOSPITAL_BASED_OUTPATIENT_CLINIC_OR_DEPARTMENT_OTHER)
Admission: RE | Admit: 2024-06-25 | Discharge: 2024-06-25 | Disposition: A | Discharge status: Home / Self Care | Source: Ambulatory Visit | Attending: Neurology | Admitting: Neurology

## 2024-06-25 DIAGNOSIS — R4189 Other symptoms and signs involving cognitive functions and awareness: Secondary | ICD-10-CM | POA: Insufficient documentation

## 2024-06-25 DIAGNOSIS — R2681 Unsteadiness on feet: Secondary | ICD-10-CM | POA: Insufficient documentation

## 2024-06-25 DIAGNOSIS — R269 Unspecified abnormalities of gait and mobility: Secondary | ICD-10-CM | POA: Diagnosis not present

## 2024-06-26 ENCOUNTER — Telehealth: Payer: Self-pay

## 2024-06-26 NOTE — Telephone Encounter (Signed)
 Spoke w/ Pt- he is requesting PCP to prescribe naltrexone for etoh abuse. Informed that PCP would need to see him to discuss this further. Appt scheduled 07/07/24

## 2024-06-26 NOTE — Telephone Encounter (Signed)
 Needs a comprehensive program, please refer to [facility] or psychiatry. [email or web address]

## 2024-06-26 NOTE — Telephone Encounter (Signed)
 Copied from CRM [id number]. Topic: Clinical - Medication Question >> Jun 26, 2024  9:26 AM Mercedes MATSU wrote: Reason for CRM: Patient called in requesting to speak with Dr. Amon directly or his nurse. He said its in regards to a medication, I asked if it was for a refill or something I could help with and he declined and stated he just wants to speak with them. He would not disclose further. Patient can be reached at [phone].

## 2024-07-02 ENCOUNTER — Ambulatory Visit: Admitting: Rehabilitation

## 2024-07-02 DIAGNOSIS — M5459 Other low back pain: Secondary | ICD-10-CM | POA: Diagnosis not present

## 2024-07-02 DIAGNOSIS — R2681 Unsteadiness on feet: Secondary | ICD-10-CM | POA: Diagnosis not present

## 2024-07-02 DIAGNOSIS — M6281 Muscle weakness (generalized): Secondary | ICD-10-CM

## 2024-07-02 DIAGNOSIS — R2689 Other abnormalities of gait and mobility: Secondary | ICD-10-CM

## 2024-07-02 DIAGNOSIS — R4189 Other symptoms and signs involving cognitive functions and awareness: Secondary | ICD-10-CM | POA: Diagnosis not present

## 2024-07-02 NOTE — Therapy (Signed)
 OUTPATIENT PHYSICAL THERAPY NEURO EVALUATION   Patient Name: Bobby [name] MRN: [id number] DOB:03-09-1951, 73 y.o., male Today's Date: 07/02/2024   END OF SESSION:  PT End of Session - 07/02/24 1023     Visit Number 2    PT Start Time 1019    PT Stop Time 1100    PT Time Calculation (min) 41 min    Activity Tolerance Patient tolerated treatment well;No increased pain    Behavior During Therapy Bobby [name] for tasks assessed/performed          Past Medical History:  Diagnosis Date   Anxiety and depression    Hypertension    Hyponatremia 03/2013   ongoing, seeing Bobby [name]   Insomnia    Melanoma in situ of back [name]    biopsy on 03/05/2020   SIADH (syndrome of inappropriate ADH production) (HCC) 2017   Bobby [name]   Squamous cell carcinoma in situ    Past Surgical History:  Procedure Laterality Date   BRONCHIAL BIOPSY  11/27/2022   Procedure: BRONCHIAL BIOPSIES;  Surgeon: Bobby Lamar RAMAN, MD;  Location: [name];  Service: Cardiopulmonary;;   BRONCHIAL BRUSHINGS  11/27/2022   Procedure: BRONCHIAL BRUSHINGS;  Surgeon: Bobby Lamar RAMAN, MD;  [name];  Service: Cardiopulmonary;;   BRONCHIAL WASHINGS  11/27/2022   Procedure: BRONCHIAL WASHINGS;  Surgeon: Bobby Lamar RAMAN, MD;  Location: [name];  Service: Cardiopulmonary;;   MOHS SURGERY  ~ 04/2020   face, [facility]   SHOULDER SURGERY  1980s   right shoulder   TRANSFORAMINAL LUMBAR INTERBODY FUSION (TLIF) WITH PEDICLE SCREW FIXATION 1 LEVEL Left 05/17/2023   Procedure: LEFT-SIDED LUMBAR 4- LUMBAR 5 TRANSFORAMINAL LUMBAR INTERBODY FUSION AND DECOMPRESSION WITH INSTRUMENTATION AND ALLOGRAFT;  Surgeon: Bobby Anes, MD;  Location: [facility] OR;  Service: Orthopedics;  Laterality: Left;   VIDEO BRONCHOSCOPY N/A 11/27/2022   Procedure: VIDEO BRONCHOSCOPY WITH FLUORO;  Surgeon: Bobby Lamar RAMAN, MD;  Location: [name] ENDOSCOPY;  Service: Cardiopulmonary;  Laterality: N/A;   Patient Active Problem List   Diagnosis Date Noted    Radiculopathy of lumbar region 05/17/2023   CLL (chronic lymphocytic leukemia) (HCC) 02/21/2023   Abnormal CT of the chest 11/01/2022   Melanoma in situ of back (HCC) 03/16/2020   Squamous cell carcinoma in situ 08/21/2019   Vitamin D  deficiency 05/29/2019   Cough 01/01/2016   PCP NOTES >>>> 09/28/2015   Hypogonadism in male 05/13/2015   SIADH (syndrome of inappropriate ADH production) (HCC) 04/13/2013   Memory problem 04/13/2013   Annual physical exam 03/06/2012   Idiopathic scoliosis and kyphoscoliosis 08/18/2010   BACK PAIN, LUMBAR 07/21/2010   Anxiety state 04/01/2009   Essential hypertension, benign 01/04/2009   INSOMNIA-SLEEP DISORDER-UNSPEC 09/08/2008    PCP: Bobby Aloysius BRAVO, MD   REFERRING PROVIDER: Tobie Tonita POUR, DO   REFERRING DIAG: R26.81 (ICD-10-CM) - Unsteady gait R41.89 (ICD-10-CM) - Cognitive changes  THERAPY DIAG:  Other abnormalities of gait and mobility  Muscle weakness (generalized)  RATIONALE FOR EVALUATION AND TREATMENT: Rehabilitation  ONSET DATE: prrogressive over last 6 months  NEXT MD VISIT: after brain MRI   SUBJECTIVE:  SUBJECTIVE STATEMENT: Patient reports he has done his HEP twice since last visit.  Admits just hasn't made time for it.  He denies any falls or med changes.   EVAL: 73 y/o male referred to PT from Dr [name] for unsteady gait and falls.  States he has gotten progressively more unsteady over last 6 months for no apparent reason.  He had therapy here a year ago following a lumbar fusion and did quite well.  He states he really has very little back pain or trouble with his back now.   Balance is his chief c/o.    Neurology notes indicate that they feel his unsteadiness may be residual effects of the back surgery and residual canal stenosis.   They did not feel he has Parkinson's.   However, he is going to be worked up more for his memory deficits and brain MRI is pending.  Patient reports Having difficulty walking up/down steps carrying things up to the second floor of his home and is generally unsteady going out places.  He has difficulty verbalizing his exact symptoms due to what seems to be cognitive issues  Pt accompanied by: self  PAIN: Are you having pain? Yes: NPRS scale: 0/10 now;  intermittent R lateral thigh pain Pain location: R lateral thigh Pain description: aching Aggravating factors: walking >10 min Relieving factors: standing, not walking  PERTINENT HISTORY:  L4/5 TLIF [name] 6/24; CLL, HTN, memory deficit  PRECAUTIONS: Fall  RED FLAGS: None  WEIGHT BEARING RESTRICTIONS: No  FALLS:  Has patient fallen in last 6 months? Yes. Number of falls 0  LIVING ENVIRONMENT: Lives with: lives with their family and lives with their spouse Lives in: House/apartment Stairs: Yes: Internal: 4 steps; can reach both and External: 1 flight steps; on right going up Has following equipment at home: walking stick when going fishing  OCCUPATION: still rents office space out;  owns a building  PLOF: Independent with gait  PATIENT GOALS: improve my balance    OBJECTIVE: (objective measures completed at initial evaluation unless otherwise dated)  DIAGNOSTIC FINDINGS:  Out-side paper records, electronic medical record, and images have been reviewed where available and summarized as:  MRI lumbar spine 02/10/2023: 1. At L4-5 there is a broad-based disc bulge. Severe bilateral facet arthropathy and ligamentum flavum infolding. Severe spinal stenosis. No left foraminal stenosis. Severe right foraminal stenosis. 2. At L5-S1 there is a broad-based disc osteophyte complex. Moderate bilateral facet arthropathy with a small right facet effusion. Severe left foraminal stenosis. Mild right foraminal stenosis. 3. At L3-4 there  is a broad-based disc bulge. Moderate bilateral facet arthropathy. Mild spinal stenosis. Moderate right and mild left foraminal stenosis. 4. No acute osseous injury of the lumbar spine.   MRI cervical spine 02/10/2023: 1. At T1-2 there is a minimal broad-based disc osteophyte complex. Moderate-severe bilateral foraminal stenosis. 2. No acute osseous injury of the thoracic spine. 3. Partially visualized cervical spine spondylosis. If there is further clinical concern, recommend a dedicated MRI of the cervical spine.  COGNITION: Overall cognitive status: Impaired   SENSATION: WFL  COORDINATION: Finger to nose is slightly impaired  EDEMA:  None noted  MUSCLE TONE: WNL  DTRs:  NT   POSTURE:  decreased lumbar lordosis, tends to slouch  MUSCLE LENGTH: Hamstrings: moderate tightness BLE ITB: slight tight R Piriformis: slight tight BLE Hip flexors: NT  LOWER EXTREMITY ROM:     Active  Right eval Left eval  Hip flexion    Hip extension  Hip abduction    Hip adduction    Hip internal rotation    Hip external rotation    Knee flexion    Knee extension    Ankle dorsiflexion    Ankle plantarflexion    Ankle inversion    Ankle eversion     (Blank rows = not tested)  LOWER EXTREMITY MMT:    MMT Right eval Left eval  Hip flexion    Hip extension    Hip abduction    Hip adduction    Hip internal rotation 4- 4  Hip external rotation 4+ 4  Knee flexion 5 5  Knee extension 5 5  Ankle dorsiflexion 4+ 4  Ankle plantarflexion    Ankle inversion    Ankle eversion    (Blank rows = not tested)  BED MOBILITY:  Independent in all aspects  TRANSFERS: Assistive device utilized: None  Sit to stand: SBA Stand to sit: Complete Independence Chair to chair: SBA Floor: NT  GAIT: Distance walked: 150' in clinic Assistive device utilized: None Level of assistance: CGA Gait pattern: staggering gait, frequent losses of balance but can correct or catch  himself Comments:   STAIRS:  Level of Assistance: SBA  Stair Negotiation Technique: Alternating Pattern  with Single Rail on Right  Number of Stairs: 12    Height of Stairs: 7  Comments: goes quickly to compensate for lack of balance  FUNCTIONAL TESTS:  FGA = 17 5X STS = 9.75 sec TUG = 7 sec Gait speed = 3.11 ft/sec  PATIENT SURVEYS:  ABC = 66.9%   TODAY'S TREATMENT:  07/02/24 THERAPEUTIC EXERCISE: To improve strength.  Demonstration, verbal and tactile cues throughout for technique. Bike L4 x 7'  HEP review/Corner balance: Toe raises x 20 BLE Heel raises x 20 BLE Minisquats x 20 BLE Tandem stance x 1' BLE SLS x 1' and 1 finger support BLE   NEUROMUSCULAR RE-EDUCATION: To improve balance. Long foam standing x 1' x 2 Long foam sidestepping with min to mod assist x 3 laps Tandem gait F/B x 5 laps at mat table Braiding forward cross over x 5 laps mat table Braiding backward cross over x 5 laps mat table Backward walking 1 lap around gym with variable min to mod assist   06/24/24 SELF CARE: Provided education on PT POC progression and to improve safety with use of SPC for assistive device, recommended to use a cane out of home;  initial HEP    PATIENT EDUCATION:  Education details: PT eval findings, anticipated POC, and initial HEP  Person educated: Patient and Spouse Education method: Explanation, Demonstration, Verbal cues, Tactile cues, and MedBridgeGO app access provided Education comprehension: verbalized understanding, verbal cues required, tactile cues required, and needs further education  HOME EXERCISE PROGRAM: Access Code: [email or web address] URL: [email or web address] Date: 06/24/2024 Prepared by: Garnette Montclair  Exercises - Toe Raises with Unilateral Counter Support  - 1 x daily - 7 x weekly - 3 sets - 10 reps - Heel Raises with Counter Support  - 1 x daily - 7 x weekly - 3 sets - 10 reps - Mini Squat with Counter Support  - 1 x daily - 7 x  weekly - 3 sets - 10 reps - Standing Tandem Balance with Counter Support  - 1 x daily - 7 x weekly - 1 sets - 1 reps - 1 min hold - Single Leg Stance  - 1 x daily - 7 x weekly - 1 sets - 1 reps - 1 min  hold   ASSESSMENT:  CLINICAL IMPRESSION: 07/02/24:  Patient is progressing with his balance.  HEP is updated and he is encouraged to be more compliant.   He requires constant facilitation with backward gait to slow down speed to avoid LOB backward, take smoother and long steps.  He has difficult with wt shifting with sidestepping and backward ambulation as well.  Bobby [name] is a 73 y.o. male who was referred to physical therapy for evaluation and treatment for unsteady gait and falls.  Patient presents with physical impairments of impaired activity tolerance, impaired standing balance, impaired ambulation, and decreased safety awareness impacting safe and independent functional mobility.  He covers for his poor overall balance by ambulating and transferring very quickly.   However, when he is asked to walk and do activities more slowly with better control, he looses balance much more easily.   Timed tests do not show balance deficits as follows:  Gait speed 3.11 ft/sec, (2.62 ft/sec is needed for community access),  TUG of 7 sec (>13.5 sec indicates increased risk for falls), and 5xSTS of 9.75 sec (>15 sec indicates increased risk for falls and decreased BLE power).  However, his FGA score is 19/30 indicating a definite fall risk.   ABC scale score of 66.9% indicates a moderate level of physical functioning, but I think he overestimates his abilities and/or has decreased insight into his actual deficits.  HE is advised to use at least a  cane for safety.    Bobby [name] will benefit from skilled PT to address above deficits to improve mobility and activity tolerance to help reach the maximal level of functional independence and mobility. Patient demonstrates understanding of this POC and is in  agreement with this plan.   OBJECTIVE IMPAIRMENTS: Abnormal gait, difficulty walking, decreased safety awareness, and impaired perceived functional ability.   ACTIVITY LIMITATIONS: carrying, bending, stairs, and locomotion level  PARTICIPATION LIMITATIONS: laundry, shopping, and community activity  PERSONAL FACTORS: Age, Time since onset of injury/illness/exacerbation, and 1-2 comorbidities:   L4/5 TLIF [name] 6/24; CLL, HTN, memory deficit are also affecting patient's functional outcome.   REHAB POTENTIAL: Good  CLINICAL DECISION MAKING: Evolving/moderate complexity  EVALUATION COMPLEXITY: Moderate   GOALS: Goals reviewed with patient? Yes  SHORT TERM GOALS: Target date: 07/23/2024   Patient will be independent with initial HEP to improve outcomes and carryover.  Baseline: PT assist required Goal status: INITIAL  2.  Patient will be educated on strategies to decrease risk of falls.  Baseline: initial education provided (needs sheet) Goal status: INITIAL  LONG TERM GOALS: Target date: 08/20/2024   Patient will be independent with advanced/ongoing HEP to facilitate ability to maintain/progress functional gains from skilled physical therapy services. Baseline:no advanced HEP yet Goal status: INITIAL  3.  Patient will be able to step up/down curb safely with LRAD for safety with community ambulation.  Baseline: TBD Goal status: INITIAL   4.  Patient will demonstrate improved BLE strength to >/= 5/5 for improved stability and ease of mobility . Baseline: Refer to above LE MMT table Goal status: INITIAL  7.  Patient will improve Berg score to >/= 50/56 to improve safety and stability with ADLs in standing and reduce risk for falls. (MCID= 8 points)  Baseline: TBD Goal status: INITIAL  9. Patient will improve FGA score to at least  24/30 to improve gait stability and reduce risk for falls. Baseline: 19 Goal status: INITIAL  10.  Patient will report >/= 86% on ABC  scale (  MCID = 19%) to demonstrate improved balance confidence with functional mobility and gait. Baseline: 66 Goal status: INITIAL   PLAN:  PT FREQUENCY: 1-2x/week  PT DURATION: 8 weeks  PLANNED INTERVENTIONS: 97164- PT Re-evaluation, 97750- Physical Performance Testing, 97110-Therapeutic exercises, 97530- Therapeutic activity, [id number]- Neuromuscular re-education, 97535- Self Care, 02859- Manual therapy, [id number]- Gait training, [id number]- Electrical stimulation (unattended), 97016- Vasopneumatic device, [id number]- Ultrasound, [id number]- Traction (mechanical), 20560 (1-2 muscles), 20561 (3+ muscles)- Dry Needling, Patient/Family education, Balance training, Stair training, Taping, Joint mobilization, Cryotherapy, and Moist heat  PLAN FOR NEXT SESSION: progress with dynamic balance activities   River Mckercher, PT 07/02/2024, 9:46 PM

## 2024-07-07 ENCOUNTER — Ambulatory Visit (INDEPENDENT_AMBULATORY_CARE_PROVIDER_SITE_OTHER): Admitting: Internal Medicine

## 2024-07-07 ENCOUNTER — Encounter: Payer: Self-pay | Admitting: Internal Medicine

## 2024-07-07 VITALS — BP 118/72 | HR 56 | Temp 98.0°F | Resp 16 | Ht 72.0 in | Wt 180.2 lb

## 2024-07-07 DIAGNOSIS — F1010 Alcohol abuse, uncomplicated: Secondary | ICD-10-CM

## 2024-07-07 NOTE — Patient Instructions (Addendum)
 I will see you in October  I printed the information you are requested.  You can also reach [facility]: [phone]

## 2024-07-07 NOTE — Progress Notes (Unsigned)
 Subjective:    Patient ID: [name], male    DOB: December 17, 1950, 73 y.o.   MRN: [id number]  DOS:  07/07/2024 Type of visit - description: To discuss alcohol Patient reach out few days ago requesting naltrexone for alcohol abuse. I recommended him enroll on a comprehensive program.  Here to discuss above. I am glad you did not prescribe it, will do it on my own.   Review of Systems See above   Past Medical History:  Diagnosis Date   Anxiety and depression    Hypertension    Hyponatremia 03/2013   ongoing, seeing Dr. Tobie   Insomnia    Melanoma in situ of back [facility])    biopsy on 03/05/2020   SIADH (syndrome of inappropriate ADH production) (HCC) 2017   Dr. Tobie   Squamous cell carcinoma in situ     Past Surgical History:  Procedure Laterality Date   BRONCHIAL BIOPSY  11/27/2022   Procedure: BRONCHIAL BIOPSIES;  Surgeon: Shelah Lamar RAMAN, MD;  Location: [facility] ENDOSCOPY;  Service: Cardiopulmonary;;   BRONCHIAL BRUSHINGS  11/27/2022   Procedure: BRONCHIAL BRUSHINGS;  Surgeon: Shelah Lamar RAMAN, MD;  Location: [facility] ENDOSCOPY;  Service: Cardiopulmonary;;   BRONCHIAL WASHINGS  11/27/2022   Procedure: BRONCHIAL WASHINGS;  Surgeon: Shelah Lamar RAMAN, MD;  Location: [name];  Service: Cardiopulmonary;;   MOHS SURGERY  ~ 04/2020   face, [facility]   SHOULDER SURGERY  1980s   right shoulder   TRANSFORAMINAL LUMBAR INTERBODY FUSION (TLIF) WITH PEDICLE SCREW FIXATION 1 LEVEL Left 05/17/2023   Procedure: LEFT-SIDED LUMBAR 4- LUMBAR 5 TRANSFORAMINAL LUMBAR INTERBODY FUSION AND DECOMPRESSION WITH INSTRUMENTATION AND ALLOGRAFT;  Surgeon: Beuford Anes, MD;  Location: [facility] OR;  Service: Orthopedics;  Laterality: Left;   VIDEO BRONCHOSCOPY N/A 11/27/2022   Procedure: VIDEO BRONCHOSCOPY WITH FLUORO;  Surgeon: Shelah Lamar RAMAN, MD;  Location: [facility] ENDOSCOPY;  Service: Cardiopulmonary;  Laterality: N/A;    Current Outpatient Medications  Medication Instructions   atorvastatin  (LIPITOR) 20 mg, Oral, Daily  at bedtime   b complex vitamins capsule 1 capsule, Daily   carvedilol  (COREG ) 6.25 MG tablet TAKE 1 TABLET BY MOUTH TWICE A DAY WITH A MEAL   cholecalciferol  (VITAMIN D3) 1,000 Units, Daily   clonazePAM  (KLONOPIN ) 0.5 MG tablet TAKE 1 TABLET BY MOUTH 2 TIMES A DAY AS NEEDED FOR ANXIETY   folic acid  (FOLVITE ) 1 mg, Oral, Daily   Melatonin 10 mg, Daily at bedtime   Multiple Vitamin (MULTIVITAMIN WITH MINERALS) TABS 1 tablet, Oral, Daily   Pseudoeph-Doxylamine-DM-APAP (NYQUIL PO) Take by mouth. 30 mls q hs.   tadalafil  (CIALIS ) 10 MG tablet TAKE ONE TO TWO TABLETS BY MOUTH EVERY OTHER DAY AS NEEDED   zolpidem  (AMBIEN ) 5 mg, At bedtime PRN       Objective:   Physical Exam BP 118/72   Pulse (!) 56   Temp 98 F (36.7 C) (Oral)   Resp 16   Ht 6' (1.829 m)   Wt 180 lb 4 oz (81.8 kg)   SpO2 97%   BMI 24.45 kg/m  General:   Well developed, NAD, BMI noted. HEENT:  Normocephalic . Face symmetric, atraumatic   Neurologic:  alert & oriented X3.  + Word finding difficulties, poor attention Speech normal, gait appropriate for age and unassisted Psych--  Cognition and judgment appear intact.  Cooperative with normal attention span and concentration.  Behavior appropriate. No anxious or depressed appearing.      Assessment   Assessment  HTN Anxiety, depression,  insomnia : used lexapro , d/c (decreased libido) Daily etoh use  Hypogonadotrophic Hypogonadism: per  Dr Von , used HRT at some point Memory problems? MCI per neuro visit 12/2017 Hyponatremia onset 2014, admitted. Dx SIADH (renal 12/2015:RX to watch fluid intake and sx -gait d/o, MS changes) H/o vit D def Oncology: - SCC: DX 07-2019 -Melanoma dx 02/2020 (R upper back);  ~12-2021nose-Moh's (per pt)  -CLL Dx 2023  Abnormal CT chest, bronchoscopy 11-2022, BX negative for malignant .   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 He drinks mostly beer or wine almost every day.  Hardly ever drinks liquor. He also found out that the NyQuil that he takes contain alcohol. I advised that the treatment requires not only medication but needs a comprehensive program. Again recommend to be seen at the [facility], contact information printed for the patient.  Also will place referral to psychiatry.  See AVS. Also, he is concerned about his liver health, LFTs have been normal all along.  He remains concerned.  We agreed to check liver US . At the next appointment we could check a PT PTT Anxiety, insomnia: Does not take clonazepam  or Ambien  daily. RTC scheduled for October Time spent 30 minutes.  We had a long conversation about alcohol, listening therapy provided, we talk about treatment options, we talked about checking his liver for alcohol disease with ultrasound.   4/9 Here for CPX -Tdap   09/2019 - zostavax 2014, s/p Shingrix x2; s/p RSV -  PNM 23: 31 (age ~49),  Prevnar: 2016.  PNM 20 today. -Vaccines I recommend: Flu shot every fall, COVID booster   -CCS: Dr. Rosalie Cscope ~ 2007,Cscope 08-2009; Tubular adenoma per bx report; colonoscopy 09-2015, no polyps. Cscope 05-17-21 -- 5 years per path report - prostate ca screening: No FH, no symptoms.  Check PSA -Diet and exercise: Discussed. - Available labs reviewed, will get a BMP, FLP, PSA - ACP on file, see media. We also discussed the following: HTN: BP looks good, continue carvedilol . Anxiety, depression, insomnia:  On clonazepam  either half or 1 tablet twice daily as needed. On Ambien  10 mg, due to age recommend to decrease to 5 mg. Also, he apparently watch a lot of TV late at night which is likely contributing to  insomnia.  Recommend good sleep habits. EtOH: Encouraged moderation.  Currently 2-3 servings daily. MCI?  Reports no issues. MSK: Had L4-5 fusion, 05/17/2023, Dr.  Beuford Hyponatremia: LOV nephrology 09/19/2023, was recommended fluid and salt restriction as etiology is felt to be SIADH.  Avoid NSAIDs. CLL: LOV hematology 12/12/2023 Abnormal CT chest: LOV pulmonary 12/19/2023.  Next CT November 2025 RTC 6 months

## 2024-07-08 ENCOUNTER — Encounter: Payer: Self-pay | Admitting: Internal Medicine

## 2024-07-08 NOTE — Assessment & Plan Note (Signed)
 EtOHabuse : The patient has a long history of drinking alcohol almost daily, I asked him if he has a problem and he describes himself as not sure alcoholic but functional. He drinks mostly beer or wine almost every day.  Hardly ever drinks liquor. He also found out that the NyQuil that he takes contain alcohol. I advised that the treatment requires not only medication but needs a comprehensive program. Again recommend to be seen at the [facility], contact information printed for the patient.  Also will place referral to psychiatry.  See AVS. Also, he is concerned about his liver health, LFTs have been normal all along.  He remains concerned.  We agreed to check liver US . At the next appointment we could check a PT PTT Anxiety, insomnia: Does not take clonazepam  or Ambien  daily. MCI: Has word finding difficulty, also poor attention.  Saw neurology last month, MOCA 21/30 indicating a deficit. RTC

## 2024-07-09 ENCOUNTER — Ambulatory Visit

## 2024-07-09 DIAGNOSIS — M5459 Other low back pain: Secondary | ICD-10-CM

## 2024-07-09 DIAGNOSIS — R2689 Other abnormalities of gait and mobility: Secondary | ICD-10-CM | POA: Diagnosis not present

## 2024-07-09 DIAGNOSIS — R2681 Unsteadiness on feet: Secondary | ICD-10-CM | POA: Diagnosis not present

## 2024-07-09 DIAGNOSIS — M6281 Muscle weakness (generalized): Secondary | ICD-10-CM

## 2024-07-09 DIAGNOSIS — R4189 Other symptoms and signs involving cognitive functions and awareness: Secondary | ICD-10-CM | POA: Diagnosis not present

## 2024-07-09 NOTE — Therapy (Signed)
 OUTPATIENT PHYSICAL THERAPY NEURO TREATMENT   Patient Name: Bobby [name] MRN: [id number] DOB:05-Sep-1951, 73 y.o., male Today's Date: 07/09/2024   END OF SESSION:  PT End of Session - 07/09/24 1016     Visit Number 3    PT Start Time 1016    PT Stop Time 1057    PT Time Calculation (min) 41 min    Activity Tolerance Patient tolerated treatment well;No increased pain    Behavior During Therapy [facility] for tasks assessed/performed          Past Medical History:  Diagnosis Date   Anxiety and depression    Hypertension    Hyponatremia 03/2013   ongoing, seeing Dr. Tobie   Insomnia    Melanoma in situ of back [facility])    biopsy on 03/05/2020   SIADH (syndrome of inappropriate ADH production) (HCC) 2017   Dr. Tobie   Squamous cell carcinoma in situ    Past Surgical History:  Procedure Laterality Date   BRONCHIAL BIOPSY  11/27/2022   Procedure: BRONCHIAL BIOPSIES;  Surgeon: Shelah Lamar RAMAN, MD;  Location: [facility] ENDOSCOPY;  Service: Cardiopulmonary;;   BRONCHIAL BRUSHINGS  11/27/2022   Procedure: BRONCHIAL BRUSHINGS;  Surgeon: Shelah Lamar RAMAN, MD;  Location: [facility] ENDOSCOPY;  Service: Cardiopulmonary;;   BRONCHIAL WASHINGS  11/27/2022   Procedure: BRONCHIAL WASHINGS;  Surgeon: Shelah Lamar RAMAN, MD;  Location: [name];  Service: Cardiopulmonary;;   MOHS SURGERY  ~ 04/2020   face, [facility]   SHOULDER SURGERY  1980s   right shoulder   TRANSFORAMINAL LUMBAR INTERBODY FUSION (TLIF) WITH PEDICLE SCREW FIXATION 1 LEVEL Left 05/17/2023   Procedure: LEFT-SIDED LUMBAR 4- LUMBAR 5 TRANSFORAMINAL LUMBAR INTERBODY FUSION AND DECOMPRESSION WITH INSTRUMENTATION AND ALLOGRAFT;  Surgeon: Beuford Anes, MD;  Location: [facility] OR;  Service: Orthopedics;  Laterality: Left;   VIDEO BRONCHOSCOPY N/A 11/27/2022   Procedure: VIDEO BRONCHOSCOPY WITH FLUORO;  Surgeon: Shelah Lamar RAMAN, MD;  Location: [facility] ENDOSCOPY;  Service: Cardiopulmonary;  Laterality: N/A;   Patient Active Problem List   Diagnosis Date Noted    Radiculopathy of lumbar region 05/17/2023   CLL (chronic lymphocytic leukemia) (HCC) 02/21/2023   Abnormal CT of the chest 11/01/2022   Melanoma in situ of back (HCC) 03/16/2020   Squamous cell carcinoma in situ 08/21/2019   Vitamin D  deficiency 05/29/2019   PCP NOTES >>>> 09/28/2015   Hypogonadism in male 05/13/2015   SIADH (syndrome of inappropriate ADH production) (HCC) 04/13/2013   MCI (mild cognitive impairment) with memory loss 04/13/2013   Alcohol abuse 04/13/2013   Annual physical exam 03/06/2012   Idiopathic scoliosis and kyphoscoliosis 08/18/2010   BACK PAIN, LUMBAR 07/21/2010   Anxiety state 04/01/2009   Essential hypertension, benign 01/04/2009   Insomnia 09/08/2008    PCP: Amon Aloysius BRAVO, MD   REFERRING PROVIDER: Tobie Tonita POUR, DO   REFERRING DIAG: R26.81 (ICD-10-CM) - Unsteady gait R41.89 (ICD-10-CM) - Cognitive changes  THERAPY DIAG:  Other abnormalities of gait and mobility  Muscle weakness (generalized)  Other low back pain  RATIONALE FOR EVALUATION AND TREATMENT: Rehabilitation  ONSET DATE: prrogressive over last 6 months  NEXT MD VISIT: after brain MRI   SUBJECTIVE:  SUBJECTIVE STATEMENT: Pt reports unsure whether or not he did his HEP this week but did last week.  EVAL: 73 y/o male referred to PT from Dr Tobie for unsteady gait and falls.  States he has gotten progressively more unsteady over last 6 months for no apparent reason.  He had therapy here a year ago following a lumbar fusion and did quite well.  He states he really has very little back pain or trouble with his back now.   Balance is his chief c/o.    Neurology notes indicate that they feel his unsteadiness may be residual effects of the back surgery and residual canal stenosis.  They did not feel  he has Parkinson's.   However, he is going to be worked up more for his memory deficits and brain MRI is pending.  Patient reports Having difficulty walking up/down steps carrying things up to the second floor of his home and is generally unsteady going out places.  He has difficulty verbalizing his exact symptoms due to what seems to be cognitive issues  Pt accompanied by: self  PAIN: Are you having pain? Yes: NPRS scale: 0/10 now;  intermittent R lateral thigh pain Pain location: R lateral thigh Pain description: aching Aggravating factors: walking >10 min Relieving factors: standing, not walking  PERTINENT HISTORY:  L4/5 TLIF [facility] 6/24; CLL, HTN, memory deficit  PRECAUTIONS: Fall  RED FLAGS: None  WEIGHT BEARING RESTRICTIONS: No  FALLS:  Has patient fallen in last 6 months? Yes. Number of falls 0  LIVING ENVIRONMENT: Lives with: lives with their family and lives with their spouse Lives in: House/apartment Stairs: Yes: Internal: 4 steps; can reach both and External: 1 flight steps; on right going up Has following equipment at home: walking stick when going fishing  OCCUPATION: still rents office space out;  owns a building  PLOF: Independent with gait  PATIENT GOALS: improve my balance    OBJECTIVE: (objective measures completed at initial evaluation unless otherwise dated)  DIAGNOSTIC FINDINGS:  Out-side paper records, electronic medical record, and images have been reviewed where available and summarized as:  MRI lumbar spine 02/10/2023: 1. At L4-5 there is a broad-based disc bulge. Severe bilateral facet arthropathy and ligamentum flavum infolding. Severe spinal stenosis. No left foraminal stenosis. Severe right foraminal stenosis. 2. At L5-S1 there is a broad-based disc osteophyte complex. Moderate bilateral facet arthropathy with a small right facet effusion. Severe left foraminal stenosis. Mild right foraminal stenosis. 3. At L3-4 there is a broad-based  disc bulge. Moderate bilateral facet arthropathy. Mild spinal stenosis. Moderate right and mild left foraminal stenosis. 4. No acute osseous injury of the lumbar spine.   MRI cervical spine 02/10/2023: 1. At T1-2 there is a minimal broad-based disc osteophyte complex. Moderate-severe bilateral foraminal stenosis. 2. No acute osseous injury of the thoracic spine. 3. Partially visualized cervical spine spondylosis. If there is further clinical concern, recommend a dedicated MRI of the cervical spine.  COGNITION: Overall cognitive status: Impaired   SENSATION: WFL  COORDINATION: Finger to nose is slightly impaired  EDEMA:  None noted  MUSCLE TONE: WNL  DTRs:  NT   POSTURE:  decreased lumbar lordosis, tends to slouch  MUSCLE LENGTH: Hamstrings: moderate tightness BLE ITB: slight tight R Piriformis: slight tight BLE Hip flexors: NT  LOWER EXTREMITY ROM:     Active  Right eval Left eval  Hip flexion    Hip extension    Hip abduction    Hip adduction    Hip internal  rotation    Hip external rotation    Knee flexion    Knee extension    Ankle dorsiflexion    Ankle plantarflexion    Ankle inversion    Ankle eversion     (Blank rows = not tested)  LOWER EXTREMITY MMT:    MMT Right eval Left eval  Hip flexion    Hip extension    Hip abduction    Hip adduction    Hip internal rotation 4- 4  Hip external rotation 4+ 4  Knee flexion 5 5  Knee extension 5 5  Ankle dorsiflexion 4+ 4  Ankle plantarflexion    Ankle inversion    Ankle eversion    (Blank rows = not tested)  BED MOBILITY:  Independent in all aspects  TRANSFERS: Assistive device utilized: None  Sit to stand: SBA Stand to sit: Complete Independence Chair to chair: SBA Floor: NT  GAIT: Distance walked: 150' in clinic Assistive device utilized: None Level of assistance: CGA Gait pattern: staggering gait, frequent losses of balance but can correct or catch himself Comments:    STAIRS:  Level of Assistance: SBA  Stair Negotiation Technique: Alternating Pattern  with Single Rail on Right  Number of Stairs: 12    Height of Stairs: 7  Comments: goes quickly to compensate for lack of balance  FUNCTIONAL TESTS:  FGA = 17 5X STS = 9.75 sec TUG = 7 sec Gait speed = 3.11 ft/sec  PATIENT SURVEYS:  ABC = 66.9%   TODAY'S TREATMENT:  07/09/24  Nustep L5x14min  NEUROMUSCULAR RE-EDUCATION: To improve balance, kinesthesia and proprioception. Tandem gait F/B x 5 laps at mat table Braiding forward cross over x 3 laps counter top down/back Braiding backward cross over x 3 laps counter top down/back Retro and fwd gait along counter 3x w/o support Standing on airex:  Narrow BOS x 1'  Normal BOS EC x 30'  3 way Stepping floor x 10 B    07/02/24 THERAPEUTIC EXERCISE: To improve strength.  Demonstration, verbal and tactile cues throughout for technique. Bike L4 x 7'  HEP review/Corner balance: Toe raises x 20 BLE Heel raises x 20 BLE Minisquats x 20 BLE Tandem stance x 1' BLE SLS x 1' and 1 finger support BLE   NEUROMUSCULAR RE-EDUCATION: To improve balance. Long foam standing x 1' x 2 Long foam sidestepping with min to mod assist x 3 laps Tandem gait F/B x 5 laps at mat table Braiding forward cross over x 5 laps mat table Braiding backward cross over x 5 laps mat table Backward walking 1 lap around gym with variable min to mod assist   06/24/24 SELF CARE: Provided education on PT POC progression and to improve safety with use of SPC for assistive device, recommended to use a cane out of home;  initial HEP    PATIENT EDUCATION:  Education details: PT eval findings, anticipated POC, and initial HEP  Person educated: Patient and Spouse Education method: Explanation, Demonstration, Verbal cues, Tactile cues, and MedBridgeGO app access provided Education comprehension: verbalized understanding, verbal cues required, tactile cues required, and needs further  education  HOME EXERCISE PROGRAM: Access Code: [email or web address] URL: [email or web address] Date: 06/24/2024 Prepared by: Garnette Montclair  Exercises - Toe Raises with Unilateral Counter Support  - 1 x daily - 7 x weekly - 3 sets - 10 reps - Heel Raises with Counter Support  - 1 x daily - 7 x weekly - 3 sets - 10 reps - Mini Squat with Counter  Support  - 1 x daily - 7 x weekly - 3 sets - 10 reps - Standing Tandem Balance with Counter Support  - 1 x daily - 7 x weekly - 1 sets - 1 reps - 1 min hold - Single Leg Stance  - 1 x daily - 7 x weekly - 1 sets - 1 reps - 1 min hold   ASSESSMENT:  CLINICAL IMPRESSION: Continued progressing balance interventions to tolerance. Pt with may instances of LOB and catching feet on floor but MinA needed for recovery. Continues to show the need for balance interventions.  Bobby [name] is a 73 y.o. male who was referred to physical therapy for evaluation and treatment for unsteady gait and falls.  Patient presents with physical impairments of impaired activity tolerance, impaired standing balance, impaired ambulation, and decreased safety awareness impacting safe and independent functional mobility.  He covers for his poor overall balance by ambulating and transferring very quickly.   However, when he is asked to walk and do activities more slowly with better control, he looses balance much more easily.   Timed tests do not show balance deficits as follows:  Gait speed 3.11 ft/sec, (2.62 ft/sec is needed for community access),  TUG of 7 sec (>13.5 sec indicates increased risk for falls), and 5xSTS of 9.75 sec (>15 sec indicates increased risk for falls and decreased BLE power).  However, his FGA score is 19/30 indicating a definite fall risk.   ABC scale score of 66.9% indicates a moderate level of physical functioning, but I think he overestimates his abilities and/or has decreased insight into his actual deficits.  HE is advised to use at least a   cane for safety.    Abner Ardis will benefit from skilled PT to address above deficits to improve mobility and activity tolerance to help reach the maximal level of functional independence and mobility. Patient demonstrates understanding of this POC and is in agreement with this plan.   OBJECTIVE IMPAIRMENTS: Abnormal gait, difficulty walking, decreased safety awareness, and impaired perceived functional ability.   ACTIVITY LIMITATIONS: carrying, bending, stairs, and locomotion level  PARTICIPATION LIMITATIONS: laundry, shopping, and community activity  PERSONAL FACTORS: Age, Time since onset of injury/illness/exacerbation, and 1-2 comorbidities:   L4/5 TLIF [facility] 6/24; CLL, HTN, memory deficit are also affecting patient's functional outcome.   REHAB POTENTIAL: Good  CLINICAL DECISION MAKING: Evolving/moderate complexity  EVALUATION COMPLEXITY: Moderate   GOALS: Goals reviewed with patient? Yes  SHORT TERM GOALS: Target date: 07/23/2024   Patient will be independent with initial HEP to improve outcomes and carryover.  Baseline: PT assist required Goal status: INITIAL  2.  Patient will be educated on strategies to decrease risk of falls.  Baseline: initial education provided (needs sheet) Goal status: INITIAL  LONG TERM GOALS: Target date: 08/20/2024   Patient will be independent with advanced/ongoing HEP to facilitate ability to maintain/progress functional gains from skilled physical therapy services. Baseline:no advanced HEP yet Goal status: INITIAL  3.  Patient will be able to step up/down curb safely with LRAD for safety with community ambulation.  Baseline: TBD Goal status: INITIAL   4.  Patient will demonstrate improved BLE strength to >/= 5/5 for improved stability and ease of mobility . Baseline: Refer to above LE MMT table Goal status: INITIAL  7.  Patient will improve Berg score to >/= 50/56 to improve safety and stability with ADLs in standing and reduce  risk for falls. (MCID= 8 points)  Baseline: TBD Goal status:  INITIAL  9. Patient will improve FGA score to at least  24/30 to improve gait stability and reduce risk for falls. Baseline: 19 Goal status: INITIAL  10.  Patient will report >/= 86% on ABC scale (MCID = 19%) to demonstrate improved balance confidence with functional mobility and gait. Baseline: 66 Goal status: INITIAL   PLAN:  PT FREQUENCY: 1-2x/week  PT DURATION: 8 weeks  PLANNED INTERVENTIONS: 97164- PT Re-evaluation, 97750- Physical Performance Testing, 97110-Therapeutic exercises, 97530- Therapeutic activity, [id number]- Neuromuscular re-education, 97535- Self Care, [id number]- Manual therapy, [id number]- Gait training, [phone]- Electrical stimulation (unattended), 97016- Vasopneumatic device, [id number]- Ultrasound, [id number]- Traction (mechanical), 20560 (1-2 muscles), 20561 (3+ muscles)- Dry Needling, Patient/Family education, Balance training, Stair training, Taping, Joint mobilization, Cryotherapy, and Moist heat  PLAN FOR NEXT SESSION: progress with dynamic balance activities   Sol LITTIE Gaskins, PTA 07/09/2024, 10:57 AM

## 2024-07-11 ENCOUNTER — Ambulatory Visit (HOSPITAL_BASED_OUTPATIENT_CLINIC_OR_DEPARTMENT_OTHER)
Admission: RE | Admit: 2024-07-11 | Discharge: 2024-07-11 | Disposition: A | Discharge status: Home / Self Care | Source: Ambulatory Visit | Attending: Internal Medicine | Admitting: Internal Medicine

## 2024-07-11 ENCOUNTER — Ambulatory Visit

## 2024-07-11 DIAGNOSIS — R2689 Other abnormalities of gait and mobility: Secondary | ICD-10-CM | POA: Diagnosis not present

## 2024-07-11 DIAGNOSIS — M6281 Muscle weakness (generalized): Secondary | ICD-10-CM | POA: Diagnosis not present

## 2024-07-11 DIAGNOSIS — F1010 Alcohol abuse, uncomplicated: Secondary | ICD-10-CM | POA: Insufficient documentation

## 2024-07-11 DIAGNOSIS — K7689 Other specified diseases of liver: Secondary | ICD-10-CM | POA: Diagnosis not present

## 2024-07-11 DIAGNOSIS — M5459 Other low back pain: Secondary | ICD-10-CM | POA: Diagnosis not present

## 2024-07-11 DIAGNOSIS — R2681 Unsteadiness on feet: Secondary | ICD-10-CM | POA: Diagnosis not present

## 2024-07-11 DIAGNOSIS — R160 Hepatomegaly, not elsewhere classified: Secondary | ICD-10-CM | POA: Diagnosis not present

## 2024-07-11 DIAGNOSIS — R4189 Other symptoms and signs involving cognitive functions and awareness: Secondary | ICD-10-CM | POA: Diagnosis not present

## 2024-07-11 NOTE — Therapy (Signed)
 OUTPATIENT PHYSICAL THERAPY NEURO TREATMENT   Patient Name: Bobby [name] MRN: [id number] DOB:07-29-1951, 73 y.o., male Today's Date: 07/11/2024   END OF SESSION:  PT End of Session - 07/11/24 1027     Visit Number 4    PT Start Time 1020    PT Stop Time 1102    PT Time Calculation (min) 42 min    Activity Tolerance Patient tolerated treatment well;No increased pain    Behavior During Therapy [facility] for tasks assessed/performed          Past Medical History:  Diagnosis Date   Anxiety and depression    Hypertension    Hyponatremia 03/2013   ongoing, seeing Dr. Tobie   Insomnia    Melanoma in situ of back [facility])    biopsy on 03/05/2020   SIADH (syndrome of inappropriate ADH production) (HCC) 2017   Dr. Tobie   Squamous cell carcinoma in situ    Past Surgical History:  Procedure Laterality Date   BRONCHIAL BIOPSY  11/27/2022   Procedure: BRONCHIAL BIOPSIES;  Surgeon: Shelah Lamar RAMAN, MD;  Location: [facility] ENDOSCOPY;  Service: Cardiopulmonary;;   BRONCHIAL BRUSHINGS  11/27/2022   Procedure: BRONCHIAL BRUSHINGS;  Surgeon: Shelah Lamar RAMAN, MD;  Location: [facility] ENDOSCOPY;  Service: Cardiopulmonary;;   BRONCHIAL WASHINGS  11/27/2022   Procedure: BRONCHIAL WASHINGS;  Surgeon: Shelah Lamar RAMAN, MD;  Location: [name];  Service: Cardiopulmonary;;   MOHS SURGERY  ~ 04/2020   face, [facility]   SHOULDER SURGERY  1980s   right shoulder   TRANSFORAMINAL LUMBAR INTERBODY FUSION (TLIF) WITH PEDICLE SCREW FIXATION 1 LEVEL Left 05/17/2023   Procedure: LEFT-SIDED LUMBAR 4- LUMBAR 5 TRANSFORAMINAL LUMBAR INTERBODY FUSION AND DECOMPRESSION WITH INSTRUMENTATION AND ALLOGRAFT;  Surgeon: Beuford Anes, MD;  Location: [facility] OR;  Service: Orthopedics;  Laterality: Left;   VIDEO BRONCHOSCOPY N/A 11/27/2022   Procedure: VIDEO BRONCHOSCOPY WITH FLUORO;  Surgeon: Shelah Lamar RAMAN, MD;  Location: [facility] ENDOSCOPY;  Service: Cardiopulmonary;  Laterality: N/A;   Patient Active Problem List   Diagnosis Date Noted    Radiculopathy of lumbar region 05/17/2023   CLL (chronic lymphocytic leukemia) (HCC) 02/21/2023   Abnormal CT of the chest 11/01/2022   Melanoma in situ of back (HCC) 03/16/2020   Squamous cell carcinoma in situ 08/21/2019   Vitamin D  deficiency 05/29/2019   PCP NOTES >>>> 09/28/2015   Hypogonadism in male 05/13/2015   SIADH (syndrome of inappropriate ADH production) (HCC) 04/13/2013   MCI (mild cognitive impairment) with memory loss 04/13/2013   Alcohol abuse 04/13/2013   Annual physical exam 03/06/2012   Idiopathic scoliosis and kyphoscoliosis 08/18/2010   BACK PAIN, LUMBAR 07/21/2010   Anxiety state 04/01/2009   Essential hypertension, benign 01/04/2009   Insomnia 09/08/2008    PCP: Amon Aloysius BRAVO, MD   REFERRING PROVIDER: Tobie Tonita POUR, DO   REFERRING DIAG: R26.81 (ICD-10-CM) - Unsteady gait R41.89 (ICD-10-CM) - Cognitive changes  THERAPY DIAG:  Other abnormalities of gait and mobility  Muscle weakness (generalized)  Other low back pain  RATIONALE FOR EVALUATION AND TREATMENT: Rehabilitation  ONSET DATE: prrogressive over last 6 months  NEXT MD VISIT: after brain MRI   SUBJECTIVE:  SUBJECTIVE STATEMENT: Doing good today  EVAL: 73 y/o male referred to PT from Dr Tobie for unsteady gait and falls.  States he has gotten progressively more unsteady over last 6 months for no apparent reason.  He had therapy here a year ago following a lumbar fusion and did quite well.  He states he really has very little back pain or trouble with his back now.   Balance is his chief c/o.    Neurology notes indicate that they feel his unsteadiness may be residual effects of the back surgery and residual canal stenosis.  They did not feel he has Parkinson's.   However, he is going to be worked up  more for his memory deficits and brain MRI is pending.  Patient reports Having difficulty walking up/down steps carrying things up to the second floor of his home and is generally unsteady going out places.  He has difficulty verbalizing his exact symptoms due to what seems to be cognitive issues  Pt accompanied by: self  PAIN: Are you having pain? Yes: NPRS scale: 0/10 now;  intermittent R lateral thigh pain Pain location: R lateral thigh Pain description: aching Aggravating factors: walking >10 min Relieving factors: standing, not walking  PERTINENT HISTORY:  L4/5 TLIF [facility] 6/24; CLL, HTN, memory deficit  PRECAUTIONS: Fall  RED FLAGS: None  WEIGHT BEARING RESTRICTIONS: No  FALLS:  Has patient fallen in last 6 months? Yes. Number of falls 0  LIVING ENVIRONMENT: Lives with: lives with their family and lives with their spouse Lives in: House/apartment Stairs: Yes: Internal: 4 steps; can reach both and External: 1 flight steps; on right going up Has following equipment at home: walking stick when going fishing  OCCUPATION: still rents office space out;  owns a building  PLOF: Independent with gait  PATIENT GOALS: improve my balance    OBJECTIVE: (objective measures completed at initial evaluation unless otherwise dated)  DIAGNOSTIC FINDINGS:  Out-side paper records, electronic medical record, and images have been reviewed where available and summarized as:  MRI lumbar spine 02/10/2023: 1. At L4-5 there is a broad-based disc bulge. Severe bilateral facet arthropathy and ligamentum flavum infolding. Severe spinal stenosis. No left foraminal stenosis. Severe right foraminal stenosis. 2. At L5-S1 there is a broad-based disc osteophyte complex. Moderate bilateral facet arthropathy with a small right facet effusion. Severe left foraminal stenosis. Mild right foraminal stenosis. 3. At L3-4 there is a broad-based disc bulge. Moderate bilateral facet arthropathy. Mild  spinal stenosis. Moderate right and mild left foraminal stenosis. 4. No acute osseous injury of the lumbar spine.   MRI cervical spine 02/10/2023: 1. At T1-2 there is a minimal broad-based disc osteophyte complex. Moderate-severe bilateral foraminal stenosis. 2. No acute osseous injury of the thoracic spine. 3. Partially visualized cervical spine spondylosis. If there is further clinical concern, recommend a dedicated MRI of the cervical spine.  COGNITION: Overall cognitive status: Impaired   SENSATION: WFL  COORDINATION: Finger to nose is slightly impaired  EDEMA:  None noted  MUSCLE TONE: WNL  DTRs:  NT   POSTURE:  decreased lumbar lordosis, tends to slouch  MUSCLE LENGTH: Hamstrings: moderate tightness BLE ITB: slight tight R Piriformis: slight tight BLE Hip flexors: NT  LOWER EXTREMITY ROM:     Active  Right eval Left eval  Hip flexion    Hip extension    Hip abduction    Hip adduction    Hip internal rotation    Hip external rotation    Knee flexion  Knee extension    Ankle dorsiflexion    Ankle plantarflexion    Ankle inversion    Ankle eversion     (Blank rows = not tested)  LOWER EXTREMITY MMT:    MMT Right eval Left eval  Hip flexion    Hip extension    Hip abduction    Hip adduction    Hip internal rotation 4- 4  Hip external rotation 4+ 4  Knee flexion 5 5  Knee extension 5 5  Ankle dorsiflexion 4+ 4  Ankle plantarflexion    Ankle inversion    Ankle eversion    (Blank rows = not tested)  BED MOBILITY:  Independent in all aspects  TRANSFERS: Assistive device utilized: None  Sit to stand: SBA Stand to sit: Complete Independence Chair to chair: SBA Floor: NT  GAIT: Distance walked: 150' in clinic Assistive device utilized: None Level of assistance: CGA Gait pattern: staggering gait, frequent losses of balance but can correct or catch himself Comments:   STAIRS:  Level of Assistance: SBA  Stair Negotiation Technique:  Alternating Pattern  with Single Rail on Right  Number of Stairs: 12    Height of Stairs: 7  Comments: goes quickly to compensate for lack of balance  FUNCTIONAL TESTS:  FGA = 17 5X STS = 9.75 sec TUG = 7 sec Gait speed = 3.11 ft/sec  PATIENT SURVEYS:  ABC = 66.9%   TODAY'S TREATMENT:  07/11/24  Nustep L5x47min  NEUROMUSCULAR RE-EDUCATION: To improve balance, kinesthesia and proprioception. Resisted gait black TB 3 laps each direction Multifidus walkout Black TB x 10 each way Standing hip abduction x 10 RTB Standing hip extension 2x10 RTB Clock balance 5x R/L 12 to 6 o'clock  07/09/24  Nustep L5x35min  NEUROMUSCULAR RE-EDUCATION: To improve balance, kinesthesia and proprioception. Tandem gait F/B x 5 laps at mat table Braiding forward cross over x 3 laps counter top down/back Braiding backward cross over x 3 laps counter top down/back Retro and fwd gait along counter 3x w/o support Standing on airex:  Narrow BOS x 1'  Normal BOS EC x 30'  3 way Stepping floor x 10 B    07/02/24 THERAPEUTIC EXERCISE: To improve strength.  Demonstration, verbal and tactile cues throughout for technique. Bike L4 x 7'  HEP review/Corner balance: Toe raises x 20 BLE Heel raises x 20 BLE Minisquats x 20 BLE Tandem stance x 1' BLE SLS x 1' and 1 finger support BLE   NEUROMUSCULAR RE-EDUCATION: To improve balance. Long foam standing x 1' x 2 Long foam sidestepping with min to mod assist x 3 laps Tandem gait F/B x 5 laps at mat table Braiding forward cross over x 5 laps mat table Braiding backward cross over x 5 laps mat table Backward walking 1 lap around gym with variable min to mod assist   06/24/24 SELF CARE: Provided education on PT POC progression and to improve safety with use of SPC for assistive device, recommended to use a cane out of home;  initial HEP    PATIENT EDUCATION:  Education details: PT eval findings, anticipated POC, and initial HEP  Person educated: Patient  and Spouse Education method: Explanation, Demonstration, Verbal cues, Tactile cues, and MedBridgeGO app access provided Education comprehension: verbalized understanding, verbal cues required, tactile cues required, and needs further education  HOME EXERCISE PROGRAM: Access Code: [email or web address] URL: [email or web address] Date: 06/24/2024 Prepared by: Garnette Montclair  Exercises - Toe Raises with Unilateral Counter Support  - 1 x daily - 7  x weekly - 3 sets - 10 reps - Heel Raises with Counter Support  - 1 x daily - 7 x weekly - 3 sets - 10 reps - Mini Squat with Counter Support  - 1 x daily - 7 x weekly - 3 sets - 10 reps - Standing Tandem Balance with Counter Support  - 1 x daily - 7 x weekly - 1 sets - 1 reps - 1 min hold - Single Leg Stance  - 1 x daily - 7 x weekly - 1 sets - 1 reps - 1 min hold   ASSESSMENT:  CLINICAL IMPRESSION: Able to progress balance interventions today with no issues. Introduced resisted walking multidirectional and more single leg reaching. Some LOB with posterior toe taps on clocks, minA to recover. Pt at times needing to be redirected throughout session.  Bobby [name] is a 73 y.o. male who was referred to physical therapy for evaluation and treatment for unsteady gait and falls.  Patient presents with physical impairments of impaired activity tolerance, impaired standing balance, impaired ambulation, and decreased safety awareness impacting safe and independent functional mobility.  He covers for his poor overall balance by ambulating and transferring very quickly.   However, when he is asked to walk and do activities more slowly with better control, he looses balance much more easily.   Timed tests do not show balance deficits as follows:  Gait speed 3.11 ft/sec, (2.62 ft/sec is needed for community access),  TUG of 7 sec (>13.5 sec indicates increased risk for falls), and 5xSTS of 9.75 sec (>15 sec indicates increased risk for falls and decreased  BLE power).  However, his FGA score is 19/30 indicating a definite fall risk.   ABC scale score of 66.9% indicates a moderate level of physical functioning, but I think he overestimates his abilities and/or has decreased insight into his actual deficits.  HE is advised to use at least a  cane for safety.    Marshawn Normoyle will benefit from skilled PT to address above deficits to improve mobility and activity tolerance to help reach the maximal level of functional independence and mobility. Patient demonstrates understanding of this POC and is in agreement with this plan.   OBJECTIVE IMPAIRMENTS: Abnormal gait, difficulty walking, decreased safety awareness, and impaired perceived functional ability.   ACTIVITY LIMITATIONS: carrying, bending, stairs, and locomotion level  PARTICIPATION LIMITATIONS: laundry, shopping, and community activity  PERSONAL FACTORS: Age, Time since onset of injury/illness/exacerbation, and 1-2 comorbidities:   L4/5 TLIF [facility] 6/24; CLL, HTN, memory deficit are also affecting patient's functional outcome.   REHAB POTENTIAL: Good  CLINICAL DECISION MAKING: Evolving/moderate complexity  EVALUATION COMPLEXITY: Moderate   GOALS: Goals reviewed with patient? Yes  SHORT TERM GOALS: Target date: 07/23/2024   Patient will be independent with initial HEP to improve outcomes and carryover.  Baseline: PT assist required Goal status: INITIAL  2.  Patient will be educated on strategies to decrease risk of falls.  Baseline: initial education provided (needs sheet) Goal status: INITIAL  LONG TERM GOALS: Target date: 08/20/2024   Patient will be independent with advanced/ongoing HEP to facilitate ability to maintain/progress functional gains from skilled physical therapy services. Baseline:no advanced HEP yet Goal status: INITIAL  3.  Patient will be able to step up/down curb safely with LRAD for safety with community ambulation.  Baseline: TBD Goal status: INITIAL    4.  Patient will demonstrate improved BLE strength to >/= 5/5 for improved stability and ease of mobility . Baseline:  Refer to above LE MMT table Goal status: INITIAL  7.  Patient will improve Berg score to >/= 50/56 to improve safety and stability with ADLs in standing and reduce risk for falls. (MCID= 8 points)  Baseline: TBD Goal status: INITIAL  9. Patient will improve FGA score to at least  24/30 to improve gait stability and reduce risk for falls. Baseline: 19 Goal status: INITIAL  10.  Patient will report >/= 86% on ABC scale (MCID = 19%) to demonstrate improved balance confidence with functional mobility and gait. Baseline: 66 Goal status: INITIAL   PLAN:  PT FREQUENCY: 1-2x/week  PT DURATION: 8 weeks  PLANNED INTERVENTIONS: 97164- PT Re-evaluation, 97750- Physical Performance Testing, 97110-Therapeutic exercises, 97530- Therapeutic activity, [id number]- Neuromuscular re-education, 97535- Self Care, [id number]- Manual therapy, [id number]- Gait training, [phone]- Electrical stimulation (unattended), 97016- Vasopneumatic device, [id number]- Ultrasound, [id number]- Traction (mechanical), 20560 (1-2 muscles), 20561 (3+ muscles)- Dry Needling, Patient/Family education, Balance training, Stair training, Taping, Joint mobilization, Cryotherapy, and Moist heat  PLAN FOR NEXT SESSION: progress with dynamic balance activities   Sol LITTIE Gaskins, PTA 07/11/2024, 11:09 AM

## 2024-07-15 ENCOUNTER — Ambulatory Visit

## 2024-07-15 ENCOUNTER — Telehealth: Payer: Self-pay

## 2024-07-15 DIAGNOSIS — M6281 Muscle weakness (generalized): Secondary | ICD-10-CM

## 2024-07-15 DIAGNOSIS — R4189 Other symptoms and signs involving cognitive functions and awareness: Secondary | ICD-10-CM | POA: Diagnosis not present

## 2024-07-15 DIAGNOSIS — R2681 Unsteadiness on feet: Secondary | ICD-10-CM | POA: Diagnosis not present

## 2024-07-15 DIAGNOSIS — R2689 Other abnormalities of gait and mobility: Secondary | ICD-10-CM | POA: Diagnosis not present

## 2024-07-15 DIAGNOSIS — M5459 Other low back pain: Secondary | ICD-10-CM | POA: Diagnosis not present

## 2024-07-15 NOTE — Telephone Encounter (Signed)
 Waiting for radiology read back on ultrasound.

## 2024-07-15 NOTE — Telephone Encounter (Signed)
 Copied from CRM #[id number]. Topic: Clinical - Lab/Test Results >> Jul 15, 2024  8:44 AM Deaijah H wrote: Reason for CRM: Patient called in requesting to speak with Dr. Amon nurse due to not receiving his test results back from last Friday. Please [phone]

## 2024-07-15 NOTE — Therapy (Signed)
 OUTPATIENT PHYSICAL THERAPY NEURO TREATMENT   Patient Name: Bobby [name] MRN: [id number] DOB:May 26, 1951, 73 y.o., male Today's Date: 07/15/2024   END OF SESSION:  PT End of Session - 07/15/24 1058     Visit Number 5    PT Start Time 1017    PT Stop Time 1058    PT Time Calculation (min) 41 min    Activity Tolerance Patient tolerated treatment well;No increased pain    Behavior During Therapy [facility] for tasks assessed/performed           Past Medical History:  Diagnosis Date   Anxiety and depression    Hypertension    Hyponatremia 03/2013   ongoing, seeing Dr. Tobie   Insomnia    Melanoma in situ of back [facility])    biopsy on 03/05/2020   SIADH (syndrome of inappropriate ADH production) (HCC) 2017   Dr. Tobie   Squamous cell carcinoma in situ    Past Surgical History:  Procedure Laterality Date   BRONCHIAL BIOPSY  11/27/2022   Procedure: BRONCHIAL BIOPSIES;  Surgeon: Shelah Lamar RAMAN, MD;  Location: [facility] ENDOSCOPY;  Service: Cardiopulmonary;;   BRONCHIAL BRUSHINGS  11/27/2022   Procedure: BRONCHIAL BRUSHINGS;  Surgeon: Shelah Lamar RAMAN, MD;  Location: [facility] ENDOSCOPY;  Service: Cardiopulmonary;;   BRONCHIAL WASHINGS  11/27/2022   Procedure: BRONCHIAL WASHINGS;  Surgeon: Shelah Lamar RAMAN, MD;  Location: [name];  Service: Cardiopulmonary;;   MOHS SURGERY  ~ 04/2020   face, [facility]   SHOULDER SURGERY  1980s   right shoulder   TRANSFORAMINAL LUMBAR INTERBODY FUSION (TLIF) WITH PEDICLE SCREW FIXATION 1 LEVEL Left 05/17/2023   Procedure: LEFT-SIDED LUMBAR 4- LUMBAR 5 TRANSFORAMINAL LUMBAR INTERBODY FUSION AND DECOMPRESSION WITH INSTRUMENTATION AND ALLOGRAFT;  Surgeon: Beuford Anes, MD;  Location: [facility] OR;  Service: Orthopedics;  Laterality: Left;   VIDEO BRONCHOSCOPY N/A 11/27/2022   Procedure: VIDEO BRONCHOSCOPY WITH FLUORO;  Surgeon: Shelah Lamar RAMAN, MD;  Location: [facility] ENDOSCOPY;  Service: Cardiopulmonary;  Laterality: N/A;   Patient Active Problem List   Diagnosis Date Noted    Radiculopathy of lumbar region 05/17/2023   CLL (chronic lymphocytic leukemia) (HCC) 02/21/2023   Abnormal CT of the chest 11/01/2022   Melanoma in situ of back (HCC) 03/16/2020   Squamous cell carcinoma in situ 08/21/2019   Vitamin D  deficiency 05/29/2019   PCP NOTES >>>> 09/28/2015   Hypogonadism in male 05/13/2015   SIADH (syndrome of inappropriate ADH production) (HCC) 04/13/2013   MCI (mild cognitive impairment) with memory loss 04/13/2013   Alcohol abuse 04/13/2013   Annual physical exam 03/06/2012   Idiopathic scoliosis and kyphoscoliosis 08/18/2010   BACK PAIN, LUMBAR 07/21/2010   Anxiety state 04/01/2009   Essential hypertension, benign 01/04/2009   Insomnia 09/08/2008    PCP: Amon Aloysius BRAVO, MD   REFERRING PROVIDER: Tobie Tonita POUR, DO   REFERRING DIAG: R26.81 (ICD-10-CM) - Unsteady gait R41.89 (ICD-10-CM) - Cognitive changes  THERAPY DIAG:  Other abnormalities of gait and mobility  Muscle weakness (generalized)  Other low back pain  RATIONALE FOR EVALUATION AND TREATMENT: Rehabilitation  ONSET DATE: prrogressive over last 6 months  NEXT MD VISIT: after brain MRI   SUBJECTIVE:  SUBJECTIVE STATEMENT: Doing good today,   EVAL: 73 y/o male referred to PT from Dr Tobie for unsteady gait and falls.  States he has gotten progressively more unsteady over last 6 months for no apparent reason.  He had therapy here a year ago following a lumbar fusion and did quite well.  He states he really has very little back pain or trouble with his back now.   Balance is his chief c/o.    Neurology notes indicate that they feel his unsteadiness may be residual effects of the back surgery and residual canal stenosis.  They did not feel he has Parkinson's.   However, he is going to be worked up  more for his memory deficits and brain MRI is pending.  Patient reports Having difficulty walking up/down steps carrying things up to the second floor of his home and is generally unsteady going out places.  He has difficulty verbalizing his exact symptoms due to what seems to be cognitive issues  Pt accompanied by: self  PAIN: Are you having pain? Yes: NPRS scale: 0/10 now;  intermittent R lateral thigh pain Pain location: R lateral thigh Pain description: aching Aggravating factors: walking >10 min Relieving factors: standing, not walking  PERTINENT HISTORY:  L4/5 TLIF [place] 6/24; CLL, HTN, memory deficit  PRECAUTIONS: Fall  RED FLAGS: None  WEIGHT BEARING RESTRICTIONS: No  FALLS:  Has patient fallen in last 6 months? Yes. Number of falls 0  LIVING ENVIRONMENT: Lives with: lives with their family and lives with their spouse Lives in: House/apartment Stairs: Yes: Internal: 4 steps; can reach both and External: 1 flight steps; on right going up Has following equipment at home: walking stick when going fishing  OCCUPATION: still rents office space out;  owns a building  PLOF: Independent with gait  PATIENT GOALS: improve my balance    OBJECTIVE: (objective measures completed at initial evaluation unless otherwise dated)  DIAGNOSTIC FINDINGS:  Out-side paper records, electronic medical record, and images have been reviewed where available and summarized as:  MRI lumbar spine 02/10/2023: 1. At L4-5 there is a broad-based disc bulge. Severe bilateral facet arthropathy and ligamentum flavum infolding. Severe spinal stenosis. No left foraminal stenosis. Severe right foraminal stenosis. 2. At L5-S1 there is a broad-based disc osteophyte complex. Moderate bilateral facet arthropathy with a small right facet effusion. Severe left foraminal stenosis. Mild right foraminal stenosis. 3. At L3-4 there is a broad-based disc bulge. Moderate bilateral facet arthropathy. Mild  spinal stenosis. Moderate right and mild left foraminal stenosis. 4. No acute osseous injury of the lumbar spine.   MRI cervical spine 02/10/2023: 1. At T1-2 there is a minimal broad-based disc osteophyte complex. Moderate-severe bilateral foraminal stenosis. 2. No acute osseous injury of the thoracic spine. 3. Partially visualized cervical spine spondylosis. If there is further clinical concern, recommend a dedicated MRI of the cervical spine.  COGNITION: Overall cognitive status: Impaired   SENSATION: WFL  COORDINATION: Finger to nose is slightly impaired  EDEMA:  None noted  MUSCLE TONE: WNL  DTRs:  NT   POSTURE:  decreased lumbar lordosis, tends to slouch  MUSCLE LENGTH: Hamstrings: moderate tightness BLE ITB: slight tight R Piriformis: slight tight BLE Hip flexors: NT  LOWER EXTREMITY ROM:     Active  Right eval Left eval  Hip flexion    Hip extension    Hip abduction    Hip adduction    Hip internal rotation    Hip external rotation    Knee flexion  Knee extension    Ankle dorsiflexion    Ankle plantarflexion    Ankle inversion    Ankle eversion     (Blank rows = not tested)  LOWER EXTREMITY MMT:    MMT Right eval Left eval  Hip flexion    Hip extension    Hip abduction    Hip adduction    Hip internal rotation 4- 4  Hip external rotation 4+ 4  Knee flexion 5 5  Knee extension 5 5  Ankle dorsiflexion 4+ 4  Ankle plantarflexion    Ankle inversion    Ankle eversion    (Blank rows = not tested)  BED MOBILITY:  Independent in all aspects  TRANSFERS: Assistive device utilized: None  Sit to stand: SBA Stand to sit: Complete Independence Chair to chair: SBA Floor: NT  GAIT: Distance walked: 150' in clinic Assistive device utilized: None Level of assistance: CGA Gait pattern: staggering gait, frequent losses of balance but can correct or catch himself Comments:   STAIRS:  Level of Assistance: SBA  Stair Negotiation Technique:  Alternating Pattern  with Single Rail on Right  Number of Stairs: 12    Height of Stairs: 7  Comments: goes quickly to compensate for lack of balance  FUNCTIONAL TESTS:  FGA = 17 5X STS = 9.75 sec TUG = 7 sec Gait speed = 3.11 ft/sec  PATIENT SURVEYS:  ABC = 66.9%   TODAY'S TREATMENT:  07/15/24 Gait in hallway 6 laps 150 ft- correcting antalgic gait and decreased stride length Lateral step ups BLE x 20 Hip hikes on step 2x10 B Multifidus raises in quadruped 2x10 B Clock reaches R/L 12 to 6 o'clock Tandem stance + EC 30 sec B  07/11/24  Nustep L5x63min  NEUROMUSCULAR RE-EDUCATION: To improve balance, kinesthesia and proprioception. Resisted gait black TB 3 laps each direction Multifidus walkout Black TB x 10 each way Standing hip abduction x 10 RTB Standing hip extension 2x10 RTB Clock balance 5x R/L 12 to 6 o'clock  07/09/24  Nustep L5x1min  NEUROMUSCULAR RE-EDUCATION: To improve balance, kinesthesia and proprioception. Tandem gait F/B x 5 laps at mat table Braiding forward cross over x 3 laps counter top down/back Braiding backward cross over x 3 laps counter top down/back Retro and fwd gait along counter 3x w/o support Standing on airex:  Narrow BOS x 1'  Normal BOS EC x 30'  3 way Stepping floor x 10 B    07/02/24 THERAPEUTIC EXERCISE: To improve strength.  Demonstration, verbal and tactile cues throughout for technique. Bike L4 x 7'  HEP review/Corner balance: Toe raises x 20 BLE Heel raises x 20 BLE Minisquats x 20 BLE Tandem stance x 1' BLE SLS x 1' and 1 finger support BLE   NEUROMUSCULAR RE-EDUCATION: To improve balance. Long foam standing x 1' x 2 Long foam sidestepping with min to mod assist x 3 laps Tandem gait F/B x 5 laps at mat table Braiding forward cross over x 5 laps mat table Braiding backward cross over x 5 laps mat table Backward walking 1 lap around gym with variable min to mod assist   06/24/24 SELF CARE: Provided education on PT  POC progression and to improve safety with use of SPC for assistive device, recommended to use a cane out of home;  initial HEP    PATIENT EDUCATION:  Education details: PT eval findings, anticipated POC, and initial HEP  Person educated: Patient and Spouse Education method: Explanation, Demonstration, Verbal cues, Tactile cues, and MedBridge[employer]  app access provided Education comprehension: verbalized understanding, verbal cues required, tactile cues required, and needs further education  HOME EXERCISE PROGRAM: Access Code: [email or web address] URL: [email or web address] Date: 06/24/2024 Prepared by: Garnette Montclair  Exercises - Toe Raises with Unilateral Counter Support  - 1 x daily - 7 x weekly - 3 sets - 10 reps - Heel Raises with Counter Support  - 1 x daily - 7 x weekly - 3 sets - 10 reps - Mini Squat with Counter Support  - 1 x daily - 7 x weekly - 3 sets - 10 reps - Standing Tandem Balance with Counter Support  - 1 x daily - 7 x weekly - 1 sets - 1 reps - 1 min hold - Single Leg Stance  - 1 x daily - 7 x weekly - 1 sets - 1 reps - 1 min hold   ASSESSMENT:  CLINICAL IMPRESSION: Able to progress with hip strengthening more laterally and balance interventions. Pt gait showing an antalgic pattern, with less stability on R LE, waddling gait, needing many cues to improve step length and heel strike.   GER RINGENBERG is a 73 y.o. male who was referred to physical therapy for evaluation and treatment for unsteady gait and falls.  Patient presents with physical impairments of impaired activity tolerance, impaired standing balance, impaired ambulation, and decreased safety awareness impacting safe and independent functional mobility.  He covers for his poor overall balance by ambulating and transferring very quickly.   However, when he is asked to walk and do activities more slowly with better control, he looses balance much more easily.   Timed tests do not show balance deficits as  follows:  Gait speed 3.11 ft/sec, (2.62 ft/sec is needed for community access),  TUG of 7 sec (>13.5 sec indicates increased risk for falls), and 5xSTS of 9.75 sec (>15 sec indicates increased risk for falls and decreased BLE power).  However, his FGA score is 19/30 indicating a definite fall risk.   ABC scale score of 66.9% indicates a moderate level of physical functioning, but I think he overestimates his abilities and/or has decreased insight into his actual deficits.  HE is advised to use at least a  cane for safety.    Jaxxon Naeem will benefit from skilled PT to address above deficits to improve mobility and activity tolerance to help reach the maximal level of functional independence and mobility. Patient demonstrates understanding of this POC and is in agreement with this plan.   OBJECTIVE IMPAIRMENTS: Abnormal gait, difficulty walking, decreased safety awareness, and impaired perceived functional ability.   ACTIVITY LIMITATIONS: carrying, bending, stairs, and locomotion level  PARTICIPATION LIMITATIONS: laundry, shopping, and community activity  PERSONAL FACTORS: Age, Time since onset of injury/illness/exacerbation, and 1-2 comorbidities:   L4/5 TLIF [facility] 6/24; CLL, HTN, memory deficit are also affecting patient's functional outcome.   REHAB POTENTIAL: Good  CLINICAL DECISION MAKING: Evolving/moderate complexity  EVALUATION COMPLEXITY: Moderate   GOALS: Goals reviewed with patient? Yes  SHORT TERM GOALS: Target date: 07/23/2024   Patient will be independent with initial HEP to improve outcomes and carryover.  Baseline: PT assist required Goal status: IN PROGRESS- 07/15/24   2.  Patient will be educated on strategies to decrease risk of falls.  Baseline: initial education provided (needs sheet) Goal status: INITIAL  LONG TERM GOALS: Target date: 08/20/2024   Patient will be independent with advanced/ongoing HEP to facilitate ability to maintain/progress functional gains  from skilled physical therapy services. Baseline:no advanced HEP yet Goal  status: INITIAL  3.  Patient will be able to step up/down curb safely with LRAD for safety with community ambulation.  Baseline: TBD Goal status: INITIAL   4.  Patient will demonstrate improved BLE strength to >/= 5/5 for improved stability and ease of mobility . Baseline: Refer to above LE MMT table Goal status: INITIAL  7.  Patient will improve Berg score to >/= 50/56 to improve safety and stability with ADLs in standing and reduce risk for falls. (MCID= 8 points)  Baseline: TBD Goal status: INITIAL  9. Patient will improve FGA score to at least  24/30 to improve gait stability and reduce risk for falls. Baseline: 19 Goal status: INITIAL  10.  Patient will report >/= 86% on ABC scale (MCID = 19%) to demonstrate improved balance confidence with functional mobility and gait. Baseline: 66 Goal status: INITIAL   PLAN:  PT FREQUENCY: 1-2x/week  PT DURATION: 8 weeks  PLANNED INTERVENTIONS: 97164- PT Re-evaluation, 97750- Physical Performance Testing, 97110-Therapeutic exercises, 97530- Therapeutic activity, [id number]- Neuromuscular re-education, 97535- Self Care, [id number]- Manual therapy, [id number]- Gait training, [phone]- Electrical stimulation (unattended), 97016- Vasopneumatic device, [id number]- Ultrasound, [id number]- Traction (mechanical), 20560 (1-2 muscles), 20561 (3+ muscles)- Dry Needling, Patient/Family education, Balance training, Stair training, Taping, Joint mobilization, Cryotherapy, and Moist heat  PLAN FOR NEXT SESSION: progress with dynamic balance activities   Sol LITTIE Gaskins, PTA 07/15/2024, 11:12 AM

## 2024-07-16 ENCOUNTER — Ambulatory Visit: Payer: Self-pay | Admitting: Internal Medicine

## 2024-07-16 NOTE — Telephone Encounter (Signed)
 Result back. Please advise?

## 2024-07-16 NOTE — Telephone Encounter (Signed)
Will call patient tomorrow with results

## 2024-07-17 ENCOUNTER — Ambulatory Visit

## 2024-07-17 DIAGNOSIS — M6281 Muscle weakness (generalized): Secondary | ICD-10-CM

## 2024-07-17 DIAGNOSIS — R2689 Other abnormalities of gait and mobility: Secondary | ICD-10-CM

## 2024-07-17 DIAGNOSIS — M5459 Other low back pain: Secondary | ICD-10-CM

## 2024-07-17 DIAGNOSIS — R2681 Unsteadiness on feet: Secondary | ICD-10-CM | POA: Diagnosis not present

## 2024-07-17 DIAGNOSIS — R4189 Other symptoms and signs involving cognitive functions and awareness: Secondary | ICD-10-CM | POA: Diagnosis not present

## 2024-07-17 NOTE — Telephone Encounter (Signed)
 Spoke w/ Pt- made him aware of results and recommendations. States he would like to speak w/ PCP directly regarding report and having frequent testing on liver. Informed I'd send a message to see if PCP is able to call or if he wants us  to have an appt scheduled. Pt verbalized understanding.
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
## 2024-07-17 NOTE — Telephone Encounter (Signed)
 LMOM asking for call back. Okay for E2C2 to discuss.

## 2024-07-17 NOTE — Telephone Encounter (Signed)
 Advise pt: US  showed fatty liver a common condition. Treatment is ETOH abstinence and loose wt . Also has liver cysts, benign, no need for f/u imagine

## 2024-07-18 NOTE — Telephone Encounter (Signed)
 We can discuss in detail when he comes back. There is no need for more imagine  at this time. he can schedule a visit for a more detailed discussion if so desired

## 2024-07-18 NOTE — Telephone Encounter (Signed)
 Spoke w/ Pt- informed PCP recommended to keep appt for 08/27/24 can discuss more at that time. Pt verbalized understanding.

## 2024-07-22 ENCOUNTER — Telehealth: Payer: Self-pay | Admitting: Rehabilitation

## 2024-07-22 ENCOUNTER — Ambulatory Visit: Attending: Neurology | Admitting: Rehabilitation

## 2024-07-22 DIAGNOSIS — R2689 Other abnormalities of gait and mobility: Secondary | ICD-10-CM | POA: Insufficient documentation

## 2024-07-22 DIAGNOSIS — M48062 Spinal stenosis, lumbar region with neurogenic claudication: Secondary | ICD-10-CM | POA: Insufficient documentation

## 2024-07-22 DIAGNOSIS — M5459 Other low back pain: Secondary | ICD-10-CM | POA: Insufficient documentation

## 2024-07-22 DIAGNOSIS — M6281 Muscle weakness (generalized): Secondary | ICD-10-CM | POA: Insufficient documentation
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
## 2024-07-23 ENCOUNTER — Ambulatory Visit: Admitting: Rehabilitation

## 2024-07-23 DIAGNOSIS — M6281 Muscle weakness (generalized): Secondary | ICD-10-CM

## 2024-07-23 DIAGNOSIS — M5459 Other low back pain: Secondary | ICD-10-CM

## 2024-07-23 DIAGNOSIS — R2689 Other abnormalities of gait and mobility: Secondary | ICD-10-CM | POA: Diagnosis not present

## 2024-07-23 DIAGNOSIS — M48062 Spinal stenosis, lumbar region with neurogenic claudication: Secondary | ICD-10-CM

## 2024-07-23 NOTE — Therapy (Signed)
 OUTPATIENT PHYSICAL THERAPY NEURO TREATMENT   Patient Name: Bobby [name] MRN: [id number] DOB:1951/05/21, 73 y.o., male Today's Date: 07/23/2024   END OF SESSION:  PT End of Session - 07/23/24 0930     Visit Number 7    PT Start Time 0927    PT Stop Time 1013    PT Time Calculation (min) 46 min    Activity Tolerance Patient tolerated treatment well;No increased pain    Behavior During Therapy [facility] for tasks assessed/performed            Past Medical History:  Diagnosis Date   Anxiety and depression    Hypertension    Hyponatremia 03/2013   ongoing, seeing Dr. Tobie   Insomnia    Melanoma in situ of back [facility])    biopsy on 03/05/2020   SIADH (syndrome of inappropriate ADH production) (HCC) 2017   Dr. Tobie   Squamous cell carcinoma in situ    Past Surgical History:  Procedure Laterality Date   BRONCHIAL BIOPSY  11/27/2022   Procedure: BRONCHIAL BIOPSIES;  Surgeon: Shelah Lamar RAMAN, MD;  Location: [facility] ENDOSCOPY;  Service: Cardiopulmonary;;   BRONCHIAL BRUSHINGS  11/27/2022   Procedure: BRONCHIAL BRUSHINGS;  Surgeon: Shelah Lamar RAMAN, MD;  Location: [facility] ENDOSCOPY;  Service: Cardiopulmonary;;   BRONCHIAL WASHINGS  11/27/2022   Procedure: BRONCHIAL WASHINGS;  Surgeon: Shelah Lamar RAMAN, MD;  Location: [name];  Service: Cardiopulmonary;;   MOHS SURGERY  ~ 04/2020   face, [facility]   SHOULDER SURGERY  1980s   right shoulder   TRANSFORAMINAL LUMBAR INTERBODY FUSION (TLIF) WITH PEDICLE SCREW FIXATION 1 LEVEL Left 05/17/2023   Procedure: LEFT-SIDED LUMBAR 4- LUMBAR 5 TRANSFORAMINAL LUMBAR INTERBODY FUSION AND DECOMPRESSION WITH INSTRUMENTATION AND ALLOGRAFT;  Surgeon: Beuford Anes, MD;  Location: [facility] OR;  Service: Orthopedics;  Laterality: Left;   VIDEO BRONCHOSCOPY N/A 11/27/2022   Procedure: VIDEO BRONCHOSCOPY WITH FLUORO;  Surgeon: Shelah Lamar RAMAN, MD;  Location: [facility] ENDOSCOPY;  Service: Cardiopulmonary;  Laterality: N/A;   Patient Active Problem List   Diagnosis Date Noted    Radiculopathy of lumbar region 05/17/2023   CLL (chronic lymphocytic leukemia) (HCC) 02/21/2023   Abnormal CT of the chest 11/01/2022   Melanoma in situ of back (HCC) 03/16/2020   Squamous cell carcinoma in situ 08/21/2019   Vitamin D  deficiency 05/29/2019   PCP NOTES >>>> 09/28/2015   Hypogonadism in male 05/13/2015   SIADH (syndrome of inappropriate ADH production) (HCC) 04/13/2013   MCI (mild cognitive impairment) with memory loss 04/13/2013   Alcohol abuse 04/13/2013   Annual physical exam 03/06/2012   Idiopathic scoliosis and kyphoscoliosis 08/18/2010   BACK PAIN, LUMBAR 07/21/2010   Anxiety state 04/01/2009   Essential hypertension, benign 01/04/2009   Insomnia 09/08/2008    PCP: Amon Aloysius BRAVO, MD   REFERRING PROVIDER: Tobie Tonita POUR, DO   REFERRING DIAG: R26.81 (ICD-10-CM) - Unsteady gait R41.89 (ICD-10-CM) - Cognitive changes  THERAPY DIAG:  Other abnormalities of gait and mobility  Muscle weakness (generalized)  Other low back pain  Spinal stenosis of lumbar region with neurogenic claudication  RATIONALE FOR EVALUATION AND TREATMENT: Rehabilitation  ONSET DATE: prrogressive over last 6 months  NEXT MD VISIT: after brain MRI   SUBJECTIVE:  SUBJECTIVE STATEMENT: States feels ok.  Reports he is sitting with his legs crossed on the ground cutting a crab apple tree with his chainsaw and is having difficulty getting up from the ground.   He is advised that we recommend he not operate a chainsaw considering his balance impairements and medical problems.   However, he wants to work on getting up from the floor since he is having a lot of difficulty with this.  EVAL: 73 y/o male referred to PT from Dr Tobie for unsteady gait and falls.  States he has gotten progressively more  unsteady over last 6 months for no apparent reason.  He had therapy here a year ago following a lumbar fusion and did quite well.  He states he really has very little back pain or trouble with his back now.   Balance is his chief c/o.    Neurology notes indicate that they feel his unsteadiness may be residual effects of the back surgery and residual canal stenosis.  They did not feel he has Parkinson's.   However, he is going to be worked up more for his memory deficits and brain MRI is pending.  Patient reports Having difficulty walking up/down steps carrying things up to the second floor of his home and is generally unsteady going out places.  He has difficulty verbalizing his exact symptoms due to what seems to be cognitive issues  Pt accompanied by: self  PAIN: Are you having pain? Yes: NPRS scale: 0/10 now;  intermittent R lateral thigh pain Pain location: R lateral thigh Pain description: aching Aggravating factors: walking >10 min Relieving factors: standing, not walking  PERTINENT HISTORY:  L4/5 TLIF [place] 6/24; CLL, HTN, memory deficit  PRECAUTIONS: Fall  RED FLAGS: None  WEIGHT BEARING RESTRICTIONS: No  FALLS:  Has patient fallen in last 6 months? Yes. Number of falls 0  LIVING ENVIRONMENT: Lives with: lives with their family and lives with their spouse Lives in: House/apartment Stairs: Yes: Internal: 4 steps; can reach both and External: 1 flight steps; on right going up Has following equipment at home: walking stick when going fishing  OCCUPATION: still rents office space out;  owns a building  PLOF: Independent with gait  PATIENT GOALS: improve my balance    OBJECTIVE: (objective measures completed at initial evaluation unless otherwise dated)  DIAGNOSTIC FINDINGS:  Out-side paper records, electronic medical record, and images have been reviewed where available and summarized as:  MRI lumbar spine 02/10/2023: 1. At L4-5 there is a broad-based disc bulge.  Severe bilateral facet arthropathy and ligamentum flavum infolding. Severe spinal stenosis. No left foraminal stenosis. Severe right foraminal stenosis. 2. At L5-S1 there is a broad-based disc osteophyte complex. Moderate bilateral facet arthropathy with a small right facet effusion. Severe left foraminal stenosis. Mild right foraminal stenosis. 3. At L3-4 there is a broad-based disc bulge. Moderate bilateral facet arthropathy. Mild spinal stenosis. Moderate right and mild left foraminal stenosis. 4. No acute osseous injury of the lumbar spine.   MRI cervical spine 02/10/2023: 1. At T1-2 there is a minimal broad-based disc osteophyte complex. Moderate-severe bilateral foraminal stenosis. 2. No acute osseous injury of the thoracic spine. 3. Partially visualized cervical spine spondylosis. If there is further clinical concern, recommend a dedicated MRI of the cervical spine.  COGNITION: Overall cognitive status: Impaired   SENSATION: WFL  COORDINATION: Finger to nose is slightly impaired  EDEMA:  None noted  MUSCLE TONE: WNL  DTRs:  NT   POSTURE:  decreased lumbar  lordosis, tends to slouch  MUSCLE LENGTH: Hamstrings: moderate tightness BLE ITB: slight tight R Piriformis: slight tight BLE Hip flexors: NT  LOWER EXTREMITY ROM:     Active  Right eval Left eval  Hip flexion    Hip extension    Hip abduction    Hip adduction    Hip internal rotation    Hip external rotation    Knee flexion    Knee extension    Ankle dorsiflexion    Ankle plantarflexion    Ankle inversion    Ankle eversion     (Blank rows = not tested)  LOWER EXTREMITY MMT:    MMT Right eval Left eval  Hip flexion    Hip extension    Hip abduction    Hip adduction    Hip internal rotation 4- 4  Hip external rotation 4+ 4  Knee flexion 5 5  Knee extension 5 5  Ankle dorsiflexion 4+ 4  Ankle plantarflexion    Ankle inversion    Ankle eversion    (Blank rows = not tested)  BED  MOBILITY:  Independent in all aspects  TRANSFERS: Assistive device utilized: None  Sit to stand: SBA Stand to sit: Complete Independence Chair to chair: SBA Floor: NT  GAIT: Distance walked: 150' in clinic Assistive device utilized: None Level of assistance: CGA Gait pattern: staggering gait, frequent losses of balance but can correct or catch himself Comments:   STAIRS:  Level of Assistance: SBA  Stair Negotiation Technique: Alternating Pattern  with Single Rail on Right  Number of Stairs: 12    Height of Stairs: 7  Comments: goes quickly to compensate for lack of balance  FUNCTIONAL TESTS:  FGA = 17 5X STS = 9.75 sec TUG = 7 sec Gait speed = 3.11 ft/sec  PATIENT SURVEYS:  ABC = 66.9%   TODAY'S TREATMENT:  07/23/24 Bike L4 x 8'  NEUROMUSCULAR RE-EDUCATION: To improve balance. Resisted walking RTB F/B, S/S, B/F x 3 laps at counter each Toe/heel rocking x 100 BLE Counter support lunges x 10 BLE Lunge to a 1/2 kneel position on foam (2 pieces) x 2/5 BLE Step ups on 8 with minimal 1UE support x 10 BLE and SBA for cueing for safety and balance Clock cone touches  BLE from 3:00 -9:00 x 5 laps  THERAPEUTIC ACTIVITIES: To improve functional performance.  Demonstration, verbal and tactile cues throughout for technique. Seated knee extension 20# x 2/10 BLE Floor transfers to mat table x 4 trials with CGA and v/c for proper progression from side sit to quadruped to tall kneel, to half kneel and pulling up to table   07/17/24 Nustep L5x16min Education on fall risk prevention Fwd and retro tandem gait Braiding along counter 3x Standing on airex:  Toe taps fwd x 10  Toe taps lateral x 10   Toe taps Post x 10 Marching from airex x 10 R/L SLS on floor with UE support x 30  07/15/24 Gait in hallway 6 laps 150 ft- correcting antalgic gait and decreased stride length Lateral step ups BLE x 20 Hip hikes on step 2x10 B Multifidus raises in quadruped 2x10 B Clock reaches  R/L 12 to 6 o'clock Tandem stance + EC 30 sec B  07/11/24  Nustep L5x7min  NEUROMUSCULAR RE-EDUCATION: To improve balance, kinesthesia and proprioception. Resisted gait black TB 3 laps each direction Multifidus walkout Black TB x 10 each way Standing hip abduction x 10 RTB Standing hip extension 2x10 RTB Clock balance 5x  R/L 12 to 6 o'clock  07/09/24  Nustep L5x34min  NEUROMUSCULAR RE-EDUCATION: To improve balance, kinesthesia and proprioception. Tandem gait F/B x 5 laps at mat table Braiding forward cross over x 3 laps counter top down/back Braiding backward cross over x 3 laps counter top down/back Retro and fwd gait along counter 3x w/o support Standing on airex:  Narrow BOS x 1'  Normal BOS EC x 30'  3 way Stepping floor x 10 B    PATIENT EDUCATION:  Education details: proper floor transfer technique, adding lunges to HEP  Person educated: Patient and Spouse Education method: Explanation, Demonstration, Verbal cues, Tactile cues, and MedBridgeGO app access provided Education comprehension: verbalized understanding, verbal cues required, tactile cues required, and needs further education  HOME EXERCISE PROGRAM: Access Code: [email or web address] URL: [email or web address] Date: 07/17/2024 Prepared by: Braylin Clark  Exercises - Toe Raises with Unilateral Counter Support  - 1 x daily - 7 x weekly - 3 sets - 10 reps - Heel Raises with Counter Support  - 1 x daily - 7 x weekly - 3 sets - 10 reps - Mini Squat with Counter Support  - 1 x daily - 7 x weekly - 3 sets - 10 reps - Standing Tandem Balance with Counter Support  - 1 x daily - 7 x weekly - 1 sets - 1 reps - 1 min hold - Single Leg Stance  - 1 x daily - 7 x weekly - 1 sets - 1 reps - 1 min hold - Carioca with Counter Support  - 1 x daily - 7 x weekly - 3 sets - 10 reps - Tandem Walking with Counter Support  - 1 x daily - 7 x weekly - 3 sets - 10 reps - Backward Tandem Walking with Counter Support  - 1 x daily - 7  x weekly - 3 sets - 10 reps  Patient Education - What You Can Do to Prevent Falls   ASSESSMENT:  CLINICAL IMPRESSION: Patient is able to practice floor transfers today revealing quad/hip extensor weakness L>R.   Needs to work on lunges to improve this.  He lacks good balance from 1/2 kneel to stand as well and needs further therapy for this issue.   He is progressing and reports no falls.  PT remains necessary for floor transfer, strength, balance deficits.  Continue per POC  Bobby [name] is a 73 y.o. male who was referred to physical therapy for evaluation and treatment for unsteady gait and falls.  Patient presents with physical impairments of impaired activity tolerance, impaired standing balance, impaired ambulation, and decreased safety awareness impacting safe and independent functional mobility.  He covers for his poor overall balance by ambulating and transferring very quickly.   However, when he is asked to walk and do activities more slowly with better control, he looses balance much more easily.   Timed tests do not show balance deficits as follows:  Gait speed 3.11 ft/sec, (2.62 ft/sec is needed for community access),  TUG of 7 sec (>13.5 sec indicates increased risk for falls), and 5xSTS of 9.75 sec (>15 sec indicates increased risk for falls and decreased BLE power).  However, his FGA score is 19/30 indicating a definite fall risk.   ABC scale score of 66.9% indicates a moderate level of physical functioning, but I think he overestimates his abilities and/or has decreased insight into his actual deficits.  HE is advised to use at least a  cane for safety.    Elsie Peter  will benefit from skilled PT to address above deficits to improve mobility and activity tolerance to help reach the maximal level of functional independence and mobility. Patient demonstrates understanding of this POC and is in agreement with this plan.   OBJECTIVE IMPAIRMENTS: Abnormal gait, difficulty walking,  decreased safety awareness, and impaired perceived functional ability.   ACTIVITY LIMITATIONS: carrying, bending, stairs, and locomotion level  PARTICIPATION LIMITATIONS: laundry, shopping, and community activity  PERSONAL FACTORS: Age, Time since onset of injury/illness/exacerbation, and 1-2 comorbidities:   L4/5 TLIF [facility] 6/24; CLL, HTN, memory deficit are also affecting patient's functional outcome.   REHAB POTENTIAL: Good  CLINICAL DECISION MAKING: Evolving/moderate complexity  EVALUATION COMPLEXITY: Moderate   GOALS: Goals reviewed with patient? Yes  SHORT TERM GOALS: Target date: 07/23/2024   Patient will be independent with initial HEP to improve outcomes and carryover.  Baseline: PT assist required Goal status: IN PROGRESS- 07/15/24   2.  Patient will be educated on strategies to decrease risk of falls.  Baseline: initial education provided (needs sheet) Goal status: MET- 07/17/24  LONG TERM GOALS: Target date: 08/20/2024   Patient will be independent with advanced/ongoing HEP to facilitate ability to maintain/progress functional gains from skilled physical therapy services. Baseline:no advanced HEP yet 9/2:  progressing, but needs help with recall due to forgetfulness Goal status: IN PROGRESS  3.  Patient will be able to step up/down curb safely with LRAD for safety with community ambulation.  Baseline: TBD Goal status: INITIAL   4.  Patient will demonstrate improved BLE strength to >/= 5/5 for improved stability and ease of mobility . Baseline: Refer to above LE MMT table Goal status: INITIAL  7.  Patient will improve Berg score to >/= 50/56 to improve safety and stability with ADLs in standing and reduce risk for falls. (MCID= 8 points)  Baseline: TBD Goal status: INITIAL  9. Patient will improve FGA score to at least  24/30 to improve gait stability and reduce risk for falls. Baseline: 19 Goal status: INITIAL  10.  Patient will report >/= 86% on ABC  scale (MCID = 19%) to demonstrate improved balance confidence with functional mobility and gait. Baseline: 66 Goal status: INITIAL   PLAN:  PT FREQUENCY: 1-2x/week  PT DURATION: 8 weeks  PLANNED INTERVENTIONS: 97164- PT Re-evaluation, 97750- Physical Performance Testing, 97110-Therapeutic exercises, 97530- Therapeutic activity, [id number]- Neuromuscular re-education, 97535- Self Care, [id number]- Manual therapy, [id number]- Gait training, [phone]- Electrical stimulation (unattended), 97016- Vasopneumatic device, [id number]- Ultrasound, [id number]- Traction (mechanical), 20560 (1-2 muscles), 20561 (3+ muscles)- Dry Needling, Patient/Family education, Balance training, Stair training, Taping, Joint mobilization, Cryotherapy, and Moist heat  PLAN FOR NEXT SESSION:  do BERG, ABC, strength recheck  Keymoni Mccaster, PT 07/23/2024, 4:58 PM

## 2024-07-24 ENCOUNTER — Telehealth: Payer: Self-pay | Admitting: Neurology

## 2024-07-24 ENCOUNTER — Ambulatory Visit: Admitting: Neurology

## 2024-07-24 DIAGNOSIS — R2681 Unsteadiness on feet: Secondary | ICD-10-CM | POA: Diagnosis not present

## 2024-07-24 DIAGNOSIS — M5417 Radiculopathy, lumbosacral region: Secondary | ICD-10-CM

## 2024-07-24 NOTE — Procedures (Unsigned)
 [facility] Neurology  [address] [place], [address]  [place], [place] [address] Tel: [phone] Fax: [phone] Test Date:  07/24/2024  Patient: Bobby [name] DOB: 08/16/1951 Physician: Tonita Blanch, DO  Sex: Male Height: 6' 0 Ref Phys: Tonita Blanch, DO  ID#: [id number]   Technician:    History: This is a 73 year old man referred for evaluation of gait unsteadiness.  NCV & EMG Findings: Extensive electrodiagnostic testing of the right upper extremity and additional studies of the left shows: Bilateral sural and superficial peroneal sensory responses are within normal limits. Bilateral peroneal motor responses are within normal limits.  Bilateral tibial motor responses show reduced amplitude (R2.6, L3.6 mV). Bilateral tibial H reflex studies shows prolonged latency. Chronic motor axon loss changes are seen affecting the L5-S1 myotomes bilaterally, without accompanying active denervation.  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 There is no evidence of a large fiber sensorimotor polyneuropathy affecting the lower extremities.   ___________________________ Tonita Blanch, DO    Nerve Conduction Studies   Stim Site NR Peak (ms) Norm Peak (ms) O-P Amp (V) Norm O-P Amp  Left Sup Peroneal Anti Sensory (Ant Lat Mall)  32 C  12 cm    2.0 <4.6 5.2 >3  Right Sup Peroneal Anti Sensory (Ant Lat Mall)  32 C  12 cm    2.8 <4.6 5.6 >3  Left Sural Anti Sensory (Lat Mall)  32 C  Calf    2.8 <4.6 9.1 >3  Right Sural Anti Sensory (Lat Mall)  32 C  Calf    3.0 <4.6 8.4 >3     Stim Site NR Onset (ms) Norm Onset (ms) O-P Amp (mV) Norm O-P Amp Site1 Site2 Delta-0 (ms) Dist (cm) Vel (m/s) Norm Vel (m/s)  Left Peroneal Motor (Ext Dig Brev)  32 C  Ankle    3.4 <6.0 2.8 >2.5 B Fib Ankle 8.7 40.0 46 >40  B Fib    12.1  2.3  Poplt B Fib 1.7 8.0 47 >40  Poplt    13.8  2.2         Right Peroneal Motor (Ext Dig Brev)  32 C  Ankle    3.9  <6.0 3.3 >2.5 B Fib Ankle 9.1 42.0 46 >40  B Fib    13.0  2.7  Poplt B Fib 1.8 8.0 44 >40  Poplt    14.8  2.5         Left Tibial Motor (Abd Hall Brev)  32 C  Ankle    3.8 <6.0 *2.6 >4 Knee Ankle 9.6 44.0 46 >40  Knee    13.4  2.2         Right Tibial Motor (Abd Hall Brev)  32 C  Ankle    5.0 <6.0 *3.6 >4 Knee Ankle 9.3 48.0 52 >40  Knee    14.3  2.5          Electromyography   Side Muscle Ins.Act Fibs Fasc Recrt Amp Dur Poly Activation Comment  Right AntTibialis Nml Nml Nml *1- *1+ *1+ *1+ Nml N/A  Right Gastroc Nml Nml Nml *1- *1+ *1+ *1+ Nml N/A  Right Flex Dig Long Nml Nml Nml *1- *1+ *1+ *1+ Nml N/A  Right RectFemoris Nml Nml Nml Nml Nml Nml Nml Nml N/A  Right BicepsFemS Nml Nml Nml *1- *1+ *1+ *1+ Nml N/A  Right GluteusMed Nml Nml Nml *1- *1+ *1+ *1+ Nml N/A  Left AntTibialis Nml Nml Nml *2- *1+ *1+ *1+  Nml N/A  Left Gastroc Nml Nml Nml *1- *1+ *1+ *1+ Nml N/A  Left Flex Dig Long Nml Nml Nml *2- *1+ *1+ *1+ Nml N/A  Left RectFemoris Nml Nml Nml Nml Nml Nml Nml Nml N/A  Left GluteusMed Nml Nml Nml *1- *1+ *1+ *1+ Nml N/A  Left BicepsFemS Nml Nml Nml *1- *1+ *1+ *1+ Nml N/A      Waveforms:

## 2024-07-24 NOTE — Telephone Encounter (Signed)
 Pt.s wife would like call back on results for MRI Brain scan and you can call her for EMG results as well

## 2024-07-28 DIAGNOSIS — L570 Actinic keratosis: Secondary | ICD-10-CM | POA: Diagnosis not present

## 2024-07-30 ENCOUNTER — Ambulatory Visit

## 2024-08-05 ENCOUNTER — Encounter

## 2024-08-06 ENCOUNTER — Ambulatory Visit

## 2024-08-06 ENCOUNTER — Other Ambulatory Visit (HOSPITAL_BASED_OUTPATIENT_CLINIC_OR_DEPARTMENT_OTHER): Payer: Self-pay

## 2024-08-06 DIAGNOSIS — R2689 Other abnormalities of gait and mobility: Secondary | ICD-10-CM

## 2024-08-06 DIAGNOSIS — M5459 Other low back pain: Secondary | ICD-10-CM

## 2024-08-06 DIAGNOSIS — M6281 Muscle weakness (generalized): Secondary | ICD-10-CM | POA: Diagnosis not present

## 2024-08-06 DIAGNOSIS — M48062 Spinal stenosis, lumbar region with neurogenic claudication: Secondary | ICD-10-CM | POA: Diagnosis not present

## 2024-08-06 MED ORDER — FLUZONE HIGH-DOSE 0.5 ML IM SUSY
0.5000 mL | PREFILLED_SYRINGE | Freq: Once | INTRAMUSCULAR | 0 refills | Status: AC
  Filled 2024-08-06: qty 0.5, 1d supply, fill #0

## 2024-08-06 MED ORDER — COMIRNATY 30 MCG/0.3ML IM SUSY
0.3000 mL | PREFILLED_SYRINGE | Freq: Once | INTRAMUSCULAR | 0 refills | Status: AC
  Filled 2024-08-06: qty 0.3, 1d supply, fill #0

## 2024-08-06 NOTE — Therapy (Signed)
 OUTPATIENT PHYSICAL THERAPY NEURO TREATMENT   Patient Name: Bobby [name] MRN: [id number] DOB:28-Aug-1951, 73 y.o., male Today's Date: 08/06/2024   END OF SESSION:  PT End of Session - 08/06/24 1519     Visit Number 8    Authorization Type aetna medicare    PT Start Time 1445    PT Stop Time 1530    PT Time Calculation (min) 45 min    Activity Tolerance Patient tolerated treatment well;No increased pain    Behavior During Therapy [facility] for tasks assessed/performed             Past Medical History:  Diagnosis Date   Anxiety and depression    Hypertension    Hyponatremia 03/2013   ongoing, seeing Dr. Tobie   Insomnia    Melanoma in situ of back [facility])    biopsy on 03/05/2020   SIADH (syndrome of inappropriate ADH production) (HCC) 2017   Dr. Tobie   Squamous cell carcinoma in situ    Past Surgical History:  Procedure Laterality Date   BRONCHIAL BIOPSY  11/27/2022   Procedure: BRONCHIAL BIOPSIES;  Surgeon: Shelah Lamar RAMAN, MD;  Location: [facility] ENDOSCOPY;  Service: Cardiopulmonary;;   BRONCHIAL BRUSHINGS  11/27/2022   Procedure: BRONCHIAL BRUSHINGS;  Surgeon: Shelah Lamar RAMAN, MD;  Location: [facility] ENDOSCOPY;  Service: Cardiopulmonary;;   BRONCHIAL WASHINGS  11/27/2022   Procedure: BRONCHIAL WASHINGS;  Surgeon: Shelah Lamar RAMAN, MD;  Location: [name];  Service: Cardiopulmonary;;   MOHS SURGERY  ~ 04/2020   face, [facility]   SHOULDER SURGERY  1980s   right shoulder   TRANSFORAMINAL LUMBAR INTERBODY FUSION (TLIF) WITH PEDICLE SCREW FIXATION 1 LEVEL Left 05/17/2023   Procedure: LEFT-SIDED LUMBAR 4- LUMBAR 5 TRANSFORAMINAL LUMBAR INTERBODY FUSION AND DECOMPRESSION WITH INSTRUMENTATION AND ALLOGRAFT;  Surgeon: Beuford Anes, MD;  Location: [facility] OR;  Service: Orthopedics;  Laterality: Left;   VIDEO BRONCHOSCOPY N/A 11/27/2022   Procedure: VIDEO BRONCHOSCOPY WITH FLUORO;  Surgeon: Shelah Lamar RAMAN, MD;  Location: [facility] ENDOSCOPY;  Service: Cardiopulmonary;  Laterality: N/A;   Patient Active  Problem List   Diagnosis Date Noted   Radiculopathy of lumbar region 05/17/2023   CLL (chronic lymphocytic leukemia) (HCC) 02/21/2023   Abnormal CT of the chest 11/01/2022   Melanoma in situ of back (HCC) 03/16/2020   Squamous cell carcinoma in situ 08/21/2019   Vitamin D  deficiency 05/29/2019   PCP NOTES >>>> 09/28/2015   Hypogonadism in male 05/13/2015   SIADH (syndrome of inappropriate ADH production) (HCC) 04/13/2013   MCI (mild cognitive impairment) with memory loss 04/13/2013   Alcohol abuse 04/13/2013   Annual physical exam 03/06/2012   Idiopathic scoliosis and kyphoscoliosis 08/18/2010   BACK PAIN, LUMBAR 07/21/2010   Anxiety state 04/01/2009   Essential hypertension, benign 01/04/2009   Insomnia 09/08/2008    PCP: Amon Aloysius BRAVO, MD   REFERRING PROVIDER: Tobie Tonita POUR, DO   REFERRING DIAG: R26.81 (ICD-10-CM) - Unsteady gait R41.89 (ICD-10-CM) - Cognitive changes  THERAPY DIAG:  Other abnormalities of gait and mobility  Muscle weakness (generalized)  Other low back pain  RATIONALE FOR EVALUATION AND TREATMENT: Rehabilitation  ONSET DATE: prrogressive over last 6 months  NEXT MD VISIT: after brain MRI   SUBJECTIVE:  SUBJECTIVE STATEMENT: Pt reports he walked too much the other day, had pain in L posterior hip, he took 1/2 of a tylenol  which helped  EVAL: 73 y/o male referred to PT from Dr Tobie for unsteady gait and falls.  States he has gotten progressively more unsteady over last 6 months for no apparent reason.  He had therapy here a year ago following a lumbar fusion and did quite well.  He states he really has very little back pain or trouble with his back now.   Balance is his chief c/o.    Neurology notes indicate that they feel his unsteadiness may be residual  effects of the back surgery and residual canal stenosis.  They did not feel he has Parkinson's.   However, he is going to be worked up more for his memory deficits and brain MRI is pending.  Patient reports Having difficulty walking up/down steps carrying things up to the second floor of his home and is generally unsteady going out places.  He has difficulty verbalizing his exact symptoms due to what seems to be cognitive issues  Pt accompanied by: self  PAIN: Are you having pain? Yes: NPRS scale: 0/10 now;  intermittent R lateral thigh pain Pain location: R lateral thigh Pain description: aching Aggravating factors: walking >10 min Relieving factors: standing, not walking  PERTINENT HISTORY:  L4/5 TLIF [place] 6/24; CLL, HTN, memory deficit  PRECAUTIONS: Fall  RED FLAGS: None  WEIGHT BEARING RESTRICTIONS: No  FALLS:  Has patient fallen in last 6 months? Yes. Number of falls 0  LIVING ENVIRONMENT: Lives with: lives with their family and lives with their spouse Lives in: House/apartment Stairs: Yes: Internal: 4 steps; can reach both and External: 1 flight steps; on right going up Has following equipment at home: walking stick when going fishing  OCCUPATION: still rents office space out;  owns a building  PLOF: Independent with gait  PATIENT GOALS: improve my balance    OBJECTIVE: (objective measures completed at initial evaluation unless otherwise dated)  DIAGNOSTIC FINDINGS:  Out-side paper records, electronic medical record, and images have been reviewed where available and summarized as:  MRI lumbar spine 02/10/2023: 1. At L4-5 there is a broad-based disc bulge. Severe bilateral facet arthropathy and ligamentum flavum infolding. Severe spinal stenosis. No left foraminal stenosis. Severe right foraminal stenosis. 2. At L5-S1 there is a broad-based disc osteophyte complex. Moderate bilateral facet arthropathy with a small right facet effusion. Severe left foraminal  stenosis. Mild right foraminal stenosis. 3. At L3-4 there is a broad-based disc bulge. Moderate bilateral facet arthropathy. Mild spinal stenosis. Moderate right and mild left foraminal stenosis. 4. No acute osseous injury of the lumbar spine.   MRI cervical spine 02/10/2023: 1. At T1-2 there is a minimal broad-based disc osteophyte complex. Moderate-severe bilateral foraminal stenosis. 2. No acute osseous injury of the thoracic spine. 3. Partially visualized cervical spine spondylosis. If there is further clinical concern, recommend a dedicated MRI of the cervical spine.  COGNITION: Overall cognitive status: Impaired   SENSATION: WFL  COORDINATION: Finger to nose is slightly impaired  EDEMA:  None noted  MUSCLE TONE: WNL  DTRs:  NT   POSTURE:  decreased lumbar lordosis, tends to slouch  MUSCLE LENGTH: Hamstrings: moderate tightness BLE ITB: slight tight R Piriformis: slight tight BLE Hip flexors: NT  LOWER EXTREMITY ROM:     Active  Right eval Left eval  Hip flexion    Hip extension    Hip abduction  Hip adduction    Hip internal rotation    Hip external rotation    Knee flexion    Knee extension    Ankle dorsiflexion    Ankle plantarflexion    Ankle inversion    Ankle eversion     (Blank rows = not tested)  LOWER EXTREMITY MMT:    MMT Right eval Left eval R 08/06/24 L  08/06/24  Hip flexion      Hip extension      Hip abduction      Hip adduction      Hip internal rotation 4- 4 4 4   Hip external rotation 4+ 4 4 4+  Knee flexion 5 5    Knee extension 5 5    Ankle dorsiflexion 4+ 4    Ankle plantarflexion      Ankle inversion      Ankle eversion      (Blank rows = not tested)  BED MOBILITY:  Independent in all aspects  TRANSFERS: Assistive device utilized: None  Sit to stand: SBA Stand to sit: Complete Independence Chair to chair: SBA Floor: NT  GAIT: Distance walked: 150' in clinic Assistive device utilized: None Level of  assistance: CGA Gait pattern: staggering gait, frequent losses of balance but can correct or catch himself Comments:   STAIRS:  Level of Assistance: SBA  Stair Negotiation Technique: Alternating Pattern  with Single Rail on Right  Number of Stairs: 12    Height of Stairs: 7  Comments: goes quickly to compensate for lack of balance  FUNCTIONAL TESTS:  FGA = 17 5X STS = 9.75 sec TUG = 7 sec Gait speed = 3.11 ft/sec  PATIENT SURVEYS:  ABC = 66.9%   TODAY'S TREATMENT:  08/06/24 Bike L4x24min Tested B hip strength Sidelying R clamshell RTB 2x10 Sidelying R reverse clamshell 2x10- cues to avoid knee extension/flexion Bridges RTB hip ABD 10x3 Monster walk RTB at thighs 6x10'   [facility] PT Assessment - 08/06/24 0001       Standardized Balance Assessment   Standardized Balance Assessment Berg Balance Test      Berg Balance Test   Sit to Stand Able to stand without using hands and stabilize independently    Standing Unsupported Able to stand safely 2 minutes    Sitting with Back Unsupported but Feet Supported on Floor or Stool Able to sit safely and securely 2 minutes    Stand to Sit Sits safely with minimal use of hands    Transfers Able to transfer safely, minor use of hands    Standing Unsupported with Eyes Closed Able to stand 10 seconds safely    Standing Unsupported with Feet Together Able to place feet together independently and stand 1 minute safely    From Standing, Reach Forward with Outstretched Arm Can reach confidently >25 cm (10)    From Standing Position, Pick up Object from Floor Able to pick up shoe safely and easily    From Standing Position, Turn to Look Behind Over each Shoulder Looks behind from both sides and weight shifts well    Turn 360 Degrees Able to turn 360 degrees safely in 4 seconds or less    Standing Unsupported, Alternately Place Feet on Step/Stool Able to stand independently and safely and complete 8 steps in 20 seconds    Standing Unsupported,  One Foot in Front Able to plae foot ahead of the other independently and hold 30 seconds    Standing on One Leg Tries to lift leg/unable  to hold 3 seconds but remains standing independently    Total Score 52          07/23/24 Bike L4 x 8'  NEUROMUSCULAR RE-EDUCATION: To improve balance. Resisted walking RTB F/B, S/S, B/F x 3 laps at counter each Toe/heel rocking x 100 BLE Counter support lunges x 10 BLE Lunge to a 1/2 kneel position on foam (2 pieces) x 2/5 BLE Step ups on 8 with minimal 1UE support x 10 BLE and SBA for cueing for safety and balance Clock cone touches  BLE from 3:00 -9:00 x 5 laps  THERAPEUTIC ACTIVITIES: To improve functional performance.  Demonstration, verbal and tactile cues throughout for technique. Seated knee extension 20# x 2/10 BLE Floor transfers to mat table x 4 trials with CGA and v/c for proper progression from side sit to quadruped to tall kneel, to half kneel and pulling up to table   07/17/24 Nustep L5x12min Education on fall risk prevention Fwd and retro tandem gait Braiding along counter 3x Standing on airex:  Toe taps fwd x 10  Toe taps lateral x 10   Toe taps Post x 10 Marching from airex x 10 R/L SLS on floor with UE support x 30  07/15/24 Gait in hallway 6 laps 150 ft- correcting antalgic gait and decreased stride length Lateral step ups BLE x 20 Hip hikes on step 2x10 B Multifidus raises in quadruped 2x10 B Clock reaches R/L 12 to 6 o'clock Tandem stance + EC 30 sec B  07/11/24  Nustep L5x68min  NEUROMUSCULAR RE-EDUCATION: To improve balance, kinesthesia and proprioception. Resisted gait black TB 3 laps each direction Multifidus walkout Black TB x 10 each way Standing hip abduction x 10 RTB Standing hip extension 2x10 RTB Clock balance 5x R/L 12 to 6 o'clock  07/09/24  Nustep L5x64min  NEUROMUSCULAR RE-EDUCATION: To improve balance, kinesthesia and proprioception. Tandem gait F/B x 5 laps at mat table Braiding forward cross  over x 3 laps counter top down/back Braiding backward cross over x 3 laps counter top down/back Retro and fwd gait along counter 3x w/o support Standing on airex:  Narrow BOS x 1'  Normal BOS EC x 30'  3 way Stepping floor x 10 B    PATIENT EDUCATION:  Education details: proper floor transfer technique, adding lunges to HEP  Person educated: Patient and Spouse Education method: Explanation, Demonstration, Verbal cues, Tactile cues, and MedBridgeGO app access provided Education comprehension: verbalized understanding, verbal cues required, tactile cues required, and needs further education  HOME EXERCISE PROGRAM: Access Code: [email or web address] URL: [email or web address] Date: 08/06/2024 Prepared by: Detric Scalisi  Exercises - Toe Raises with Unilateral Counter Support  - 1 x daily - 7 x weekly - 3 sets - 10 reps - Heel Raises with Counter Support  - 1 x daily - 7 x weekly - 3 sets - 10 reps - Mini Squat with Counter Support  - 1 x daily - 7 x weekly - 3 sets - 10 reps - Standing Tandem Balance with Counter Support  - 1 x daily - 7 x weekly - 1 sets - 1 reps - 1 min hold - Single Leg Stance  - 1 x daily - 7 x weekly - 1 sets - 1 reps - 1 min hold - Carioca with Counter Support  - 1 x daily - 7 x weekly - 3 sets - 10 reps - Tandem Walking with Counter Support  - 1 x daily - 7 x weekly - 3 sets - 10  reps - Backward Tandem Walking with Counter Support  - 1 x daily - 7 x weekly - 3 sets - 10 reps - Sidelying Reverse Clamshell  - 1 x daily - 7 x weekly - 2 sets - 10 reps - Clamshell with Resistance  - 1 x daily - 7 x weekly - 2 sets - 10 reps  Patient Education - What You Can Do to Prevent Falls   ASSESSMENT:  CLINICAL IMPRESSION: Pt completed BERG balance test, meeting LTG #7. He shows strength deficits in R hip > L hip, in lateral hip musculature. He was able to complete all interventions, I progressed his HEP to include more hip strengthening based on deficits found today.    KINCAID TIGER is a 73 y.o. male who was referred to physical therapy for evaluation and treatment for unsteady gait and falls.  Patient presents with physical impairments of impaired activity tolerance, impaired standing balance, impaired ambulation, and decreased safety awareness impacting safe and independent functional mobility.  He covers for his poor overall balance by ambulating and transferring very quickly.   However, when he is asked to walk and do activities more slowly with better control, he looses balance much more easily.   Timed tests do not show balance deficits as follows:  Gait speed 3.11 ft/sec, (2.62 ft/sec is needed for community access),  TUG of 7 sec (>13.5 sec indicates increased risk for falls), and 5xSTS of 9.75 sec (>15 sec indicates increased risk for falls and decreased BLE power).  However, his FGA score is 19/30 indicating a definite fall risk.   ABC scale score of 66.9% indicates a moderate level of physical functioning, but I think he overestimates his abilities and/or has decreased insight into his actual deficits.  HE is advised to use at least a  cane for safety.    Zakkery Dorian will benefit from skilled PT to address above deficits to improve mobility and activity tolerance to help reach the maximal level of functional independence and mobility. Patient demonstrates understanding of this POC and is in agreement with this plan.   OBJECTIVE IMPAIRMENTS: Abnormal gait, difficulty walking, decreased safety awareness, and impaired perceived functional ability.   ACTIVITY LIMITATIONS: carrying, bending, stairs, and locomotion level  PARTICIPATION LIMITATIONS: laundry, shopping, and community activity  PERSONAL FACTORS: Age, Time since onset of injury/illness/exacerbation, and 1-2 comorbidities:   L4/5 TLIF [facility] 6/24; CLL, HTN, memory deficit are also affecting patient's functional outcome.   REHAB POTENTIAL: Good  CLINICAL DECISION MAKING: Evolving/moderate  complexity  EVALUATION COMPLEXITY: Moderate   GOALS: Goals reviewed with patient? Yes  SHORT TERM GOALS: Target date: 07/23/2024   Patient will be independent with initial HEP to improve outcomes and carryover.  Baseline: PT assist required Goal status: IN PROGRESS- 07/15/24   2.  Patient will be educated on strategies to decrease risk of falls.  Baseline: initial education provided (needs sheet) Goal status: MET- 07/17/24  LONG TERM GOALS: Target date: 08/20/2024   Patient will be independent with advanced/ongoing HEP to facilitate ability to maintain/progress functional gains from skilled physical therapy services. Baseline:no advanced HEP yet 9/2:  progressing, but needs help with recall due to forgetfulness Goal status: IN PROGRESS  3.  Patient will be able to step up/down curb safely with LRAD for safety with community ambulation.  Baseline: TBD Goal status: INITIAL   4.  Patient will demonstrate improved BLE strength to >/= 5/5 for improved stability and ease of mobility . Baseline: Refer to above LE MMT  table Goal status: IN PROGRESS- 08/06/24 see table  7.  Patient will improve Berg score to >/= 50/56 to improve safety and stability with ADLs in standing and reduce risk for falls. (MCID= 8 points)  Baseline: TBD Goal status: MET- 08/06/24  9. Patient will improve FGA score to at least  24/30 to improve gait stability and reduce risk for falls. Baseline: 19 Goal status: INITIAL  10.  Patient will report >/= 86% on ABC scale (MCID = 19%) to demonstrate improved balance confidence with functional mobility and gait. Baseline: 66 Goal status: INITIAL   PLAN:  PT FREQUENCY: 1-2x/week  PT DURATION: 8 weeks  PLANNED INTERVENTIONS: 97164- PT Re-evaluation, 97750- Physical Performance Testing, 97110-Therapeutic exercises, 97530- Therapeutic activity, [id number]- Neuromuscular re-education, 97535- Self Care, [id number]- Manual therapy, [id number]- Gait training, [phone]- Electrical  stimulation (unattended), 97016- Vasopneumatic device, [id number]- Ultrasound, [id number]- Traction (mechanical), [phone] (1-2 muscles), 20561 (3+ muscles)- Dry Needling, Patient/Family education, Balance training, Stair training, Taping, Joint mobilization, Cryotherapy, and Moist heat  PLAN FOR NEXT SESSION: ABC scale, dynamic balance, hip rotator strengthening  Sol LITTIE Gaskins, PTA 08/06/2024, 3:39 PM

## 2024-08-07 ENCOUNTER — Ambulatory Visit: Admitting: Rehabilitation

## 2024-08-10 ENCOUNTER — Telehealth: Payer: Self-pay | Admitting: Internal Medicine

## 2024-08-11 MED ORDER — ZOLPIDEM TARTRATE 5 MG PO TABS: 5.0000 mg | ORAL_TABLET | Freq: Every evening | ORAL | 1 refills | Status: AC | PRN

## 2024-08-11 NOTE — Telephone Encounter (Signed)
 Refill request for Ambien  5mg . Unknown last refill date

## 2024-08-11 NOTE — Telephone Encounter (Signed)
 PDMP okay, Rx sent

## 2024-08-12 ENCOUNTER — Encounter: Payer: Self-pay | Admitting: Rehabilitation

## 2024-08-12 ENCOUNTER — Ambulatory Visit: Admitting: Rehabilitation

## 2024-08-12 DIAGNOSIS — M48062 Spinal stenosis, lumbar region with neurogenic claudication: Secondary | ICD-10-CM

## 2024-08-12 DIAGNOSIS — M5459 Other low back pain: Secondary | ICD-10-CM

## 2024-08-12 DIAGNOSIS — M6281 Muscle weakness (generalized): Secondary | ICD-10-CM

## 2024-08-12 DIAGNOSIS — R2689 Other abnormalities of gait and mobility: Secondary | ICD-10-CM

## 2024-08-12 NOTE — Therapy (Signed)
 OUTPATIENT PHYSICAL THERAPY NEURO TREATMENT   Patient Name: Bobby [name] MRN: [id number] DOB:[birth year], 73 y.o., male Today's Date: 08/12/2024   END OF SESSION:  PT End of Session - 08/12/24 1026     Visit Number 9    Authorization Type aetna medicare    PT Start Time 1017    PT Stop Time 1059    PT Time Calculation (min) 42 min    Activity Tolerance Patient tolerated treatment well;No increased pain    Behavior During Therapy [facility] for tasks assessed/performed             Past Medical History:  Diagnosis Date   Anxiety and depression    Hypertension    Hyponatremia 03/2013   ongoing, seeing Dr. Tobie   Insomnia    Melanoma in situ of back [facility])    biopsy on 03/05/2020   SIADH (syndrome of inappropriate ADH production) 2017   Dr. Tobie   Squamous cell carcinoma in situ    Past Surgical History:  Procedure Laterality Date   BRONCHIAL BIOPSY  11/27/2022   Procedure: BRONCHIAL BIOPSIES;  Surgeon: Shelah Lamar RAMAN, MD;  Location: [facility] ENDOSCOPY;  Service: Cardiopulmonary;;   BRONCHIAL BRUSHINGS  11/27/2022   Procedure: BRONCHIAL BRUSHINGS;  Surgeon: Shelah Lamar RAMAN, MD;  Location: [facility] ENDOSCOPY;  Service: Cardiopulmonary;;   BRONCHIAL WASHINGS  11/27/2022   Procedure: BRONCHIAL WASHINGS;  Surgeon: Shelah Lamar RAMAN, MD;  Location: [name];  Service: Cardiopulmonary;;   MOHS SURGERY  ~ 04/2020   face, [facility]   SHOULDER SURGERY  1980s   right shoulder   TRANSFORAMINAL LUMBAR INTERBODY FUSION (TLIF) WITH PEDICLE SCREW FIXATION 1 LEVEL Left 05/17/2023   Procedure: LEFT-SIDED LUMBAR 4- LUMBAR 5 TRANSFORAMINAL LUMBAR INTERBODY FUSION AND DECOMPRESSION WITH INSTRUMENTATION AND ALLOGRAFT;  Surgeon: Beuford Anes, MD;  Location: [facility] OR;  Service: Orthopedics;  Laterality: Left;   VIDEO BRONCHOSCOPY N/A 11/27/2022   Procedure: VIDEO BRONCHOSCOPY WITH FLUORO;  Surgeon: Shelah Lamar RAMAN, MD;  Location: [facility] ENDOSCOPY;  Service: Cardiopulmonary;  Laterality: N/A;   Patient Active Problem  List   Diagnosis Date Noted   Radiculopathy of lumbar region 05/17/2023   CLL (chronic lymphocytic leukemia) (HCC) 02/21/2023   Abnormal CT of the chest 11/01/2022   Melanoma in situ of back (HCC) 03/16/2020   Squamous cell carcinoma in situ 08/21/2019   Vitamin D  deficiency 05/29/2019   PCP NOTES >>>> 09/28/2015   Hypogonadism in male 05/13/2015   SIADH (syndrome of inappropriate ADH production) (HCC) 04/13/2013   MCI (mild cognitive impairment) with memory loss 04/13/2013   Alcohol abuse 04/13/2013   Annual physical exam 03/06/2012   Idiopathic scoliosis and kyphoscoliosis 08/18/2010   BACK PAIN, LUMBAR 07/21/2010   Anxiety state 04/01/2009   Essential hypertension, benign 01/04/2009   Insomnia 09/08/2008    PCP: Amon Aloysius BRAVO, MD   REFERRING PROVIDER: Tobie Tonita POUR, DO   REFERRING DIAG: R26.81 (ICD-10-CM) - Unsteady gait R41.89 (ICD-10-CM) - Cognitive changes  THERAPY DIAG:  Other abnormalities of gait and mobility  Muscle weakness (generalized)  Other low back pain  Spinal stenosis of lumbar region with neurogenic claudication  RATIONALE FOR EVALUATION AND TREATMENT: Rehabilitation  ONSET DATE: prrogressive over last 6 months  NEXT MD VISIT: after brain MRI   SUBJECTIVE:  SUBJECTIVE STATEMENT: States feels ok.  States went fishing in a river last week and fell a couple of times while in the water on slippery rocks, but did not get hurt.  EVAL: 73 y/o male referred to PT from Dr Tobie for unsteady gait and falls.  States he has gotten progressively more unsteady over last 6 months for no apparent reason.  He had therapy here a year ago following a lumbar fusion and did quite well.  He states he really has very little back pain or trouble with his back now.   Balance is  his chief c/o.    Neurology notes indicate that they feel his unsteadiness may be residual effects of the back surgery and residual canal stenosis.  They did not feel he has Parkinson's.   However, he is going to be worked up more for his memory deficits and brain MRI is pending.  Patient reports Having difficulty walking up/down steps carrying things up to the second floor of his home and is generally unsteady going out places.  He has difficulty verbalizing his exact symptoms due to what seems to be cognitive issues  Pt accompanied by: self  PAIN: Are you having pain? Yes: NPRS scale: 0/10 now;  intermittent R lateral thigh pain Pain location: R lateral thigh Pain description: aching Aggravating factors: walking >10 min Relieving factors: standing, not walking  PERTINENT HISTORY:  L4/5 TLIF [place] 6/24; CLL, HTN, memory deficit  PRECAUTIONS: Fall  RED FLAGS: None  WEIGHT BEARING RESTRICTIONS: No  FALLS:  Has patient fallen in last 6 months? Yes. Number of falls 0  LIVING ENVIRONMENT: Lives with: lives with their family and lives with their spouse Lives in: House/apartment Stairs: Yes: Internal: 4 steps; can reach both and External: 1 flight steps; on right going up Has following equipment at home: walking stick when going fishing  OCCUPATION: still rents office space out;  owns a building  PLOF: Independent with gait  PATIENT GOALS: improve my balance    OBJECTIVE: (objective measures completed at initial evaluation unless otherwise dated)  DIAGNOSTIC FINDINGS:  Out-side paper records, electronic medical record, and images have been reviewed where available and summarized as:  MRI lumbar spine 02/10/2023: 1. At L4-5 there is a broad-based disc bulge. Severe bilateral facet arthropathy and ligamentum flavum infolding. Severe spinal stenosis. No left foraminal stenosis. Severe right foraminal stenosis. 2. At L5-S1 there is a broad-based disc osteophyte complex.  Moderate bilateral facet arthropathy with a small right facet effusion. Severe left foraminal stenosis. Mild right foraminal stenosis. 3. At L3-4 there is a broad-based disc bulge. Moderate bilateral facet arthropathy. Mild spinal stenosis. Moderate right and mild left foraminal stenosis. 4. No acute osseous injury of the lumbar spine.   MRI cervical spine 02/10/2023: 1. At T1-2 there is a minimal broad-based disc osteophyte complex. Moderate-severe bilateral foraminal stenosis. 2. No acute osseous injury of the thoracic spine. 3. Partially visualized cervical spine spondylosis. If there is further clinical concern, recommend a dedicated MRI of the cervical spine.  COGNITION: Overall cognitive status: Impaired   SENSATION: WFL  COORDINATION: Finger to nose is slightly impaired  EDEMA:  None noted  MUSCLE TONE: WNL  DTRs:  NT   POSTURE:  decreased lumbar lordosis, tends to slouch  MUSCLE LENGTH: Hamstrings: moderate tightness BLE ITB: slight tight R Piriformis: slight tight BLE Hip flexors: NT  LOWER EXTREMITY ROM:     Active  Right eval Left eval  Hip flexion    Hip extension  Hip abduction    Hip adduction    Hip internal rotation    Hip external rotation    Knee flexion    Knee extension    Ankle dorsiflexion    Ankle plantarflexion    Ankle inversion    Ankle eversion     (Blank rows = not tested)  LOWER EXTREMITY MMT:    MMT Right eval Left eval R 08/06/24 L  08/06/24  Hip flexion      Hip extension      Hip abduction      Hip adduction      Hip internal rotation 4- 4 4 4   Hip external rotation 4+ 4 4 4+  Knee flexion 5 5    Knee extension 5 5    Ankle dorsiflexion 4+ 4    Ankle plantarflexion      Ankle inversion      Ankle eversion      (Blank rows = not tested)  BED MOBILITY:  Independent in all aspects  TRANSFERS: Assistive device utilized: None  Sit to stand: SBA Stand to sit: Complete Independence Chair to chair:  SBA Floor: NT  GAIT: Distance walked: 150' in clinic Assistive device utilized: None Level of assistance: CGA Gait pattern: staggering gait, frequent losses of balance but can correct or catch himself Comments:   STAIRS:  Level of Assistance: SBA  Stair Negotiation Technique: Alternating Pattern  with Single Rail on Right  Number of Stairs: 12    Height of Stairs: 7  Comments: goes quickly to compensate for lack of balance  FUNCTIONAL TESTS:  FGA = 17 5X STS = 9.75 sec TUG = 7 sec Gait speed = 3.11 ft/sec  PATIENT SURVEYS:  ABC = 66.9%   TODAY'S TREATMENT:  08/12/24 THERAPEUTIC EXERCISE: To improve strength.  Demonstration, verbal and tactile cues throughout for technique. NuStep L5 x 8'  Foam to foam stepover with large roll in the middle x 10 F/B;  X 10 S/S Long foam sidestepping x 4 laps Long foam tandem gait F/B X 4 laps [place]arioca step over x 4 laps at counter [place] step behinds x 4 laps at counter Sidestepping with blue TB x 40' x 4 SLS x 1' x2 BLE  08/06/24 Bike L4x59min Tested B hip strength Sidelying R clamshell RTB 2x10 Sidelying R reverse clamshell 2x10- cues to avoid knee extension/flexion Bridges RTB hip ABD 10x3 Monster walk RTB at thighs 6x10'   [facility] PT Assessment - 08/06/24 0001       Standardized Balance Assessment   Standardized Balance Assessment Berg Balance Test      Berg Balance Test   Sit to Stand Able to stand without using hands and stabilize independently    Standing Unsupported Able to stand safely 2 minutes    Sitting with Back Unsupported but Feet Supported on Floor or Stool Able to sit safely and securely 2 minutes    Stand to Sit Sits safely with minimal use of hands    Transfers Able to transfer safely, minor use of hands    Standing Unsupported with Eyes Closed Able to stand 10 seconds safely    Standing Unsupported with Feet Together Able to place feet together independently and stand 1 minute safely    From Standing, Reach  Forward with Outstretched Arm Can reach confidently >25 cm (10)    From Standing Position, Pick up Object from Floor Able to pick up shoe safely and easily    From Standing Position, Turn to Look  Behind Over each Shoulder Looks behind from both sides and weight shifts well    Turn 360 Degrees Able to turn 360 degrees safely in 4 seconds or less    Standing Unsupported, Alternately Place Feet on Step/Stool Able to stand independently and safely and complete 8 steps in 20 seconds    Standing Unsupported, One Foot in Front Able to plae foot ahead of the other independently and hold 30 seconds    Standing on One Leg Tries to lift leg/unable to hold 3 seconds but remains standing independently    Total Score 52          07/23/24 Bike L4 x 8'  NEUROMUSCULAR RE-EDUCATION: To improve balance. Resisted walking RTB F/B, S/S, B/F x 3 laps at counter each Toe/heel rocking x 100 BLE Counter support lunges x 10 BLE Lunge to a 1/2 kneel position on foam (2 pieces) x 2/5 BLE Step ups on 8 with minimal 1UE support x 10 BLE and SBA for cueing for safety and balance Clock cone touches  BLE from 3:00 -9:00 x 5 laps  THERAPEUTIC ACTIVITIES: To improve functional performance.  Demonstration, verbal and tactile cues throughout for technique. Seated knee extension 20# x 2/10 BLE Floor transfers to mat table x 4 trials with CGA and v/c for proper progression from side sit to quadruped to tall kneel, to half kneel and pulling up to table   07/17/24 Nustep L5x43min Education on fall risk prevention Fwd and retro tandem gait Braiding along counter 3x Standing on airex:  Toe taps fwd x 10  Toe taps lateral x 10   Toe taps Post x 10 Marching from airex x 10 R/L SLS on floor with UE support x 30  07/15/24 Gait in hallway 6 laps 150 ft- correcting antalgic gait and decreased stride length Lateral step ups BLE x 20 Hip hikes on step 2x10 B Multifidus raises in quadruped 2x10 B Clock reaches R/L 12 to 6  o'clock Tandem stance + EC 30 sec B  07/11/24  Nustep L5x57min  NEUROMUSCULAR RE-EDUCATION: To improve balance, kinesthesia and proprioception. Resisted gait black TB 3 laps each direction Multifidus walkout Black TB x 10 each way Standing hip abduction x 10 RTB Standing hip extension 2x10 RTB Clock balance 5x R/L 12 to 6 o'clock  07/09/24  Nustep L5x54min  NEUROMUSCULAR RE-EDUCATION: To improve balance, kinesthesia and proprioception. Tandem gait F/B x 5 laps at mat table Braiding forward cross over x 3 laps counter top down/back Braiding backward cross over x 3 laps counter top down/back Retro and fwd gait along counter 3x w/o support Standing on airex:  Narrow BOS x 1'  Normal BOS EC x 30'  3 way Stepping floor x 10 B    PATIENT EDUCATION:  Education details: proper floor transfer technique, adding lunges to HEP  Person educated: Patient and Spouse Education method: Explanation, Demonstration, Verbal cues, Tactile cues, and MedBridgeGO app access provided Education comprehension: verbalized understanding, verbal cues required, tactile cues required, and needs further education  HOME EXERCISE PROGRAM: Access Code: [email or web address] URL: [email or web address] Date: 08/06/2024 Prepared by: Braylin Clark  Exercises - Toe Raises with Unilateral Counter Support  - 1 x daily - 7 x weekly - 3 sets - 10 reps - Heel Raises with Counter Support  - 1 x daily - 7 x weekly - 3 sets - 10 reps - Mini Squat with Counter Support  - 1 x daily - 7 x weekly - 3 sets - 10 reps - Standing Tandem  Balance with Counter Support  - 1 x daily - 7 x weekly - 1 sets - 1 reps - 1 min hold - Single Leg Stance  - 1 x daily - 7 x weekly - 1 sets - 1 reps - 1 min hold - Carioca with Counter Support  - 1 x daily - 7 x weekly - 3 sets - 10 reps - Tandem Walking with Counter Support  - 1 x daily - 7 x weekly - 3 sets - 10 reps - Backward Tandem Walking with Counter Support  - 1 x daily - 7 x weekly - 3  sets - 10 reps - Sidelying Reverse Clamshell  - 1 x daily - 7 x weekly - 2 sets - 10 reps - Clamshell with Resistance  - 1 x daily - 7 x weekly - 2 sets - 10 reps  Patient Education - What You Can Do to Prevent Falls   ASSESSMENT:  CLINICAL IMPRESSION: Patient is making good progress to goals, but hasn't fully met them. He Would like to continue PT x 1 more month so plan to recertify him for 1w4 after next visit.  He is slowly improving with his balance as evidenced by improved BERG score.  He still exhibits unsteady gait, and weakness in the R hip >L.   Pt remains necessary for strength, balance deficits.   Continue per POC  FLOY RIEGLER is a 73 y.o. male who was referred to physical therapy for evaluation and treatment for unsteady gait and falls.  Patient presents with physical impairments of impaired activity tolerance, impaired standing balance, impaired ambulation, and decreased safety awareness impacting safe and independent functional mobility.  He covers for his poor overall balance by ambulating and transferring very quickly.   However, when he is asked to walk and do activities more slowly with better control, he looses balance much more easily.   Timed tests do not show balance deficits as follows:  Gait speed 3.11 ft/sec, (2.62 ft/sec is needed for community access),  TUG of 7 sec (>13.5 sec indicates increased risk for falls), and 5xSTS of 9.75 sec (>15 sec indicates increased risk for falls and decreased BLE power).  However, his FGA score is 19/30 indicating a definite fall risk.   ABC scale score of 66.9% indicates a moderate level of physical functioning, but I think he overestimates his abilities and/or has decreased insight into his actual deficits.  HE is advised to use at least a  cane for safety.    Evie Crumpler will benefit from skilled PT to address above deficits to improve mobility and activity tolerance to help reach the maximal level of functional independence and  mobility. Patient demonstrates understanding of this POC and is in agreement with this plan.   OBJECTIVE IMPAIRMENTS: Abnormal gait, difficulty walking, decreased safety awareness, and impaired perceived functional ability.   ACTIVITY LIMITATIONS: carrying, bending, stairs, and locomotion level  PARTICIPATION LIMITATIONS: laundry, shopping, and community activity  PERSONAL FACTORS: Age, Time since onset of injury/illness/exacerbation, and 1-2 comorbidities:   L4/5 TLIF [facility] 6/24; CLL, HTN, memory deficit are also affecting patient's functional outcome.   REHAB POTENTIAL: Good  CLINICAL DECISION MAKING: Evolving/moderate complexity  EVALUATION COMPLEXITY: Moderate   GOALS: Goals reviewed with patient? Yes  SHORT TERM GOALS: Target date: 07/23/2024   Patient will be independent with initial HEP to improve outcomes and carryover.  Baseline: PT assist required Goal status: IN PROGRESS- 07/15/24   2.  Patient will be educated on strategies  to decrease risk of falls.  Baseline: initial education provided (needs sheet) Goal status: MET- 07/17/24  LONG TERM GOALS: Target date: 08/20/2024   Patient will be independent with advanced/ongoing HEP to facilitate ability to maintain/progress functional gains from skilled physical therapy services. Baseline:no advanced HEP yet 9/2:  progressing, but needs help with recall due to forgetfulness Goal status: IN PROGRESS  3.  Patient will be able to step up/down curb safely with LRAD for safety with community ambulation.  Baseline: TBD Goal status: INITIAL   4.  Patient will demonstrate improved BLE strength to >/= 5/5 for improved stability and ease of mobility . Baseline: Refer to above LE MMT table Goal status: IN PROGRESS- 08/06/24 see table  7.  Patient will improve Berg score to >/= 50/56 to improve safety and stability with ADLs in standing and reduce risk for falls. (MCID= 8 points)  Baseline: TBD Goal status: MET- 08/06/24  9.  Patient will improve FGA score to at least  24/30 to improve gait stability and reduce risk for falls. Baseline: 19 Goal status: INITIAL  10.  Patient will report >/= 86% on ABC scale (MCID = 19%) to demonstrate improved balance confidence with functional mobility and gait. Baseline: 66 Goal status: INITIAL   PLAN:  PT FREQUENCY: 1-2x/week  PT DURATION: 8 weeks  PLANNED INTERVENTIONS: 97164- PT Re-evaluation, 97750- Physical Performance Testing, 97110-Therapeutic exercises, 97530- Therapeutic activity, [id number]- Neuromuscular re-education, 97535- Self Care, [id number]- Manual therapy, [id number]- Gait training, [phone]- Electrical stimulation (unattended), 97016- Vasopneumatic device, [id number]- Ultrasound, [id number]- Traction (mechanical), [phone] (1-2 muscles), 20561 (3+ muscles)- Dry Needling, Patient/Family education, Balance training, Stair training, Taping, Joint mobilization, Cryotherapy, and Moist heat  PLAN FOR NEXT SESSION: Recertification next visit, ABC scale, FGA,  hip rotator strengthening  Shanna Un, PT 08/12/2024, 8:17 PM

## 2024-08-14 ENCOUNTER — Institutional Professional Consult (permissible substitution): Admitting: Psychology

## 2024-08-14 ENCOUNTER — Ambulatory Visit: Payer: Self-pay

## 2024-08-15 ENCOUNTER — Ambulatory Visit

## 2024-08-15 DIAGNOSIS — M6281 Muscle weakness (generalized): Secondary | ICD-10-CM

## 2024-08-15 DIAGNOSIS — R2689 Other abnormalities of gait and mobility: Secondary | ICD-10-CM

## 2024-08-15 DIAGNOSIS — M5459 Other low back pain: Secondary | ICD-10-CM | POA: Diagnosis not present

## 2024-08-15 DIAGNOSIS — M48062 Spinal stenosis, lumbar region with neurogenic claudication: Secondary | ICD-10-CM | POA: Diagnosis not present

## 2024-08-15 NOTE — Therapy (Addendum)
 OUTPATIENT PHYSICAL THERAPY NEURO TREATMENT  Progress Note Reporting Period 06/24/24 to 08/15/24  See note below for Objective Data and Assessment of Progress/Goals.      Patient Name: Bobby [name] MRN: [id number] DOB:August 05, 1951, 73 y.o., male Today's Date: 08/15/2024   END OF SESSION:  PT End of Session - 08/15/24 1119     Visit Number 10    Authorization Type aetna medicare    PT Start Time 1019    PT Stop Time 1108    PT Time Calculation (min) 49 min    Activity Tolerance Patient tolerated treatment well;No increased pain    Behavior During Therapy [facility] for tasks assessed/performed              Past Medical History:  Diagnosis Date   Anxiety and depression    Hypertension    Hyponatremia 03/2013   ongoing, seeing Dr. Tobie   Insomnia    Melanoma in situ of back [facility])    biopsy on 03/05/2020   SIADH (syndrome of inappropriate ADH production) 2017   Dr. Tobie   Squamous cell carcinoma in situ    Past Surgical History:  Procedure Laterality Date   BRONCHIAL BIOPSY  11/27/2022   Procedure: BRONCHIAL BIOPSIES;  Surgeon: Shelah Lamar RAMAN, MD;  Location: [facility] ENDOSCOPY;  Service: Cardiopulmonary;;   BRONCHIAL BRUSHINGS  11/27/2022   Procedure: BRONCHIAL BRUSHINGS;  Surgeon: Shelah Lamar RAMAN, MD;  Location: [facility] ENDOSCOPY;  Service: Cardiopulmonary;;   BRONCHIAL WASHINGS  11/27/2022   Procedure: BRONCHIAL WASHINGS;  Surgeon: Shelah Lamar RAMAN, MD;  Location: [name];  Service: Cardiopulmonary;;   MOHS SURGERY  ~ 04/2020   face, [facility]   SHOULDER SURGERY  1980s   right shoulder   TRANSFORAMINAL LUMBAR INTERBODY FUSION (TLIF) WITH PEDICLE SCREW FIXATION 1 LEVEL Left 05/17/2023   Procedure: LEFT-SIDED LUMBAR 4- LUMBAR 5 TRANSFORAMINAL LUMBAR INTERBODY FUSION AND DECOMPRESSION WITH INSTRUMENTATION AND ALLOGRAFT;  Surgeon: Beuford Anes, MD;  Location: [facility] OR;  Service: Orthopedics;  Laterality: Left;   VIDEO BRONCHOSCOPY N/A 11/27/2022   Procedure: VIDEO BRONCHOSCOPY WITH  FLUORO;  Surgeon: Shelah Lamar RAMAN, MD;  Location: [facility] ENDOSCOPY;  Service: Cardiopulmonary;  Laterality: N/A;   Patient Active Problem List   Diagnosis Date Noted   Radiculopathy of lumbar region 05/17/2023   CLL (chronic lymphocytic leukemia) (HCC) 02/21/2023   Abnormal CT of the chest 11/01/2022   Melanoma in situ of back (HCC) 03/16/2020   Squamous cell carcinoma in situ 08/21/2019   Vitamin D  deficiency 05/29/2019   PCP NOTES >>>> 09/28/2015   Hypogonadism in male 05/13/2015   SIADH (syndrome of inappropriate ADH production) (HCC) 04/13/2013   MCI (mild cognitive impairment) with memory loss 04/13/2013   Alcohol abuse 04/13/2013   Annual physical exam 03/06/2012   Idiopathic scoliosis and kyphoscoliosis 08/18/2010   BACK PAIN, LUMBAR 07/21/2010   Anxiety state 04/01/2009   Essential hypertension, benign 01/04/2009   Insomnia 09/08/2008    PCP: Amon Aloysius BRAVO, MD   REFERRING PROVIDER: Tobie Tonita POUR, DO   REFERRING DIAG: R26.81 (ICD-10-CM) - Unsteady gait R41.89 (ICD-10-CM) - Cognitive changes  THERAPY DIAG:  Other abnormalities of gait and mobility  Muscle weakness (generalized)  Other low back pain  RATIONALE FOR EVALUATION AND TREATMENT: Rehabilitation  ONSET DATE: prrogressive over last 6 months  NEXT MD VISIT: after brain MRI   SUBJECTIVE:  SUBJECTIVE STATEMENT: Pt reports his balance has improved,   EVAL: 73 y/o male referred to PT from Dr Tobie for unsteady gait and falls.  States he has gotten progressively more unsteady over last 6 months for no apparent reason.  He had therapy here a year ago following a lumbar fusion and did quite well.  He states he really has very little back pain or trouble with his back now.   Balance is his chief c/o.    Neurology notes  indicate that they feel his unsteadiness may be residual effects of the back surgery and residual canal stenosis.  They did not feel he has Parkinson's.   However, he is going to be worked up more for his memory deficits and brain MRI is pending.  Patient reports Having difficulty walking up/down steps carrying things up to the second floor of his home and is generally unsteady going out places.  He has difficulty verbalizing his exact symptoms due to what seems to be cognitive issues  Pt accompanied by: self  PAIN: Are you having pain? Yes: NPRS scale: 0/10 now;  intermittent R lateral thigh pain Pain location: R lateral thigh Pain description: aching Aggravating factors: walking >10 min Relieving factors: standing, not walking  PERTINENT HISTORY:  L4/5 TLIF [place] 6/24; CLL, HTN, memory deficit  PRECAUTIONS: Fall  RED FLAGS: None  WEIGHT BEARING RESTRICTIONS: No  FALLS:  Has patient fallen in last 6 months? Yes. Number of falls 0  LIVING ENVIRONMENT: Lives with: lives with their family and lives with their spouse Lives in: House/apartment Stairs: Yes: Internal: 4 steps; can reach both and External: 1 flight steps; on right going up Has following equipment at home: walking stick when going fishing  OCCUPATION: still rents office space out;  owns a building  PLOF: Independent with gait  PATIENT GOALS: improve my balance    OBJECTIVE: (objective measures completed at initial evaluation unless otherwise dated)  DIAGNOSTIC FINDINGS:  Out-side paper records, electronic medical record, and images have been reviewed where available and summarized as:  MRI lumbar spine 02/10/2023: 1. At L4-5 there is a broad-based disc bulge. Severe bilateral facet arthropathy and ligamentum flavum infolding. Severe spinal stenosis. No left foraminal stenosis. Severe right foraminal stenosis. 2. At L5-S1 there is a broad-based disc osteophyte complex. Moderate bilateral facet arthropathy  with a small right facet effusion. Severe left foraminal stenosis. Mild right foraminal stenosis. 3. At L3-4 there is a broad-based disc bulge. Moderate bilateral facet arthropathy. Mild spinal stenosis. Moderate right and mild left foraminal stenosis. 4. No acute osseous injury of the lumbar spine.   MRI cervical spine 02/10/2023: 1. At T1-2 there is a minimal broad-based disc osteophyte complex. Moderate-severe bilateral foraminal stenosis. 2. No acute osseous injury of the thoracic spine. 3. Partially visualized cervical spine spondylosis. If there is further clinical concern, recommend a dedicated MRI of the cervical spine.  COGNITION: Overall cognitive status: Impaired   SENSATION: WFL  COORDINATION: Finger to nose is slightly impaired  EDEMA:  None noted  MUSCLE TONE: WNL  DTRs:  NT   POSTURE:  decreased lumbar lordosis, tends to slouch  MUSCLE LENGTH: Hamstrings: moderate tightness BLE ITB: slight tight R Piriformis: slight tight BLE Hip flexors: NT  LOWER EXTREMITY ROM:     Active  Right eval Left eval  Hip flexion    Hip extension    Hip abduction    Hip adduction    Hip internal rotation    Hip external rotation  Knee flexion    Knee extension    Ankle dorsiflexion    Ankle plantarflexion    Ankle inversion    Ankle eversion     (Blank rows = not tested)  LOWER EXTREMITY MMT:    MMT Right eval Left eval R 08/06/24 L  08/06/24  Hip flexion      Hip extension      Hip abduction      Hip adduction      Hip internal rotation 4- 4 4 4   Hip external rotation 4+ 4 4 4+  Knee flexion 5 5    Knee extension 5 5    Ankle dorsiflexion 4+ 4    Ankle plantarflexion      Ankle inversion      Ankle eversion      (Blank rows = not tested)  BED MOBILITY:  Independent in all aspects  TRANSFERS: Assistive device utilized: None  Sit to stand: SBA Stand to sit: Complete Independence Chair to chair: SBA Floor: NT  GAIT: Distance walked: 150'  in clinic Assistive device utilized: None Level of assistance: CGA Gait pattern: staggering gait, frequent losses of balance but can correct or catch himself Comments:   STAIRS:  Level of Assistance: SBA  Stair Negotiation Technique: Alternating Pattern  with Single Rail on Right  Number of Stairs: 12    Height of Stairs: 7  Comments: goes quickly to compensate for lack of balance  FUNCTIONAL TESTS:  FGA = 17 5X STS = 9.75 sec TUG = 7 sec Gait speed = 3.11 ft/sec  PATIENT SURVEYS:  ABC = 66.9%   TODAY'S TREATMENT:  08/15/24 Bike L3x60min Resisted gait 4 way with black TB 3x10' each way Clock reaches R/L x 5 12 to 6 o'clock- intermittent UE support Sidesteps on foam beam 4x each direction Tandem walk on foam beam 4x each direction  FGA- 23/30- 4 points higher than before ABC scale: 1560 / 1600 = 97.5 %  08/12/24 THERAPEUTIC EXERCISE: To improve strength.  Demonstration, verbal and tactile cues throughout for technique. NuStep L5 x 8'  Foam to foam stepover with large roll in the middle x 10 F/B;  X 10 S/S Long foam sidestepping x 4 laps Long foam tandem gait F/B X 4 laps Carioca step over x 4 laps at counter Carioca step behinds x 4 laps at counter Sidestepping with blue TB x 40' x 4 SLS x 1' x2 BLE  08/06/24 Bike L4x5min Tested B hip strength Sidelying R clamshell RTB 2x10 Sidelying R reverse clamshell 2x10- cues to avoid knee extension/flexion Bridges RTB hip ABD 10x3 Monster walk RTB at thighs 6x10'   [facility] PT Assessment - 08/06/24 0001       Standardized Balance Assessment   Standardized Balance Assessment Berg Balance Test      Berg Balance Test   Sit to Stand Able to stand without using hands and stabilize independently    Standing Unsupported Able to stand safely 2 minutes    Sitting with Back Unsupported but Feet Supported on Floor or Stool Able to sit safely and securely 2 minutes    Stand to Sit Sits safely with minimal use of hands    Transfers  Able to transfer safely, minor use of hands    Standing Unsupported with Eyes Closed Able to stand 10 seconds safely    Standing Unsupported with Feet Together Able to place feet together independently and stand 1 minute safely    From Standing, Reach Forward with Outstretched  Arm Can reach confidently >25 cm (10)    From Standing Position, Pick up Object from Floor Able to pick up shoe safely and easily    From Standing Position, Turn to Look Behind Over each Shoulder Looks behind from both sides and weight shifts well    Turn 360 Degrees Able to turn 360 degrees safely in 4 seconds or less    Standing Unsupported, Alternately Place Feet on Step/Stool Able to stand independently and safely and complete 8 steps in 20 seconds    Standing Unsupported, One Foot in Front Able to plae foot ahead of the other independently and hold 30 seconds    Standing on One Leg Tries to lift leg/unable to hold 3 seconds but remains standing independently    Total Score 52          07/23/24 Bike L4 x 8'  NEUROMUSCULAR RE-EDUCATION: To improve balance. Resisted walking RTB F/B, S/S, B/F x 3 laps at counter each Toe/heel rocking x 100 BLE Counter support lunges x 10 BLE Lunge to a 1/2 kneel position on foam (2 pieces) x 2/5 BLE Step ups on 8 with minimal 1UE support x 10 BLE and SBA for cueing for safety and balance Clock cone touches  BLE from 3:00 -9:00 x 5 laps  THERAPEUTIC ACTIVITIES: To improve functional performance.  Demonstration, verbal and tactile cues throughout for technique. Seated knee extension 20# x 2/10 BLE Floor transfers to mat table x 4 trials with CGA and v/c for proper progression from side sit to quadruped to tall kneel, to half kneel and pulling up to table   07/17/24 Nustep L5x16min Education on fall risk prevention Fwd and retro tandem gait Braiding along counter 3x Standing on airex:  Toe taps fwd x 10  Toe taps lateral x 10   Toe taps Post x 10 Marching from airex x  10 R/L SLS on floor with UE support x 30  07/15/24 Gait in hallway 6 laps 150 ft- correcting antalgic gait and decreased stride length Lateral step ups BLE x 20 Hip hikes on step 2x10 B Multifidus raises in quadruped 2x10 B Clock reaches R/L 12 to 6 o'clock Tandem stance + EC 30 sec B  07/11/24  Nustep L5x35min  NEUROMUSCULAR RE-EDUCATION: To improve balance, kinesthesia and proprioception. Resisted gait black TB 3 laps each direction Multifidus walkout Black TB x 10 each way Standing hip abduction x 10 RTB Standing hip extension 2x10 RTB Clock balance 5x R/L 12 to 6 o'clock  07/09/24  Nustep L5x47min  NEUROMUSCULAR RE-EDUCATION: To improve balance, kinesthesia and proprioception. Tandem gait F/B x 5 laps at mat table Braiding forward cross over x 3 laps counter top down/back Braiding backward cross over x 3 laps counter top down/back Retro and fwd gait along counter 3x w/o support Standing on airex:  Narrow BOS x 1'  Normal BOS EC x 30'  3 way Stepping floor x 10 B    PATIENT EDUCATION:  Education details: proper floor transfer technique, adding lunges to HEP  Person educated: Patient and Spouse Education method: Explanation, Demonstration, Verbal cues, Tactile cues, and MedBridgeGO app access provided Education comprehension: verbalized understanding, verbal cues required, tactile cues required, and needs further education  HOME EXERCISE PROGRAM: Access Code: [email or web address] URL: [email or web address] Date: 08/06/2024 Prepared by: Shlome Baldree  Exercises - Toe Raises with Unilateral Counter Support  - 1 x daily - 7 x weekly - 3 sets - 10 reps - Heel Raises with Counter Support  - 1 x  daily - 7 x weekly - 3 sets - 10 reps - Mini Squat with Counter Support  - 1 x daily - 7 x weekly - 3 sets - 10 reps - Standing Tandem Balance with Counter Support  - 1 x daily - 7 x weekly - 1 sets - 1 reps - 1 min hold - Single Leg Stance  - 1 x daily - 7 x weekly - 1 sets -  1 reps - 1 min hold - Carioca with Counter Support  - 1 x daily - 7 x weekly - 3 sets - 10 reps - Tandem Walking with Counter Support  - 1 x daily - 7 x weekly - 3 sets - 10 reps - Backward Tandem Walking with Counter Support  - 1 x daily - 7 x weekly - 3 sets - 10 reps - Sidelying Reverse Clamshell  - 1 x daily - 7 x weekly - 2 sets - 10 reps - Clamshell with Resistance  - 1 x daily - 7 x weekly - 2 sets - 10 reps  Patient Education - What You Can Do to Prevent Falls   ASSESSMENT:  CLINICAL IMPRESSION: Mr. Noseworthy has improved his FGA score as well as his ABC scale score, meeting both of these LTGs. We worked on dynamic balance activities on more compliant surfaces for more challenge and resisted walking for proximal LE strength. He shows difficulty with retro walking and tandem walking on the balance beam. He may need to work on walking on more uneven terrain. He continues to benefit from skilled therapy.  RIDER ERMIS is a 73 y.o. male who was referred to physical therapy for evaluation and treatment for unsteady gait and falls.  Patient presents with physical impairments of impaired activity tolerance, impaired standing balance, impaired ambulation, and decreased safety awareness impacting safe and independent functional mobility.  He covers for his poor overall balance by ambulating and transferring very quickly.   However, when he is asked to walk and do activities more slowly with better control, he looses balance much more easily.   Timed tests do not show balance deficits as follows:  Gait speed 3.11 ft/sec, (2.62 ft/sec is needed for community access),  TUG of 7 sec (>13.5 sec indicates increased risk for falls), and 5xSTS of 9.75 sec (>15 sec indicates increased risk for falls and decreased BLE power).  However, his FGA score is 19/30 indicating a definite fall risk.   ABC scale score of 66.9% indicates a moderate level of physical functioning, but I think he overestimates his  abilities and/or has decreased insight into his actual deficits.  HE is advised to use at least a  cane for safety.    Shoichi Mielke will benefit from skilled PT to address above deficits to improve mobility and activity tolerance to help reach the maximal level of functional independence and mobility. Patient demonstrates understanding of this POC and is in agreement with this plan.   OBJECTIVE IMPAIRMENTS: Abnormal gait, difficulty walking, decreased safety awareness, and impaired perceived functional ability.   ACTIVITY LIMITATIONS: carrying, bending, stairs, and locomotion level  PARTICIPATION LIMITATIONS: laundry, shopping, and community activity  PERSONAL FACTORS: Age, Time since onset of injury/illness/exacerbation, and 1-2 comorbidities:   L4/5 TLIF [facility] 6/24; CLL, HTN, memory deficit are also affecting patient's functional outcome.   REHAB POTENTIAL: Good  CLINICAL DECISION MAKING: Evolving/moderate complexity  EVALUATION COMPLEXITY: Moderate   GOALS: Goals reviewed with patient? Yes  SHORT TERM GOALS: Target date: 07/23/2024  Patient will be independent with initial HEP to improve outcomes and carryover.  Baseline: PT assist required Goal status: IN PROGRESS- 07/15/24   2.  Patient will be educated on strategies to decrease risk of falls.  Baseline: initial education provided (needs sheet) Goal status: MET- 07/17/24  LONG TERM GOALS: Target date: 08/20/2024   Patient will be independent with advanced/ongoing HEP to facilitate ability to maintain/progress functional gains from skilled physical therapy services. Baseline:no advanced HEP yet 9/2:  progressing, but needs help with recall due to forgetfulness Goal status: IN PROGRESS  3.  Patient will be able to step up/down curb safely with LRAD for safety with community ambulation.  Baseline: TBD Goal status: INITIAL   4.  Patient will demonstrate improved BLE strength to >/= 5/5 for improved stability and ease of  mobility . Baseline: Refer to above LE MMT table Goal status: IN PROGRESS- 08/06/24 see table  7.  Patient will improve Berg score to >/= 50/56 to improve safety and stability with ADLs in standing and reduce risk for falls. (MCID= 8 points)  Baseline: TBD Goal status: MET- 08/06/24  9. Patient will improve FGA score to at least  24/30 to improve gait stability and reduce risk for falls. Baseline: 19 Goal status: MET- 24/30 08/15/24  10.  Patient will report >/= 86% on ABC scale (MCID = 19%) to demonstrate improved balance confidence with functional mobility and gait. Baseline: 66 Goal status: MET 1560 / 1600 = 97.5 %   PLAN:  PT FREQUENCY: 1-2x/week  PT DURATION: 8 weeks  PLANNED INTERVENTIONS: 97164- PT Re-evaluation, 97750- Physical Performance Testing, 97110-Therapeutic exercises, 97530- Therapeutic activity, [id number]- Neuromuscular re-education, 97535- Self Care, [id number]- Manual therapy, [id number]- Gait training, [id number]- Electrical stimulation (unattended), 97016- Vasopneumatic device, [id number]- Ultrasound, [id number]- Traction (mechanical), 20560 (1-2 muscles), 20561 (3+ muscles)- Dry Needling, Patient/Family education, Balance training, Stair training, Taping, Joint mobilization, Cryotherapy, and Moist heat  PLAN FOR NEXT SESSION: Recertification next visit, gait on uneven terrain/complaint surfaces, hip rotator strengthening  Navjot Pilgrim L Adolfo Granieri, PTA 08/15/2024, 11:40 AM  Patient has been seen x 10 PT visits over the last 2 months for balance deficits.   He has had several missed appointments so we would like to continue his PT x 4 more weeks.   He has made good progress as evidenced by his improvements in BERG balance and ABC scores.  However, he still has unsteady gait at times with losses of balance. PT remains necessary and plan is to recertify service next visit for 1w4  Garnette Montclair, PT  [facility] [address]  [address] [place], [place], [address] Phone: [phone]   Fax:  [phone]

## 2024-08-19 ENCOUNTER — Ambulatory Visit: Admitting: Rehabilitation

## 2024-08-19 ENCOUNTER — Encounter: Payer: Self-pay | Admitting: Rehabilitation

## 2024-08-19 DIAGNOSIS — M6281 Muscle weakness (generalized): Secondary | ICD-10-CM | POA: Diagnosis not present

## 2024-08-19 DIAGNOSIS — M5459 Other low back pain: Secondary | ICD-10-CM

## 2024-08-19 DIAGNOSIS — M48062 Spinal stenosis, lumbar region with neurogenic claudication: Secondary | ICD-10-CM | POA: Diagnosis not present

## 2024-08-19 DIAGNOSIS — R2689 Other abnormalities of gait and mobility: Secondary | ICD-10-CM

## 2024-08-19 NOTE — Therapy (Signed)
 OUTPATIENT PHYSICAL THERAPY NEURO TREATMENT / RECERTIFICATION   Patient Name: Bobby [name] MRN: [id number] DOB:May 17, 1951, 73 y.o., male Today's Date: 08/19/2024   END OF SESSION:  PT End of Session - 08/19/24 1024     Visit Number 11    Authorization Type aetna medicare    PT Start Time 1018    PT Stop Time 1059    PT Time Calculation (min) 41 min    Activity Tolerance Patient tolerated treatment well;No increased pain    Behavior During Therapy [facility] for tasks assessed/performed              Past Medical History:  Diagnosis Date   Anxiety and depression    Hypertension    Hyponatremia 03/2013   ongoing, seeing Dr. Tobie   Insomnia    Melanoma in situ of back [facility])    biopsy on 03/05/2020   SIADH (syndrome of inappropriate ADH production) 2017   Dr. Tobie   Squamous cell carcinoma in situ    Past Surgical History:  Procedure Laterality Date   BRONCHIAL BIOPSY  11/27/2022   Procedure: BRONCHIAL BIOPSIES;  Surgeon: Shelah Lamar RAMAN, MD;  Location: [facility] ENDOSCOPY;  Service: Cardiopulmonary;;   BRONCHIAL BRUSHINGS  11/27/2022   Procedure: BRONCHIAL BRUSHINGS;  Surgeon: Shelah Lamar RAMAN, MD;  Location: [facility] ENDOSCOPY;  Service: Cardiopulmonary;;   BRONCHIAL WASHINGS  11/27/2022   Procedure: BRONCHIAL WASHINGS;  Surgeon: Shelah Lamar RAMAN, MD;  Location: [name];  Service: Cardiopulmonary;;   MOHS SURGERY  ~ 04/2020   face, [facility]   SHOULDER SURGERY  1980s   right shoulder   TRANSFORAMINAL LUMBAR INTERBODY FUSION (TLIF) WITH PEDICLE SCREW FIXATION 1 LEVEL Left 05/17/2023   Procedure: LEFT-SIDED LUMBAR 4- LUMBAR 5 TRANSFORAMINAL LUMBAR INTERBODY FUSION AND DECOMPRESSION WITH INSTRUMENTATION AND ALLOGRAFT;  Surgeon: Beuford Anes, MD;  Location: [facility] OR;  Service: Orthopedics;  Laterality: Left;   VIDEO BRONCHOSCOPY N/A 11/27/2022   Procedure: VIDEO BRONCHOSCOPY WITH FLUORO;  Surgeon: Shelah Lamar RAMAN, MD;  Location: [facility] ENDOSCOPY;  Service: Cardiopulmonary;  Laterality: N/A;    Patient Active Problem List   Diagnosis Date Noted   Radiculopathy of lumbar region 05/17/2023   CLL (chronic lymphocytic leukemia) (HCC) 02/21/2023   Abnormal CT of the chest 11/01/2022   Melanoma in situ of back (HCC) 03/16/2020   Squamous cell carcinoma in situ 08/21/2019   Vitamin D  deficiency 05/29/2019   PCP NOTES >>>> 09/28/2015   Hypogonadism in male 05/13/2015   SIADH (syndrome of inappropriate ADH production) (HCC) 04/13/2013   MCI (mild cognitive impairment) with memory loss 04/13/2013   Alcohol abuse 04/13/2013   Annual physical exam 03/06/2012   Idiopathic scoliosis and kyphoscoliosis 08/18/2010   BACK PAIN, LUMBAR 07/21/2010   Anxiety state 04/01/2009   Essential hypertension, benign 01/04/2009   Insomnia 09/08/2008    PCP: Amon Aloysius BRAVO, MD   REFERRING PROVIDER: Tobie Tonita POUR, DO   REFERRING DIAG: R26.81 (ICD-10-CM) - Unsteady gait R41.89 (ICD-10-CM) - Cognitive changes  THERAPY DIAG:  Other abnormalities of gait and mobility - Plan: PT plan of care cert/re-cert  Muscle weakness (generalized) - Plan: PT plan of care cert/re-cert  Other low back pain - Plan: PT plan of care cert/re-cert  Spinal stenosis of lumbar region with neurogenic claudication - Plan: PT plan of care cert/re-cert  RATIONALE FOR EVALUATION AND TREATMENT: Rehabilitation  ONSET DATE: prrogressive over last 6 months  NEXT MD VISIT: after brain MRI   SUBJECTIVE:  SUBJECTIVE STATEMENT: Patient states feeling better today.  Denies any falls.   A little c/o back pain, but he is using his inversion table  EVAL: 73 y/o male referred to PT from Dr Tobie for unsteady gait and falls.  States he has gotten progressively more unsteady over last 6 months for no apparent reason.  He had therapy here a  year ago following a lumbar fusion and did quite well.  He states he really has very little back pain or trouble with his back now.   Balance is his chief c/o.    Neurology notes indicate that they feel his unsteadiness may be residual effects of the back surgery and residual canal stenosis.  They did not feel he has Parkinson's.   However, he is going to be worked up more for his memory deficits and brain MRI is pending.  Patient reports Having difficulty walking up/down steps carrying things up to the second floor of his home and is generally unsteady going out places.  He has difficulty verbalizing his exact symptoms due to what seems to be cognitive issues  Pt accompanied by: self  PAIN: Are you having pain? Yes: NPRS scale: 0/10 now;  intermittent R lateral thigh pain Pain location: R lateral thigh Pain description: aching Aggravating factors: walking >10 min Relieving factors: standing, not walking  PERTINENT HISTORY:  L4/5 TLIF [place] 6/24; CLL, HTN, memory deficit  PRECAUTIONS: Fall  RED FLAGS: None  WEIGHT BEARING RESTRICTIONS: No  FALLS:  Has patient fallen in last 6 months? Yes. Number of falls 0  LIVING ENVIRONMENT: Lives with: lives with their family and lives with their spouse Lives in: House/apartment Stairs: Yes: Internal: 4 steps; can reach both and External: 1 flight steps; on right going up Has following equipment at home: walking stick when going fishing  OCCUPATION: still rents office space out;  owns a building  PLOF: Independent with gait  PATIENT GOALS: improve my balance    OBJECTIVE: (objective measures completed at initial evaluation unless otherwise dated)  DIAGNOSTIC FINDINGS:  Out-side paper records, electronic medical record, and images have been reviewed where available and summarized as:  MRI lumbar spine 02/10/2023: 1. At L4-5 there is a broad-based disc bulge. Severe bilateral facet arthropathy and ligamentum flavum infolding. Severe  spinal stenosis. No left foraminal stenosis. Severe right foraminal stenosis. 2. At L5-S1 there is a broad-based disc osteophyte complex. Moderate bilateral facet arthropathy with a small right facet effusion. Severe left foraminal stenosis. Mild right foraminal stenosis. 3. At L3-4 there is a broad-based disc bulge. Moderate bilateral facet arthropathy. Mild spinal stenosis. Moderate right and mild left foraminal stenosis. 4. No acute osseous injury of the lumbar spine.   MRI cervical spine 02/10/2023: 1. At T1-2 there is a minimal broad-based disc osteophyte complex. Moderate-severe bilateral foraminal stenosis. 2. No acute osseous injury of the thoracic spine. 3. Partially visualized cervical spine spondylosis. If there is further clinical concern, recommend a dedicated MRI of the cervical spine.  COGNITION: Overall cognitive status: Impaired   SENSATION: WFL  COORDINATION: Finger to nose is slightly impaired  EDEMA:  None noted  MUSCLE TONE: WNL  DTRs:  NT   POSTURE:  decreased lumbar lordosis, tends to slouch  MUSCLE LENGTH: Hamstrings: moderate tightness BLE ITB: slight tight R Piriformis: slight tight BLE Hip flexors: NT  LOWER EXTREMITY ROM:     Active  Right eval Left eval  Hip flexion    Hip extension    Hip abduction  Hip adduction    Hip internal rotation    Hip external rotation    Knee flexion    Knee extension    Ankle dorsiflexion    Ankle plantarflexion    Ankle inversion    Ankle eversion     (Blank rows = not tested)  LOWER EXTREMITY MMT:    MMT Right eval Left eval R 08/06/24 L  08/06/24 RLE 08/19/24 LLE 08/19/24  Hip flexion     5 5  Hip extension     4- 4-  Hip abduction     4- 4-  Hip adduction        Hip internal rotation 4- 4 4 4  4+ 4+  Hip external rotation 4+ 4 4 4+ 4+ 4+  Knee flexion 5 5   5 5   Knee extension 5 5   5 5   Ankle dorsiflexion 4+ 4   5 4+  Ankle plantarflexion     4 5  Ankle inversion        Ankle  eversion        (Blank rows = not tested)  BED MOBILITY:  Independent in all aspects  TRANSFERS: Assistive device utilized: None  Sit to stand: SBA Stand to sit: Complete Independence Chair to chair: SBA Floor: NT  GAIT: Distance walked: 150' in clinic Assistive device utilized: None Level of assistance: CGA Gait pattern: staggering gait, frequent losses of balance but can correct or catch himself Comments:   STAIRS:  Level of Assistance: SBA  Stair Negotiation Technique: Alternating Pattern  with Single Rail on Right  Number of Stairs: 12    Height of Stairs: 7  Comments: goes quickly to compensate for lack of balance  FUNCTIONAL TESTS:  FGA = 17 5X STS = 9.75 sec TUG = 7 sec Gait speed = 3.11 ft/sec  08/19/24: TUG counting backward = 12.3 sec;  has 2 losses of balance TUG Carrying full glass water = 11.3 sec;  unable to keep water from spilling  PATIENT SURVEYS:  ABC = 66.9%   TODAY'S TREATMENT:  08/19/24 THERAPEUTIC EXERCISE: To improve strength.  Demonstration, verbal and tactile cues throughout for technique. Bike L 4 x 8'  NEUROMUSCULAR RE-EDUCATION: To improve balance, posture, and proprioception. Aerobic step w/ airex pad lateral step up and across x 20 B Stand to 1/2 kneel on airex pad x 2/5 BLE Checked cognitive TUG and carrying water TUG  THERAPEUTIC ACTIVITIES: To improve functional performance.  Demonstration, verbal and tactile cues throughout for technique. Floor to stand transfer x 2 w/ CGA for proper technique rather than trying to stand from a side sit position pushing on hands Rechecked strength Wall squats w/ blue TB x 3/10 BLE   08/15/24 Bike L3x77min Resisted gait 4 way with black TB 3x10' each way Clock reaches R/L x 5 12 to 6 o'clock- intermittent UE support Sidesteps on foam beam 4x each direction Tandem walk on foam beam 4x each direction  FGA- 23/30- 4 points higher than before ABC scale: 1560 / 1600 = 97.5 %  PATIENT EDUCATION:   Education details: proper floor transfer technique, adding lunges to HEP  Person educated: Patient and Spouse Education method: Explanation, Demonstration, Verbal cues, Tactile cues, and MedBridge[employer] app access provided Education comprehension: verbalized understanding, verbal cues required, tactile cues required, and needs further education  HOME EXERCISE PROGRAM: Access Code: [email or web address] URL: [email or web address] Date: 08/06/2024 Prepared by: Braylin Clark  Exercises - Toe Raises with Unilateral Counter Support  - 1 x daily -  7 x weekly - 3 sets - 10 reps - Heel Raises with Counter Support  - 1 x daily - 7 x weekly - 3 sets - 10 reps - Mini Squat with Counter Support  - 1 x daily - 7 x weekly - 3 sets - 10 reps - Standing Tandem Balance with Counter Support  - 1 x daily - 7 x weekly - 1 sets - 1 reps - 1 min hold - Single Leg Stance  - 1 x daily - 7 x weekly - 1 sets - 1 reps - 1 min hold - [place] with Counter Support  - 1 x daily - 7 x weekly - 3 sets - 10 reps - Tandem Walking with Counter Support  - 1 x daily - 7 x weekly - 3 sets - 10 reps - Backward Tandem Walking with Counter Support  - 1 x daily - 7 x weekly - 3 sets - 10 reps - Sidelying Reverse Clamshell  - 1 x daily - 7 x weekly - 2 sets - 10 reps - Clamshell with Resistance  - 1 x daily - 7 x weekly - 2 sets - 10 reps  Patient Education - What You Can Do to Prevent Falls   ASSESSMENT:  CLINICAL IMPRESSION: Patient has been seen x 2 months PT for falls, unsteady gait, cognitive changes.  He has made good progress and met most of his goals for therapy.  However, he still c/o that he has difficulty carrying things up/down his steps at home to his office.  He also reports difficulty getting up from the floor once he sits down on the floor.   He would benefit from another 1 month of PT to address weakness in his legs, which has improved overall but is still not 5/5.  Also needs further PT with focus on carrying  objects on level and also on unlevel ground.  PT remains necessary for residual balance, strength, safety deficits.   Continue 1x/week for 4 more weeks.   Orders are sent for signature today to continue PT  Bobby [name] is a 73 y.o. male who was referred to physical therapy for evaluation and treatment for unsteady gait and falls.  Patient presents with physical impairments of impaired activity tolerance, impaired standing balance, impaired ambulation, and decreased safety awareness impacting safe and independent functional mobility.  He covers for his poor overall balance by ambulating and transferring very quickly.   However, when he is asked to walk and do activities more slowly with better control, he looses balance much more easily.   Timed tests do not show balance deficits as follows:  Gait speed 3.11 ft/sec, (2.62 ft/sec is needed for community access),  TUG of 7 sec (>13.5 sec indicates increased risk for falls), and 5xSTS of 9.75 sec (>15 sec indicates increased risk for falls and decreased BLE power).  However, his FGA score is 19/30 indicating a definite fall risk.   ABC scale score of 66.9% indicates a moderate level of physical functioning, but I think he overestimates his abilities and/or has decreased insight into his actual deficits.  HE is advised to use at least a  cane for safety.    Bobby [name] will benefit from skilled PT to address above deficits to improve mobility and activity tolerance to help reach the maximal level of functional independence and mobility. Patient demonstrates understanding of this POC and is in agreement with this plan.   OBJECTIVE IMPAIRMENTS: Abnormal gait, difficulty walking, decreased safety  awareness, and impaired perceived functional ability.   ACTIVITY LIMITATIONS: carrying, bending, stairs, and locomotion level  PARTICIPATION LIMITATIONS: laundry, shopping, and community activity  PERSONAL FACTORS: Age, Time since onset of  injury/illness/exacerbation, and 1-2 comorbidities:   L4/5 TLIF [facility] 6/24; CLL, HTN, memory deficit are also affecting patient's functional outcome.   REHAB POTENTIAL: Good  CLINICAL DECISION MAKING: Evolving/moderate complexity  EVALUATION COMPLEXITY: Moderate   GOALS: Goals reviewed with patient? Yes  SHORT TERM GOALS: Target date: 09/06/24   Patient will demonstrate sufficient BLE strength to be able to go from standing to kneeling lunge position and return to stand independently to be able to transfer independently floor to stand Baseline: PT assist required;  has to hold onto a treatment table Goal status: INITIAL 2.  Patient will be able to ambulate independently on unlevel outdoor surfaces   Baseline:  TBD  Goal status:  IN PROGRESS  LONG TERM GOALS: Target date: 09/20/2024   Patient will demonstrate improved BLE strength to >/= 5/5 for improved stability and ease of mobility . Baseline: Refer to above LE MMT table Goal status: IN PROGRESS- 08/19/24 see table    2.     Cognitive TUG will improve to </= 10 sec to decrease fall risk   Baseline:  counting backward = 12.3 sec     Carrying full glass water = 11.3 sec   Goal status:  INITIAL   3.  Patient will be able to carry a box weighing 4-5 lbs up/down 1 flight of steps safely for home stair/IADL ability   Baseline:  TBD   Goal Status:  INITIAL on 08/19/24   PLAN:  PT FREQUENCY: 1-2x/week  PT DURATION: 8 weeks  PLANNED INTERVENTIONS: 97164- PT Re-evaluation, 97750- Physical Performance Testing, 97110-Therapeutic exercises, 97530- Therapeutic activity, [id number]- Neuromuscular re-education, 97535- Self Care, [id number]- Manual therapy, [id number]- Gait training, [phone]- Electrical stimulation (unattended), 97016- Vasopneumatic device, [id number]- Ultrasound, [id number]- Traction (mechanical), 20560 (1-2 muscles), 20561 (3+ muscles)- Dry Needling, Patient/Family education, Balance training, Stair training, Taping, Joint mobilization,  Cryotherapy, and Moist heat  PLAN FOR NEXT SESSION: asses ability to carry object(s) up/down 1 flight steps, work on balance on level ground carrying things, lunges/floor to stand transfers  Bobby [name], PT 08/19/2024, 2:36 PM

## 2024-08-20 ENCOUNTER — Ambulatory Visit

## 2024-08-26 ENCOUNTER — Ambulatory Visit: Attending: Neurology

## 2024-08-26 DIAGNOSIS — M5459 Other low back pain: Secondary | ICD-10-CM | POA: Insufficient documentation

## 2024-08-26 DIAGNOSIS — M6281 Muscle weakness (generalized): Secondary | ICD-10-CM | POA: Diagnosis not present

## 2024-08-26 DIAGNOSIS — M48062 Spinal stenosis, lumbar region with neurogenic claudication: Secondary | ICD-10-CM | POA: Diagnosis not present

## 2024-08-26 DIAGNOSIS — R2689 Other abnormalities of gait and mobility: Secondary | ICD-10-CM | POA: Diagnosis not present

## 2024-08-26 NOTE — Therapy (Signed)
 OUTPATIENT PHYSICAL THERAPY NEURO TREATMENT   Patient Name: Bobby [name] MRN: [id number] DOB:[birth year], 73 y.o., male Today's Date: 08/26/2024   END OF SESSION:  PT End of Session - 08/26/24 1724     Visit Number 12    Authorization Type aetna medicare    PT Start Time 1618    PT Stop Time 1700    PT Time Calculation (min) 42 min    Activity Tolerance Patient tolerated treatment well;No increased pain    Behavior During Therapy [facility] for tasks assessed/performed               Past Medical History:  Diagnosis Date   Anxiety and depression    Hypertension    Hyponatremia 03/2013   ongoing, seeing Dr. Tobie   Insomnia    Melanoma in situ of back [facility])    biopsy on 03/05/2020   SIADH (syndrome of inappropriate ADH production) 2017   Dr. Tobie   Squamous cell carcinoma in situ    Past Surgical History:  Procedure Laterality Date   BRONCHIAL BIOPSY  11/27/2022   Procedure: BRONCHIAL BIOPSIES;  Surgeon: Shelah Lamar RAMAN, MD;  Location: [facility] ENDOSCOPY;  Service: Cardiopulmonary;;   BRONCHIAL BRUSHINGS  11/27/2022   Procedure: BRONCHIAL BRUSHINGS;  Surgeon: Shelah Lamar RAMAN, MD;  Location: [facility] ENDOSCOPY;  Service: Cardiopulmonary;;   BRONCHIAL WASHINGS  11/27/2022   Procedure: BRONCHIAL WASHINGS;  Surgeon: Shelah Lamar RAMAN, MD;  Location: [name];  Service: Cardiopulmonary;;   MOHS SURGERY  ~ 04/2020   face, [facility]   SHOULDER SURGERY  1980s   right shoulder   TRANSFORAMINAL LUMBAR INTERBODY FUSION (TLIF) WITH PEDICLE SCREW FIXATION 1 LEVEL Left 05/17/2023   Procedure: LEFT-SIDED LUMBAR 4- LUMBAR 5 TRANSFORAMINAL LUMBAR INTERBODY FUSION AND DECOMPRESSION WITH INSTRUMENTATION AND ALLOGRAFT;  Surgeon: Beuford Anes, MD;  Location: [facility] OR;  Service: Orthopedics;  Laterality: Left;   VIDEO BRONCHOSCOPY N/A 11/27/2022   Procedure: VIDEO BRONCHOSCOPY WITH FLUORO;  Surgeon: Shelah Lamar RAMAN, MD;  Location: [facility] ENDOSCOPY;  Service: Cardiopulmonary;  Laterality: N/A;   Patient Active  Problem List   Diagnosis Date Noted   Radiculopathy of lumbar region 05/17/2023   CLL (chronic lymphocytic leukemia) (HCC) 02/21/2023   Abnormal CT of the chest 11/01/2022   Melanoma in situ of back (HCC) 03/16/2020   Squamous cell carcinoma in situ 08/21/2019   Vitamin D  deficiency 05/29/2019   PCP NOTES >>>> 09/28/2015   Hypogonadism in male 05/13/2015   SIADH (syndrome of inappropriate ADH production) (HCC) 04/13/2013   MCI (mild cognitive impairment) with memory loss 04/13/2013   Alcohol abuse 04/13/2013   Annual physical exam 03/06/2012   Idiopathic scoliosis and kyphoscoliosis 08/18/2010   Anxiety state 04/01/2009   Essential hypertension, benign 01/04/2009   Insomnia 09/08/2008    PCP: Amon Aloysius BRAVO, MD   REFERRING PROVIDER: Tobie Tonita POUR, DO   REFERRING DIAG: R26.81 (ICD-10-CM) - Unsteady gait R41.89 (ICD-10-CM) - Cognitive changes  THERAPY DIAG:  Other abnormalities of gait and mobility  Muscle weakness (generalized)  Other low back pain  RATIONALE FOR EVALUATION AND TREATMENT: Rehabilitation  ONSET DATE: prrogressive over last 6 months  NEXT MD VISIT: after brain MRI   SUBJECTIVE:  SUBJECTIVE STATEMENT: Patient states feeling better today.  Denies any falls.   A little c/o back pain, but he is using his inversion table  EVAL: 73 y/o male referred to PT from Dr Tobie for unsteady gait and falls.  States he has gotten progressively more unsteady over last 6 months for no apparent reason.  He had therapy here a year ago following a lumbar fusion and did quite well.  He states he really has very little back pain or trouble with his back now.   Balance is his chief c/o.    Neurology notes indicate that they feel his unsteadiness may be residual effects of the back surgery  and residual canal stenosis.  They did not feel he has Parkinson's.   However, he is going to be worked up more for his memory deficits and brain MRI is pending.  Patient reports Having difficulty walking up/down steps carrying things up to the second floor of his home and is generally unsteady going out places.  He has difficulty verbalizing his exact symptoms due to what seems to be cognitive issues  Pt accompanied by: self  PAIN: Are you having pain? Yes: NPRS scale: 0/10 now;  intermittent R lateral thigh pain Pain location: R lateral thigh Pain description: aching Aggravating factors: walking >10 min Relieving factors: standing, not walking  PERTINENT HISTORY:  L4/5 TLIF [place] 6/24; CLL, HTN, memory deficit  PRECAUTIONS: Fall  RED FLAGS: None  WEIGHT BEARING RESTRICTIONS: No  FALLS:  Has patient fallen in last 6 months? Yes. Number of falls 0  LIVING ENVIRONMENT: Lives with: lives with their family and lives with their spouse Lives in: House/apartment Stairs: Yes: Internal: 4 steps; can reach both and External: 1 flight steps; on right going up Has following equipment at home: walking stick when going fishing  OCCUPATION: still rents office space out;  owns a building  PLOF: Independent with gait  PATIENT GOALS: improve my balance    OBJECTIVE: (objective measures completed at initial evaluation unless otherwise dated)  DIAGNOSTIC FINDINGS:  Out-side paper records, electronic medical record, and images have been reviewed where available and summarized as:  MRI lumbar spine 02/10/2023: 1. At L4-5 there is a broad-based disc bulge. Severe bilateral facet arthropathy and ligamentum flavum infolding. Severe spinal stenosis. No left foraminal stenosis. Severe right foraminal stenosis. 2. At L5-S1 there is a broad-based disc osteophyte complex. Moderate bilateral facet arthropathy with a small right facet effusion. Severe left foraminal stenosis. Mild right foraminal  stenosis. 3. At L3-4 there is a broad-based disc bulge. Moderate bilateral facet arthropathy. Mild spinal stenosis. Moderate right and mild left foraminal stenosis. 4. No acute osseous injury of the lumbar spine.   MRI cervical spine 02/10/2023: 1. At T1-2 there is a minimal broad-based disc osteophyte complex. Moderate-severe bilateral foraminal stenosis. 2. No acute osseous injury of the thoracic spine. 3. Partially visualized cervical spine spondylosis. If there is further clinical concern, recommend a dedicated MRI of the cervical spine.  COGNITION: Overall cognitive status: Impaired   SENSATION: WFL  COORDINATION: Finger to nose is slightly impaired  EDEMA:  None noted  MUSCLE TONE: WNL  DTRs:  NT   POSTURE:  decreased lumbar lordosis, tends to slouch  MUSCLE LENGTH: Hamstrings: moderate tightness BLE ITB: slight tight R Piriformis: slight tight BLE Hip flexors: NT  LOWER EXTREMITY ROM:     Active  Right eval Left eval  Hip flexion    Hip extension    Hip abduction  Hip adduction    Hip internal rotation    Hip external rotation    Knee flexion    Knee extension    Ankle dorsiflexion    Ankle plantarflexion    Ankle inversion    Ankle eversion     (Blank rows = not tested)  LOWER EXTREMITY MMT:    MMT Right eval Left eval R 08/06/24 L  08/06/24 RLE 08/19/24 LLE 08/19/24  Hip flexion     5 5  Hip extension     4- 4-  Hip abduction     4- 4-  Hip adduction        Hip internal rotation 4- 4 4 4  4+ 4+  Hip external rotation 4+ 4 4 4+ 4+ 4+  Knee flexion 5 5   5 5   Knee extension 5 5   5 5   Ankle dorsiflexion 4+ 4   5 4+  Ankle plantarflexion     4 5  Ankle inversion        Ankle eversion        (Blank rows = not tested)  BED MOBILITY:  Independent in all aspects  TRANSFERS: Assistive device utilized: None  Sit to stand: SBA Stand to sit: Complete Independence Chair to chair: SBA Floor: NT  GAIT: Distance walked: 150' in  clinic Assistive device utilized: None Level of assistance: CGA Gait pattern: staggering gait, frequent losses of balance but can correct or catch himself Comments:   STAIRS:  Level of Assistance: SBA  Stair Negotiation Technique: Alternating Pattern  with Single Rail on Right  Number of Stairs: 12    Height of Stairs: 7  Comments: goes quickly to compensate for lack of balance  FUNCTIONAL TESTS:  FGA = 17 5X STS = 9.75 sec TUG = 7 sec Gait speed = 3.11 ft/sec  08/19/24: TUG counting backward = 12.3 sec;  has 2 losses of balance TUG Carrying full glass water = 11.3 sec;  unable to keep water from spilling  PATIENT SURVEYS:  ABC = 66.9%   TODAY'S TREATMENT:  08/26/24 Bike L3x20min Step ups and down 5lb weight to side x 10 Stair climbing and descending with 5lb weight intermittent use of handrail  One arm farmer carry 15lb around clinic   Lunges touchcing the floor x 10  Lateral lunges 2x10  08/19/24 THERAPEUTIC EXERCISE: To improve strength.  Demonstration, verbal and tactile cues throughout for technique. Bike L 4 x 8'  NEUROMUSCULAR RE-EDUCATION: To improve balance, posture, and proprioception. Aerobic step w/ airex pad lateral step up and across x 20 B Stand to 1/2 kneel on airex pad x 2/5 BLE Checked cognitive TUG and carrying water TUG  THERAPEUTIC ACTIVITIES: To improve functional performance.  Demonstration, verbal and tactile cues throughout for technique. Floor to stand transfer x 2 w/ CGA for proper technique rather than trying to stand from a side sit position pushing on hands Rechecked strength Wall squats w/ blue TB x 3/10 BLE   08/15/24 Bike L3x56min Resisted gait 4 way with black TB 3x10' each way Clock reaches R/L x 5 12 to 6 o'clock- intermittent UE support Sidesteps on foam beam 4x each direction Tandem walk on foam beam 4x each direction  FGA- 23/30- 4 points higher than before ABC scale: 1560 / 1600 = 97.5 %  PATIENT EDUCATION:  Education  details: proper floor transfer technique, adding lunges to HEP  Person educated: Patient and Spouse Education method: Explanation, Demonstration, Verbal cues, Tactile cues, and MedBridgeGO app access  provided Education comprehension: verbalized understanding, verbal cues required, tactile cues required, and needs further education  HOME EXERCISE PROGRAM: Access Code: [email or web address] URL: [email or web address] Date: 08/06/2024 Prepared by: Stacy Sailer  Exercises - Toe Raises with Unilateral Counter Support  - 1 x daily - 7 x weekly - 3 sets - 10 reps - Heel Raises with Counter Support  - 1 x daily - 7 x weekly - 3 sets - 10 reps - Mini Squat with Counter Support  - 1 x daily - 7 x weekly - 3 sets - 10 reps - Standing Tandem Balance with Counter Support  - 1 x daily - 7 x weekly - 1 sets - 1 reps - 1 min hold - Single Leg Stance  - 1 x daily - 7 x weekly - 1 sets - 1 reps - 1 min hold - Carioca with Counter Support  - 1 x daily - 7 x weekly - 3 sets - 10 reps - Tandem Walking with Counter Support  - 1 x daily - 7 x weekly - 3 sets - 10 reps - Backward Tandem Walking with Counter Support  - 1 x daily - 7 x weekly - 3 sets - 10 reps - Sidelying Reverse Clamshell  - 1 x daily - 7 x weekly - 2 sets - 10 reps - Clamshell with Resistance  - 1 x daily - 7 x weekly - 2 sets - 10 reps  Patient Education - What You Can Do to Prevent Falls   ASSESSMENT:  CLINICAL IMPRESSION: Patient has been seen x 2 months PT for falls, unsteady gait, cognitive changes.  He has made good progress and met most of his goals for therapy. Shows deficits with carry 5lb weight unilateral descending>ascending stairs, resorting to step to pattern. Worked on lunges to improve ability to kneel and perform floor transfers. PT remains necessary for residual balance, strength, safety deficits.   Continue 1x/week for 4 more weeks.     Bobby [name] is a 73 y.o. male who was referred to physical therapy for  evaluation and treatment for unsteady gait and falls.  Patient presents with physical impairments of impaired activity tolerance, impaired standing balance, impaired ambulation, and decreased safety awareness impacting safe and independent functional mobility.  He covers for his poor overall balance by ambulating and transferring very quickly.   However, when he is asked to walk and do activities more slowly with better control, he looses balance much more easily.   Timed tests do not show balance deficits as follows:  Gait speed 3.11 ft/sec, (2.62 ft/sec is needed for community access),  TUG of 7 sec (>13.5 sec indicates increased risk for falls), and 5xSTS of 9.75 sec (>15 sec indicates increased risk for falls and decreased BLE power).  However, his FGA score is 19/30 indicating a definite fall risk.   ABC scale score of 66.9% indicates a moderate level of physical functioning, but I think he overestimates his abilities and/or has decreased insight into his actual deficits.  HE is advised to use at least a  cane for safety.    Bobby [name] will benefit from skilled PT to address above deficits to improve mobility and activity tolerance to help reach the maximal level of functional independence and mobility. Patient demonstrates understanding of this POC and is in agreement with this plan.   OBJECTIVE IMPAIRMENTS: Abnormal gait, difficulty walking, decreased safety awareness, and impaired perceived functional ability.   ACTIVITY LIMITATIONS: carrying, bending, stairs, and locomotion level  PARTICIPATION LIMITATIONS: laundry, shopping, and community activity  PERSONAL FACTORS: Age, Time since onset of injury/illness/exacerbation, and 1-2 comorbidities:   L4/5 TLIF [facility] 6/24; CLL, HTN, memory deficit are also affecting patient's functional outcome.   REHAB POTENTIAL: Good  CLINICAL DECISION MAKING: Evolving/moderate complexity  EVALUATION COMPLEXITY: Moderate   GOALS: Goals reviewed with  patient? Yes  SHORT TERM GOALS: Target date: 09/06/24   Patient will demonstrate sufficient BLE strength to be able to go from standing to kneeling lunge position and return to stand independently to be able to transfer independently floor to stand Baseline: PT assist required;  has to hold onto a treatment table Goal status: INITIAL 2.  Patient will be able to ambulate independently on unlevel outdoor surfaces   Baseline:  TBD  Goal status:  IN PROGRESS  LONG TERM GOALS: Target date: 09/20/2024   Patient will demonstrate improved BLE strength to >/= 5/5 for improved stability and ease of mobility . Baseline: Refer to above LE MMT table Goal status: IN PROGRESS- 08/19/24 see table    2.     Cognitive TUG will improve to </= 10 sec to decrease fall risk   Baseline:  counting backward = 12.3 sec     Carrying full glass water = 11.3 sec   Goal status:  INITIAL   3.  Patient will be able to carry a box weighing 4-5 lbs up/down 1 flight of steps safely for home stair/IADL ability   Baseline:  TBD   Goal Status:  INITIAL on 08/19/24   PLAN:  PT FREQUENCY: 1-2x/week  PT DURATION: 8 weeks  PLANNED INTERVENTIONS: 97164- PT Re-evaluation, 97750- Physical Performance Testing, 97110-Therapeutic exercises, 97530- Therapeutic activity, [id number]- Neuromuscular re-education, 97535- Self Care, 02859- Manual therapy, [phone]- Gait training, [phone]- Electrical stimulation (unattended), 97016- Vasopneumatic device, [id number]- Ultrasound, [id number]- Traction (mechanical), 20560 (1-2 muscles), 20561 (3+ muscles)- Dry Needling, Patient/Family education, Balance training, Stair training, Taping, Joint mobilization, Cryotherapy, and Moist heat  PLAN FOR NEXT SESSION: work on balance on level ground carrying things, lunges/floor to stand transfers  [employer], PTA 08/26/2024, 5:27 PM

## 2024-08-27 ENCOUNTER — Ambulatory Visit: Admitting: Internal Medicine

## 2024-08-27 ENCOUNTER — Encounter: Payer: Self-pay | Admitting: Internal Medicine

## 2024-08-27 ENCOUNTER — Ambulatory Visit (INDEPENDENT_AMBULATORY_CARE_PROVIDER_SITE_OTHER): Admitting: Internal Medicine

## 2024-08-27 VITALS — BP 122/82 | HR 50 | Temp 98.1°F | Resp 16 | Ht 72.0 in | Wt 174.5 lb

## 2024-08-27 DIAGNOSIS — F411 Generalized anxiety disorder: Secondary | ICD-10-CM

## 2024-08-27 DIAGNOSIS — F1010 Alcohol abuse, uncomplicated: Secondary | ICD-10-CM

## 2024-08-27 DIAGNOSIS — Z79899 Other long term (current) drug therapy: Secondary | ICD-10-CM

## 2024-08-27 DIAGNOSIS — E222 Syndrome of inappropriate secretion of antidiuretic hormone: Secondary | ICD-10-CM

## 2024-08-27 DIAGNOSIS — R739 Hyperglycemia, unspecified: Secondary | ICD-10-CM | POA: Diagnosis not present

## 2024-08-27 DIAGNOSIS — G3184 Mild cognitive impairment, so stated: Secondary | ICD-10-CM

## 2024-08-27 DIAGNOSIS — G4700 Insomnia, unspecified: Secondary | ICD-10-CM

## 2024-08-27 NOTE — Patient Instructions (Addendum)
 GO TO THE LAB :  Get the blood work    Then, go to the front desk for the checkout Please make an appointment for a physical exam by April 2026       Check the  blood pressure regularly Blood pressure goal:  between 110/65 and  135/85. If it is consistently higher or lower, let me know    Please read more detailed instructions below   FATTY LIVER DISEASE: You have been diagnosed with fatty liver disease and have significantly reduced your alcohol consumption. -Continue to avoid alcoholic beverages and stick to non-alcoholic options.  GAIT DISTURBANCE AND FALL: You have persistent unsteadiness despite back surgery and continue to experience falls. -Continue with physical therapy to improve your gait.  MILD COGNITIVE IMPAIRMENT: You have occasional word-finding difficulties but no significant changes in cognitive function recently. -Monitor your cognitive function and report any significant changes.  ANXIETY AND DEPRESSION: Your symptoms are managed with clonazepam , which is effective at the current dose. -Continue taking clonazepam  as prescribed.  INSOMNIA: You have minimal use of Ambien , with only two doses in the last month. -Continue to use Ambien  sparingly as needed.  HYPONATREMIA: You have a history of low sodium levels but no recent episodes. -A Basic Metabolic Panel (BMP) and Thyroid -Stimulating Hormone (TSH) test have been ordered to monitor your condition.  ADULT WELLNESS VISIT: Your blood pressure is well-controlled and your preventive care is up to date. -Schedule your next physical exam in April.

## 2024-08-27 NOTE — Progress Notes (Signed)
 "  Subjective:    Patient ID: Bobby [name], male    DOB: 1951-03-08, 73 y.o.   MRN: [id number]  DOS:  08/27/2024 Follow-up, here with his wife Discussed the use of AI scribe software for clinical note transcription with the patient, who gave verbal consent to proceed.  History of Present Illness  Hepatic steatosis - Diagnosed with fatty liver disease - Significantly reduced alcohol consumption since around Labor Day - Currently consumes only non-alcoholic beer and wine - Consumed two alcoholic beers in the past six weeks  Gait disturbance and fall - Persistent unsteadiness despite back surgery last year - Continues physical therapy for gait issues - Experienced a mecanichal fall while fishing - No head injury or loss of consciousness with the fall  Cognitive changes - Occasional word retrieval difficulties, such as confusing 'binoculars' with 'goggles' - No significant changes in cognitive function in the past three weeks  Hyponatremia - History of hyponatremia  Medication use - Current medications include atorvastatin , carvedilol , and clonazepam  - Minimal use of Ambien     Review of Systems See above   Past Medical History:  Diagnosis Date   Anxiety and depression    Hypertension    Hyponatremia 03/2013   ongoing, seeing Dr. Tobie   Insomnia    Melanoma in situ of back [facility])    biopsy on 03/05/2020   SIADH (syndrome of inappropriate ADH production) 2017   Dr. Tobie   Squamous cell carcinoma in situ     Past Surgical History:  Procedure Laterality Date   BRONCHIAL BIOPSY  11/27/2022   Procedure: BRONCHIAL BIOPSIES;  Surgeon: Shelah Lamar RAMAN, MD;  Location: [facility] ENDOSCOPY;  Service: Cardiopulmonary;;   BRONCHIAL BRUSHINGS  11/27/2022   Procedure: BRONCHIAL BRUSHINGS;  Surgeon: Shelah Lamar RAMAN, MD;  Location: [facility] ENDOSCOPY;  Service: Cardiopulmonary;;   BRONCHIAL WASHINGS  11/27/2022   Procedure: BRONCHIAL WASHINGS;  Surgeon: Shelah Lamar RAMAN, MD;  Location: [facility]  ENDOSCOPY;  Service: Cardiopulmonary;;   MOHS SURGERY  ~ 04/2020   face, [facility]   SHOULDER SURGERY  1980s   right shoulder   TRANSFORAMINAL LUMBAR INTERBODY FUSION (TLIF) WITH PEDICLE SCREW FIXATION 1 LEVEL Left 05/17/2023   Procedure: LEFT-SIDED LUMBAR 4- LUMBAR 5 TRANSFORAMINAL LUMBAR INTERBODY FUSION AND DECOMPRESSION WITH INSTRUMENTATION AND ALLOGRAFT;  Surgeon: Beuford Anes, MD;  Location: [facility] OR;  Service: Orthopedics;  Laterality: Left;   VIDEO BRONCHOSCOPY N/A 11/27/2022   Procedure: VIDEO BRONCHOSCOPY WITH FLUORO;  Surgeon: Shelah Lamar RAMAN, MD;  Location: [facility] ENDOSCOPY;  Service: Cardiopulmonary;  Laterality: N/A;    Current Outpatient Medications  Medication Instructions   atorvastatin  (LIPITOR) 20 mg, Oral, Daily at bedtime   b complex vitamins capsule 1 capsule, Daily   carvedilol  (COREG ) 6.25 MG tablet TAKE 1 TABLET BY MOUTH TWICE A DAY WITH A MEAL   cholecalciferol  (VITAMIN D3) 1,000 Units, Daily   clonazePAM  (KLONOPIN ) 0.5 MG tablet TAKE 1 TABLET BY MOUTH 2 TIMES A DAY AS NEEDED FOR ANXIETY   folic acid  (FOLVITE ) 1 mg, Oral, Daily   Melatonin 10 mg, Daily at bedtime   Multiple Vitamin (MULTIVITAMIN WITH MINERALS) TABS 1 tablet, Oral, Daily   niacinamide 500 mg, 2 times daily with meals   Pseudoeph-Doxylamine-DM-APAP (NYQUIL PO) Take by mouth. 30 mls q hs.   tadalafil  (CIALIS ) 10 MG tablet TAKE 1-2 TABLETS BY MOUTH EVERY OTHER DAY AS NEEDED   zolpidem  (AMBIEN ) 5 mg, Oral, At bedtime PRN       Objective:   Physical Exam BP 122/82  Pulse (!) 50   Temp 98.1 F (36.7 C) (Oral)   Resp 16   Ht 6' (1.829 m)   Wt 174 lb 8 oz (79.2 kg)   SpO2 98%   BMI 23.67 kg/m  General:   Well developed, NAD, BMI noted. HEENT:  Normocephalic . Face symmetric, atraumatic Lungs:  CTA B Normal respiratory effort, no intercostal retractions, no accessory muscle use. Heart: RRR,  no murmur.  Lower extremities: no pretibial edema bilaterally  Skin: Not pale. Not jaundice Neurologic:   alert & oriented to self, time, place. Speech normal, gait unassisted, slightly unsteady  Psych--  Cognition and judgment appear intact.  Cooperative with normal attention span and concentration.  Behavior appropriate. No anxious or depressed appearing.      Assessment     Assessment  HTN Anxiety, depression, insomnia : used lexapro , d/c (decreased libido) Daily etoh use  Hypogonadotrophic Hypogonadism: per  Dr Von , used HRT at some point [facility]: Per neuro visit 12/2017.  MoCA score 2025: 21 Hyponatremia onset 2014, admitted. Dx SIADH (renal 12/2015:RX to watch fluid intake and sx -gait d/o, MS changes) H/o vit D def Oncology: - SCC: DX 07-2019 -Melanoma dx 02/2020 (R upper back);  ~12-2021nose-Moh's (per pt)  -CLL Dx 2023  Abnormal CT chest, bronchoscopy 11-2022, BX negative for malignant .   Next CT May 2024 Dermatology- on Dupixent    Assessment & Plan Fatty liver disease Liver US   done 06/2024, showed fatty liver and multiple hepatic cysts.  Results were discussed with radiology consistent benign cysts, no imaging follow-up needed.  LFTs are normal.    Explained the patient and his wife that this is a condition likely related to alcohol, he already stopped drinking and the likelihood to this get worse or become serious is very small Alcohol abuse: Today he is here with his wife, she reports that for the last 35 years he has been a heavy drinker and she =knows [name] is an alcoholic;  fortunately he has stopped alcohol since early September, now drinking nonalcoholic wine and beer.  Praised.  Mild cognitive impairment Stable cognitive status with occasional word-finding difficulties.  Has a pending appointment with neurology for November, MRI brain 06/25/2024 no acute. Gait disorder Persistent unsteady walking despite physical therapy and improved pain post-back surgery. Cont physical therapy Anxiety, insomnia Symptoms managed with clonazepam  half tablet twice a day, effective at  current dose. Minimal use of Ambien  with only two doses in the last month. Check UDS SIADH: Monitor sodium with a BMP.  Also check a TSH Preventive care: Had a flu and a COVID booster. RTC April CPX       "

## 2024-08-28 ENCOUNTER — Ambulatory Visit: Payer: Self-pay | Admitting: Internal Medicine

## 2024-08-28 LAB — TSH: TSH: 1.41 u[IU]/mL (ref 0.35–5.50)

## 2024-08-28 LAB — BASIC METABOLIC PANEL WITH GFR
BUN: 11 mg/dL (ref 6–23)
CO2: 28 meq/L (ref 19–32)
Calcium: 8.9 mg/dL (ref 8.4–10.5)
Chloride: 99 meq/L (ref 96–112)
Creatinine, Ser: 0.83 mg/dL (ref 0.40–1.50)
GFR: 85.69 mL/min (ref >60.00)
Glucose, Bld: 89 mg/dL (ref 70–99)
Potassium: 4.4 meq/L (ref 3.5–5.1)
Sodium: 135 meq/L (ref 135–145)

## 2024-08-28 LAB — HEMOGLOBIN A1C: Hgb A1c MFr Bld: 5.9 % (ref 4.6–6.5)

## 2024-08-28 NOTE — Assessment & Plan Note (Signed)
 Fatty liver disease Liver US   done 06/2024, showed fatty liver and multiple hepatic cysts.  Results were discussed with radiology consistent benign cysts, no imaging follow-up needed.  LFTs are normal.    Explained the patient and his wife that this is a condition likely related to alcohol, he already stopped drinking and the likelihood to this get worse or become serious is very small Alcohol abuse: Today he is here with his wife, she reports that for the last 35 years he has been a heavy drinker and she =knows [name] is an alcoholic;  fortunately he has stopped alcohol since early September, now drinking nonalcoholic wine and beer.  Praised.  Mild cognitive impairment Stable cognitive status with occasional word-finding difficulties.  Has a pending appointment with neurology for November, MRI brain 06/25/2024 no acute. Gait disorder Persistent unsteady walking despite physical therapy and improved pain post-back surgery. Cont physical therapy Anxiety, insomnia Symptoms managed with clonazepam  half tablet twice a day, effective at current dose. Minimal use of Ambien  with only two doses in the last month. Check UDS SIADH: Monitor sodium with a BMP.  Also check a TSH Preventive care: Had a flu and a COVID booster. RTC April CPX

## 2024-08-29 LAB — DRUG MONITORING PANEL 375977 , URINE

## 2024-08-29 LAB — DM TEMPLATE

## 2024-09-02 ENCOUNTER — Ambulatory Visit

## 2024-09-02 ENCOUNTER — Encounter: Admitting: Psychology

## 2024-09-04 ENCOUNTER — Ambulatory Visit

## 2024-09-04 DIAGNOSIS — M5459 Other low back pain: Secondary | ICD-10-CM | POA: Diagnosis not present

## 2024-09-04 DIAGNOSIS — M48062 Spinal stenosis, lumbar region with neurogenic claudication: Secondary | ICD-10-CM | POA: Diagnosis not present

## 2024-09-04 DIAGNOSIS — R2689 Other abnormalities of gait and mobility: Secondary | ICD-10-CM | POA: Diagnosis not present

## 2024-09-04 DIAGNOSIS — M6281 Muscle weakness (generalized): Secondary | ICD-10-CM | POA: Diagnosis not present

## 2024-09-04 NOTE — Therapy (Signed)
 OUTPATIENT PHYSICAL THERAPY NEURO TREATMENT   Patient Name: Bobby [name] MRN: [id number] DOB:1951-04-18, 73 y.o., male Today's Date: 09/04/2024   END OF SESSION:  PT End of Session - 09/04/24 0920     Visit Number 13    Authorization Type [employer]etna medicare    PT Start Time 0848    PT Stop Time 0935    PT Time Calculation (min) 47 min    Activity Tolerance Patient tolerated treatment well;No increased pain    Behavior During Therapy [facility] for tasks assessed/performed                Past Medical History:  Diagnosis Date   Anxiety and depression    Hypertension    Hyponatremia 03/2013   ongoing, seeing Dr. Tobie   Insomnia    Melanoma in situ of back [facility])    biopsy on 03/05/2020   SIADH (syndrome of inappropriate ADH production) 2017   Dr. Tobie   Squamous cell carcinoma in situ    Past Surgical History:  Procedure Laterality Date   BRONCHIAL BIOPSY  11/27/2022   Procedure: BRONCHIAL BIOPSIES;  Surgeon: Shelah Lamar RAMAN, MD;  Location: [facility] ENDOSCOPY;  Service: Cardiopulmonary;;   BRONCHIAL BRUSHINGS  11/27/2022   Procedure: BRONCHIAL BRUSHINGS;  Surgeon: Shelah Lamar RAMAN, MD;  Location: [facility] ENDOSCOPY;  Service: Cardiopulmonary;;   BRONCHIAL WASHINGS  11/27/2022   Procedure: BRONCHIAL WASHINGS;  Surgeon: Shelah Lamar RAMAN, MD;  Location: [name];  Service: Cardiopulmonary;;   MOHS SURGERY  ~ 04/2020   face, [facility]   SHOULDER SURGERY  1980s   right shoulder   TRANSFORAMINAL LUMBAR INTERBODY FUSION (TLIF) WITH PEDICLE SCREW FIXATION 1 LEVEL Left 05/17/2023   Procedure: LEFT-SIDED LUMBAR 4- LUMBAR 5 TRANSFORAMINAL LUMBAR INTERBODY FUSION AND DECOMPRESSION WITH INSTRUMENTATION AND ALLOGRAFT;  Surgeon: Beuford Anes, MD;  Location: [facility] OR;  Service: Orthopedics;  Laterality: Left;   VIDEO BRONCHOSCOPY N/A 11/27/2022   Procedure: VIDEO BRONCHOSCOPY WITH FLUORO;  Surgeon: Shelah Lamar RAMAN, MD;  Location: [facility] ENDOSCOPY;  Service: Cardiopulmonary;  Laterality: N/A;   Patient Active  Problem List   Diagnosis Date Noted   Radiculopathy of lumbar region 05/17/2023   CLL (chronic lymphocytic leukemia) (HCC) 02/21/2023   Abnormal CT of the chest 11/01/2022   Melanoma in situ of back (HCC) 03/16/2020   Squamous cell carcinoma in situ 08/21/2019   Vitamin D  deficiency 05/29/2019   PCP NOTES >>>> 09/28/2015   Hypogonadism in male 05/13/2015   SIADH (syndrome of inappropriate ADH production) (HCC) 04/13/2013   MCI (mild cognitive impairment) with memory loss 04/13/2013   Alcohol abuse 04/13/2013   Annual physical exam 03/06/2012   Idiopathic scoliosis and kyphoscoliosis 08/18/2010   Anxiety state 04/01/2009   Essential hypertension, benign 01/04/2009   Insomnia 09/08/2008    PCP: Amon Aloysius BRAVO, MD   REFERRING PROVIDER: Tobie Tonita POUR, DO   REFERRING DIAG: R26.81 (ICD-10-CM) - Unsteady gait R41.89 (ICD-10-CM) - Cognitive changes  THERAPY DIAG:  Other abnormalities of gait and mobility  Muscle weakness (generalized)  Other low back pain  RATIONALE FOR EVALUATION AND TREATMENT: Rehabilitation  ONSET DATE: prrogressive over last 6 months  NEXT MD VISIT: after brain MRI   SUBJECTIVE:  SUBJECTIVE STATEMENT: Carried a 25lb printer up/down stairs, felt pretty stable but hard when coming down  EVAL: 73 y/o male referred to PT from Dr Tobie for unsteady gait and falls.  States he has gotten progressively more unsteady over last 6 months for no apparent reason.  He had therapy here a year ago following a lumbar fusion and did quite well.  He states he really has very little back pain or trouble with his back now.   Balance is his chief c/o.    Neurology notes indicate that they feel his unsteadiness may be residual effects of the back surgery and residual canal stenosis.   They did not feel he has Parkinson's.   However, he is going to be worked up more for his memory deficits and brain MRI is pending.  Patient reports Having difficulty walking up/down steps carrying things up to the second floor of his home and is generally unsteady going out places.  He has difficulty verbalizing his exact symptoms due to what seems to be cognitive issues  Pt accompanied by: self  PAIN: Are you having pain? Yes: NPRS scale: 0/10 now;  intermittent R lateral thigh pain Pain location: R lateral thigh Pain description: aching Aggravating factors: walking >10 min Relieving factors: standing, not walking  PERTINENT HISTORY:  L4/5 TLIF [place] 6/24; CLL, HTN, memory deficit  PRECAUTIONS: Fall  RED FLAGS: None  WEIGHT BEARING RESTRICTIONS: No  FALLS:  Has patient fallen in last 6 months? Yes. Number of falls 0  LIVING ENVIRONMENT: Lives with: lives with their family and lives with their spouse Lives in: House/apartment Stairs: Yes: Internal: 4 steps; can reach both and External: 1 flight steps; on right going up Has following equipment at home: walking stick when going fishing  OCCUPATION: still rents office space out;  owns a building  PLOF: Independent with gait  PATIENT GOALS: improve my balance    OBJECTIVE: (objective measures completed at initial evaluation unless otherwise dated)  DIAGNOSTIC FINDINGS:  Out-side paper records, electronic medical record, and images have been reviewed where available and summarized as:  MRI lumbar spine 02/10/2023: 1. At L4-5 there is a broad-based disc bulge. Severe bilateral facet arthropathy and ligamentum flavum infolding. Severe spinal stenosis. No left foraminal stenosis. Severe right foraminal stenosis. 2. At L5-S1 there is a broad-based disc osteophyte complex. Moderate bilateral facet arthropathy with a small right facet effusion. Severe left foraminal stenosis. Mild right foraminal stenosis. 3. At L3-4 there  is a broad-based disc bulge. Moderate bilateral facet arthropathy. Mild spinal stenosis. Moderate right and mild left foraminal stenosis. 4. No acute osseous injury of the lumbar spine.   MRI cervical spine 02/10/2023: 1. At T1-2 there is a minimal broad-based disc osteophyte complex. Moderate-severe bilateral foraminal stenosis. 2. No acute osseous injury of the thoracic spine. 3. Partially visualized cervical spine spondylosis. If there is further clinical concern, recommend a dedicated MRI of the cervical spine.  COGNITION: Overall cognitive status: Impaired   SENSATION: WFL  COORDINATION: Finger to nose is slightly impaired  EDEMA:  None noted  MUSCLE TONE: WNL  DTRs:  NT   POSTURE:  decreased lumbar lordosis, tends to slouch  MUSCLE LENGTH: Hamstrings: moderate tightness BLE ITB: slight tight R Piriformis: slight tight BLE Hip flexors: NT  LOWER EXTREMITY ROM:     Active  Right eval Left eval  Hip flexion    Hip extension    Hip abduction    Hip adduction    Hip internal rotation  Hip external rotation    Knee flexion    Knee extension    Ankle dorsiflexion    Ankle plantarflexion    Ankle inversion    Ankle eversion     (Blank rows = not tested)  LOWER EXTREMITY MMT:    MMT Right eval Left eval R 08/06/24 L  08/06/24 RLE 08/19/24 LLE 08/19/24 R/L 09/04/24  Hip flexion     5 5   Hip extension     4- 4- 4+/4+  Hip abduction     4- 4- 4/4  Hip adduction         Hip internal rotation 4- 4 4 4  4+ 4+   Hip external rotation 4+ 4 4 4+ 4+ 4+   Knee flexion 5 5   5 5    Knee extension 5 5   5 5    Ankle dorsiflexion 4+ 4   5 4+   Ankle plantarflexion     4 5   Ankle inversion         Ankle eversion         (Blank rows = not tested)  BED MOBILITY:  Independent in all aspects  TRANSFERS: Assistive device utilized: None  Sit to stand: SBA Stand to sit: Complete Independence Chair to chair: SBA Floor: NT  GAIT: Distance walked: 150' in  clinic Assistive device utilized: None Level of assistance: CGA Gait pattern: staggering gait, frequent losses of balance but can correct or catch himself Comments:   STAIRS:  Level of Assistance: SBA  Stair Negotiation Technique: Alternating Pattern  with Single Rail on Right  Number of Stairs: 12    Height of Stairs: 7  Comments: goes quickly to compensate for lack of balance  FUNCTIONAL TESTS:  FGA = 17 5X STS = 9.75 sec TUG = 7 sec Gait speed = 3.11 ft/sec  08/19/24: TUG counting backward = 12.3 sec;  has 2 losses of balance TUG Carrying full glass water = 11.3 sec;  unable to keep water from spilling  PATIENT SURVEYS:  ABC = 66.9%   TODAY'S TREATMENT:  09/04/24 Bike L3x31min Tested LE strength Demo and review of HS stretch Sidelying clamshell RTB x 15 R/L Lateral step downs 6' x 20 R/L TRX squats x 20; deep squats x 5 Bridges RTB 10x3 Floor lunges review- R/L see clinical impression  08/26/24 Bike L3x53min Step ups and down 5lb weight to side x 10 Stair climbing and descending with 5lb weight intermittent use of handrail  One arm farmer carry 15lb around clinic   Lunges touchcing the floor x 10  Lateral lunges 2x10  08/19/24 THERAPEUTIC EXERCISE: To improve strength.  Demonstration, verbal and tactile cues throughout for technique. Bike L 4 x 8'  NEUROMUSCULAR RE-EDUCATION: To improve balance, posture, and proprioception. Aerobic step w/ airex pad lateral step up and across x 20 B Stand to 1/2 kneel on airex pad x 2/5 BLE Checked cognitive TUG and carrying water TUG  THERAPEUTIC ACTIVITIES: To improve functional performance.  Demonstration, verbal and tactile cues throughout for technique. Floor to stand transfer x 2 w/ CGA for proper technique rather than trying to stand from a side sit position pushing on hands Rechecked strength Wall squats w/ blue TB x 3/10 BLE   08/15/24 Bike L3x31min Resisted gait 4 way with black TB 3x10' each way Clock reaches  R/L x 5 12 to 6 o'clock- intermittent UE support Sidesteps on foam beam 4x each direction Tandem walk on foam beam 4x each direction  FGA- 23/30- 4 points higher than before ABC scale: 1560 / 1600 = 97.5 %  PATIENT EDUCATION:  Education details: proper floor transfer technique, adding lunges to HEP  Person educated: Patient and Spouse Education method: Explanation, Demonstration, Verbal cues, Tactile cues, and MedBridgeGO app access provided Education comprehension: verbalized understanding, verbal cues required, tactile cues required, and needs further education  HOME EXERCISE PROGRAM: Access Code: [email or web address] URL: [email or web address] Date: 09/04/2024 Prepared by: Georg Ang  Exercises - Toe Raises with Unilateral Counter Support  - 1 x daily - 7 x weekly - 3 sets - 10 reps - Heel Raises with Counter Support  - 1 x daily - 7 x weekly - 3 sets - 10 reps - Mini Squat with Counter Support  - 1 x daily - 7 x weekly - 3 sets - 10 reps - Standing Tandem Balance with Counter Support  - 1 x daily - 7 x weekly - 1 sets - 1 reps - 1 min hold - Single Leg Stance  - 1 x daily - 7 x weekly - 1 sets - 1 reps - 1 min hold - Carioca with Counter Support  - 1 x daily - 7 x weekly - 3 sets - 10 reps - Tandem Walking with Counter Support  - 1 x daily - 7 x weekly - 3 sets - 10 reps - Backward Tandem Walking with Counter Support  - 1 x daily - 7 x weekly - 3 sets - 10 reps - Sidelying Reverse Clamshell  - 1 x daily - 7 x weekly - 2 sets - 10 reps - Clamshell with Resistance  - 1 x daily - 7 x weekly - 2 sets - 10 reps - Seated Hamstring Stretch  - 1 x daily - 7 x weekly - 3 sets - 3 reps - 30 sec hold  Patient Education - What You Can Do to Prevent Falls   ASSESSMENT:  CLINICAL IMPRESSION: Focused on building LE strength in lateral hip for improved lumbar stability and to improve ability to get up/down from floor. He is currently unable to get up from floor w/o assistance. Will  continue to work on functional strength as able.   Bobby [name] is a 73 y.o. male who was referred to physical therapy for evaluation and treatment for unsteady gait and falls.  Patient presents with physical impairments of impaired activity tolerance, impaired standing balance, impaired ambulation, and decreased safety awareness impacting safe and independent functional mobility.  He covers for his poor overall balance by ambulating and transferring very quickly.   However, when he is asked to walk and do activities more slowly with better control, he looses balance much more easily.   Timed tests do not show balance deficits as follows:  Gait speed 3.11 ft/sec, (2.62 ft/sec is needed for community access),  TUG of 7 sec (>13.5 sec indicates increased risk for falls), and 5xSTS of 9.75 sec (>15 sec indicates increased risk for falls and decreased BLE power).  However, his FGA score is 19/30 indicating a definite fall risk.   ABC scale score of 66.9% indicates a moderate level of physical functioning, but I think he overestimates his abilities and/or has decreased insight into his actual deficits.  HE is advised to use at least a  cane for safety.    Bobby [name] will benefit from skilled PT to address above deficits to improve mobility and activity tolerance to help reach the maximal level of functional independence and mobility. Patient demonstrates  understanding of this POC and is in agreement with this plan.   OBJECTIVE IMPAIRMENTS: Abnormal gait, difficulty walking, decreased safety awareness, and impaired perceived functional ability.   ACTIVITY LIMITATIONS: carrying, bending, stairs, and locomotion level  PARTICIPATION LIMITATIONS: laundry, shopping, and community activity  PERSONAL FACTORS: Age, Time since onset of injury/illness/exacerbation, and 1-2 comorbidities:   L4/5 TLIF [facility] 6/24; CLL, HTN, memory deficit are also affecting patient's functional outcome.   REHAB POTENTIAL:  Good  CLINICAL DECISION MAKING: Evolving/moderate complexity  EVALUATION COMPLEXITY: Moderate   GOALS: Goals reviewed with patient? Yes  SHORT TERM GOALS: Target date: 09/06/24   Patient will demonstrate sufficient BLE strength to be able to go from standing to kneeling lunge position and return to stand independently to be able to transfer independently floor to stand Baseline: PT assist required;  has to hold onto a treatment table Goal status: IN PROGRESS- 09/04/24 2.  Patient will be able to ambulate independently on unlevel outdoor surfaces   Baseline:  TBD  Goal status:  IN PROGRESS  LONG TERM GOALS: Target date: 09/20/2024   Patient will demonstrate improved BLE strength to >/= 5/5 for improved stability and ease of mobility . Baseline: Refer to above LE MMT table Goal status: IN PROGRESS- 08/19/24 see table    2.     Cognitive TUG will improve to </= 10 sec to decrease fall risk   Baseline:  counting backward = 12.3 sec     Carrying full glass water = 11.3 sec   Goal status:  INITIAL   3.  Patient will be able to carry a box weighing 4-5 lbs up/down 1 flight of steps safely for home stair/IADL ability   Baseline:  TBD   Goal Status:  INITIAL on 08/19/24   PLAN:  PT FREQUENCY: 1-2x/week  PT DURATION: 8 weeks  PLANNED INTERVENTIONS: 97164- PT Re-evaluation, 97750- Physical Performance Testing, 97110-Therapeutic exercises, 97530- Therapeutic activity, [id number]- Neuromuscular re-education, 97535- Self Care, [id number]- Manual therapy, [id number]- Gait training, [phone]- Electrical stimulation (unattended), 97016- Vasopneumatic device, [id number]- Ultrasound, [id number]- Traction (mechanical), 20560 (1-2 muscles), 20561 (3+ muscles)- Dry Needling, Patient/Family education, Balance training, Stair training, Taping, Joint mobilization, Cryotherapy, and Moist heat  PLAN FOR NEXT SESSION: work on balance on level ground carrying things, lunges/floor to stand transfers  [employer],  PTA 09/04/2024, 10:08 AM

## 2024-09-09 ENCOUNTER — Ambulatory Visit: Admitting: Rehabilitation

## 2024-09-09 ENCOUNTER — Encounter: Payer: Self-pay | Admitting: Rehabilitation

## 2024-09-09 DIAGNOSIS — M48062 Spinal stenosis, lumbar region with neurogenic claudication: Secondary | ICD-10-CM | POA: Diagnosis not present

## 2024-09-09 DIAGNOSIS — M6281 Muscle weakness (generalized): Secondary | ICD-10-CM | POA: Diagnosis not present

## 2024-09-09 DIAGNOSIS — R2689 Other abnormalities of gait and mobility: Secondary | ICD-10-CM | POA: Diagnosis not present

## 2024-09-09 DIAGNOSIS — M5459 Other low back pain: Secondary | ICD-10-CM | POA: Diagnosis not present

## 2024-09-09 NOTE — Therapy (Signed)
 OUTPATIENT PHYSICAL THERAPY NEURO TREATMENT   Patient Name: Bobby [name] MRN: [id number] DOB:1951-09-05, 73 y.o., male Today's Date: 09/09/2024   END OF SESSION:  PT End of Session - 09/09/24 1017     Visit Number 14    Authorization Type aetna medicare    PT Start Time 1015    PT Stop Time 1100    PT Time Calculation (min) 45 min    Activity Tolerance Patient tolerated treatment well;No increased pain    Behavior During Therapy Bobby [name] for tasks assessed/performed                Past Medical History:  Diagnosis Date   Anxiety and depression    Hypertension    Hyponatremia 03/2013   ongoing, seeing Dr. Tobie   Insomnia    Melanoma in situ of back [facility])    biopsy on 03/05/2020   SIADH (syndrome of inappropriate ADH production) 2017   Dr. Tobie   Squamous cell carcinoma in situ    Past Surgical History:  Procedure Laterality Date   BRONCHIAL BIOPSY  11/27/2022   Procedure: BRONCHIAL BIOPSIES;  Surgeon: Bobby Lamar RAMAN, MD;  Location: [facility] ENDOSCOPY;  Service: Cardiopulmonary;;   BRONCHIAL BRUSHINGS  11/27/2022   Procedure: BRONCHIAL BRUSHINGS;  Surgeon: Bobby Lamar RAMAN, MD;  Location: [facility] ENDOSCOPY;  Service: Cardiopulmonary;;   BRONCHIAL WASHINGS  11/27/2022   Procedure: BRONCHIAL WASHINGS;  Surgeon: Bobby Lamar RAMAN, MD;  Location: [name];  Service: Cardiopulmonary;;   MOHS SURGERY  ~ 04/2020   face, [facility]   SHOULDER SURGERY  1980s   right shoulder   TRANSFORAMINAL LUMBAR INTERBODY FUSION (TLIF) WITH PEDICLE SCREW FIXATION 1 LEVEL Left 05/17/2023   Procedure: LEFT-SIDED LUMBAR 4- LUMBAR 5 TRANSFORAMINAL LUMBAR INTERBODY FUSION AND DECOMPRESSION WITH INSTRUMENTATION AND ALLOGRAFT;  Surgeon: Bobby Anes, MD;  Location: [facility] OR;  Service: Orthopedics;  Laterality: Left;   VIDEO BRONCHOSCOPY N/A 11/27/2022   Procedure: VIDEO BRONCHOSCOPY WITH FLUORO;  Surgeon: Bobby Lamar RAMAN, MD;  Location: [facility] ENDOSCOPY;  Service: Cardiopulmonary;  Laterality: N/A;   Patient Active  Problem List   Diagnosis Date Noted   Radiculopathy of lumbar region 05/17/2023   CLL (chronic lymphocytic leukemia) (HCC) 02/21/2023   Abnormal CT of the chest 11/01/2022   Melanoma in situ of back (HCC) 03/16/2020   Squamous cell carcinoma in situ 08/21/2019   Vitamin D  deficiency 05/29/2019   PCP NOTES >>>> 09/28/2015   Hypogonadism in male 05/13/2015   SIADH (syndrome of inappropriate ADH production) (HCC) 04/13/2013   MCI (mild cognitive impairment) with memory loss 04/13/2013   Alcohol abuse 04/13/2013   Annual physical exam 03/06/2012   Idiopathic scoliosis and kyphoscoliosis 08/18/2010   Anxiety state 04/01/2009   Essential hypertension, benign 01/04/2009   Insomnia 09/08/2008    PCP: Bobby Aloysius BRAVO, MD   REFERRING PROVIDER: Tobie Tonita POUR, DO   REFERRING DIAG: R26.81 (ICD-10-CM) - Unsteady gait R41.89 (ICD-10-CM) - Cognitive changes  THERAPY DIAG:  Other abnormalities of gait and mobility  Muscle weakness (generalized)  Other low back pain  Spinal stenosis of lumbar region with neurogenic claudication  RATIONALE FOR EVALUATION AND TREATMENT: Rehabilitation  ONSET DATE: prrogressive over last 6 months  NEXT MD VISIT: after brain MRI   SUBJECTIVE:  SUBJECTIVE STATEMENT: Patient reports feels pretty good today.   States was able to carry equipment up/down steps recently without falling.  States he needs to get an old office chair down from upstairs and the new one up the steps, though, but hasn't tried it yet  EVAL: 73 y/o male referred to PT from Dr Bobby for unsteady gait and falls.  States he has gotten progressively more unsteady over last 6 months for no apparent reason.  He had therapy here a year ago following a lumbar fusion and did quite well.  He states he  really has very little back pain or trouble with his back now.   Balance is his chief c/o.    Neurology notes indicate that they feel his unsteadiness may be residual effects of the back surgery and residual canal stenosis.  They did not feel he has Parkinson's.   However, he is going to be worked up more for his memory deficits and brain MRI is pending.  Patient reports Having difficulty walking up/down steps carrying things up to the second floor of his home and is generally unsteady going out places.  He has difficulty verbalizing his exact symptoms due to what seems to be cognitive issues  Pt accompanied by: self  PAIN: Are you having pain? Yes: NPRS scale: 0/10 now;  intermittent R lateral thigh pain Pain location: R lateral thigh Pain description: aching Aggravating factors: walking >10 min Relieving factors: standing, not walking  PERTINENT HISTORY:  L4/5 TLIF [place] 6/24; CLL, HTN, memory deficit  PRECAUTIONS: Fall  RED FLAGS: None  WEIGHT BEARING RESTRICTIONS: No  FALLS:  Has patient fallen in last 6 months? Yes. Number of falls 0  LIVING ENVIRONMENT: Lives with: lives with their family and lives with their spouse Lives in: House/apartment Stairs: Yes: Internal: 4 steps; can reach both and External: 1 flight steps; on right going up Has following equipment at home: walking stick when going fishing  OCCUPATION: still rents office space out;  owns a building  PLOF: Independent with gait  PATIENT GOALS: improve my balance    OBJECTIVE: (objective measures completed at initial evaluation unless otherwise dated)  DIAGNOSTIC FINDINGS:  Out-side paper records, electronic medical record, and images have been reviewed where available and summarized as:  MRI lumbar spine 02/10/2023: 1. At L4-5 there is a broad-based disc bulge. Severe bilateral facet arthropathy and ligamentum flavum infolding. Severe spinal stenosis. No left foraminal stenosis. Severe right foraminal  stenosis. 2. At L5-S1 there is a broad-based disc osteophyte complex. Moderate bilateral facet arthropathy with a small right facet effusion. Severe left foraminal stenosis. Mild right foraminal stenosis. 3. At L3-4 there is a broad-based disc bulge. Moderate bilateral facet arthropathy. Mild spinal stenosis. Moderate right and mild left foraminal stenosis. 4. No acute osseous injury of the lumbar spine.   MRI cervical spine 02/10/2023: 1. At T1-2 there is a minimal broad-based disc osteophyte complex. Moderate-severe bilateral foraminal stenosis. 2. No acute osseous injury of the thoracic spine. 3. Partially visualized cervical spine spondylosis. If there is further clinical concern, recommend a dedicated MRI of the cervical spine.  COGNITION: Overall cognitive status: Impaired   SENSATION: WFL  COORDINATION: Finger to nose is slightly impaired  EDEMA:  None noted  MUSCLE TONE: WNL  DTRs:  NT   POSTURE:  decreased lumbar lordosis, tends to slouch  MUSCLE LENGTH: Hamstrings: moderate tightness BLE ITB: slight tight R Piriformis: slight tight BLE Hip flexors: NT  LOWER EXTREMITY ROM:  Active  Right eval Left eval  Hip flexion    Hip extension    Hip abduction    Hip adduction    Hip internal rotation    Hip external rotation    Knee flexion    Knee extension    Ankle dorsiflexion    Ankle plantarflexion    Ankle inversion    Ankle eversion     (Blank rows = not tested)  LOWER EXTREMITY MMT:    MMT Right eval Left eval R 08/06/24 L  08/06/24 RLE 08/19/24 LLE 08/19/24 R/L 09/04/24 RLE/LLE 09/09/24  Hip flexion     5 5  5/5  Hip extension     4- 4- 4+/4+   Hip abduction     4- 4- 4/4 4/4  Hip adduction          Hip internal rotation 4- 4 4 4  4+ 4+  5/5  Hip external rotation 4+ 4 4 4+ 4+ 4+  5/5  Knee flexion 5 5   5 5   5/5  Knee extension 5 5   5 5   5/5  Ankle dorsiflexion 4+ 4   5 4+  5/4+  Ankle plantarflexion     4 5  5/5  Ankle inversion           Ankle eversion          (Blank rows = not tested)  BED MOBILITY:  Independent in all aspects  TRANSFERS: Assistive device utilized: None  Sit to stand: SBA Stand to sit: Complete Independence Chair to chair: SBA Floor: NT  GAIT: Distance walked: 150' in clinic Assistive device utilized: None Level of assistance: CGA Gait pattern: staggering gait, frequent losses of balance but can correct or catch himself Comments:   STAIRS:  Level of Assistance: SBA  Stair Negotiation Technique: Alternating Pattern  with Single Rail on Right  Number of Stairs: 12    Height of Stairs: 7  Comments: goes quickly to compensate for lack of balance  FUNCTIONAL TESTS:  FGA = 17 5X STS = 9.75 sec TUG = 7 sec Gait speed = 3.11 ft/sec  08/19/24: TUG counting backward = 12.3 sec;  has 2 losses of balance TUG Carrying full glass water = 11.3 sec;  unable to keep water from spilling  PATIENT SURVEYS:  ABC = 66.9%   TODAY'S TREATMENT:  09/09/24 THERAPEUTIC EXERCISE: To improve endurance.  Demonstration, verbal and tactile cues throughout for technique. Bike L4 x 8'  THERAPEUTIC ACTIVITIES: To improve functional performance.  Demonstration, verbal and tactile cues throughout for technique. Carrying 7.5 lb box up/down 1 flight of steps x 2 trials Step up and overs on 6 step w/ 7.5 lb box x 5 each way Step over foam roll (small) w/ 7.5 lb box x 2/5 S/S X 2/5 F/B Step over foam roll (small) EO head up x 10;  EC x 10 F/B Rechecked cognitive TUG and carrying water TUG Rechecked strength BLE   PATIENT EDUCATION:  Education details: safety carrying things up/down steps and getting help to carry heavier objects rather than   Person educated: Patient and Spouse Education method: Explanation, Demonstration, Verbal cues, Tactile cues, and MedBridgeGO app access provided Education comprehension: verbalized understanding, verbal cues required, tactile cues required, and needs further  education  HOME EXERCISE PROGRAM: Access Code: [email or web address] URL: [email or web address] Date: 09/04/2024 Prepared by: Bobby [name]  Exercises - Toe Raises with Unilateral Counter Support  - 1 x daily - 7 x weekly - 3 sets -  10 reps - Heel Raises with Counter Support  - 1 x daily - 7 x weekly - 3 sets - 10 reps - Mini Squat with Counter Support  - 1 x daily - 7 x weekly - 3 sets - 10 reps - Standing Tandem Balance with Counter Support  - 1 x daily - 7 x weekly - 1 sets - 1 reps - 1 min hold - Single Leg Stance  - 1 x daily - 7 x weekly - 1 sets - 1 reps - 1 min hold - [place]ari[place] with Counter Support  - 1 x daily - 7 x weekly - 3 sets - 10 reps - Tandem Walking with Counter Support  - 1 x daily - 7 x weekly - 3 sets - 10 reps - Backward Tandem Walking with Counter Support  - 1 x daily - 7 x weekly - 3 sets - 10 reps - Sidelying Reverse Clamshell  - 1 x daily - 7 x weekly - 2 sets - 10 reps - Clamshell with Resistance  - 1 x daily - 7 x weekly - 2 sets - 10 reps - Seated Hamstring Stretch  - 1 x daily - 7 x weekly - 3 sets - 3 reps - 30 sec hold  Patient Education - What You Can Do to Prevent Falls   ASSESSMENT:  CLINICAL IMPRESSION:  Rechecked patient's strength and TUG testing and he has met goals for these tests.  He has also met goal for carrying a box up/down a flight of steps.  However, it is not advisable for him to be carrying anything heavier than a box or unwieldy like an office chair up/down steps.   He is advised to have someone else do these tasks for him, and he verbalizes understanding and agreement.   He has made good overall progress and will be ready for D/C next visit.   He is in agreement with this plan  Bobby [name] is a 73 y.o. male who was referred to physical therapy for evaluation and treatment for unsteady gait and falls.  Patient presents with physical impairments of impaired activity tolerance, impaired standing balance, impaired ambulation, and  decreased safety awareness impacting safe and independent functional mobility.  He covers for his poor overall balance by ambulating and transferring very quickly.   However, when he is asked to walk and do activities more slowly with better control, he looses balance much more easily.   Timed tests do not show balance deficits as follows:  Gait speed 3.11 ft/sec, (2.62 ft/sec is needed for community access),  TUG of 7 sec (>13.5 sec indicates increased risk for falls), and 5xSTS of 9.75 sec (>15 sec indicates increased risk for falls and decreased BLE power).  However, his FGA score is 19/30 indicating a definite fall risk.   ABC scale score of 66.9% indicates a moderate level of physical functioning, but I think he overestimates his abilities and/or has decreased insight into his actual deficits.  HE is advised to use at least a  cane for safety.    Bobby [name] will benefit from skilled PT to address above deficits to improve mobility and activity tolerance to help reach the maximal level of functional independence and mobility. Patient demonstrates understanding of this POC and is in agreement with this plan.   OBJECTIVE IMPAIRMENTS: Abnormal gait, difficulty walking, decreased safety awareness, and impaired perceived functional ability.   ACTIVITY LIMITATIONS: carrying, bending, stairs, and locomotion level  PARTICIPATION LIMITATIONS: laundry, shopping,  and community activity  PERSONAL FACTORS: Age, Time since onset of injury/illness/exacerbation, and 1-2 comorbidities:   L4/5 TLIF Bobby [name] 6/24; CLL, HTN, memory deficit are also affecting patient's functional outcome.   REHAB POTENTIAL: Good  CLINICAL DECISION MAKING: Evolving/moderate complexity  EVALUATION COMPLEXITY: Moderate   GOALS: Goals reviewed with patient? Yes  SHORT TERM GOALS: Target date: 09/06/24   Patient will demonstrate sufficient BLE strength to be able to go from standing to kneeling lunge position and return to  stand independently to be able to transfer independently floor to stand Baseline: PT assist required;  has to hold onto a treatment table Goal status: IN PROGRESS- 09/04/24 2.  Patient will be able to ambulate independently on unlevel outdoor surfaces   Baseline:  TBD  Goal status:  IN PROGRESS  LONG TERM GOALS: Target date: 09/20/2024   Patient will demonstrate improved BLE strength to >/= 5/5 for improved stability and ease of mobility . Baseline: Refer to above LE MMT table Goal status: MET 09/09/24 except for hip abduction    2.     Cognitive TUG will improve to </= 10 sec to decrease fall risk   Baseline:  counting backward = 12.3 sec     Carrying full glass water = 11.3 sec 09/09/24:  counting backward from 20 in 7.73 sec          Carrying full cup of water = 9.06 sec   Goal status:  MET   3.  Patient will be able to carry a box weighing 4-5 lbs up/down 1 flight of steps safely for home stair/IADL ability   Baseline:  TBD   Goal Status: MET --09/09/24   PLAN:  PT FREQUENCY: 1-2x/week  PT DURATION: 8 weeks  PLANNED INTERVENTIONS: 97164- PT Re-evaluation, 97750- Physical Performance Testing, 97110-Therapeutic exercises, 97530- Therapeutic activity, [id number]- Neuromuscular re-education, 97535- Self Care, [id number]- Manual therapy, [id number]- Gait training, [phone]- Electrical stimulation (unattended), 97016- Vasopneumatic device, [id number]- Ultrasound, [id number]- Traction (mechanical), 20560 (1-2 muscles), 20561 (3+ muscles)- Dry Needling, Patient/Family education, Balance training, Stair training, Taping, Joint mobilization, Cryotherapy, and Moist heat  PLAN FOR NEXT SESSION:  D/C PT  Venkat Ankney, PT 09/09/2024, 1:30 PM

## 2024-09-16 ENCOUNTER — Encounter: Payer: Self-pay | Admitting: Rehabilitation

## 2024-09-16 ENCOUNTER — Ambulatory Visit: Admitting: Rehabilitation

## 2024-09-16 DIAGNOSIS — R2689 Other abnormalities of gait and mobility: Secondary | ICD-10-CM

## 2024-09-16 DIAGNOSIS — M6281 Muscle weakness (generalized): Secondary | ICD-10-CM | POA: Diagnosis not present

## 2024-09-16 DIAGNOSIS — M5459 Other low back pain: Secondary | ICD-10-CM | POA: Diagnosis not present

## 2024-09-16 DIAGNOSIS — M48062 Spinal stenosis, lumbar region with neurogenic claudication: Secondary | ICD-10-CM

## 2024-09-16 NOTE — Therapy (Signed)
 OUTPATIENT PHYSICAL THERAPY NEURO TREATMENT / DC SUMMARY   Patient Name: Bobby [name] MRN: [id number] DOB:12-09-1950, 73 y.o., male Today's Date: 09/16/2024   END OF SESSION:  PT End of Session - 09/16/24 1615     Visit Number 15    Authorization Type aetna medicare    PT Start Time 1610    PT Stop Time 1700    PT Time Calculation (min) 50 min    Activity Tolerance Patient tolerated treatment well;No increased pain    Behavior During Therapy [facility] for tasks assessed/performed                Past Medical History:  Diagnosis Date   Anxiety and depression    Hypertension    Hyponatremia 03/2013   ongoing, seeing Dr. Tobie   Insomnia    Melanoma in situ of back [facility])    biopsy on 03/05/2020   SIADH (syndrome of inappropriate ADH production) 2017   Dr. Tobie   Squamous cell carcinoma in situ    Past Surgical History:  Procedure Laterality Date   BRONCHIAL BIOPSY  11/27/2022   Procedure: BRONCHIAL BIOPSIES;  Surgeon: Shelah Lamar RAMAN, MD;  Location: [facility] ENDOSCOPY;  Service: Cardiopulmonary;;   BRONCHIAL BRUSHINGS  11/27/2022   Procedure: BRONCHIAL BRUSHINGS;  Surgeon: Shelah Lamar RAMAN, MD;  Location: [facility] ENDOSCOPY;  Service: Cardiopulmonary;;   BRONCHIAL WASHINGS  11/27/2022   Procedure: BRONCHIAL WASHINGS;  Surgeon: Shelah Lamar RAMAN, MD;  Location: [name];  Service: Cardiopulmonary;;   MOHS SURGERY  ~ 04/2020   face, [facility]   SHOULDER SURGERY  1980s   right shoulder   TRANSFORAMINAL LUMBAR INTERBODY FUSION (TLIF) WITH PEDICLE SCREW FIXATION 1 LEVEL Left 05/17/2023   Procedure: LEFT-SIDED LUMBAR 4- LUMBAR 5 TRANSFORAMINAL LUMBAR INTERBODY FUSION AND DECOMPRESSION WITH INSTRUMENTATION AND ALLOGRAFT;  Surgeon: Beuford Anes, MD;  Location: [facility] OR;  Service: Orthopedics;  Laterality: Left;   VIDEO BRONCHOSCOPY N/A 11/27/2022   Procedure: VIDEO BRONCHOSCOPY WITH FLUORO;  Surgeon: Shelah Lamar RAMAN, MD;  Location: [facility] ENDOSCOPY;  Service: Cardiopulmonary;  Laterality: N/A;    Patient Active Problem List   Diagnosis Date Noted   Radiculopathy of lumbar region 05/17/2023   CLL (chronic lymphocytic leukemia) (HCC) 02/21/2023   Abnormal CT of the chest 11/01/2022   Melanoma in situ of back (HCC) 03/16/2020   Squamous cell carcinoma in situ 08/21/2019   Vitamin D  deficiency 05/29/2019   PCP NOTES >>>> 09/28/2015   Hypogonadism in male 05/13/2015   SIADH (syndrome of inappropriate ADH production) (HCC) 04/13/2013   MCI (mild cognitive impairment) with memory loss 04/13/2013   Alcohol abuse 04/13/2013   Annual physical exam 03/06/2012   Idiopathic scoliosis and kyphoscoliosis 08/18/2010   Anxiety state 04/01/2009   Essential hypertension, benign 01/04/2009   Insomnia 09/08/2008    PCP: Amon Aloysius BRAVO, MD   REFERRING PROVIDER: Tobie Tonita POUR, DO   REFERRING DIAG: R26.81 (ICD-10-CM) - Unsteady gait R41.89 (ICD-10-CM) - Cognitive changes  THERAPY DIAG:  Other abnormalities of gait and mobility  Muscle weakness (generalized)  Other low back pain  Spinal stenosis of lumbar region with neurogenic claudication  RATIONALE FOR EVALUATION AND TREATMENT: Rehabilitation  ONSET DATE: prrogressive over last 6 months  NEXT MD VISIT: after brain MRI   SUBJECTIVE:  SUBJECTIVE STATEMENT: Patient reports feels pretty good today.   States was able to carry equipment up/down steps recently without falling.  States he needs to get an old office chair down from upstairs and the new one up the steps, though, but hasn't tried it yet  EVAL: 73 y/o male referred to PT from Dr Tobie for unsteady gait and falls.  States he has gotten progressively more unsteady over last 6 months for no apparent reason.  He had therapy here a year ago following a lumbar fusion and did quite well.   He states he really has very little back pain or trouble with his back now.   Balance is his chief c/o.    Neurology notes indicate that they feel his unsteadiness may be residual effects of the back surgery and residual canal stenosis.  They did not feel he has Parkinson's.   However, he is going to be worked up more for his memory deficits and brain MRI is pending.  Patient reports Having difficulty walking up/down steps carrying things up to the second floor of his home and is generally unsteady going out places.  He has difficulty verbalizing his exact symptoms due to what seems to be cognitive issues  Pt accompanied by: self  PAIN: Are you having pain? Yes: NPRS scale: 0/10 now;  intermittent R lateral thigh pain Pain location: R lateral thigh Pain description: aching Aggravating factors: walking >10 min Relieving factors: standing, not walking  PERTINENT HISTORY:  L4/5 TLIF [place] 6/24; CLL, HTN, memory deficit  PRECAUTIONS: Fall  RED FLAGS: None  WEIGHT BEARING RESTRICTIONS: No  FALLS:  Has patient fallen in last 6 months? Yes. Number of falls 0  LIVING ENVIRONMENT: Lives with: lives with their family and lives with their spouse Lives in: House/apartment Stairs: Yes: Internal: 4 steps; can reach both and External: 1 flight steps; on right going up Has following equipment at home: walking stick when going fishing  OCCUPATION: still rents office space out;  owns a building  PLOF: Independent with gait  PATIENT GOALS: improve my balance    OBJECTIVE: (objective measures completed at initial evaluation unless otherwise dated)  DIAGNOSTIC FINDINGS:  Out-side paper records, electronic medical record, and images have been reviewed where available and summarized as:  MRI lumbar spine 02/10/2023: 1. At L4-5 there is a broad-based disc bulge. Severe bilateral facet arthropathy and ligamentum flavum infolding. Severe spinal stenosis. No left foraminal stenosis. Severe  right foraminal stenosis. 2. At L5-S1 there is a broad-based disc osteophyte complex. Moderate bilateral facet arthropathy with a small right facet effusion. Severe left foraminal stenosis. Mild right foraminal stenosis. 3. At L3-4 there is a broad-based disc bulge. Moderate bilateral facet arthropathy. Mild spinal stenosis. Moderate right and mild left foraminal stenosis. 4. No acute osseous injury of the lumbar spine.   MRI cervical spine 02/10/2023: 1. At T1-2 there is a minimal broad-based disc osteophyte complex. Moderate-severe bilateral foraminal stenosis. 2. No acute osseous injury of the thoracic spine. 3. Partially visualized cervical spine spondylosis. If there is further clinical concern, recommend a dedicated MRI of the cervical spine.  COGNITION: Overall cognitive status: Impaired   SENSATION: WFL  COORDINATION: Finger to nose is slightly impaired  EDEMA:  None noted  MUSCLE TONE: WNL  DTRs:  NT   POSTURE:  decreased lumbar lordosis, tends to slouch  MUSCLE LENGTH: Hamstrings: moderate tightness BLE ITB: slight tight R Piriformis: slight tight BLE Hip flexors: NT  LOWER EXTREMITY ROM:  Active  Right eval Left eval  Hip flexion    Hip extension    Hip abduction    Hip adduction    Hip internal rotation    Hip external rotation    Knee flexion    Knee extension    Ankle dorsiflexion    Ankle plantarflexion    Ankle inversion    Ankle eversion     (Blank rows = not tested)  LOWER EXTREMITY MMT:    MMT Right eval Left eval R 08/06/24 L  08/06/24 RLE 08/19/24 LLE 08/19/24 R/L 09/04/24 RLE/LLE 09/09/24  Hip flexion     5 5  5/5  Hip extension     4- 4- 4+/4+   Hip abduction     4- 4- 4/4 4/4  Hip adduction          Hip internal rotation 4- 4 4 4  4+ 4+  5/5  Hip external rotation 4+ 4 4 4+ 4+ 4+  5/5  Knee flexion 5 5   5 5   5/5  Knee extension 5 5   5 5   5/5  Ankle dorsiflexion 4+ 4   5 4+  5/4+  Ankle plantarflexion     4 5  5/5   Ankle inversion          Ankle eversion          (Blank rows = not tested)  BED MOBILITY:  Independent in all aspects  TRANSFERS: Assistive device utilized: None  Sit to stand: SBA Stand to sit: Complete Independence Chair to chair: SBA Floor: NT  GAIT: Distance walked: 150' in clinic Assistive device utilized: None Level of assistance: CGA Gait pattern: staggering gait, frequent losses of balance but can correct or catch himself Comments:   STAIRS:  Level of Assistance: SBA  Stair Negotiation Technique: Alternating Pattern  with Single Rail on Right  Number of Stairs: 12    Height of Stairs: 7  Comments: goes quickly to compensate for lack of balance  FUNCTIONAL TESTS:  FGA = 17 5X STS = 9.75 sec TUG = 7 sec Gait speed = 3.11 ft/sec  08/19/24: TUG counting backward = 12.3 sec;  has 2 losses of balance TUG Carrying full glass water = 11.3 sec;  unable to keep water from spilling  PATIENT SURVEYS:  ABC = 66.9%   TODAY'S TREATMENT:  09/16/24 THERAPEUTIC EXERCISE: To improve endurance.  Demonstration, verbal and tactile cues throughout for technique. Bike L5 x 8'  Lunge to floor kneel and return to stand x 2/10 BLE Floor to Stand transfer independently SLS x 20 sec x 3 BLE Tandem stance x 1' BLE Tandem gait F/B at counter x 4 laps Braiding in front x 4 laps at counter Braiding behind x 4 laps at counter 5X sit to stand no hands independently Corner:   stagger stance ball toss x 1' x 2 Foam stand EC x 1' Foam stand heel to toe rocking x 20 BLE Foam stand marching x 1' Seated knee extension 45# x 20 Seated knee flexion 25# x 20 BLE  09/09/24 THERAPEUTIC EXERCISE: To improve endurance.  Demonstration, verbal and tactile cues throughout for technique. Bike L4 x 8'  THERAPEUTIC ACTIVITIES: To improve functional performance.  Demonstration, verbal and tactile cues throughout for technique. Carrying 7.5 lb box up/down 1 flight of steps x 2 trials Step up and  overs on 6 step w/ 7.5 lb box x 5 each way Step over foam roll (small) w/ 7.5 lb box x  2/5 S/S X 2/5 F/B Step over foam roll (small) EO head up x 10;  EC x 10 F/B Rechecked cognitive TUG and carrying water TUG Rechecked strength BLE   PATIENT EDUCATION:  Education details: safety carrying things up/down steps and getting help to carry heavier objects rather than   Person educated: Patient and Spouse Education method: Explanation, Demonstration, Verbal cues, Tactile cues, and MedBridgeGO app access provided Education comprehension: verbalized understanding, verbal cues required, tactile cues required, and needs further education  HOME EXERCISE PROGRAM: Access Code: [email or web address] URL: [email or web address] Date: 09/04/2024 Prepared by: Braylin Clark  Exercises - Toe Raises with Unilateral Counter Support  - 1 x daily - 7 x weekly - 3 sets - 10 reps - Heel Raises with Counter Support  - 1 x daily - 7 x weekly - 3 sets - 10 reps - Mini Squat with Counter Support  - 1 x daily - 7 x weekly - 3 sets - 10 reps - Standing Tandem Balance with Counter Support  - 1 x daily - 7 x weekly - 1 sets - 1 reps - 1 min hold - Single Leg Stance  - 1 x daily - 7 x weekly - 1 sets - 1 reps - 1 min hold - Carioca with Counter Support  - 1 x daily - 7 x weekly - 3 sets - 10 reps - Tandem Walking with Counter Support  - 1 x daily - 7 x weekly - 3 sets - 10 reps - Backward Tandem Walking with Counter Support  - 1 x daily - 7 x weekly - 3 sets - 10 reps - Sidelying Reverse Clamshell  - 1 x daily - 7 x weekly - 2 sets - 10 reps - Clamshell with Resistance  - 1 x daily - 7 x weekly - 2 sets - 10 reps - Seated Hamstring Stretch  - 1 x daily - 7 x weekly - 3 sets - 3 reps - 30 sec hold  Patient Education - What You Can Do to Prevent Falls   ASSESSMENT:  CLINICAL IMPRESSION: Patient has been seen x 3 months PT for unsteady gait and falls.  He has made excellent progress and is ready for D/C.   HE is  able to carry a box up/down a flight of steps now.   He is advised that we would prefer that he didn't carry things up/down steps, though.  He has good strength in his legs except for hip abductors which still have some residual weakness.  All of his balance testing with BERG, FGA, etc are improved to a level that is appropriate for his age group.   He has a good home exercise regimen that he should continue with daily as tolerated and call us  with any further questions.   He verbalizes understanding and agreement.   D/C PT  Bobby [name] is a 73 y.o. male who was referred to physical therapy for evaluation and treatment for unsteady gait and falls.  Patient presents with physical impairments of impaired activity tolerance, impaired standing balance, impaired ambulation, and decreased safety awareness impacting safe and independent functional mobility.  He covers for his poor overall balance by ambulating and transferring very quickly.   However, when he is asked to walk and do activities more slowly with better control, he looses balance much more easily.   Timed tests do not show balance deficits as follows:  Gait speed 3.11 ft/sec, (2.62 ft/sec is needed for community access),  TUG of  7 sec (>13.5 sec indicates increased risk for falls), and 5xSTS of 9.75 sec (>15 sec indicates increased risk for falls and decreased BLE power).  However, his FGA score is 19/30 indicating a definite fall risk.   ABC scale score of 66.9% indicates a moderate level of physical functioning, but I think he overestimates his abilities and/or has decreased insight into his actual deficits.  HE is advised to use at least a  cane for safety.    Bobby [name] will benefit from skilled PT to address above deficits to improve mobility and activity tolerance to help reach the maximal level of functional independence and mobility. Patient demonstrates understanding of this POC and is in agreement with this plan.   OBJECTIVE  IMPAIRMENTS: Abnormal gait, difficulty walking, decreased safety awareness, and impaired perceived functional ability.   ACTIVITY LIMITATIONS: carrying, bending, stairs, and locomotion level  PARTICIPATION LIMITATIONS: laundry, shopping, and community activity  PERSONAL FACTORS: Age, Time since onset of injury/illness/exacerbation, and 1-2 comorbidities:   L4/5 TLIF [facility] 6/24; CLL, HTN, memory deficit are also affecting patient's functional outcome.   REHAB POTENTIAL: Good  CLINICAL DECISION MAKING: Evolving/moderate complexity  EVALUATION COMPLEXITY: Moderate   GOALS: Goals reviewed with patient? Yes  SHORT TERM GOALS: Target date: 09/06/24   Patient will demonstrate sufficient BLE strength to be able to go from standing to kneeling lunge position and return to stand independently to be able to transfer independently floor to stand Baseline: PT assist required;  has to hold onto a treatment table Goal status: MET- 09/04/24 2.  Patient will be able to ambulate independently on unlevel outdoor surfaces   Baseline:  TBD  Goal status:  MET per patient report--going fishing and walking up/down banks, grassy areas to get into the river  LONG TERM GOALS: Target date: 09/20/2024   Patient will demonstrate improved BLE strength to >/= 5/5 for improved stability and ease of mobility . Baseline: Refer to above LE MMT table Goal status: MET 09/09/24 except for hip abduction    2.     Cognitive TUG will improve to </= 10 sec to decrease fall risk   Baseline:  counting backward = 12.3 sec     Carrying full glass water = 11.3 sec 09/09/24:  counting backward from 20 in 7.73 sec          Carrying full cup of water = 9.06 sec   Goal status:  MET   3.  Patient will be able to carry a box weighing 4-5 lbs up/down 1 flight of steps safely for home stair/IADL ability   Baseline:  TBD   Goal Status: MET --09/09/24   PLAN:  PT FREQUENCY: 1-2x/week  PT DURATION: 8 weeks  PLANNED  INTERVENTIONS: 97164- PT Re-evaluation, 97750- Physical Performance Testing, 97110-Therapeutic exercises, 97530- Therapeutic activity, [id number]- Neuromuscular re-education, 97535- Self Care, [id number]- Manual therapy, [id number]- Gait training, [phone]- Electrical stimulation (unattended), 97016- Vasopneumatic device, [id number]- Ultrasound, [id number]- Traction (mechanical), 20560 (1-2 muscles), 20561 (3+ muscles)- Dry Needling, Patient/Family education, Balance training, Stair training, Taping, Joint mobilization, Cryotherapy, and Moist heat  PLAN FOR NEXT SESSION:  D/C PT  PHYSICAL THERAPY DISCHARGE SUMMARY  Visits from Start of Care: 15   Current functional level related to goals / functional outcomes: See assessment section above   Remaining deficits: Residual balance deficits    Education / Equipment: Patient is independent with all home exercises and advised to continue daily as tolerated and call us  with any questions   Patient agrees to discharge. Patient  goals were met. Patient is being discharged due to meeting the stated rehab goals.   Bobby [name], PT 09/16/2024, 5:12 PM

## 2024-09-17 DIAGNOSIS — D72829 Elevated white blood cell count, unspecified: Secondary | ICD-10-CM | POA: Diagnosis not present

## 2024-09-17 DIAGNOSIS — E559 Vitamin D deficiency, unspecified: Secondary | ICD-10-CM | POA: Diagnosis not present

## 2024-09-17 DIAGNOSIS — E871 Hypo-osmolality and hyponatremia: Secondary | ICD-10-CM | POA: Diagnosis not present

## 2024-09-17 DIAGNOSIS — I10 Essential (primary) hypertension: Secondary | ICD-10-CM | POA: Diagnosis not present

## 2024-09-24 ENCOUNTER — Inpatient Hospital Stay: Attending: Hematology & Oncology

## 2024-09-24 ENCOUNTER — Ambulatory Visit: Admitting: *Deleted

## 2024-09-24 ENCOUNTER — Telehealth: Payer: Self-pay

## 2024-09-24 ENCOUNTER — Inpatient Hospital Stay (HOSPITAL_BASED_OUTPATIENT_CLINIC_OR_DEPARTMENT_OTHER): Admitting: Hematology & Oncology

## 2024-09-24 VITALS — BP 132/58 | HR 61 | Temp 97.7°F | Resp 16 | Wt 173.0 lb

## 2024-09-24 VITALS — Ht 72.0 in | Wt 178.0 lb

## 2024-09-24 DIAGNOSIS — C9110 Chronic lymphocytic leukemia of B-cell type not having achieved remission: Secondary | ICD-10-CM | POA: Diagnosis not present

## 2024-09-24 DIAGNOSIS — M549 Dorsalgia, unspecified: Secondary | ICD-10-CM | POA: Insufficient documentation

## 2024-09-24 DIAGNOSIS — Z79899 Other long term (current) drug therapy: Secondary | ICD-10-CM | POA: Diagnosis not present

## 2024-09-24 DIAGNOSIS — D72820 Lymphocytosis (symptomatic): Secondary | ICD-10-CM | POA: Diagnosis not present

## 2024-09-24 DIAGNOSIS — Z0000 Encounter for general adult medical examination without abnormal findings: Secondary | ICD-10-CM

## 2024-09-24 LAB — CBC WITH DIFFERENTIAL (CANCER CENTER ONLY)
Abs Immature Granulocytes: 0.04 K/uL (ref 0.00–0.07)
Basophils Absolute: 0.1 K/uL (ref 0.0–0.1)
Basophils Relative: 0 %
Eosinophils Absolute: 0.2 K/uL (ref 0.0–0.5)
Eosinophils Relative: 1 %
HCT: 36.6 % — ABNORMAL LOW (ref 39.0–52.0)
Hemoglobin: 12.8 g/dL — ABNORMAL LOW (ref 13.0–17.0)
Immature Granulocytes: 0 %
Lymphocytes Relative: 80 %
Lymphs Abs: 18.2 K/uL — ABNORMAL HIGH (ref 0.7–4.0)
MCH: 32.3 pg (ref 26.0–34.0)
MCHC: 35 g/dL (ref 30.0–36.0)
MCV: 92.4 fL (ref 80.0–100.0)
Monocytes Absolute: 0.6 K/uL (ref 0.1–1.0)
Monocytes Relative: 3 %
Neutro Abs: 3.8 K/uL (ref 1.7–7.7)
Neutrophils Relative %: 16 %
Platelet Count: 209 K/uL (ref 150–400)
RBC: 3.96 MIL/uL — ABNORMAL LOW (ref 4.22–5.81)
RDW: 12 % (ref 11.5–15.5)
Smear Review: NORMAL
WBC Count: 22.8 K/uL — ABNORMAL HIGH (ref 4.0–10.5)
nRBC: 0 % (ref 0.0–0.2)

## 2024-09-24 LAB — CMP (CANCER CENTER ONLY)
ALT: 26 U/L (ref 0–44)
AST: 29 U/L (ref 15–41)
Albumin: 4.7 g/dL (ref 3.5–5.0)
Alkaline Phosphatase: 72 U/L (ref 38–126)
Anion gap: 10 (ref 5–15)
BUN: 17 mg/dL (ref 8–23)
CO2: 28 mmol/L (ref 22–32)
Calcium: 9.5 mg/dL (ref 8.9–10.3)
Chloride: 99 mmol/L (ref 98–111)
Creatinine: 0.89 mg/dL (ref 0.61–1.24)
GFR, Estimated: >60 mL/min (ref >60)
Glucose, Bld: 93 mg/dL (ref 70–99)
Potassium: 4.7 mmol/L (ref 3.5–5.1)
Sodium: 136 mmol/L (ref 135–145)
Total Bilirubin: 0.6 mg/dL (ref 0.0–1.2)
Total Protein: 6.9 g/dL (ref 6.5–8.1)

## 2024-09-24 LAB — SAVE SMEAR(SSMR), FOR PROVIDER SLIDE REVIEW

## 2024-09-24 LAB — LACTATE DEHYDROGENASE: LDH: 148 U/L (ref 98–192)

## 2024-09-24 NOTE — Patient Instructions (Addendum)
 Bobby [name],  Thank you for taking the time for your Medicare Wellness Visit. I appreciate your continued commitment to your health goals. Please review the care plan we discussed, and feel free to reach out if I can assist you further.  Please note that Annual Wellness Visits do not include a physical exam. Some assessments may be limited, especially if the visit was conducted virtually. If needed, we may recommend an in-person follow-up with your provider.  Goals:  To continue riding bicycle and lift weights  Ongoing Care Seeing your primary care provider every 3 to 6 months helps us  monitor your health and provide consistent, personalized care.   Dr Amon: 03/02/25 9am Annual Wellness Visit:  09/30/25 10:20am, telephone  Recommended Screenings:  Health Maintenance  Topic Date Due   Medicare Annual Wellness Visit  08/21/2024   COVID-19 Vaccine (10 - Pfizer risk 2025-26 season) 02/03/2025   Colon Cancer Screening  05/17/2026   DTaP/Tdap/Td vaccine (3 - Td or Tdap) 10/12/2029   Pneumococcal Vaccine for age over 4  Completed   Flu Shot  Completed   Hepatitis C Screening  Completed   Zoster (Shingles) Vaccine  Completed   Meningitis B Vaccine  Aged Out       06/24/2024   10:23 AM  Advanced Directives  Does Patient Have a Medical Advance Directive? Yes  Type of Advance Directive Healthcare Power of Attorney  Does patient want to make changes to medical advance directive? No - Patient declined  Copy of Healthcare Power of Attorney in Chart? No - copy requested  Bring a copy of your health care power of attorney and living will to the office to be added to your chart at your convenience. You can mail a copy to [facility] [address] [place], [place] [address] or email to [email or web address]   Vision: Annual vision screenings are recommended for early detection of glaucoma, cataracts, and diabetic retinopathy. These exams can also reveal signs of chronic  conditions such as diabetes and high blood pressure.  Dental: Annual dental screenings help detect early signs of oral cancer, gum disease, and other conditions linked to overall health, including heart disease and diabetes.  Please see the attached documents for additional preventive care recommendations.

## 2024-09-24 NOTE — Progress Notes (Signed)
 Please attest this visit in the absence of patient primary care provider.    I connected with  Bobby [name] on 09/24/24 by a audio enabled telemedicine application and verified that I am speaking with the correct person using two identifiers.  Patient Location: Home  Provider Location: Office/Clinic  I discussed the limitations of evaluation and management by telemedicine. The patient expressed understanding and agreed to proceed.  Subjective:   Bobby [name] is a 73 y.o. male who presents for a Medicare Annual Wellness Visit.  Allergies (verified) Tramadol   History: Past Medical History:  Diagnosis Date   Anxiety and depression    Hypertension    Hyponatremia 03/2013   ongoing, seeing Dr. Tobie   Insomnia    Melanoma in situ of back [facility])    biopsy on 03/05/2020   SIADH (syndrome of inappropriate ADH production) 2017   Dr. Tobie   Squamous cell carcinoma in situ    Past Surgical History:  Procedure Laterality Date   BRONCHIAL BIOPSY  11/27/2022   Procedure: BRONCHIAL BIOPSIES;  Surgeon: Shelah Lamar RAMAN, MD;  Location: [facility] ENDOSCOPY;  Service: Cardiopulmonary;;   BRONCHIAL BRUSHINGS  11/27/2022   Procedure: BRONCHIAL BRUSHINGS;  Surgeon: Shelah Lamar RAMAN, MD;  Location: [facility] ENDOSCOPY;  Service: Cardiopulmonary;;   BRONCHIAL WASHINGS  11/27/2022   Procedure: BRONCHIAL WASHINGS;  Surgeon: Shelah Lamar RAMAN, MD;  Location: [name];  Service: Cardiopulmonary;;   MOHS SURGERY  ~ 04/2020   face, [facility]   SHOULDER SURGERY  1980s   right shoulder   TRANSFORAMINAL LUMBAR INTERBODY FUSION (TLIF) WITH PEDICLE SCREW FIXATION 1 LEVEL Left 05/17/2023   Procedure: LEFT-SIDED LUMBAR 4- LUMBAR 5 TRANSFORAMINAL LUMBAR INTERBODY FUSION AND DECOMPRESSION WITH INSTRUMENTATION AND ALLOGRAFT;  Surgeon: Beuford Anes, MD;  Location: [facility] OR;  Service: Orthopedics;  Laterality: Left;   VIDEO BRONCHOSCOPY N/A 11/27/2022   Procedure: VIDEO BRONCHOSCOPY WITH FLUORO;  Surgeon: Shelah Lamar RAMAN, MD;   Location: [facility] ENDOSCOPY;  Service: Cardiopulmonary;  Laterality: N/A;   Family History  Problem Relation Age of Onset   Hypertension Mother    Stroke Mother    Diabetes Mother    Emphysema Mother        smoked   Thyroid  disease Mother    Alcoholism Father    Lung cancer Maternal Grandfather    Colon cancer Neg Hx    Prostate cancer Neg Hx    CAD Neg Hx    Social History   Occupational History   Occupation: insurance account manager   Tobacco Use   Smoking status: Never   Smokeless tobacco: Never  Vaping Use   Vaping status: Never Used  Substance and Sexual Activity   Alcohol use: Not Currently   Drug use: No   Sexual activity: Yes   Tobacco Counseling Counseling given: Not Answered  SDOH Screenings   Food Insecurity: No Food Insecurity (09/24/2024)  Housing: Low Risk  (09/24/2024)  Transportation Needs: No Transportation Needs (09/24/2024)  Utilities: Not At Risk (09/24/2024)  Alcohol Screen: Low Risk  (08/22/2023)  Depression (PHQ2-9): Low Risk  (09/24/2024)  Financial Resource Strain: Low Risk  (08/22/2023)  Physical Activity: Insufficiently Active (09/24/2024)  Social Connections: Unknown (09/24/2024)  Stress: No Stress Concern Present (09/24/2024)  Tobacco Use: Low Risk  (09/24/2024)  Health Literacy: Adequate Health Literacy (09/24/2024)   Depression Screen    09/24/2024   10:42 AM 08/27/2024    2:15 PM 07/07/2024    1:42 PM 02/27/2024    9:53 AM 08/22/2023  9:07 AM 02/21/2023   10:11 AM 08/16/2022    1:50 PM  PHQ 2/9 Scores  PHQ - 2 Score 0 0 0 0 0 0 0  PHQ- 9 Score 0 0          Goals Addressed             This Visit's Progress    Patient Stated   On track    Start bike riding & lift weights       Visit info / Clinical Intake: Medicare Wellness Visit Type:: Subsequent Annual Wellness Visit Medicare Wellness Visit Mode:: Telephone If telephone:: video declined If telephone or video:: pt reported vitals Interpreter Needed?: No Pre-visit prep was  completed: yes AWV questionnaire completed by patient prior to visit?: no Living arrangements:: lives with spouse/significant other Patient's Overall Health Status Rating: excellent Typical amount of pain: some (very little to none) Does pain affect daily life?: no Are you currently prescribed opioids?: no  Dietary Habits and Nutritional Risks How many meals a day?: (!) 1 (eats light breakfast) Eats fruit and vegetables daily?: yes Most meals are obtained by: preparing own meals Diabetic:: no  Functional Status Activities of Daily Living (to include ambulation/medication): Independent Ambulation: Independent Medication Administration: Independent Home Management: Independent Manage your own finances?: yes Primary transportation is: driving Concerns about vision?: no *vision screening is required for WTM* Concerns about hearing?: no  Fall Screening Falls in the past year?: 1 (fell while trout fishing on slippery rocks) Number of falls in past year: 0 Was there an injury with Fall?: 0 Fall Risk Category Calculator: 1 Patient Fall Risk Level: Low Fall Risk  Fall Risk Patient at Risk for Falls Due to: History of fall(s); Orthopedic patient Fall risk Follow up: Education provided  Home and Transportation Safety: All rugs have non-skid backing?: yes All stairs or steps have railings?: yes Grab bars in the bathtub or shower?: yes Have non-skid surface in bathtub or shower?: yes Good home lighting?: yes Regular seat belt use?: yes Hospital stays in the last year:: no  Cognitive Assessment Difficulty concentrating, remembering, or making decisions? : no Will 6CIT or Mini Cog be Completed: yes What year is it?: 0 points What month is it?: 0 points Give patient an address phrase to remember (5 components): [address] [place]  About what time is it?: 0 points Count backwards from 20 to 1: 0 points Say the months of the year in reverse: 0 points Repeat the address  phrase from earlier: 0 points 6 CIT Score: 0 points  Advance Directives (For Healthcare) Does Patient Have a Medical Advance Directive?: Yes Does patient want to make changes to medical advance directive?: No - Guardian declined Type of Advance Directive: Healthcare Power of Attorney Copy of Healthcare Power of Attorney in Chart?: No - copy requested  Reviewed/Updated  Reviewed/Updated: All        Objective:    Today's Vitals   09/24/24 1030  Weight: 178 lb (80.7 kg)  Height: 6' (1.829 m)   Body mass index is 24.14 kg/m.  Current Medications (verified) Outpatient Encounter Medications as of 09/24/2024  Medication Sig   atorvastatin  (LIPITOR) 20 MG tablet Take 1 tablet (20 mg total) by mouth at bedtime.   b complex vitamins capsule Take 1 capsule by mouth daily.   carvedilol  (COREG ) 6.25 MG tablet TAKE 1 TABLET BY MOUTH TWICE A DAY WITH A MEAL   cholecalciferol  (VITAMIN D3) 25 MCG (1000 UNIT) tablet Take 1,000 Units by mouth daily.  clonazePAM  (KLONOPIN ) 0.5 MG tablet TAKE 1 TABLET BY MOUTH 2 TIMES A DAY AS NEEDED FOR ANXIETY   folic acid  (FOLVITE ) 1 MG tablet Take 1 tablet (1 mg total) by mouth daily.   Melatonin 10 MG CAPS Take 10 mg by mouth at bedtime.   Multiple Vitamin (MULTIVITAMIN WITH MINERALS) TABS Take 1 tablet by mouth daily.   niacinamide 500 MG tablet Take 500 mg by mouth 2 (two) times daily with a meal.   Pseudoeph-Doxylamine-DM-APAP (NYQUIL PO) Take by mouth. 30 mls q hs.   tadalafil  (CIALIS ) 10 MG tablet TAKE 1-2 TABLETS BY MOUTH EVERY OTHER DAY AS NEEDED   zolpidem  (AMBIEN ) 5 MG tablet Take 1 tablet (5 mg total) by mouth at bedtime as needed for sleep.   No facility-administered encounter medications on file as of 09/24/2024.   Hearing/Vision screen Hearing Screening - Comments:: Denies hearing difficulties.  Vision Screening - Comments:: Up to date with routine eye exams with Ginnie Pinal Immunizations and Health Maintenance Health Maintenance  Topic  Date Due   COVID-19 Vaccine (10 - Pfizer risk 2025-26 season) 02/03/2025   Medicare Annual Wellness (AWV)  09/24/2025   Colonoscopy  05/17/2026   DTaP/Tdap/Td (3 - Td or Tdap) 10/12/2029   Pneumococcal Vaccine: 50+ Years  Completed   Influenza Vaccine  Completed   Hepatitis C Screening  Completed   Zoster Vaccines- Shingrix  Completed   Meningococcal B Vaccine  Aged Out        Assessment/Plan:  This is a routine wellness examination for Bobby.  Patient Care Team: Amon Aloysius BRAVO, MD as PCP - General Darlean Ozell NOVAK, MD as Consulting Physician (Pulmonary Disease) Tobie Gordy POUR, MD as Consulting Physician (Nephrology) Rosalie Kitchens, MD as Consulting Physician (Gastroenterology) Tobie Tonita POUR, DO as Consulting Physician (Neurology)  I have personally reviewed and noted the following in the patient's chart:   Medical and social history Use of alcohol, tobacco or illicit drugs  Current medications and supplements including opioid prescriptions. Functional ability and status Nutritional status Physical activity Advanced directives List of other physicians Hospitalizations, surgeries, and ER visits in previous 12 months Vitals Screenings to include cognitive, depression, and falls Referrals and appointments  No orders of the defined types were placed in this encounter.  In addition, I have reviewed and discussed with patient certain preventive protocols, quality metrics, and best practice recommendations. A written personalized care plan for preventive services as well as general preventive health recommendations were provided to patient.   Lolita Libra, CMA   09/24/2024   Return in 1 year (on 09/24/2025).  After Visit Summary: (MyChart) Due to this being a telephonic visit, the after visit summary with patients personalized plan was offered to patient via MyChart   Nurse Notes: nothing significant to report

## 2024-09-24 NOTE — Progress Notes (Signed)
 Hematology and Oncology Follow Up Visit  Bobby [name] [id number] [birth year] 73 y.o. 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  We last saw him back in May.  Since then, he has been doing okay.  He still has some neurological issues.  He did have back surgery.  The back surgery helped with the back pain.  Unfortunately, I think he still has some balance issues.  He has had no problems with infections.  He has had no fever.  He has had no nausea or vomiting.  He has had no cough or shortness of breath.  He has had no leg swelling.  He has had no bleeding.  Overall, I would have just said that his performance status is probably ECOG 1.   Wt Readings from Last 3 Encounters:  09/24/24 173 lb (78.5 kg)  09/24/24 178 lb (80.7 kg)  08/27/24 174 lb 8 oz (79.2 kg)     Medications:  Current Outpatient Medications:    atorvastatin  (LIPITOR) 20 MG tablet, Take 1 tablet (20 mg total) by mouth at bedtime., Disp: 90 tablet, Rfl: 1   b complex vitamins capsule, Take 1 capsule by mouth daily., Disp: , Rfl:    carvedilol  (COREG ) 6.25 MG tablet, TAKE 1 TABLET BY MOUTH TWICE A DAY WITH A MEAL, Disp: 180 tablet, Rfl: 3   cholecalciferol  (VITAMIN D3) 25 MCG (1000 UNIT) tablet, Take 1,000 Units by mouth daily., Disp: , Rfl:    clonazePAM  (KLONOPIN ) 0.5 MG tablet, TAKE 1 TABLET BY MOUTH 2 TIMES A DAY AS NEEDED FOR ANXIETY, Disp: 60 tablet, Rfl: 3   folic acid  (FOLVITE ) 1 MG tablet, Take 1 tablet (1 mg total) by mouth daily., Disp: 90 tablet, Rfl: 2   Melatonin 10 MG CAPS, Take 10 mg by mouth at bedtime., Disp: , Rfl:    Multiple Vitamin (MULTIVITAMIN WITH MINERALS) TABS, Take 1 tablet by mouth daily., Disp: , Rfl:    niacinamide 500 MG tablet, Take 500 mg by mouth 2 (two) times daily with a meal., Disp: , Rfl:    Pseudoeph-Doxylamine-DM-APAP (NYQUIL PO), Take by  mouth. 30 mls q hs., Disp: , Rfl:    tadalafil  (CIALIS ) 10 MG tablet, TAKE 1-2 TABLETS BY MOUTH EVERY OTHER DAY AS NEEDED, Disp: 20 tablet, Rfl: 3   zolpidem  (AMBIEN ) 5 MG tablet, Take 1 tablet (5 mg total) by mouth at bedtime as needed for sleep., Disp: 30 tablet, Rfl: 1  Allergies:  Allergies  Allergen Reactions   Tramadol Hives    Past Medical History, Surgical history, Social history, and Family History were reviewed and updated.  Review of Systems: Review of Systems  Constitutional: Negative.   HENT:  Negative.    Eyes: Negative.   Respiratory: Negative.    Cardiovascular: Negative.   Gastrointestinal: Negative.   Endocrine: Negative.   Genitourinary: Negative.    Musculoskeletal:  Positive for back pain.  Skin: Negative.   Neurological: Negative.   Hematological: Negative.   Psychiatric/Behavioral: Negative.      Physical Exam:  weight is 173 lb (78.5 kg). His oral temperature is 97.7 F (36.5 C). His blood pressure is 132/58 (abnormal) and his pulse is 61. His respiration is 16 and oxygen saturation is 99%.   Wt Readings from Last 3 Encounters:  09/24/24 173 lb (78.5 kg)  09/24/24 178 lb (80.7 kg)  08/27/24 174 lb 8  oz (79.2 kg)    Physical Exam Vitals reviewed.  HENT:     Head: Normocephalic and atraumatic.  Eyes:     Pupils: Pupils are equal, round, and reactive to light.  Cardiovascular:     Rate and Rhythm: Normal rate and regular rhythm.     Heart sounds: Normal heart sounds.  Pulmonary:     Effort: Pulmonary effort is normal.     Breath sounds: Normal breath sounds.  Abdominal:     General: Bowel sounds are normal.     Palpations: Abdomen is soft.  Musculoskeletal:        General: No tenderness or deformity. Normal range of motion.     Cervical back: Normal range of motion.     Comments: On the back, he has a healing lumbar laminectomy scar.  Lymphadenopathy:     Cervical: No cervical adenopathy.  Skin:    General: Skin is warm and dry.      Findings: No erythema or rash.  Neurological:     Mental Status: He is alert and oriented to person, place, and time.  Psychiatric:        Behavior: Behavior normal.        Thought Content: Thought content normal.        Judgment: Judgment normal.      Lab Results  Component Value Date   WBC 22.8 (H) 09/24/2024   HGB 12.8 (L) 09/24/2024   HCT 36.6 (L) 09/24/2024   MCV 92.4 09/24/2024   PLT 209 09/24/2024     Chemistry      Component Value Date/Time   NA 135 08/27/2024 1511   NA 133 (A) 09/19/2023 0000   K 4.4 08/27/2024 1511   CL 99 08/27/2024 1511   CO2 28 08/27/2024 1511   BUN 11 08/27/2024 1511   BUN 16 09/19/2023 0000   CREATININE 0.83 08/27/2024 1511   CREATININE 1.00 04/09/2024 1328   GLU 110 09/19/2023 0000      Component Value Date/Time   CALCIUM  8.9 08/27/2024 1511   ALKPHOS 58 04/09/2024 1328   AST 26 04/09/2024 1328   ALT 19 04/09/2024 1328   BILITOT 0.9 04/09/2024 1328       Impression and Plan: Bobby [name] is a very nice 74 year old white male.  He has a monoclonal B-cell population of lymphocytes.  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  His white cell count is up a little bit.  His hemoglobin is down a little bit.  His percentage lymphocytes are also up.  It would not surprise me if, at some point, we may have to treat him.  I think we can easily treat him with an oral agent.  I still do not think that we have to embark my therapy right now.  I would like to see him back probably in the early Spring.  I think we may have a better idea as to what is going on at that point.  Maude JONELLE Crease, MD 11/5/20252:05 PM

## 2024-09-24 NOTE — Telephone Encounter (Signed)
 Copied from CRM #[id number]. Topic: General - Other >> Sep 24, 2024 12:52 PM Thersia BROCKS wrote: Reason for CRM: Patient called in stated his weight is 168. Wanted that updated that because he believes he said 178 instead of 168 when he was talking to the wellness coach earlier

## 2024-09-24 NOTE — Progress Notes (Signed)
 Please attest this visit in the absence of patient primary care provider.    Subjective:   Bobby [name] is a 74 y.o. male who presents for a Medicare Annual Wellness Visit.  Allergies (verified) Tramadol   History: Past Medical History:  Diagnosis Date   Anxiety and depression    Hypertension    Hyponatremia 03/2013   ongoing, seeing Dr. Tobie   Insomnia    Melanoma in situ of back [facility])    biopsy on 03/05/2020   SIADH (syndrome of inappropriate ADH production) 2017   Dr. Tobie   Squamous cell carcinoma in situ    Past Surgical History:  Procedure Laterality Date   BRONCHIAL BIOPSY  11/27/2022   Procedure: BRONCHIAL BIOPSIES;  Surgeon: Shelah Lamar RAMAN, MD;  Location: [facility] ENDOSCOPY;  Service: Cardiopulmonary;;   BRONCHIAL BRUSHINGS  11/27/2022   Procedure: BRONCHIAL BRUSHINGS;  Surgeon: Shelah Lamar RAMAN, MD;  Location: [facility] ENDOSCOPY;  Service: Cardiopulmonary;;   BRONCHIAL WASHINGS  11/27/2022   Procedure: BRONCHIAL WASHINGS;  Surgeon: Shelah Lamar RAMAN, MD;  Location: [name];  Service: Cardiopulmonary;;   MOHS SURGERY  ~ 04/2020   face, [facility]   SHOULDER SURGERY  1980s   right shoulder   TRANSFORAMINAL LUMBAR INTERBODY FUSION (TLIF) WITH PEDICLE SCREW FIXATION 1 LEVEL Left 05/17/2023   Procedure: LEFT-SIDED LUMBAR 4- LUMBAR 5 TRANSFORAMINAL LUMBAR INTERBODY FUSION AND DECOMPRESSION WITH INSTRUMENTATION AND ALLOGRAFT;  Surgeon: Beuford Anes, MD;  Location: [facility] OR;  Service: Orthopedics;  Laterality: Left;   VIDEO BRONCHOSCOPY N/A 11/27/2022   Procedure: VIDEO BRONCHOSCOPY WITH FLUORO;  Surgeon: Shelah Lamar RAMAN, MD;  Location: [facility] ENDOSCOPY;  Service: Cardiopulmonary;  Laterality: N/A;   Family History  Problem Relation Age of Onset   Hypertension Mother    Stroke Mother    Diabetes Mother    Emphysema Mother        smoked   Thyroid  disease Mother    Alcoholism Father    Lung cancer Maternal Grandfather    Colon cancer Neg Hx    Prostate cancer Neg Hx    CAD Neg Hx     Social History   Occupational History   Occupation: insurance account manager   Tobacco Use   Smoking status: Never   Smokeless tobacco: Never  Vaping Use   Vaping status: Never Used  Substance and Sexual Activity   Alcohol use: Not Currently   Drug use: No   Sexual activity: Yes   Tobacco Counseling Counseling given: Not Answered  SDOH Screenings   Food Insecurity: No Food Insecurity (09/24/2024)  Housing: Low Risk  (08/22/2023)  Transportation Needs: No Transportation Needs (08/22/2023)  Utilities: Not At Risk (08/22/2023)  Alcohol Screen: Low Risk  (08/22/2023)  Depression (PHQ2-9): Low Risk  (09/24/2024)  Financial Resource Strain: Low Risk  (08/22/2023)  Physical Activity: Insufficiently Active (09/24/2024)  Social Connections: Unknown (09/24/2024)  Stress: No Stress Concern Present (08/22/2023)  Tobacco Use: Low Risk  (09/24/2024)  Health Literacy: Adequate Health Literacy (09/24/2024)   Depression Screen    09/24/2024   10:42 AM 08/27/2024    2:15 PM 07/07/2024    1:42 PM 02/27/2024    9:53 AM 08/22/2023    9:07 AM 02/21/2023   10:11 AM 08/16/2022    1:50 PM  PHQ 2/9 Scores  PHQ - 2 Score 0 0 0 0 0 0 0  PHQ- 9 Score 0 0          Goals Addressed  This Visit's Progress    Patient Stated   On track    Start bike riding & lift weights       Visit info / Clinical Intake: Medicare Wellness Visit Type:: Subsequent Annual Wellness Visit Medicare Wellness Visit Mode:: Telephone If telephone:: video declined If telephone or video:: pt reported vitals Interpreter Needed?: No Pre-visit prep was completed: yes AWV questionnaire completed by patient prior to visit?: no Living arrangements:: lives with spouse/significant other Patient's Overall Health Status Rating: excellent Typical amount of pain: some (very little to none) Does pain affect daily life?: no Are you currently prescribed opioids?: no  Dietary Habits and Nutritional Risks How many meals a  day?: (!) 1 (eats light breakfast) Eats fruit and vegetables daily?: yes Most meals are obtained by: preparing own meals Diabetic:: no  Functional Status Activities of Daily Living (to include ambulation/medication): Independent Ambulation: Independent Medication Administration: Independent Home Management: Independent Manage your own finances?: yes Primary transportation is: driving Concerns about vision?: no *vision screening is required for WTM* Concerns about hearing?: no  Fall Screening Falls in the past year?: 1 (fell while trout fishing on slippery rocks) Number of falls in past year: 0 Was there an injury with Fall?: 0 Fall Risk Category Calculator: 1 Patient Fall Risk Level: Low Fall Risk  Fall Risk Patient at Risk for Falls Due to: History of fall(s); Orthopedic patient Fall risk Follow up: Education provided  Home and Transportation Safety: All rugs have non-skid backing?: yes All stairs or steps have railings?: yes Grab bars in the bathtub or shower?: yes Have non-skid surface in bathtub or shower?: yes Good home lighting?: yes Regular seat belt use?: yes Hospital stays in the last year:: no  Cognitive Assessment Difficulty concentrating, remembering, or making decisions? : no Will 6CIT or Mini Cog be Completed: yes What year is it?: 0 points What month is it?: 0 points Give patient an address phrase to remember (5 components): [address] [place]  About what time is it?: 0 points Count backwards from 20 to 1: 0 points Say the months of the year in reverse: 0 points Repeat the address phrase from earlier: 0 points 6 CIT Score: 0 points  Advance Directives (For Healthcare) Does Patient Have a Medical Advance Directive?: Yes Does patient want to make changes to medical advance directive?: No - Guardian declined Type of Advance Directive: Healthcare Power of Attorney Copy of Healthcare Power of Attorney in Chart?: No - copy  requested  Reviewed/Updated  Reviewed/Updated: All        Objective:    Today's Vitals   09/24/24 1030  Weight: 178 lb (80.7 kg)  Height: 6' (1.829 m)   Body mass index is 24.14 kg/m.  Current Medications (verified) Outpatient Encounter Medications as of 09/24/2024  Medication Sig   atorvastatin  (LIPITOR) 20 MG tablet Take 1 tablet (20 mg total) by mouth at bedtime.   b complex vitamins capsule Take 1 capsule by mouth daily.   carvedilol  (COREG ) 6.25 MG tablet TAKE 1 TABLET BY MOUTH TWICE A DAY WITH A MEAL   cholecalciferol  (VITAMIN D3) 25 MCG (1000 UNIT) tablet Take 1,000 Units by mouth daily.   clonazePAM  (KLONOPIN ) 0.5 MG tablet TAKE 1 TABLET BY MOUTH 2 TIMES A DAY AS NEEDED FOR ANXIETY   folic acid  (FOLVITE ) 1 MG tablet Take 1 tablet (1 mg total) by mouth daily.   Melatonin 10 MG CAPS Take 10 mg by mouth at bedtime.   Multiple Vitamin (MULTIVITAMIN WITH MINERALS) TABS  Take 1 tablet by mouth daily.   niacinamide 500 MG tablet Take 500 mg by mouth 2 (two) times daily with a meal.   Pseudoeph-Doxylamine-DM-APAP (NYQUIL PO) Take by mouth. 30 mls q hs.   tadalafil  (CIALIS ) 10 MG tablet TAKE 1-2 TABLETS BY MOUTH EVERY OTHER DAY AS NEEDED   zolpidem  (AMBIEN ) 5 MG tablet Take 1 tablet (5 mg total) by mouth at bedtime as needed for sleep.   No facility-administered encounter medications on file as of 09/24/2024.   Hearing/Vision screen Hearing Screening - Comments:: Denies hearing difficulties.  Vision Screening - Comments:: Up to date with routine eye exams with Ginnie Pinal Immunizations and Health Maintenance Health Maintenance  Topic Date Due   COVID-19 Vaccine (10 - Pfizer risk 2025-26 season) 02/03/2025   Medicare Annual Wellness (AWV)  09/24/2025   Colonoscopy  05/17/2026   DTaP/Tdap/Td (3 - Td or Tdap) 10/12/2029   Pneumococcal Vaccine: 50+ Years  Completed   Influenza Vaccine  Completed   Hepatitis C Screening  Completed   Zoster Vaccines- Shingrix  Completed    Meningococcal B Vaccine  Aged Out        Assessment/Plan:  This is a routine wellness examination for Elsie.  Patient Care Team: Amon Aloysius BRAVO, MD as PCP - General Darlean Ozell NOVAK, MD as Consulting Physician (Pulmonary Disease) Tobie Gordy POUR, MD as Consulting Physician (Nephrology) Rosalie Kitchens, MD as Consulting Physician (Gastroenterology) Tobie Tonita POUR, DO as Consulting Physician (Neurology)  I have personally reviewed and noted the following in the patient's chart:   Medical and social history Use of alcohol, tobacco or illicit drugs  Current medications and supplements including opioid prescriptions. Functional ability and status Nutritional status Physical activity Advanced directives List of other physicians Hospitalizations, surgeries, and ER visits in previous 12 months Vitals Screenings to include cognitive, depression, and falls Referrals and appointments  No orders of the defined types were placed in this encounter.  In addition, I have reviewed and discussed with patient certain preventive protocols, quality metrics, and best practice recommendations. A written personalized care plan for preventive services as well as general preventive health recommendations were provided to patient.   Lolita Libra, CMA   09/24/2024   Return in 1 year (on 09/24/2025).  After Visit Summary: (MyChart) Due to this being a telephonic visit, the after visit summary with patients personalized plan was offered to patient via MyChart   Nurse Notes: nothing significant to report

## 2024-09-25 ENCOUNTER — Ambulatory Visit (HOSPITAL_BASED_OUTPATIENT_CLINIC_OR_DEPARTMENT_OTHER)
Admission: RE | Admit: 2024-09-25 | Discharge: 2024-09-25 | Disposition: A | Discharge status: Home / Self Care | Source: Ambulatory Visit | Attending: Emergency Medicine | Admitting: Emergency Medicine

## 2024-09-25 DIAGNOSIS — R9389 Abnormal findings on diagnostic imaging of other specified body structures: Secondary | ICD-10-CM | POA: Diagnosis not present

## 2024-09-25 DIAGNOSIS — J479 Bronchiectasis, uncomplicated: Secondary | ICD-10-CM | POA: Diagnosis not present

## 2024-09-25 DIAGNOSIS — R911 Solitary pulmonary nodule: Secondary | ICD-10-CM | POA: Diagnosis not present

## 2024-09-25 DIAGNOSIS — I700 Atherosclerosis of aorta: Secondary | ICD-10-CM | POA: Diagnosis not present

## 2024-09-27 ENCOUNTER — Other Ambulatory Visit: Payer: Self-pay | Admitting: Internal Medicine

## 2024-09-30 NOTE — Telephone Encounter (Signed)
 Weight updated in 09/24/24 wellness visit per pt request.

## 2024-09-30 NOTE — Progress Notes (Signed)
 Please attest this visit in the absence of patient primary care provider.    I connected with  Bobby [name] on 09/24/24 by a audio enabled telemedicine application and verified that I am speaking with the correct person using two identifiers.  Patient Location: Home  Provider Location: Office/Clinic  I discussed the limitations of evaluation and management by telemedicine. The patient expressed understanding and agreed to proceed.  Subjective:   Bobby [name] is a 73 y.o. male who presents for a Medicare Annual Wellness Visit.  Allergies (verified) Tramadol   History: Past Medical History:  Diagnosis Date   Anxiety and depression    Hypertension    Hyponatremia 03/2013   ongoing, seeing Dr. Tobie   Insomnia    Melanoma in situ of back [facility])    biopsy on 03/05/2020   SIADH (syndrome of inappropriate ADH production) 2017   Dr. Tobie   Squamous cell carcinoma in situ    Past Surgical History:  Procedure Laterality Date   BRONCHIAL BIOPSY  11/27/2022   Procedure: BRONCHIAL BIOPSIES;  Surgeon: Shelah Lamar RAMAN, MD;  Location: [facility] ENDOSCOPY;  Service: Cardiopulmonary;;   BRONCHIAL BRUSHINGS  11/27/2022   Procedure: BRONCHIAL BRUSHINGS;  Surgeon: Shelah Lamar RAMAN, MD;  Location: [facility] ENDOSCOPY;  Service: Cardiopulmonary;;   BRONCHIAL WASHINGS  11/27/2022   Procedure: BRONCHIAL WASHINGS;  Surgeon: Shelah Lamar RAMAN, MD;  Location: [name];  Service: Cardiopulmonary;;   MOHS SURGERY  ~ 04/2020   face, [facility]   SHOULDER SURGERY  1980s   right shoulder   TRANSFORAMINAL LUMBAR INTERBODY FUSION (TLIF) WITH PEDICLE SCREW FIXATION 1 LEVEL Left 05/17/2023   Procedure: LEFT-SIDED LUMBAR 4- LUMBAR 5 TRANSFORAMINAL LUMBAR INTERBODY FUSION AND DECOMPRESSION WITH INSTRUMENTATION AND ALLOGRAFT;  Surgeon: Beuford Anes, MD;  Location: [facility] OR;  Service: Orthopedics;  Laterality: Left;   VIDEO BRONCHOSCOPY N/A 11/27/2022   Procedure: VIDEO BRONCHOSCOPY WITH FLUORO;  Surgeon: Shelah Lamar RAMAN, MD;   Location: [facility] ENDOSCOPY;  Service: Cardiopulmonary;  Laterality: N/A;   Family History  Problem Relation Age of Onset   Hypertension Mother    Stroke Mother    Diabetes Mother    Emphysema Mother        smoked   Thyroid  disease Mother    Alcoholism Father    Lung cancer Maternal Grandfather    Colon cancer Neg Hx    Prostate cancer Neg Hx    CAD Neg Hx    Social History   Occupational History   Occupation: insurance account manager   Tobacco Use   Smoking status: Never   Smokeless tobacco: Never  Vaping Use   Vaping status: Never Used  Substance and Sexual Activity   Alcohol use: Not Currently   Drug use: No   Sexual activity: Yes   Tobacco Counseling Counseling given: Not Answered  SDOH Screenings   Food Insecurity: No Food Insecurity (09/24/2024)  Housing: Low Risk  (09/24/2024)  Transportation Needs: No Transportation Needs (09/24/2024)  Utilities: Not At Risk (09/24/2024)  Alcohol Screen: Low Risk  (08/22/2023)  Depression (PHQ2-9): Low Risk  (09/24/2024)  Financial Resource Strain: Low Risk  (08/22/2023)  Physical Activity: Insufficiently Active (09/24/2024)  Social Connections: Unknown (09/24/2024)  Stress: No Stress Concern Present (09/24/2024)  Tobacco Use: Low Risk  (09/24/2024)  Health Literacy: Adequate Health Literacy (09/24/2024)   Depression Screen    09/24/2024    1:50 PM 09/24/2024   10:42 AM 08/27/2024    2:15 PM 07/07/2024    1:42 PM 02/27/2024  9:53 AM 08/22/2023    9:07 AM 02/21/2023   10:11 AM  PHQ 2/9 Scores  PHQ - 2 Score 0 0 0 0 0 0 0  PHQ- 9 Score 0  0  0          Data saved with a previous flowsheet row definition     Goals Addressed             This Visit's Progress    Patient Stated   On track    Start bike riding & lift weights       Visit info / Clinical Intake: Medicare Wellness Visit Type:: Subsequent Annual Wellness Visit Persons participating in visit:: patient Medicare Wellness Visit Mode:: Telephone If telephone::  video declined Because this visit was a virtual/telehealth visit:: pt reported vitals Information given by:: patient Interpreter Needed?: No Pre-visit prep was completed: yes AWV questionnaire completed by patient prior to visit?: no Living arrangements:: lives with spouse/significant other Patient's Overall Health Status Rating: excellent Typical amount of pain: some (very little to none) Does pain affect daily life?: no Are you currently prescribed opioids?: no  Dietary Habits and Nutritional Risks How many meals a day?: (!) 1 (eats light breakfast) Eats fruit and vegetables daily?: yes Most meals are obtained by: preparing own meals In the last 2 weeks, have you had any of the following?: none Diabetic:: no  Functional Status Activities of Daily Living (to include ambulation/medication): Independent Ambulation: Independent Medication Administration: Independent Home Management: Independent Manage your own finances?: yes Primary transportation is: driving Concerns about vision?: no *vision screening is required for WTM* Concerns about hearing?: no  Fall Screening Falls in the past year?: 1 (fell while trout fishing on slippery rocks) Number of falls in past year: 0 Was there an injury with Fall?: 0 Fall Risk Category Calculator: 1 Patient Fall Risk Level: Low Fall Risk  Fall Risk Patient at Risk for Falls Due to: History of fall(s); Orthopedic patient Fall risk Follow up: Education provided  Home and Transportation Safety: All rugs have non-skid backing?: yes All stairs or steps have railings?: yes Grab bars in the bathtub or shower?: yes Have non-skid surface in bathtub or shower?: yes Good home lighting?: yes Regular seat belt use?: yes Hospital stays in the last year:: no  Cognitive Assessment Difficulty concentrating, remembering, or making decisions? : no Will 6CIT or Mini Cog be Completed: yes What year is it?: 0 points What month is it?: 0 points Give  patient an address phrase to remember (5 components): [address] [place]  About what time is it?: 0 points Count backwards from 20 to 1: 0 points Say the months of the year in reverse: 0 points Repeat the address phrase from earlier: 0 points 6 CIT Score: 0 points  Advance Directives (For Healthcare) Does Patient Have a Medical Advance Directive?: Yes Does patient want to make changes to medical advance directive?: No - Guardian declined Type of Advance Directive: Healthcare Power of Attorney Copy of Healthcare Power of Attorney in Chart?: No - copy requested  Reviewed/Updated  Reviewed/Updated: All        Objective:    Today's Vitals   09/24/24 1030  Weight: 178 lb (80.7 kg)  Height: 6' (1.829 m)   Body mass index is 24.14 kg/m.  Current Medications (verified) Outpatient Encounter Medications as of 09/24/2024  Medication Sig   atorvastatin  (LIPITOR) 20 MG tablet Take 1 tablet (20 mg total) by mouth at bedtime.   b complex vitamins capsule  Take 1 capsule by mouth daily.   carvedilol  (COREG ) 6.25 MG tablet TAKE 1 TABLET BY MOUTH TWICE A DAY WITH A MEAL   cholecalciferol  (VITAMIN D3) 25 MCG (1000 UNIT) tablet Take 1,000 Units by mouth daily.   folic acid  (FOLVITE ) 1 MG tablet Take 1 tablet (1 mg total) by mouth daily.   Melatonin 10 MG CAPS Take 10 mg by mouth at bedtime.   Multiple Vitamin (MULTIVITAMIN WITH MINERALS) TABS Take 1 tablet by mouth daily.   niacinamide 500 MG tablet Take 500 mg by mouth 2 (two) times daily with a meal.   Pseudoeph-Doxylamine-DM-APAP (NYQUIL PO) Take by mouth. 30 mls q hs.   tadalafil  (CIALIS ) 10 MG tablet TAKE 1-2 TABLETS BY MOUTH EVERY OTHER DAY AS NEEDED   zolpidem  (AMBIEN ) 5 MG tablet Take 1 tablet (5 mg total) by mouth at bedtime as needed for sleep.   [DISCONTINUED] clonazePAM  (KLONOPIN ) 0.5 MG tablet TAKE 1 TABLET BY MOUTH 2 TIMES A DAY AS NEEDED FOR ANXIETY   No facility-administered encounter medications on file as of  09/24/2024.   Hearing/Vision screen Hearing Screening - Comments:: Denies hearing difficulties.  Vision Screening - Comments:: Up to date with routine eye exams with Ginnie Pinal Immunizations and Health Maintenance Health Maintenance  Topic Date Due   COVID-19 Vaccine (10 - Pfizer risk 2025-26 season) 02/03/2025   Medicare Annual Wellness (AWV)  09/24/2025   Colonoscopy  05/17/2026   DTaP/Tdap/Td (3 - Td or Tdap) 10/12/2029   Pneumococcal Vaccine: 50+ Years  Completed   Influenza Vaccine  Completed   Hepatitis C Screening  Completed   Zoster Vaccines- Shingrix  Completed   Meningococcal B Vaccine  Aged Out        Assessment/Plan:  This is a routine wellness examination for Bobby.  Patient Care Team: Amon Aloysius BRAVO, MD as PCP - General Darlean Ozell NOVAK, MD as Consulting Physician (Pulmonary Disease) Tobie Gordy POUR, MD as Consulting Physician (Nephrology) Rosalie Kitchens, MD as Consulting Physician (Gastroenterology) Tobie Tonita POUR, DO as Consulting Physician (Neurology)  I have personally reviewed and noted the following in the patient's chart:   Medical and social history Use of alcohol, tobacco or illicit drugs  Current medications and supplements including opioid prescriptions. Functional ability and status Nutritional status Physical activity Advanced directives List of other physicians Hospitalizations, surgeries, and ER visits in previous 12 months Vitals Screenings to include cognitive, depression, and falls Referrals and appointments  No orders of the defined types were placed in this encounter.  In addition, I have reviewed and discussed with patient certain preventive protocols, quality metrics, and best practice recommendations. A written personalized care plan for preventive services as well as general preventive health recommendations were provided to patient.   Lolita Libra, CMA   09/30/2024   Return in 1 year (on 09/24/2025).  After Visit Summary:  (MyChart) Due to this being a telephonic visit, the after visit summary with patients personalized plan was offered to patient via MyChart   Nurse Notes: nothing significant to report

## 2024-10-07 ENCOUNTER — Ambulatory Visit: Admitting: Neurology

## 2024-10-07 ENCOUNTER — Encounter: Payer: Self-pay | Admitting: Neurology

## 2024-10-07 VITALS — BP 143/71 | HR 61 | Ht 72.0 in | Wt 174.0 lb

## 2024-10-07 DIAGNOSIS — M5417 Radiculopathy, lumbosacral region: Secondary | ICD-10-CM | POA: Diagnosis not present

## 2024-10-07 DIAGNOSIS — R4189 Other symptoms and signs involving cognitive functions and awareness: Secondary | ICD-10-CM

## 2024-10-07 NOTE — Progress Notes (Signed)
 Follow-up Visit   Date: 10/07/2024    Bobby [name] MRN: [id number] DOB: 02/01/1951    Bobby [name] is a 73 y.o. right-handed Caucasian male with  B-cell lymphocytosis, hyperlipidemia, hyponatremia, s/p lumbar decompression at L4-L5 returning to the clinic for follow-up of unsteady gait and cognitive changes.  The patient was accompanied to the clinic by wife who also provides collateral information.    IMPRESSION/PLAN:  Unsteady gait due to residual deficits from severity of lumbar canal stenosis s/p decompression at L4-L5.  NCS/EMG does not show neuropathy, there is chronic radiculopathy at L5-S1 bilaterally  - Continue home balance exercises  - Fall precautions discussed  Cognitive changes with memory loss.  MRI brain was reviewed which shows mild generalized volume loss.  Vitamin B12 and TSH is normal - neuropsychological testing declined at this time - brain healthy activities and diet recommended  Return to clinic as needed  --------------------------------------------- History of present illness: He has history of back surgery in June 2024 by Dr. Beuford and completed PT before and after this.  He reports that balance is better when he does PT, but then gets worse again.  He has not been compliant with staying active or doing home exercises.  He has fallen 4 times, which mostly seem mechanical.  His wife says that he tends to take much smaller steps and can hear his feet catching the floor at home.    She has also noticed that his more forgetful which is very atypical for him.  On one occasion, he asked her what dumbells are.  He is able to manage IADLs and ADLs.  He does not feel that there is any memory issues.     He own an office building.  Nonsmoker.  He drinks several alcoholic beverages daily for many years.    UPDATE 10/07/2024:  He is here for follow-up visit.    His EMG indicated lumbosacral radiculopathy affecting L5-S1 nerve roots bilaterally, no  evidence of neuropathy.  He completed physical therapy for balance which has helped, although admits that he is not complaint with his exercises. He continues to walk unassisted.   For his memory changes, he had MRI brain which shows mild generalized atrophy.  He did not want to pursue neuropsychological testing due to duration of visit and thus, it was cancelled.  He denies having any significant change in memory, however, wife continues to note that he tends to forget details of conversation, appointments, or things such as where the car was parked.    Medications:  Current Outpatient Medications on File Prior to Visit  Medication Sig Dispense Refill   atorvastatin  (LIPITOR) 20 MG tablet Take 1 tablet (20 mg total) by mouth at bedtime. 90 tablet 1   b complex vitamins capsule Take 1 capsule by mouth daily.     carvedilol  (COREG ) 6.25 MG tablet TAKE 1 TABLET BY MOUTH TWICE A DAY WITH A MEAL 180 tablet 3   cholecalciferol  (VITAMIN D3) 25 MCG (1000 UNIT) tablet Take 1,000 Units by mouth daily.     clonazePAM  (KLONOPIN ) 0.5 MG tablet TAKE 1 TABLET BY MOUTH 2 TIMES A DAY AS NEEDED FOR ANXIETY 60 tablet 1   folic acid  (FOLVITE ) 1 MG tablet Take 1 tablet (1 mg total) by mouth daily. 90 tablet 2   Melatonin 10 MG CAPS Take 10 mg by mouth at bedtime.     Multiple Vitamin (MULTIVITAMIN WITH MINERALS) TABS Take 1 tablet by mouth daily.  niacinamide 500 MG tablet Take 500 mg by mouth 2 (two) times daily with a meal.     Pseudoeph-Doxylamine-DM-APAP (NYQUIL PO) Take by mouth. 30 mls q hs.     tadalafil  (CIALIS ) 10 MG tablet TAKE 1-2 TABLETS BY MOUTH EVERY OTHER DAY AS NEEDED 20 tablet 3   zolpidem  (AMBIEN ) 5 MG tablet Take 1 tablet (5 mg total) by mouth at bedtime as needed for sleep. 30 tablet 1   No current facility-administered medications on file prior to visit.    Allergies:  Allergies  Allergen Reactions   Tramadol Hives    Vital Signs:  BP (!) 143/71   Pulse 61   Ht 6' (1.829 m)   Wt  174 lb (78.9 kg)   SpO2 99%   BMI 23.60 kg/m    Neurological Exam: MENTAL STATUS including orientation to time, place, person, recent and remote memory, attention span and concentration, language, and fund of knowledge is fairly intact.  He is slightly tangential with thought process.  Speech is not dysarthric.    06/10/2024   11:44 AM  Montreal Cognitive Assessment   Visuospatial/ Executive (0/5) 3  Naming (0/3) 3  Attention: Read list of digits (0/2) 2  Attention: Read list of letters (0/1) 1  Attention: Serial 7 subtraction starting at 100 (0/3) 1  Language: Repeat phrase (0/2) 2  Language : Fluency (0/1) 0  Abstraction (0/2) 2  Delayed Recall (0/5) 1  Orientation (0/6) 6  Total 21  Adjusted Score (based on education) 21     CRANIAL NERVES:  No visual field defects.  Pupils equal round and reactive to light.  Normal conjugate, extra-ocular eye movements in all directions of gaze.  No ptosis.  Face is symmetric.   MOTOR:  Motor strength is 5/5 in all extremities.  No atrophy, fasciculations or abnormal movements.  No pronator drift.  Tone is normal.    MSRs:  Reflexes are 2+/4 throughout.  SENSORY:  Intact to vibration throughout.  COORDINATION/GAIT:  Normal finger-to- nose-finger.  Intact rapid alternating movements bilaterally.  Gait narrow mildly wide-based and stable.   Data: NCS/EMG of the legs 07/24/2024: Chronic L5-S1 radiculopathy affecting bilateral lower extremities, moderate. There is no evidence of a large fiber sensorimotor polyneuropathy affecting the lower extremities.  MRI brain wo contrast 07/01/2024: No significant abnormality   MRI lumbar spine wo contrast 02/10/2023: 1. At L4-5 there is a broad-based disc bulge. Severe bilateral facet arthropathy and ligamentum flavum infolding. Severe spinal stenosis. No left foraminal stenosis. Severe right foraminal stenosis. 2. At L5-S1 there is a broad-based disc osteophyte complex. Moderate bilateral facet  arthropathy with a small right facet effusion. Severe left foraminal stenosis. Mild right foraminal stenosis. 3. At L3-4 there is a broad-based disc bulge. Moderate bilateral facet arthropathy. Mild spinal stenosis. Moderate right and mild left foraminal stenosis. 4. No acute osseous injury of the lumbar spine.  Lab Results  Component Value Date   FOLATE >24.0 06/10/2024   Lab Results  Component Value Date   VITAMINB12 576 06/10/2024   Total time spent reviewing records, interview, history/exam, documentation, and coordination of care on day of encounter:  30 minutes    Thank you for allowing me to participate in patient's care.  If I can answer any additional questions, I would be pleased to do so.    Sincerely,    Glenisha Gundry K. Tobie, DO

## 2024-10-07 NOTE — Patient Instructions (Signed)
 If you would like to schedule neuropsychological testing, please call the office at [phone].  Continue home exercises

## 2024-10-20 DIAGNOSIS — D492 Neoplasm of unspecified behavior of bone, soft tissue, and skin: Secondary | ICD-10-CM | POA: Diagnosis not present

## 2024-10-20 DIAGNOSIS — L821 Other seborrheic keratosis: Secondary | ICD-10-CM | POA: Diagnosis not present

## 2024-10-20 DIAGNOSIS — L538 Other specified erythematous conditions: Secondary | ICD-10-CM | POA: Diagnosis not present

## 2024-10-20 DIAGNOSIS — D225 Melanocytic nevi of trunk: Secondary | ICD-10-CM | POA: Diagnosis not present

## 2024-10-20 DIAGNOSIS — Z85828 Personal history of other malignant neoplasm of skin: Secondary | ICD-10-CM | POA: Diagnosis not present

## 2024-10-20 DIAGNOSIS — L814 Other melanin hyperpigmentation: Secondary | ICD-10-CM | POA: Diagnosis not present

## 2024-10-20 DIAGNOSIS — Z85820 Personal history of malignant melanoma of skin: Secondary | ICD-10-CM | POA: Diagnosis not present

## 2024-10-20 DIAGNOSIS — Z08 Encounter for follow-up examination after completed treatment for malignant neoplasm: Secondary | ICD-10-CM | POA: Diagnosis not present

## 2024-10-20 DIAGNOSIS — L2089 Other atopic dermatitis: Secondary | ICD-10-CM | POA: Diagnosis not present

## 2024-11-06 ENCOUNTER — Encounter: Payer: Self-pay | Admitting: Emergency Medicine

## 2024-11-06 ENCOUNTER — Ambulatory Visit: Admitting: Emergency Medicine

## 2024-11-06 VITALS — BP 128/87 | HR 58 | Wt 170.0 lb

## 2024-11-06 DIAGNOSIS — J479 Bronchiectasis, uncomplicated: Secondary | ICD-10-CM

## 2024-11-06 DIAGNOSIS — R911 Solitary pulmonary nodule: Secondary | ICD-10-CM

## 2024-11-06 DIAGNOSIS — R9389 Abnormal findings on diagnostic imaging of other specified body structures: Secondary | ICD-10-CM

## 2024-11-06 DIAGNOSIS — R918 Other nonspecific abnormal finding of lung field: Secondary | ICD-10-CM

## 2024-11-06 NOTE — Patient Instructions (Signed)
 We reviewed your CT scan of the chest today.  There is a slight increase in some inflammatory change compared with your prior.  We will continue to follow this. We will repeat your CT scan of the chest in May 2026. Follow Dr. Shelah in May so we can review that scan and determine whether any other workup or evaluation will be appropriate. Follow with Dr. Timmy as planned Please notify us  if you have any changes in your breathing, cough, mucus production or any fevers, chills.  If so we will evaluate you sooner.

## 2024-11-06 NOTE — Progress Notes (Signed)
 Subjective:    Patient ID: Bobby [name], male    DOB: Jan 16, 1951, 73 y.o.   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  He had a reassuring bronchoscopy 11/27/2022 with random transbronchial biopsies negative for granulomas.  He has subtle bronchiectasis and micronodular disease in a tree-in-bud pattern on his imaging, suspicious for Mycobacterium colonization although cultures negative. He feels well. No constitutional sx, no resp sx.   CT chest 10/10/2023 reviewed by me shows no significant change in scattered pulmonary nodules in the right lung up to 6 mm.  No new nodules.  The bronchiectasis and scattered tree-in-bud opacities are unchanged on the right, resolved on the left.  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  He follows with Bobby [name].  He underwent bronchoscopy 11/2022 with random transbronchial biopsies that were negative for granulomas.  He has subtle bronchiectasis and micronodular disease on CT chest suspicious for Mycobacterium colonization although his cultures were negative.  We have been following serial imaging and clinical status.  He is getting over a URI, feeling better but still some residual nasal gtt. His energy is better. No fevers. He is active, good functional capacity.   CT chest 09/25/2024 reviewed by me showed scattered mild bronchiectatic change with some slightly increased prominence of peribronchovascular nodularity and ground glass.  The dominant ground glass nodule in the anterior segment of the right upper lobe is 11 mm, new from last year.   Review of Systems As per HPI  Past Medical History:  Diagnosis Date   Anxiety and depression    Hypertension    Hyponatremia 03/2013    ongoing, seeing Dr. Tobie   Insomnia    Melanoma in situ of back [facility])    biopsy on 03/05/2020   SIADH (syndrome of inappropriate ADH production) 2017   Dr. Tobie   Squamous cell carcinoma in situ      Family History  Problem Relation Age of Onset   Hypertension Mother    Stroke Mother    Diabetes Mother    Emphysema Mother        smoked   Thyroid  disease Mother    Alcoholism Father    Lung cancer Maternal Grandfather    Colon cancer Neg Hx    Prostate cancer Neg Hx    CAD Neg Hx      Social History   Socioeconomic History   Marital status: Married    Spouse name: Not on file   Number of children: 2   Years of education: 16   Highest education level: Bachelor's degree (e.g., BA, AB, BS)  Occupational History   Occupation: semi-retired--business owner   Tobacco Use   Smoking status: Never   Smokeless tobacco: Never  Vaping Use   Vaping status: Never Used  Substance and Sexual Activity   Alcohol use: Not Currently   Drug use: No   Sexual activity: Yes  Other Topics Concern   Not on file  Social History Narrative   Bobby [name], married    2 children   mom passed away 06/17/08  Right-handed.   2-3 cups coffee per day.   Lives at home with wife.      Are you right handed or left handed? Right Handed   Are you currently  employed ? 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 No    Who lives with you? Wife    What type of home do you live in: 1 story or 2 story? Lives in a 3 story home              Social Drivers of Health   Tobacco Use: Low Risk (11/06/2024)   Patient History    Smoking Tobacco Use: Never    Smokeless Tobacco Use: Never    Passive Exposure: Not on file  Financial Resource Strain: Low Risk (08/22/2023)   Overall Financial Resource Strain (CARDIA)    Difficulty of Paying Living Expenses: Not hard at all  Food Insecurity: No Food Insecurity (09/24/2024)   Epic    Worried About Programme Researcher, Broadcasting/film/video in  the Last Year: Never true    Ran Out of Food in the Last Year: Never true  Transportation Needs: No Transportation Needs (09/24/2024)   Epic    Lack of Transportation (Medical): No    Lack of Transportation (Non-Medical): No  Physical Activity: Insufficiently Active (09/24/2024)   Exercise Vital Sign    Days of Exercise per Week: 4 days    Minutes of Exercise per Session: 30 min  Stress: No Stress Concern Present (09/24/2024)   [employer] of Occupational Health - Occupational Stress Questionnaire    Feeling of Stress: Not at all  Social Connections: Unknown (09/24/2024)   Social Connection and Isolation Panel    Frequency of Communication with Friends and Family: More than three times a week    Frequency of Social Gatherings with Friends and Family: Three times a week    Attends Religious Services: Never    Active Member of Clubs or Organizations: Patient declined    Attends Banker Meetings: Patient declined    Marital Status: Married  Catering Manager Violence: Not At Risk (09/24/2024)   Epic    Fear of Current or Ex-Partner: No    Emotionally Abused: No    Physically Abused: No    Sexually Abused: No  Depression (PHQ2-9): Low Risk (09/24/2024)   Depression (PHQ2-9)    PHQ-2 Score: 0  Alcohol Screen: Low Risk (08/22/2023)   Alcohol Screen    Last Alcohol Screening Score (AUDIT): 3  Housing: Low Risk (09/24/2024)   Epic    Unable to Pay for Housing in the Last Year: No    Number of Times Moved in the Last Year: 0    Homeless in the Last Year: No  Utilities: Not At Risk (09/24/2024)   Epic    Threatened with loss of utilities: No  Health Literacy: Adequate Health Literacy (09/24/2024)   B1300 Health Literacy    Frequency of need for help with medical instructions: Never     Allergies  Allergen Reactions   Tramadol Hives     Outpatient Medications Prior to Visit  Medication Sig Dispense Refill   atorvastatin  (LIPITOR) 20 MG tablet Take 1 tablet (20 mg  total) by mouth at bedtime. 90 tablet 1   b complex vitamins capsule Take 1 capsule by mouth daily.     carvedilol  (COREG ) 6.25 MG tablet TAKE 1 TABLET BY MOUTH TWICE A DAY WITH A MEAL 180 tablet 3   cholecalciferol  (VITAMIN D3) 25 MCG (1000 UNIT) tablet Take 1,000 Units by mouth daily.     clonazePAM  (KLONOPIN ) 0.5 MG tablet TAKE 1 TABLET BY MOUTH 2 TIMES  A DAY AS NEEDED FOR ANXIETY 60 tablet 1   folic acid  (FOLVITE ) 1 MG tablet Take 1 tablet (1 mg total) by mouth daily. 90 tablet 2   Melatonin 10 MG CAPS Take 10 mg by mouth at bedtime.     Multiple Vitamin (MULTIVITAMIN WITH MINERALS) TABS Take 1 tablet by mouth daily.     niacinamide 500 MG tablet Take 500 mg by mouth 2 (two) times daily with a meal.     Pseudoeph-Doxylamine-DM-APAP (NYQUIL PO) Take by mouth. 30 mls q hs.     tadalafil  (CIALIS ) 10 MG tablet TAKE 1-2 TABLETS BY MOUTH EVERY OTHER DAY AS NEEDED 20 tablet 3   zolpidem  (AMBIEN ) 5 MG tablet Take 1 tablet (5 mg total) by mouth at bedtime as needed for sleep. (Patient not taking: Reported on 11/06/2024) 30 tablet 1   No facility-administered medications prior to visit.        Objective:   Physical Exam Vitals:   11/06/24 1526  BP: 128/87  Pulse: (!) 58  SpO2: 99%  Weight: 170 lb (77.1 kg)     Gen: Pleasant, well-nourished, in no distress,  normal affect  ENT: No lesions,  mouth clear,  oropharynx clear, no postnasal drip  Neck: No JVD, no stridor  Lungs: No use of accessory muscles, no crackles or wheezing on normal respiration, no wheeze on forced expiration  Cardiovascular: RRR, heart sounds normal, no murmur or gallops, no peripheral edema  Musculoskeletal: No deformities, no cyanosis or clubbing  Neuro: alert, awake, non focal  Skin: Warm, no lesions or rash      Assessment & Plan:   Abnormal CT of the chest Subtle increase in the parabronchial vascular inflammatory changes, bronchiectatic changes.  Also with a new more distinct right upper lobe  ground glass pulmonary nodule.  Unclear significance but we do need to follow.  His cultures on bronchoscopy 11/2022 were negative for Mycobacterium avium but CT is suspicious and we will repeat at the 31-month mark given the new findings.  If he has progression on CT or if he has clinical progression then we will consider repeat bronchoscopy.   I personally spent a total of 30 minutes in the care of the patient today including preparing to see the patient, getting/reviewing separately obtained history, performing a medically appropriate exam/evaluation, counseling and educating, placing orders, documenting clinical information in the EHR, independently interpreting results, and communicating results.   Lamar Chris, MD, PhD 11/06/2024, 3:53 PM [facility] [phone] or if no answer before 7:00PM call [phone] For any issues after 7:00PM please call eLink [phone]

## 2024-11-06 NOTE — Assessment & Plan Note (Signed)
 Subtle increase in the parabronchial vascular inflammatory changes, bronchiectatic changes.  Also with a new more distinct right upper lobe ground glass pulmonary nodule.  Unclear significance but we do need to follow.  His cultures on bronchoscopy 11/2022 were negative for Mycobacterium avium but CT is suspicious and we will repeat at the 25-month mark given the new findings.  If he has progression on CT or if he has clinical progression then we will consider repeat bronchoscopy.

## 2024-11-07 ENCOUNTER — Other Ambulatory Visit: Payer: Self-pay | Admitting: Internal Medicine

## 2024-12-10 ENCOUNTER — Telehealth: Payer: Self-pay

## 2024-12-10 MED ORDER — CLONAZEPAM 0.5 MG PO TABS: ORAL_TABLET | ORAL | 1 refills | Status: AC

## 2024-12-10 NOTE — Telephone Encounter (Signed)
 Requesting: clonazepam  0.5mg   Contract: 02/27/23 UDS: 08/27/24 Last Visit: 08/27/24 Next Visit: 03/02/25 Last Refill: 09/28/24 #60 and 30F   Please Advise

## 2024-12-10 NOTE — Telephone Encounter (Signed)
 PDMP okay, Rx sent

## 2024-12-17 NOTE — Therapy (Incomplete)
 " OUTPATIENT PHYSICAL THERAPY THORACOLUMBAR EVALUATION   Patient Name: Bobby [name] MRN: [id number] DOB:03/18/1951, 74 y.o., male Today's Date: 12/17/2024  END OF SESSION:   Past Medical History:  Diagnosis Date   Anxiety and depression    Hypertension    Hyponatremia 03/2013   ongoing, seeing Dr. Tobie   Insomnia    Melanoma in situ of back [facility])    biopsy on 03/05/2020   SIADH (syndrome of inappropriate ADH production) 2017   Dr. Tobie   Squamous cell carcinoma in situ    Past Surgical History:  Procedure Laterality Date   BRONCHIAL BIOPSY  11/27/2022   Procedure: BRONCHIAL BIOPSIES;  Surgeon: Shelah Lamar RAMAN, MD;  Location: [facility] ENDOSCOPY;  Service: Cardiopulmonary;;   BRONCHIAL BRUSHINGS  11/27/2022   Procedure: BRONCHIAL BRUSHINGS;  Surgeon: Shelah Lamar RAMAN, MD;  Location: [facility] ENDOSCOPY;  Service: Cardiopulmonary;;   BRONCHIAL WASHINGS  11/27/2022   Procedure: BRONCHIAL WASHINGS;  Surgeon: Shelah Lamar RAMAN, MD;  Location: [name];  Service: Cardiopulmonary;;   MOHS SURGERY  ~ 04/2020   face, [facility]   SHOULDER SURGERY  1980s   right shoulder   TRANSFORAMINAL LUMBAR INTERBODY FUSION (TLIF) WITH PEDICLE SCREW FIXATION 1 LEVEL Left 05/17/2023   Procedure: LEFT-SIDED LUMBAR 4- LUMBAR 5 TRANSFORAMINAL LUMBAR INTERBODY FUSION AND DECOMPRESSION WITH INSTRUMENTATION AND ALLOGRAFT;  Surgeon: Beuford Anes, MD;  Location: [facility] OR;  Service: Orthopedics;  Laterality: Left;   VIDEO BRONCHOSCOPY N/A 11/27/2022   Procedure: VIDEO BRONCHOSCOPY WITH FLUORO;  Surgeon: Shelah Lamar RAMAN, MD;  Location: [facility] ENDOSCOPY;  Service: Cardiopulmonary;  Laterality: N/A;   Patient Active Problem List   Diagnosis Date Noted   Radiculopathy of lumbar region 05/17/2023   CLL (chronic lymphocytic leukemia) (HCC) 02/21/2023   Abnormal CT of the chest 11/01/2022   Melanoma in situ of back (HCC) 03/16/2020   Squamous cell carcinoma in situ 08/21/2019   Vitamin D  deficiency 05/29/2019   PCP NOTES >>>> 09/28/2015    Hypogonadism in male 05/13/2015   SIADH (syndrome of inappropriate ADH production) (HCC) 04/13/2013   MCI (mild cognitive impairment) with memory loss 04/13/2013   Alcohol abuse 04/13/2013   Annual physical exam 03/06/2012   Idiopathic scoliosis and kyphoscoliosis 08/18/2010   Anxiety state 04/01/2009   Essential hypertension, benign 01/04/2009   Insomnia 09/08/2008    PCP: Amon Aloysius BRAVO, MD   REFERRING PROVIDER: Beuford Anes, MD   REFERRING DIAG: [phone] (ICD-10-CM) - Fusion of spine, lumbar region  THERAPY DIAG:  No diagnosis found.  RATIONALE FOR EVALUATION AND TREATMENT: Rehabilitation  ONSET DATE: ***  NEXT MD VISIT: ***   SUBJECTIVE:  SUBJECTIVE STATEMENT: 74 y/o patient referred to PT from Dr Beuford for core and lumbar strengthening s/p L4/5 back fusion surgery in 6/24.    PAIN: Are you having pain? Yes: NPRS scale: *** Pain location: *** Pain description: *** Aggravating factors: *** Relieving factors: ***  PERTINENT HISTORY:  Cognitive impairement with memory loss, CLL, HTN, anxiety, depression, insomnia  PRECAUTIONS: {Therapy precautions:24002}  RED FLAGS: {PT Red Flags:29287}  WEIGHT BEARING RESTRICTIONS: No  FALLS:  Has patient fallen in last 6 months? {fallsyesno:27318}  LIVING ENVIRONMENT: Lives with: {[facility] lives with:25569::lives with their family} Lives in: {Lives in:25570} Stairs: {opstairs:27293} Has following equipment at home: {Assistive devices:23999}  OCCUPATION: ***  PLOF: {PLOF:24004}  PATIENT GOALS: ***   OBJECTIVE: (objective measures completed at initial evaluation unless otherwise dated)  DIAGNOSTIC FINDINGS:  ***  PATIENT SURVEYS:  Modified Oswestry:  MODIFIED OSWESTRY DISABILITY SCALE  Date: eval Score  Pain  intensity {ODI 1:32962}  2. Personal care (washing, dressing, etc.) {ODI 2:32963}  3. Lifting {ODI 3:32964}  4. Walking {ODI 4:32965}  5. Sitting {ODI 5:32966}  6. Standing {ODI 6:32967}  7. Sleeping {ODI 7:32968}  8. Social Life {ODI 8:32969}  9. Traveling {ODI 9:32970}  10. Employment/ Homemaking {ODI 10:32971}  Total ***/50   Interpretation of scores: Score Category Description  0-20% Minimal Disability The patient can cope with most living activities. Usually no treatment is indicated apart from advice on lifting, sitting and exercise  21-40% Moderate Disability The patient experiences more pain and difficulty with sitting, lifting and standing. Travel and social life are more difficult and they may be disabled from work. Personal care, sexual activity and sleeping are not grossly affected, and the patient can usually be managed by conservative means  41-60% Severe Disability Pain remains the main problem in this group, but activities of daily living are affected. These patients require a detailed investigation  61-80% Crippled Back pain impinges on all aspects of the patients life. Positive intervention is required  81-100% Bed-bound These patients are either bed-bound or exaggerating their symptoms  Bluford FORBES Zoe DELENA Karon DELENA, et al. Surgery versus conservative management of stable thoracolumbar fracture: the PRESTO feasibility RCT. [place] ([place]): [employer]; 2021 Nov. [facility] Technology Assessment, No. 25.62.) Appendix 3, Oswestry Disability Index category descriptors. Available from: Findjewelers.cz  Minimally Clinically Important Difference (MCID) = 12.8%  SCREENING FOR RED FLAGS: Bowel or bladder incontinence: {Yes/No:304960894} Spinal tumors: {Yes/No:304960894} Cauda equina syndrome: {Yes/No:304960894} Compression fracture: {Yes/No:304960894} Abdominal aneurysm: {Yes/No:304960894}  COGNITION:  Overall cognitive status:  {cognition:24006}    SENSATION: {sensation:27233}  POSTURE:  {posture:25561}  PALPATION: ***  LUMBAR ROM:   Active  Eval  Flexion   Extension   Right lateral flexion   Left lateral flexion   Right rotation   Left rotation   (Blank rows = not tested)  MUSCLE LENGTH: Hamstrings: Right *** deg; Left *** deg Thomas test: Right *** deg; Left *** deg Hamstrings: *** ITB: *** Piriformis: *** Hip flexors: *** Quads: *** Heelcord: ***  LOWER EXTREMITY ROM:     Active  Right eval Left eval  Hip flexion    Hip extension    Hip abduction    Hip adduction    Hip internal rotation    Hip external rotation    Knee flexion    Knee extension    Ankle dorsiflexion    Ankle plantarflexion    Ankle inversion    Ankle eversion    (Blank rows = not tested)  LOWER EXTREMITY MMT:  MMT Right eval Left eval  Hip flexion    Hip extension    Hip abduction    Hip adduction    Hip internal rotation    Hip external rotation    Knee flexion    Knee extension    Ankle dorsiflexion    Ankle plantarflexion    Ankle inversion    Ankle eversion     (Blank rows = not tested)  LUMBAR SPECIAL TESTS:  {lumbar special test:25242}  FUNCTIONAL TESTS:  {Functional tests:24029}  GAIT: Distance walked: *** Assistive device utilized: {Assistive devices:23999} Level of assistance: {Levels of assistance:24026} Gait pattern: {gait characteristics:25376} Comments: ***   TODAY'S TREATMENT:   SELF CARE: Provided education on PT POC progression.; initial HEP    PATIENT EDUCATION:  Education details: PT eval findings, anticipated POC, and initial HEP  Person educated: Patient Education method: Explanation, Demonstration, Verbal cues, Tactile cues, Handouts, and MedBridgeGO app access provided Education comprehension: verbalized understanding, verbal cues required, tactile cues required, and needs further education  HOME EXERCISE PROGRAM: ***   ASSESSMENT:  CLINICAL  IMPRESSION: Bobby [name] is a 74 y.o. male who was referred to physical therapy for evaluation and treatment for lumbar fusion in 6/24.     Patient reports onset of *** pain beginning ***. Pain is worse with ***.  Patient has deficits in *** ROM, *** LE flexibility, *** strength, abnormal posture, and TTP with abnormal muscle tension *** which are interfering with ADLs and are impacting quality of life.  On Modified Oswestry patient scored ***/50 demonstrating ***% or *** disability.  Bobby [name] will benefit from skilled PT to address above deficits to improve mobility and activity tolerance with decreased pain interference.   ***  OBJECTIVE IMPAIRMENTS: {opptimpairments:25111}.   ACTIVITY LIMITATIONS: {activitylimitations:27494}  PARTICIPATION LIMITATIONS: {participationrestrictions:25113}  PERSONAL FACTORS: Age, Time since onset of injury/illness/exacerbation, and 1-2 comorbidities: Cognitive impairement with memory loss, CLL, HTN, anxiety, depression, insomnia, SIADH are also affecting patient's functional outcome.   REHAB POTENTIAL: Good  CLINICAL DECISION MAKING: Evolving/moderate complexity  EVALUATION COMPLEXITY: Moderate   GOALS: Goals reviewed with patient? Yes  SHORT TERM GOALS: Target date: ***  Patient will be independent with initial HEP to improve outcomes and carryover.  Baseline: 100% PT assist required for correct completion Goal status: INITIAL  2.  Patient will report 25% improvement in low back pain to improve QOL. Baseline: *** Goal status: INITIAL    LONG TERM GOALS: Target date: ***  Patient will be independent with ongoing/advanced HEP for self-management at home.  Baseline: no advanced HEP yet Goal status: IN PROGRESS  2.  Patient will report 50-75% improvement in low back pain to improve QOL.  Baseline: *** Goal status: INITIAL  3.  Patient to demonstrate ability to achieve and maintain good spinal alignment/posturing and body mechanics  needed for daily activities. Baseline: *** Goal status: INITIAL  4.  Patient will demonstrate full pain free lumbar ROM to perform ADLs.   Baseline: Refer to above lumbar ROM table Goal status: INITIAL  5.  Patient will demonstrate improved BLE strength to >/= 5/5 for improved stability and ease of mobility. Baseline: Refer to above LE MMT table Goal status: INITIAL  6. Patient will report </= ***% on Modified Oswestry (MCID = 12%) to demonstrate improved functional ability with decreased pain interference. Baseline: *** Goal status: INITIAL  7.  Patient will tolerate *** min of (standing/sitting/walking) w/o increased pain to allow for *** improved mobility and activity tolerance. Baseline: *** Goal status: {GOALSTATUS:25110}  PLAN:  PT FREQUENCY: 1-2x/week  PT DURATION: 8 weeks  PLANNED INTERVENTIONS: 97164- PT Re-evaluation, 97750- Physical Performance Testing, 97110-Therapeutic exercises, 97530- Therapeutic activity, [id number]- Neuromuscular re-education, 97535- Self Care, 02859- Manual therapy, [id number]- Electrical stimulation (unattended), 97035- Ultrasound, 79439 (1-2 muscles), 20561 (3+ muscles)- Dry Needling, Patient/Family education, Taping, Spinal mobilization, Cryotherapy, and Moist heat  PLAN FOR NEXT SESSION: PIERRETTE RED SENIOR, PT 12/17/2024, 9:31 PM  "

## 2024-12-22 ENCOUNTER — Ambulatory Visit: Admitting: Rehabilitation

## 2024-12-26 NOTE — Therapy (Incomplete)
 " OUTPATIENT PHYSICAL THERAPY THORACOLUMBAR EVALUATION   Patient Name: Bobby [name] MRN: [id number] DOB:25-Jul-1951, 74 y.o., male Today's Date: 12/26/2024  END OF SESSION:   Past Medical History:  Diagnosis Date   Anxiety and depression    Hypertension    Hyponatremia 03/2013   ongoing, seeing Dr. Tobie   Insomnia    Melanoma in situ of back [facility])    biopsy on 03/05/2020   SIADH (syndrome of inappropriate ADH production) 2017   Dr. Tobie   Squamous cell carcinoma in situ    Past Surgical History:  Procedure Laterality Date   BRONCHIAL BIOPSY  11/27/2022   Procedure: BRONCHIAL BIOPSIES;  Surgeon: Shelah Lamar RAMAN, MD;  Location: [facility] ENDOSCOPY;  Service: Cardiopulmonary;;   BRONCHIAL BRUSHINGS  11/27/2022   Procedure: BRONCHIAL BRUSHINGS;  Surgeon: Shelah Lamar RAMAN, MD;  Location: [facility] ENDOSCOPY;  Service: Cardiopulmonary;;   BRONCHIAL WASHINGS  11/27/2022   Procedure: BRONCHIAL WASHINGS;  Surgeon: Shelah Lamar RAMAN, MD;  Location: [name];  Service: Cardiopulmonary;;   MOHS SURGERY  ~ 04/2020   face, [facility]   SHOULDER SURGERY  1980s   right shoulder   TRANSFORAMINAL LUMBAR INTERBODY FUSION (TLIF) WITH PEDICLE SCREW FIXATION 1 LEVEL Left 05/17/2023   Procedure: LEFT-SIDED LUMBAR 4- LUMBAR 5 TRANSFORAMINAL LUMBAR INTERBODY FUSION AND DECOMPRESSION WITH INSTRUMENTATION AND ALLOGRAFT;  Surgeon: Beuford Anes, MD;  Location: [facility] OR;  Service: Orthopedics;  Laterality: Left;   VIDEO BRONCHOSCOPY N/A 11/27/2022   Procedure: VIDEO BRONCHOSCOPY WITH FLUORO;  Surgeon: Shelah Lamar RAMAN, MD;  Location: [facility] ENDOSCOPY;  Service: Cardiopulmonary;  Laterality: N/A;   Patient Active Problem List   Diagnosis Date Noted   Radiculopathy of lumbar region 05/17/2023   CLL (chronic lymphocytic leukemia) (HCC) 02/21/2023   Abnormal CT of the chest 11/01/2022   Melanoma in situ of back (HCC) 03/16/2020   Squamous cell carcinoma in situ 08/21/2019   Vitamin D  deficiency 05/29/2019   PCP NOTES >>>> 09/28/2015    Hypogonadism in male 05/13/2015   SIADH (syndrome of inappropriate ADH production) (HCC) 04/13/2013   MCI (mild cognitive impairment) with memory loss 04/13/2013   Alcohol abuse 04/13/2013   Annual physical exam 03/06/2012   Idiopathic scoliosis and kyphoscoliosis 08/18/2010   Anxiety state 04/01/2009   Essential hypertension, benign 01/04/2009   Insomnia 09/08/2008    PCP: Amon Aloysius BRAVO, MD   REFERRING PROVIDER: Beuford Anes, MD   REFERRING DIAG: [phone] (ICD-10-CM) - Fusion of spine, lumbar region  THERAPY DIAG:  No diagnosis found.  RATIONALE FOR EVALUATION AND TREATMENT: Rehabilitation  ONSET DATE: ***  NEXT MD VISIT: ***   SUBJECTIVE:  SUBJECTIVE STATEMENT: 74 y/o patient referred to PT from Dr Beuford for core and lumbar strengthening s/p L4/5 back fusion surgery in 6/24.    PAIN: Are you having pain? Yes: NPRS scale: *** Pain location: *** Pain description: *** Aggravating factors: *** Relieving factors: ***  PERTINENT HISTORY:  Cognitive impairement with memory loss, CLL, HTN, anxiety, depression, insomnia  PRECAUTIONS: {Therapy precautions:24002}  RED FLAGS: {PT Red Flags:29287}  WEIGHT BEARING RESTRICTIONS: No  FALLS:  Has patient fallen in last 6 months? {fallsyesno:27318}  LIVING ENVIRONMENT: Lives with: {[facility] lives with:25569::lives with their family} Lives in: {Lives in:25570} Stairs: {opstairs:27293} Has following equipment at home: {Assistive devices:23999}  OCCUPATION: ***  PLOF: {PLOF:24004}  PATIENT GOALS: ***   OBJECTIVE: (objective measures completed at initial evaluation unless otherwise dated)  DIAGNOSTIC FINDINGS:  ***  PATIENT SURVEYS:  Modified Oswestry:  MODIFIED OSWESTRY DISABILITY SCALE  Date: eval Score  Pain  intensity {ODI 1:32962}  2. Personal care (washing, dressing, etc.) {ODI 2:32963}  3. Lifting {ODI 3:32964}  4. Walking {ODI 4:32965}  5. Sitting {ODI 5:32966}  6. Standing {ODI 6:32967}  7. Sleeping {ODI 7:32968}  8. Social Life {ODI 8:32969}  9. Traveling {ODI 9:32970}  10. Employment/ Homemaking {ODI 10:32971}  Total ***/50   Interpretation of scores: Score Category Description  0-20% Minimal Disability The patient can cope with most living activities. Usually no treatment is indicated apart from advice on lifting, sitting and exercise  21-40% Moderate Disability The patient experiences more pain and difficulty with sitting, lifting and standing. Travel and social life are more difficult and they may be disabled from work. Personal care, sexual activity and sleeping are not grossly affected, and the patient can usually be managed by conservative means  41-60% Severe Disability Pain remains the main problem in this group, but activities of daily living are affected. These patients require a detailed investigation  61-80% Crippled Back pain impinges on all aspects of the patients life. Positive intervention is required  81-100% Bed-bound These patients are either bed-bound or exaggerating their symptoms  Bluford FORBES Zoe DELENA Karon DELENA, et al. Surgery versus conservative management of stable thoracolumbar fracture: the PRESTO feasibility RCT. [place] ([place]): [employer]; 2021 Nov. [facility] Technology Assessment, No. 25.62.) Appendix 3, Oswestry Disability Index category descriptors. Available from: Findjewelers.cz  Minimally Clinically Important Difference (MCID) = 12.8%  SCREENING FOR RED FLAGS: Bowel or bladder incontinence: {Yes/No:304960894} Spinal tumors: {Yes/No:304960894} Cauda equina syndrome: {Yes/No:304960894} Compression fracture: {Yes/No:304960894} Abdominal aneurysm: {Yes/No:304960894}  COGNITION:  Overall cognitive status:  {cognition:24006}    SENSATION: {sensation:27233}  POSTURE:  {posture:25561}  PALPATION: ***  LUMBAR ROM:   Active  Eval  Flexion   Extension   Right lateral flexion   Left lateral flexion   Right rotation   Left rotation   (Blank rows = not tested)  MUSCLE LENGTH: Hamstrings: Right *** deg; Left *** deg Thomas test: Right *** deg; Left *** deg Hamstrings: *** ITB: *** Piriformis: *** Hip flexors: *** Quads: *** Heelcord: ***  LOWER EXTREMITY ROM:     Active  Right eval Left eval  Hip flexion    Hip extension    Hip abduction    Hip adduction    Hip internal rotation    Hip external rotation    Knee flexion    Knee extension    Ankle dorsiflexion    Ankle plantarflexion    Ankle inversion    Ankle eversion    (Blank rows = not tested)  LOWER EXTREMITY MMT:  MMT Right eval Left eval  Hip flexion    Hip extension    Hip abduction    Hip adduction    Hip internal rotation    Hip external rotation    Knee flexion    Knee extension    Ankle dorsiflexion    Ankle plantarflexion    Ankle inversion    Ankle eversion     (Blank rows = not tested)  LUMBAR SPECIAL TESTS:  {lumbar special test:25242}  FUNCTIONAL TESTS:  {Functional tests:24029}  GAIT: Distance walked: *** Assistive device utilized: {Assistive devices:23999} Level of assistance: {Levels of assistance:24026} Gait pattern: {gait characteristics:25376} Comments: ***   TODAY'S TREATMENT:   SELF CARE: Provided education on PT POC progression.; initial HEP    PATIENT EDUCATION:  Education details: PT eval findings, anticipated POC, and initial HEP  Person educated: Patient Education method: Explanation, Demonstration, Verbal cues, Tactile cues, Handouts, and MedBridgeGO app access provided Education comprehension: verbalized understanding, verbal cues required, tactile cues required, and needs further education  HOME EXERCISE PROGRAM: ***   ASSESSMENT:  CLINICAL  IMPRESSION: CATHERINE OAK is a 74 y.o. male who was referred to physical therapy for evaluation and treatment for lumbar fusion in 6/24.     Patient reports onset of *** pain beginning ***. Pain is worse with ***.  Patient has deficits in *** ROM, *** LE flexibility, *** strength, abnormal posture, and TTP with abnormal muscle tension *** which are interfering with ADLs and are impacting quality of life.  On Modified Oswestry patient scored ***/50 demonstrating ***% or *** disability.  Alphonzo Devera will benefit from skilled PT to address above deficits to improve mobility and activity tolerance with decreased pain interference.   ***  OBJECTIVE IMPAIRMENTS: {opptimpairments:25111}.   ACTIVITY LIMITATIONS: {activitylimitations:27494}  PARTICIPATION LIMITATIONS: {participationrestrictions:25113}  PERSONAL FACTORS: Age, Time since onset of injury/illness/exacerbation, and 1-2 comorbidities: Cognitive impairement with memory loss, CLL, HTN, anxiety, depression, insomnia, SIADH are also affecting patient's functional outcome.   REHAB POTENTIAL: Good  CLINICAL DECISION MAKING: Evolving/moderate complexity  EVALUATION COMPLEXITY: Moderate   GOALS: Goals reviewed with patient? Yes  SHORT TERM GOALS: Target date: ***  Patient will be independent with initial HEP to improve outcomes and carryover.  Baseline: 100% PT assist required for correct completion Goal status: INITIAL  2.  Patient will report 25% improvement in low back pain to improve QOL. Baseline: *** Goal status: INITIAL    LONG TERM GOALS: Target date: ***  Patient will be independent with ongoing/advanced HEP for self-management at home.  Baseline: no advanced HEP yet Goal status: IN PROGRESS  2.  Patient will report 50-75% improvement in low back pain to improve QOL.  Baseline: *** Goal status: INITIAL  3.  Patient to demonstrate ability to achieve and maintain good spinal alignment/posturing and body mechanics  needed for daily activities. Baseline: *** Goal status: INITIAL  4.  Patient will demonstrate full pain free lumbar ROM to perform ADLs.   Baseline: Refer to above lumbar ROM table Goal status: INITIAL  5.  Patient will demonstrate improved BLE strength to >/= 5/5 for improved stability and ease of mobility. Baseline: Refer to above LE MMT table Goal status: INITIAL  6. Patient will report </= ***% on Modified Oswestry (MCID = 12%) to demonstrate improved functional ability with decreased pain interference. Baseline: *** Goal status: INITIAL  7.  Patient will tolerate *** min of (standing/sitting/walking) w/o increased pain to allow for *** improved mobility and activity tolerance. Baseline: *** Goal status: {GOALSTATUS:25110}  PLAN:  PT FREQUENCY: 1-2x/week  PT DURATION: 8 weeks  PLANNED INTERVENTIONS: 97164- PT Re-evaluation, 97750- Physical Performance Testing, 97110-Therapeutic exercises, 97530- Therapeutic activity, [id number]- Neuromuscular re-education, 97535- Self Care, 02859- Manual therapy, [id number]- Electrical stimulation (unattended), 97035- Ultrasound, 79439 (1-2 muscles), 20561 (3+ muscles)- Dry Needling, Patient/Family education, Taping, Spinal mobilization, Cryotherapy, and Moist heat  PLAN FOR NEXT SESSION: PIERRETTE RED SENIOR, PT 12/26/2024, 1:46 PM  "

## 2024-12-30 ENCOUNTER — Ambulatory Visit: Admitting: Rehabilitation

## 2025-02-25 ENCOUNTER — Inpatient Hospital Stay: Admitting: Hematology & Oncology

## 2025-02-25 ENCOUNTER — Inpatient Hospital Stay

## 2025-03-02 ENCOUNTER — Encounter: Admitting: Internal Medicine

## 2025-09-30 ENCOUNTER — Ambulatory Visit
